# Patient Record
Sex: Female | Born: 1975 | Race: Black or African American | Hispanic: No | Marital: Single | Smoking: Never smoker
Health system: Southern US, Community
[De-identification: ages and names within clinical notes are randomized; demographics above are authoritative.]

## PROBLEM LIST (undated history)

## (undated) DIAGNOSIS — K4090 Unilateral inguinal hernia, without obstruction or gangrene, not specified as recurrent: Secondary | ICD-10-CM

## (undated) DIAGNOSIS — N159 Renal tubulo-interstitial disease, unspecified: Secondary | ICD-10-CM

## (undated) DIAGNOSIS — R51 Headache: Secondary | ICD-10-CM

## (undated) DIAGNOSIS — IMO0001 Reserved for inherently not codable concepts without codable children: Secondary | ICD-10-CM

## (undated) DIAGNOSIS — F419 Anxiety disorder, unspecified: Secondary | ICD-10-CM

## (undated) DIAGNOSIS — K219 Gastro-esophageal reflux disease without esophagitis: Secondary | ICD-10-CM

## (undated) DIAGNOSIS — J45909 Unspecified asthma, uncomplicated: Secondary | ICD-10-CM

## (undated) DIAGNOSIS — Z5189 Encounter for other specified aftercare: Secondary | ICD-10-CM

## (undated) DIAGNOSIS — G4730 Sleep apnea, unspecified: Secondary | ICD-10-CM

## (undated) DIAGNOSIS — D649 Anemia, unspecified: Secondary | ICD-10-CM

## (undated) DIAGNOSIS — R0602 Shortness of breath: Secondary | ICD-10-CM

## (undated) DIAGNOSIS — R7989 Other specified abnormal findings of blood chemistry: Secondary | ICD-10-CM

## (undated) DIAGNOSIS — F4310 Post-traumatic stress disorder, unspecified: Secondary | ICD-10-CM

## (undated) DIAGNOSIS — F32A Depression, unspecified: Secondary | ICD-10-CM

## (undated) DIAGNOSIS — J302 Other seasonal allergic rhinitis: Secondary | ICD-10-CM

## (undated) DIAGNOSIS — F329 Major depressive disorder, single episode, unspecified: Secondary | ICD-10-CM

## (undated) DIAGNOSIS — I10 Essential (primary) hypertension: Secondary | ICD-10-CM

## (undated) DIAGNOSIS — J189 Pneumonia, unspecified organism: Secondary | ICD-10-CM

## (undated) HISTORY — DX: Morbid (severe) obesity due to excess calories: E66.01

## (undated) HISTORY — DX: Essential (primary) hypertension: I10

## (undated) HISTORY — DX: Anemia, unspecified: D64.9

## (undated) HISTORY — DX: Other specified abnormal findings of blood chemistry: R79.89

## (undated) HISTORY — PX: CERVICAL CERCLAGE: SHX1329

## (undated) HISTORY — DX: Gastro-esophageal reflux disease without esophagitis: K21.9

---

## 2000-11-29 HISTORY — PX: TUBAL LIGATION: SHX77

## 2005-08-07 ENCOUNTER — Inpatient Hospital Stay (HOSPITAL_COMMUNITY): Admission: EM | Admit: 2005-08-07 | Discharge: 2005-08-09 | Payer: Self-pay | Admitting: Emergency Medicine

## 2005-09-10 ENCOUNTER — Emergency Department (HOSPITAL_COMMUNITY): Admission: EM | Admit: 2005-09-10 | Discharge: 2005-09-10 | Payer: Self-pay | Admitting: Emergency Medicine

## 2005-09-22 ENCOUNTER — Emergency Department (HOSPITAL_COMMUNITY): Admission: EM | Admit: 2005-09-22 | Discharge: 2005-09-22 | Payer: Self-pay | Admitting: Emergency Medicine

## 2005-10-05 ENCOUNTER — Emergency Department (HOSPITAL_COMMUNITY): Admission: EM | Admit: 2005-10-05 | Discharge: 2005-10-05 | Payer: Self-pay | Admitting: Emergency Medicine

## 2005-10-12 ENCOUNTER — Ambulatory Visit: Payer: Self-pay | Admitting: Pulmonary Disease

## 2005-10-12 ENCOUNTER — Inpatient Hospital Stay (HOSPITAL_COMMUNITY): Admission: EM | Admit: 2005-10-12 | Discharge: 2005-10-13 | Payer: Self-pay | Admitting: Emergency Medicine

## 2005-11-12 ENCOUNTER — Inpatient Hospital Stay (HOSPITAL_COMMUNITY): Admission: EM | Admit: 2005-11-12 | Discharge: 2005-11-14 | Payer: Self-pay | Admitting: Emergency Medicine

## 2005-12-16 ENCOUNTER — Emergency Department (HOSPITAL_COMMUNITY): Admission: EM | Admit: 2005-12-16 | Discharge: 2005-12-16 | Payer: Self-pay | Admitting: Emergency Medicine

## 2005-12-17 ENCOUNTER — Inpatient Hospital Stay (HOSPITAL_COMMUNITY): Admission: EM | Admit: 2005-12-17 | Discharge: 2005-12-19 | Payer: Self-pay | Admitting: Emergency Medicine

## 2006-01-10 ENCOUNTER — Ambulatory Visit: Payer: Self-pay | Admitting: Internal Medicine

## 2006-02-18 ENCOUNTER — Emergency Department (HOSPITAL_COMMUNITY): Admission: EM | Admit: 2006-02-18 | Discharge: 2006-02-18 | Payer: Self-pay | Admitting: Emergency Medicine

## 2006-02-26 ENCOUNTER — Emergency Department (HOSPITAL_COMMUNITY): Admission: EM | Admit: 2006-02-26 | Discharge: 2006-02-27 | Payer: Self-pay | Admitting: Emergency Medicine

## 2006-03-11 ENCOUNTER — Emergency Department (HOSPITAL_COMMUNITY): Admission: EM | Admit: 2006-03-11 | Discharge: 2006-03-11 | Payer: Self-pay | Admitting: Emergency Medicine

## 2006-03-12 ENCOUNTER — Ambulatory Visit: Payer: Self-pay | Admitting: Internal Medicine

## 2006-03-12 ENCOUNTER — Inpatient Hospital Stay (HOSPITAL_COMMUNITY): Admission: EM | Admit: 2006-03-12 | Discharge: 2006-03-14 | Payer: Self-pay | Admitting: Emergency Medicine

## 2006-04-16 ENCOUNTER — Emergency Department (HOSPITAL_COMMUNITY): Admission: EM | Admit: 2006-04-16 | Discharge: 2006-04-16 | Payer: Self-pay | Admitting: Emergency Medicine

## 2006-04-18 ENCOUNTER — Emergency Department (HOSPITAL_COMMUNITY): Admission: EM | Admit: 2006-04-18 | Discharge: 2006-04-18 | Payer: Self-pay | Admitting: Emergency Medicine

## 2006-05-03 ENCOUNTER — Encounter: Payer: Self-pay | Admitting: Emergency Medicine

## 2006-05-04 ENCOUNTER — Ambulatory Visit: Payer: Self-pay | Admitting: Critical Care Medicine

## 2006-05-04 ENCOUNTER — Inpatient Hospital Stay (HOSPITAL_COMMUNITY): Admission: EM | Admit: 2006-05-04 | Discharge: 2006-05-07 | Payer: Self-pay | Admitting: Emergency Medicine

## 2006-06-27 ENCOUNTER — Emergency Department (HOSPITAL_COMMUNITY): Admission: EM | Admit: 2006-06-27 | Discharge: 2006-06-27 | Payer: Self-pay | Admitting: Emergency Medicine

## 2006-06-28 ENCOUNTER — Inpatient Hospital Stay (HOSPITAL_COMMUNITY): Admission: EM | Admit: 2006-06-28 | Discharge: 2006-06-30 | Payer: Self-pay | Admitting: Emergency Medicine

## 2006-06-28 ENCOUNTER — Ambulatory Visit: Payer: Self-pay | Admitting: Internal Medicine

## 2006-07-07 ENCOUNTER — Ambulatory Visit: Payer: Self-pay | Admitting: Internal Medicine

## 2006-09-16 ENCOUNTER — Emergency Department (HOSPITAL_COMMUNITY): Admission: EM | Admit: 2006-09-16 | Discharge: 2006-09-16 | Payer: Self-pay | Admitting: Emergency Medicine

## 2006-09-17 ENCOUNTER — Observation Stay (HOSPITAL_COMMUNITY): Admission: EM | Admit: 2006-09-17 | Discharge: 2006-09-19 | Payer: Self-pay | Admitting: Emergency Medicine

## 2006-10-17 ENCOUNTER — Ambulatory Visit: Payer: Self-pay | Admitting: Internal Medicine

## 2006-10-17 ENCOUNTER — Inpatient Hospital Stay (HOSPITAL_COMMUNITY): Admission: EM | Admit: 2006-10-17 | Discharge: 2006-10-19 | Payer: Self-pay | Admitting: Emergency Medicine

## 2006-11-28 ENCOUNTER — Emergency Department (HOSPITAL_COMMUNITY): Admission: EM | Admit: 2006-11-28 | Discharge: 2006-11-28 | Payer: Self-pay | Admitting: Emergency Medicine

## 2006-11-29 DIAGNOSIS — N159 Renal tubulo-interstitial disease, unspecified: Secondary | ICD-10-CM

## 2006-11-29 HISTORY — DX: Renal tubulo-interstitial disease, unspecified: N15.9

## 2006-12-30 ENCOUNTER — Ambulatory Visit: Payer: Self-pay | Admitting: Internal Medicine

## 2007-01-01 ENCOUNTER — Emergency Department (HOSPITAL_COMMUNITY): Admission: EM | Admit: 2007-01-01 | Discharge: 2007-01-01 | Payer: Self-pay | Admitting: Emergency Medicine

## 2007-01-13 ENCOUNTER — Emergency Department (HOSPITAL_COMMUNITY): Admission: EM | Admit: 2007-01-13 | Discharge: 2007-01-13 | Payer: Self-pay | Admitting: Emergency Medicine

## 2007-02-25 ENCOUNTER — Emergency Department (HOSPITAL_COMMUNITY): Admission: EM | Admit: 2007-02-25 | Discharge: 2007-02-25 | Payer: Self-pay | Admitting: Emergency Medicine

## 2007-06-26 ENCOUNTER — Emergency Department (HOSPITAL_COMMUNITY): Admission: EM | Admit: 2007-06-26 | Discharge: 2007-06-26 | Payer: Self-pay | Admitting: Emergency Medicine

## 2007-06-27 ENCOUNTER — Emergency Department (HOSPITAL_COMMUNITY): Admission: EM | Admit: 2007-06-27 | Discharge: 2007-06-27 | Payer: Self-pay | Admitting: Emergency Medicine

## 2007-09-01 ENCOUNTER — Emergency Department (HOSPITAL_COMMUNITY): Admission: EM | Admit: 2007-09-01 | Discharge: 2007-09-01 | Payer: Self-pay | Admitting: Emergency Medicine

## 2007-11-01 ENCOUNTER — Emergency Department (HOSPITAL_COMMUNITY): Admission: EM | Admit: 2007-11-01 | Discharge: 2007-11-01 | Payer: Self-pay | Admitting: Emergency Medicine

## 2007-11-07 ENCOUNTER — Emergency Department (HOSPITAL_COMMUNITY): Admission: EM | Admit: 2007-11-07 | Discharge: 2007-11-07 | Payer: Self-pay | Admitting: Emergency Medicine

## 2007-11-08 ENCOUNTER — Emergency Department (HOSPITAL_COMMUNITY): Admission: EM | Admit: 2007-11-08 | Discharge: 2007-11-08 | Payer: Self-pay | Admitting: Emergency Medicine

## 2007-11-27 ENCOUNTER — Emergency Department (HOSPITAL_COMMUNITY): Admission: EM | Admit: 2007-11-27 | Discharge: 2007-11-27 | Payer: Self-pay | Admitting: Emergency Medicine

## 2008-04-09 ENCOUNTER — Emergency Department (HOSPITAL_COMMUNITY): Admission: EM | Admit: 2008-04-09 | Discharge: 2008-04-09 | Payer: Self-pay | Admitting: Emergency Medicine

## 2008-04-10 ENCOUNTER — Emergency Department (HOSPITAL_COMMUNITY): Admission: EM | Admit: 2008-04-10 | Discharge: 2008-04-10 | Payer: Self-pay | Admitting: Emergency Medicine

## 2008-04-28 ENCOUNTER — Emergency Department (HOSPITAL_COMMUNITY): Admission: EM | Admit: 2008-04-28 | Discharge: 2008-04-28 | Payer: Self-pay | Admitting: *Deleted

## 2008-04-29 ENCOUNTER — Emergency Department (HOSPITAL_COMMUNITY): Admission: EM | Admit: 2008-04-29 | Discharge: 2008-04-29 | Payer: Self-pay | Admitting: Emergency Medicine

## 2008-05-18 ENCOUNTER — Emergency Department (HOSPITAL_COMMUNITY): Admission: EM | Admit: 2008-05-18 | Discharge: 2008-05-18 | Payer: Self-pay | Admitting: Emergency Medicine

## 2008-07-08 ENCOUNTER — Encounter: Admission: RE | Admit: 2008-07-08 | Discharge: 2008-07-08 | Payer: Self-pay | Admitting: Otolaryngology

## 2008-07-19 ENCOUNTER — Ambulatory Visit (HOSPITAL_BASED_OUTPATIENT_CLINIC_OR_DEPARTMENT_OTHER): Admission: RE | Admit: 2008-07-19 | Discharge: 2008-07-19 | Payer: Self-pay | Admitting: Otolaryngology

## 2008-10-14 ENCOUNTER — Emergency Department (HOSPITAL_COMMUNITY): Admission: EM | Admit: 2008-10-14 | Discharge: 2008-10-14 | Payer: Self-pay | Admitting: Emergency Medicine

## 2008-10-20 ENCOUNTER — Emergency Department (HOSPITAL_COMMUNITY): Admission: EM | Admit: 2008-10-20 | Discharge: 2008-10-20 | Payer: Self-pay | Admitting: Emergency Medicine

## 2008-12-07 ENCOUNTER — Emergency Department (HOSPITAL_COMMUNITY): Admission: EM | Admit: 2008-12-07 | Discharge: 2008-12-07 | Payer: Self-pay | Admitting: Emergency Medicine

## 2008-12-13 ENCOUNTER — Emergency Department (HOSPITAL_COMMUNITY): Admission: EM | Admit: 2008-12-13 | Discharge: 2008-12-13 | Payer: Self-pay | Admitting: Emergency Medicine

## 2009-02-10 ENCOUNTER — Ambulatory Visit: Payer: Self-pay | Admitting: Internal Medicine

## 2009-02-17 ENCOUNTER — Emergency Department (HOSPITAL_COMMUNITY): Admission: EM | Admit: 2009-02-17 | Discharge: 2009-02-17 | Payer: Self-pay | Admitting: Emergency Medicine

## 2009-02-18 ENCOUNTER — Inpatient Hospital Stay (HOSPITAL_COMMUNITY): Admission: EM | Admit: 2009-02-18 | Discharge: 2009-02-21 | Payer: Self-pay | Admitting: Emergency Medicine

## 2009-02-18 ENCOUNTER — Ambulatory Visit: Payer: Self-pay | Admitting: Cardiovascular Disease

## 2009-02-19 ENCOUNTER — Encounter (INDEPENDENT_AMBULATORY_CARE_PROVIDER_SITE_OTHER): Payer: Self-pay | Admitting: Internal Medicine

## 2009-03-23 ENCOUNTER — Emergency Department (HOSPITAL_COMMUNITY): Admission: EM | Admit: 2009-03-23 | Discharge: 2009-03-23 | Payer: Self-pay | Admitting: Emergency Medicine

## 2009-03-24 ENCOUNTER — Inpatient Hospital Stay (HOSPITAL_COMMUNITY): Admission: EM | Admit: 2009-03-24 | Discharge: 2009-03-25 | Payer: Self-pay | Admitting: Emergency Medicine

## 2009-04-06 ENCOUNTER — Inpatient Hospital Stay (HOSPITAL_COMMUNITY): Admission: EM | Admit: 2009-04-06 | Discharge: 2009-04-09 | Payer: Self-pay | Admitting: Emergency Medicine

## 2009-05-20 ENCOUNTER — Encounter: Payer: Self-pay | Admitting: Emergency Medicine

## 2009-05-20 ENCOUNTER — Ambulatory Visit: Payer: Self-pay | Admitting: Interventional Radiology

## 2009-05-20 ENCOUNTER — Observation Stay (HOSPITAL_COMMUNITY): Admission: EM | Admit: 2009-05-20 | Discharge: 2009-05-21 | Payer: Self-pay | Admitting: Internal Medicine

## 2009-06-27 ENCOUNTER — Inpatient Hospital Stay (HOSPITAL_COMMUNITY): Admission: EM | Admit: 2009-06-27 | Discharge: 2009-06-30 | Payer: Self-pay | Admitting: Emergency Medicine

## 2009-09-06 ENCOUNTER — Emergency Department (HOSPITAL_COMMUNITY): Admission: EM | Admit: 2009-09-06 | Discharge: 2009-09-06 | Payer: Self-pay | Admitting: Emergency Medicine

## 2009-09-07 ENCOUNTER — Inpatient Hospital Stay (HOSPITAL_COMMUNITY): Admission: EM | Admit: 2009-09-07 | Discharge: 2009-09-09 | Payer: Self-pay | Admitting: Emergency Medicine

## 2010-11-07 ENCOUNTER — Emergency Department (HOSPITAL_COMMUNITY)
Admission: EM | Admit: 2010-11-07 | Discharge: 2010-11-07 | Payer: Self-pay | Source: Home / Self Care | Admitting: Emergency Medicine

## 2010-11-08 ENCOUNTER — Observation Stay (HOSPITAL_COMMUNITY)
Admission: EM | Admit: 2010-11-08 | Discharge: 2010-11-10 | Payer: Self-pay | Source: Home / Self Care | Attending: Internal Medicine | Admitting: Internal Medicine

## 2010-12-06 IMAGING — CR DG CHEST 2V
2 series · 2 of 2 positions shown · non-contrast
Comparison: 12/13/2008

CLINICAL DATA: Short of breath.  Cough.  Wheezing.  Asthma.

CHEST - 2 VIEW

[w chest pa]
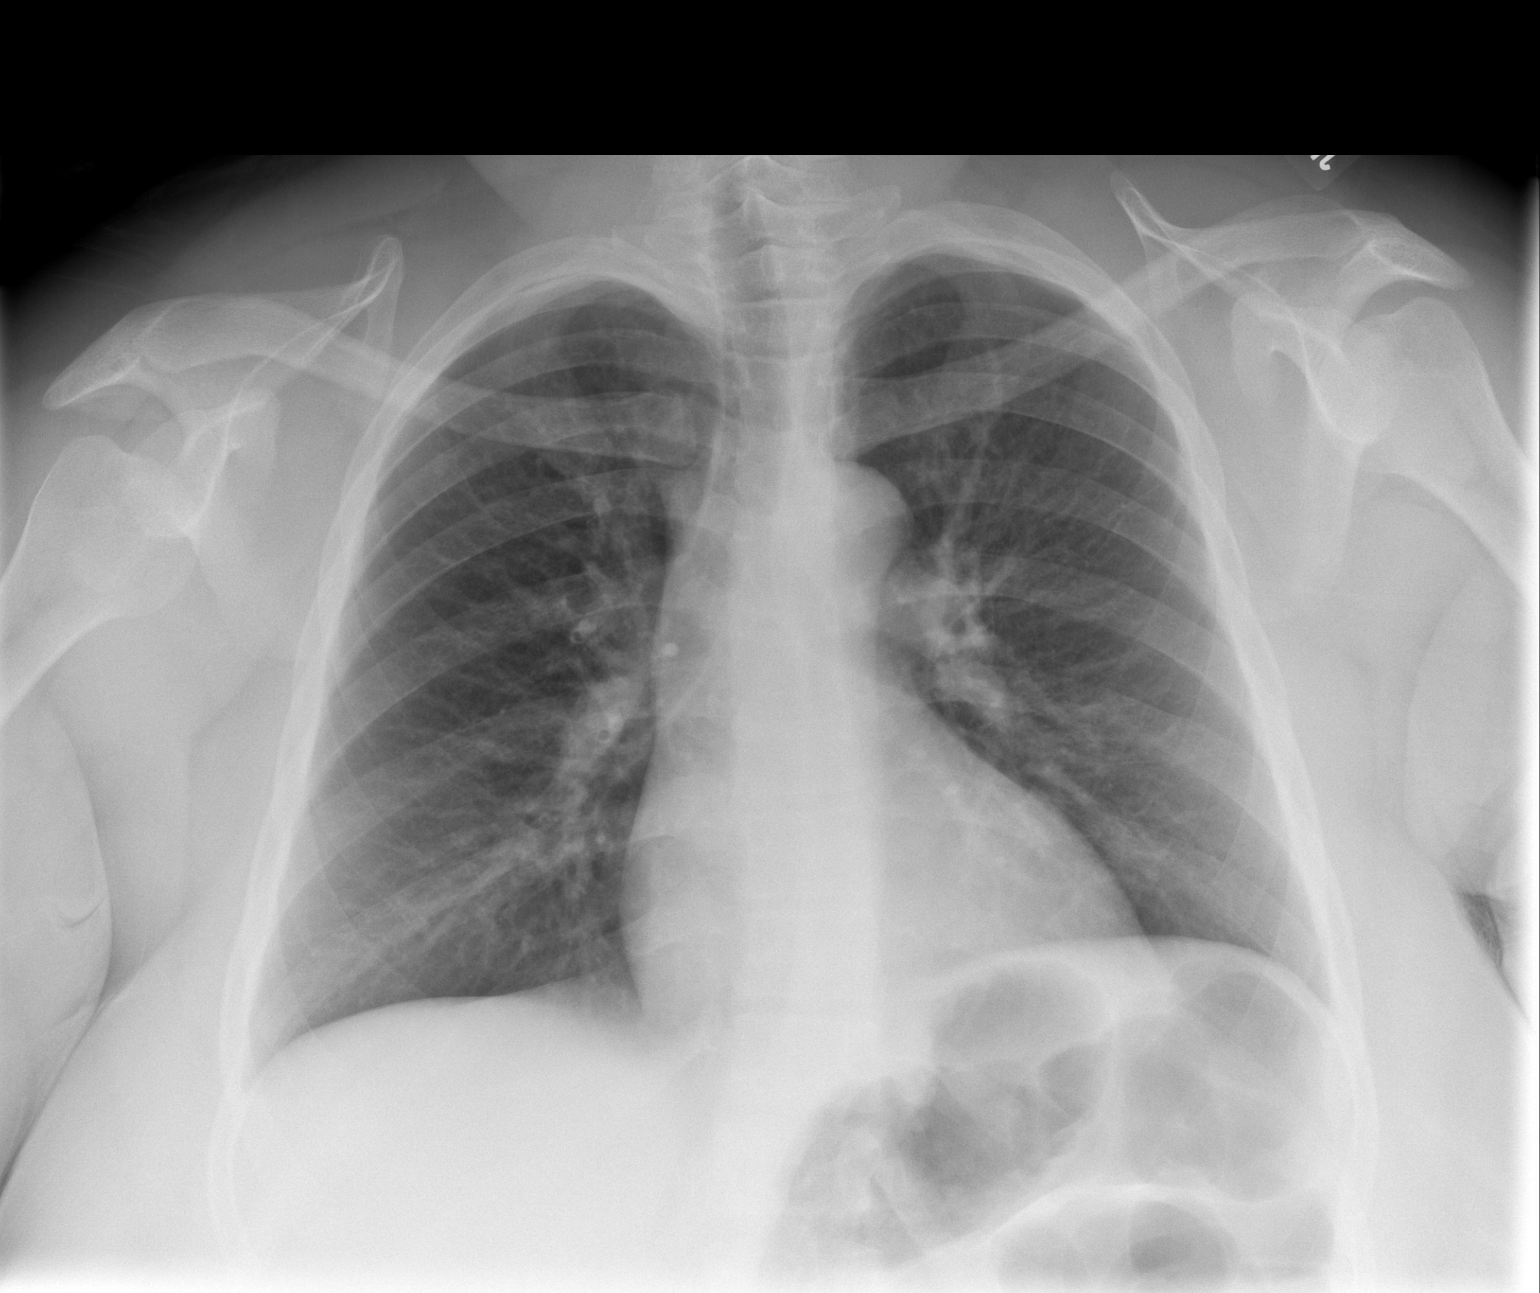

[w chest lat]
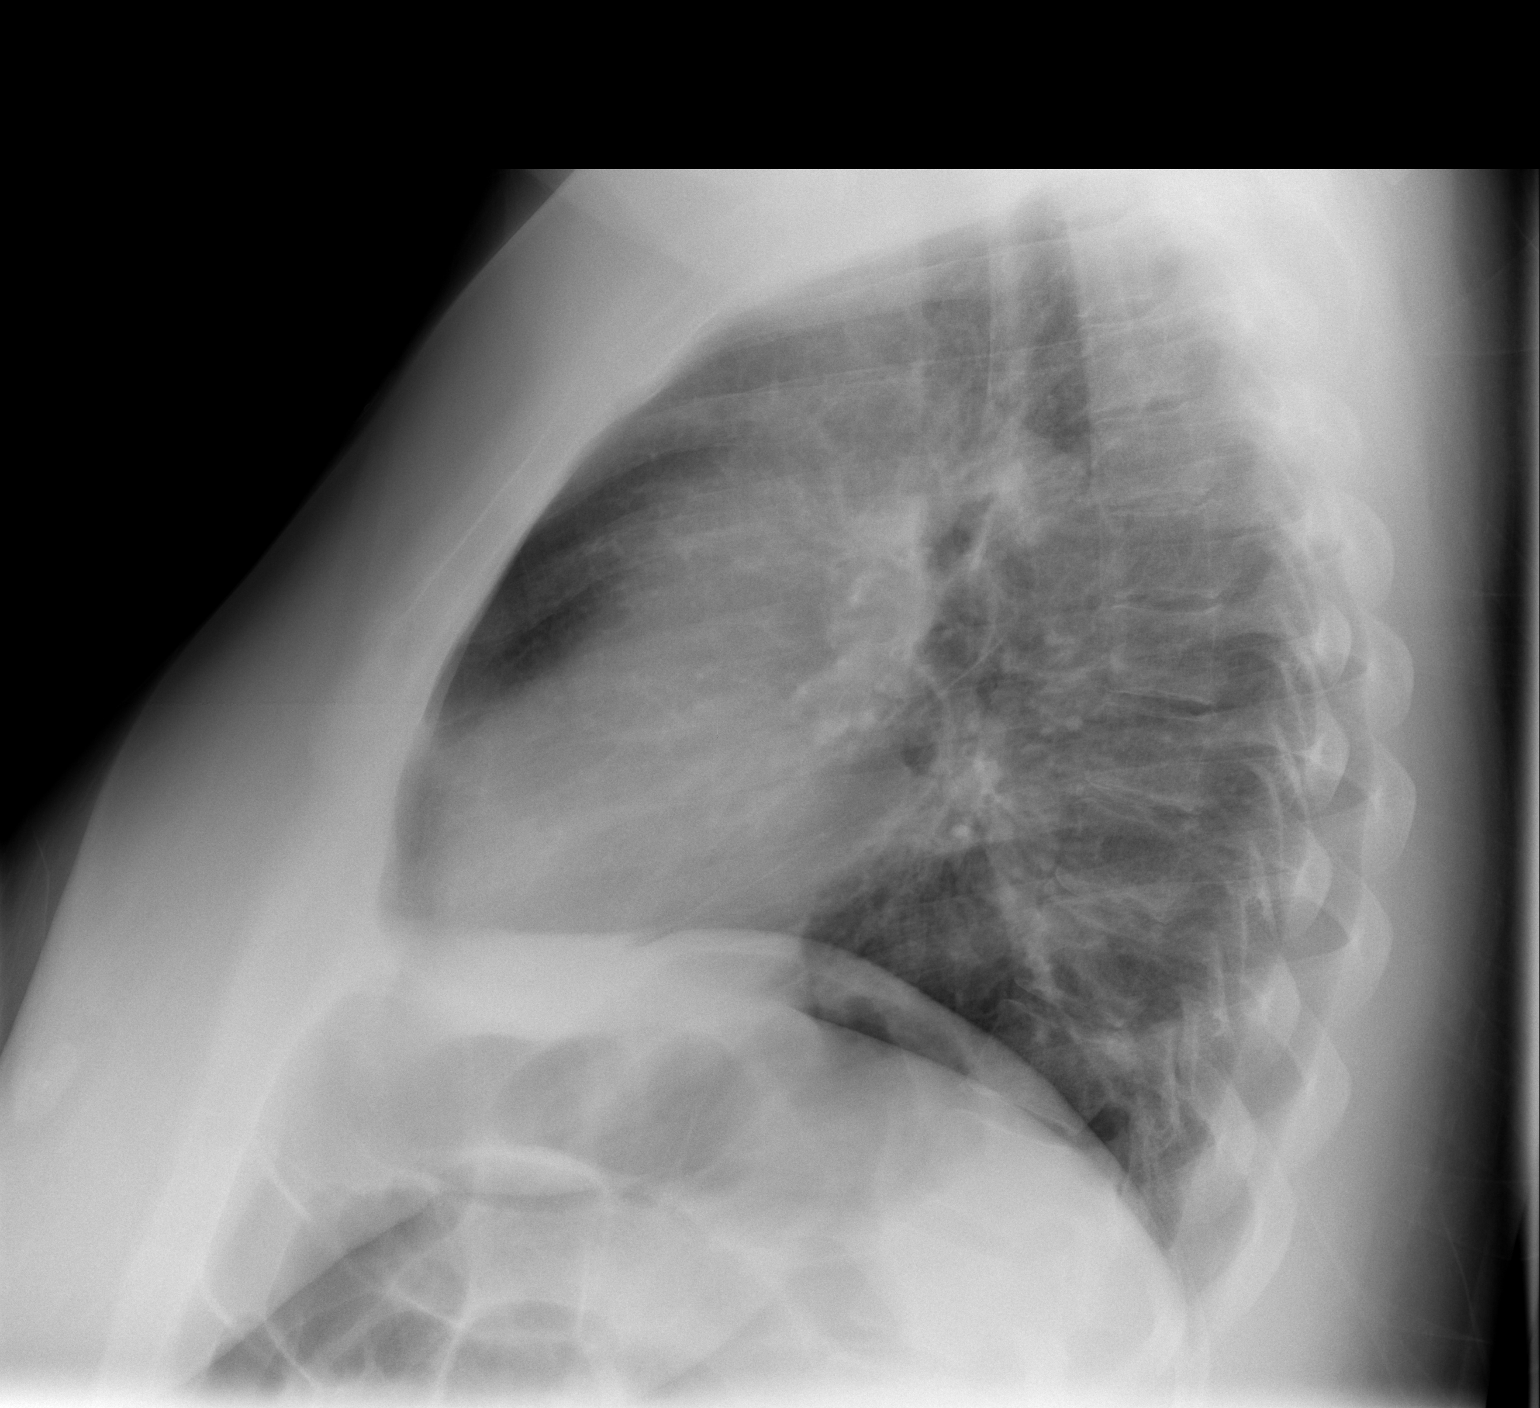

[2 of 2 positions shown; findings below may reference images not displayed]

FINDINGS: The heart size and mediastinal contours are within normal
limits.  Both lungs are clear.  The visualized skeletal structures
are unremarkable.
IMPRESSION: No active cardiopulmonary disease.

## 2010-12-21 ENCOUNTER — Encounter: Payer: Self-pay | Admitting: Otolaryngology

## 2010-12-27 ENCOUNTER — Emergency Department (HOSPITAL_COMMUNITY)
Admission: EM | Admit: 2010-12-27 | Discharge: 2010-12-27 | Payer: Self-pay | Source: Home / Self Care | Admitting: Emergency Medicine

## 2011-01-06 ENCOUNTER — Emergency Department (HOSPITAL_COMMUNITY)
Admission: EM | Admit: 2011-01-06 | Discharge: 2011-01-06 | Disposition: A | Discharge status: Home / Self Care | Payer: Medicare Other | Attending: Emergency Medicine | Admitting: Emergency Medicine

## 2011-01-06 DIAGNOSIS — J45901 Unspecified asthma with (acute) exacerbation: Secondary | ICD-10-CM | POA: Insufficient documentation

## 2011-01-06 DIAGNOSIS — E119 Type 2 diabetes mellitus without complications: Secondary | ICD-10-CM | POA: Insufficient documentation

## 2011-02-05 ENCOUNTER — Observation Stay (HOSPITAL_COMMUNITY)
Admission: EM | Admit: 2011-02-05 | Discharge: 2011-02-07 | Disposition: A | Discharge status: Home / Self Care | Payer: Medicare Other | Attending: Internal Medicine | Admitting: Internal Medicine

## 2011-02-05 ENCOUNTER — Emergency Department (HOSPITAL_COMMUNITY): Payer: Medicare Other

## 2011-02-05 DIAGNOSIS — I10 Essential (primary) hypertension: Secondary | ICD-10-CM | POA: Insufficient documentation

## 2011-02-05 DIAGNOSIS — D509 Iron deficiency anemia, unspecified: Secondary | ICD-10-CM | POA: Insufficient documentation

## 2011-02-05 DIAGNOSIS — R0789 Other chest pain: Secondary | ICD-10-CM | POA: Insufficient documentation

## 2011-02-05 DIAGNOSIS — E876 Hypokalemia: Secondary | ICD-10-CM | POA: Insufficient documentation

## 2011-02-05 DIAGNOSIS — R0902 Hypoxemia: Secondary | ICD-10-CM | POA: Insufficient documentation

## 2011-02-05 DIAGNOSIS — J45901 Unspecified asthma with (acute) exacerbation: Secondary | ICD-10-CM | POA: Insufficient documentation

## 2011-02-05 DIAGNOSIS — J209 Acute bronchitis, unspecified: Principal | ICD-10-CM | POA: Insufficient documentation

## 2011-02-05 DIAGNOSIS — E119 Type 2 diabetes mellitus without complications: Secondary | ICD-10-CM | POA: Insufficient documentation

## 2011-02-05 DIAGNOSIS — R0602 Shortness of breath: Secondary | ICD-10-CM | POA: Insufficient documentation

## 2011-02-05 LAB — CBC
HCT: 32.8 % — ABNORMAL LOW (ref 36.0–46.0)
Hemoglobin: 9.6 g/dL — ABNORMAL LOW (ref 12.0–15.0)
MCHC: 29.3 g/dL — ABNORMAL LOW (ref 30.0–36.0)
RBC: 4.9 MIL/uL (ref 3.87–5.11)

## 2011-02-05 LAB — DIFFERENTIAL
Basophils Absolute: 0 10*3/uL (ref 0.0–0.1)
Lymphocytes Relative: 20 % (ref 12–46)
Monocytes Relative: 9 % (ref 3–12)
Neutro Abs: 5.2 10*3/uL (ref 1.7–7.7)
Neutrophils Relative %: 67 % (ref 43–77)

## 2011-02-05 LAB — BASIC METABOLIC PANEL
CO2: 24 mEq/L (ref 19–32)
Calcium: 8.4 mg/dL (ref 8.4–10.5)
Chloride: 104 mEq/L (ref 96–112)
Glucose, Bld: 151 mg/dL — ABNORMAL HIGH (ref 70–99)
Potassium: 2.8 mEq/L — ABNORMAL LOW (ref 3.5–5.1)
Sodium: 136 mEq/L (ref 135–145)

## 2011-02-06 LAB — BASIC METABOLIC PANEL
CO2: 20 mEq/L (ref 19–32)
Chloride: 107 mEq/L (ref 96–112)
GFR calc Af Amer: >60 mL/min (ref >60)
Potassium: 3.5 mEq/L (ref 3.5–5.1)
Sodium: 138 mEq/L (ref 135–145)

## 2011-02-06 LAB — GLUCOSE, CAPILLARY
Glucose-Capillary: 253 mg/dL — ABNORMAL HIGH (ref 70–99)
Glucose-Capillary: 205 mg/dL — ABNORMAL HIGH (ref 70–99)
Glucose-Capillary: 248 mg/dL — ABNORMAL HIGH (ref 70–99)

## 2011-02-07 LAB — BASIC METABOLIC PANEL
CO2: 25 mEq/L (ref 19–32)
Chloride: 109 mEq/L (ref 96–112)
GFR calc Af Amer: >60 mL/min (ref >60)
Potassium: 3.9 mEq/L (ref 3.5–5.1)
Sodium: 139 mEq/L (ref 135–145)

## 2011-02-07 LAB — GLUCOSE, CAPILLARY
Glucose-Capillary: 124 mg/dL — ABNORMAL HIGH (ref 70–99)
Glucose-Capillary: 134 mg/dL — ABNORMAL HIGH (ref 70–99)

## 2011-02-08 LAB — PHOSPHORUS: Phosphorus: 2.4 mg/dL (ref 2.3–4.6)

## 2011-02-08 LAB — CBC
HCT: 30.4 % — ABNORMAL LOW (ref 36.0–46.0)
Hemoglobin: 9.1 g/dL — ABNORMAL LOW (ref 12.0–15.0)
Hemoglobin: 9.3 g/dL — ABNORMAL LOW (ref 12.0–15.0)
MCH: 20.7 pg — ABNORMAL LOW (ref 26.0–34.0)
MCHC: 29.1 g/dL — ABNORMAL LOW (ref 30.0–36.0)
MCHC: 29.9 g/dL — ABNORMAL LOW (ref 30.0–36.0)
MCV: 71.3 fL — ABNORMAL LOW (ref 78.0–100.0)
RBC: 4.49 MIL/uL (ref 3.87–5.11)
WBC: 8.1 10*3/uL (ref 4.0–10.5)

## 2011-02-08 LAB — BASIC METABOLIC PANEL
BUN: 8 mg/dL (ref 6–23)
BUN: 9 mg/dL (ref 6–23)
CO2: 26 mEq/L (ref 19–32)
Calcium: 8.8 mg/dL (ref 8.4–10.5)
Calcium: 9.4 mg/dL (ref 8.4–10.5)
Chloride: 105 mEq/L (ref 96–112)
Creatinine, Ser: 0.76 mg/dL (ref 0.4–1.2)
GFR calc Af Amer: >60 mL/min (ref >60)
GFR calc non Af Amer: >60 mL/min (ref >60)
GFR calc non Af Amer: >60 mL/min (ref >60)
Glucose, Bld: 228 mg/dL — ABNORMAL HIGH (ref 70–99)
Glucose, Bld: 290 mg/dL — ABNORMAL HIGH (ref 70–99)
Potassium: 2.9 mEq/L — ABNORMAL LOW (ref 3.5–5.1)
Potassium: 4.2 mEq/L (ref 3.5–5.1)
Potassium: 4.6 mEq/L (ref 3.5–5.1)
Sodium: 141 mEq/L (ref 135–145)

## 2011-02-08 LAB — IRON AND TIBC: UIBC: 373 ug/dL

## 2011-02-08 LAB — DIFFERENTIAL
Basophils Absolute: 0 10*3/uL (ref 0.0–0.1)
Basophils Relative: 0 % (ref 0–1)
Eosinophils Absolute: 0.1 10*3/uL (ref 0.0–0.7)
Lymphocytes Relative: 6 % — ABNORMAL LOW (ref 12–46)
Monocytes Relative: 2 % — ABNORMAL LOW (ref 3–12)
Neutro Abs: 7.3 10*3/uL (ref 1.7–7.7)
Neutrophils Relative %: 91 % — ABNORMAL HIGH (ref 43–77)

## 2011-02-08 LAB — GLUCOSE, CAPILLARY: Glucose-Capillary: 295 mg/dL — ABNORMAL HIGH (ref 70–99)

## 2011-02-08 LAB — TSH: TSH: 0.289 u[IU]/mL — ABNORMAL LOW (ref 0.350–4.500)

## 2011-02-08 LAB — FERRITIN: Ferritin: 3 ng/mL — ABNORMAL LOW (ref 10–291)

## 2011-02-08 LAB — POCT PREGNANCY, URINE: Preg Test, Ur: NEGATIVE

## 2011-02-08 LAB — HEMOGLOBIN A1C: Hgb A1c MFr Bld: 6.9 % — ABNORMAL HIGH (ref <5.7)

## 2011-02-08 LAB — MAGNESIUM: Magnesium: 1.8 mg/dL (ref 1.5–2.5)

## 2011-02-08 LAB — FOLATE: Folate: 11.2 ng/mL

## 2011-02-08 LAB — T4, FREE: Free T4: 0.82 ng/dL (ref 0.80–1.80)

## 2011-02-23 ENCOUNTER — Observation Stay (HOSPITAL_COMMUNITY)
Admission: EM | Admit: 2011-02-23 | Discharge: 2011-02-23 | Disposition: A | Discharge status: Home / Self Care | Payer: Medicare Other | Attending: Emergency Medicine | Admitting: Emergency Medicine

## 2011-02-23 DIAGNOSIS — J45909 Unspecified asthma, uncomplicated: Principal | ICD-10-CM | POA: Insufficient documentation

## 2011-03-04 LAB — CULTURE, BLOOD (ROUTINE X 2)
Culture: NO GROWTH
Culture: NO GROWTH

## 2011-03-04 LAB — CBC
HCT: 34.7 % — ABNORMAL LOW (ref 36.0–46.0)
HCT: 35.6 % — ABNORMAL LOW (ref 36.0–46.0)
Hemoglobin: 11.8 g/dL — ABNORMAL LOW (ref 12.0–15.0)
Hemoglobin: 12 g/dL (ref 12.0–15.0)
MCHC: 33.8 g/dL (ref 30.0–36.0)
MCHC: 34 g/dL (ref 30.0–36.0)
MCV: 81.5 fL (ref 78.0–100.0)
MCV: 82.6 fL (ref 78.0–100.0)
MCV: 84.4 fL (ref 78.0–100.0)
RBC: 4.17 MIL/uL (ref 3.87–5.11)
RBC: 4.25 MIL/uL (ref 3.87–5.11)
RDW: 19.8 % — ABNORMAL HIGH (ref 11.5–15.5)
RDW: 19.9 % — ABNORMAL HIGH (ref 11.5–15.5)
WBC: 11.4 10*3/uL — ABNORMAL HIGH (ref 4.0–10.5)

## 2011-03-04 LAB — BLOOD GAS, ARTERIAL
Bicarbonate: 20.6 mEq/L (ref 20.0–24.0)
O2 Saturation: 94.7 %
Patient temperature: 98.6
TCO2: 18.6 mmol/L (ref 0–100)

## 2011-03-04 LAB — URINALYSIS, MICROSCOPIC ONLY
Bilirubin Urine: NEGATIVE
Nitrite: NEGATIVE
Protein, ur: NEGATIVE mg/dL
Urobilinogen, UA: 0.2 mg/dL (ref 0.0–1.0)

## 2011-03-04 LAB — GLUCOSE, CAPILLARY
Glucose-Capillary: 106 mg/dL — ABNORMAL HIGH (ref 70–99)
Glucose-Capillary: 155 mg/dL — ABNORMAL HIGH (ref 70–99)

## 2011-03-04 LAB — EXPECTORATED SPUTUM ASSESSMENT W GRAM STAIN, RFLX TO RESP C

## 2011-03-04 LAB — LIPID PANEL
Cholesterol: 171 mg/dL (ref 0–200)
HDL: 69 mg/dL (ref >39)
LDL Cholesterol: 94 mg/dL (ref 0–99)
Total CHOL/HDL Ratio: 2.5 RATIO
Triglycerides: 42 mg/dL (ref <150)

## 2011-03-04 LAB — APTT: aPTT: 28 seconds (ref 24–37)

## 2011-03-04 LAB — BASIC METABOLIC PANEL
CO2: 22 mEq/L (ref 19–32)
Calcium: 8.9 mg/dL (ref 8.4–10.5)
Chloride: 108 mEq/L (ref 96–112)
Chloride: 108 mEq/L (ref 96–112)
Creatinine, Ser: 0.71 mg/dL (ref 0.4–1.2)
GFR calc Af Amer: >60 mL/min (ref >60)
GFR calc Af Amer: >60 mL/min (ref >60)
GFR calc non Af Amer: >60 mL/min (ref >60)
Glucose, Bld: 148 mg/dL — ABNORMAL HIGH (ref 70–99)
Sodium: 139 mEq/L (ref 135–145)

## 2011-03-04 LAB — POCT I-STAT, CHEM 8
Calcium, Ion: 1.1 mmol/L — ABNORMAL LOW (ref 1.12–1.32)
Chloride: 108 mEq/L (ref 96–112)
Glucose, Bld: 148 mg/dL — ABNORMAL HIGH (ref 70–99)
HCT: 38 % (ref 36.0–46.0)
TCO2: 21 mmol/L (ref 0–100)

## 2011-03-04 LAB — COMPREHENSIVE METABOLIC PANEL
Alkaline Phosphatase: 72 U/L (ref 39–117)
BUN: 6 mg/dL (ref 6–23)
Calcium: 8.8 mg/dL (ref 8.4–10.5)
Glucose, Bld: 235 mg/dL — ABNORMAL HIGH (ref 70–99)
Total Protein: 6.8 g/dL (ref 6.0–8.3)

## 2011-03-04 LAB — PHOSPHORUS
Phosphorus: 1.7 mg/dL — ABNORMAL LOW (ref 2.3–4.6)
Phosphorus: 2.1 mg/dL — ABNORMAL LOW (ref 2.3–4.6)

## 2011-03-04 LAB — DIFFERENTIAL
Basophils Absolute: 0.1 10*3/uL (ref 0.0–0.1)
Basophils Relative: 1 % (ref 0–1)
Eosinophils Absolute: 0 10*3/uL (ref 0.0–0.7)
Eosinophils Relative: 0 % (ref 0–5)
Monocytes Absolute: 0.1 10*3/uL (ref 0.1–1.0)
Monocytes Relative: 1 % — ABNORMAL LOW (ref 3–12)
Neutro Abs: 11.7 10*3/uL — ABNORMAL HIGH (ref 1.7–7.7)

## 2011-03-04 LAB — CULTURE, RESPIRATORY W GRAM STAIN

## 2011-03-04 LAB — MAGNESIUM: Magnesium: 2.1 mg/dL (ref 1.5–2.5)

## 2011-03-06 LAB — CBC
HCT: 32.6 % — ABNORMAL LOW (ref 36.0–46.0)
Hemoglobin: 10.5 g/dL — ABNORMAL LOW (ref 12.0–15.0)
MCHC: 32.3 g/dL (ref 30.0–36.0)
Platelets: 263 10*3/uL (ref 150–400)
RBC: 4.26 MIL/uL (ref 3.87–5.11)
RBC: 4.28 MIL/uL (ref 3.87–5.11)
RDW: 16.8 % — ABNORMAL HIGH (ref 11.5–15.5)
WBC: 16.7 10*3/uL — ABNORMAL HIGH (ref 4.0–10.5)

## 2011-03-06 LAB — GLUCOSE, CAPILLARY
Glucose-Capillary: 196 mg/dL — ABNORMAL HIGH (ref 70–99)
Glucose-Capillary: 249 mg/dL — ABNORMAL HIGH (ref 70–99)

## 2011-03-06 LAB — BASIC METABOLIC PANEL
BUN: 6 mg/dL (ref 6–23)
GFR calc Af Amer: >60 mL/min (ref >60)
GFR calc non Af Amer: >60 mL/min (ref >60)
Potassium: 3.8 mEq/L (ref 3.5–5.1)
Sodium: 136 mEq/L (ref 135–145)

## 2011-03-07 ENCOUNTER — Emergency Department (HOSPITAL_COMMUNITY)
Admission: EM | Admit: 2011-03-07 | Discharge: 2011-03-07 | Disposition: A | Discharge status: Home / Self Care | Payer: Medicare Other | Attending: Emergency Medicine | Admitting: Emergency Medicine

## 2011-03-07 DIAGNOSIS — R059 Cough, unspecified: Secondary | ICD-10-CM | POA: Insufficient documentation

## 2011-03-07 DIAGNOSIS — R05 Cough: Secondary | ICD-10-CM | POA: Insufficient documentation

## 2011-03-07 DIAGNOSIS — I10 Essential (primary) hypertension: Secondary | ICD-10-CM | POA: Insufficient documentation

## 2011-03-07 DIAGNOSIS — J45901 Unspecified asthma with (acute) exacerbation: Secondary | ICD-10-CM | POA: Insufficient documentation

## 2011-03-07 DIAGNOSIS — E119 Type 2 diabetes mellitus without complications: Secondary | ICD-10-CM | POA: Insufficient documentation

## 2011-03-07 DIAGNOSIS — K219 Gastro-esophageal reflux disease without esophagitis: Secondary | ICD-10-CM | POA: Insufficient documentation

## 2011-03-07 LAB — CBC
HCT: 32.7 % — ABNORMAL LOW (ref 36.0–46.0)
MCV: 76 fL — ABNORMAL LOW (ref 78.0–100.0)
MCV: 76.4 fL — ABNORMAL LOW (ref 78.0–100.0)
Platelets: 330 10*3/uL (ref 150–400)
RBC: 4.31 MIL/uL (ref 3.87–5.11)
RDW: 16.8 % — ABNORMAL HIGH (ref 11.5–15.5)
WBC: 12.8 10*3/uL — ABNORMAL HIGH (ref 4.0–10.5)
WBC: 7.5 10*3/uL (ref 4.0–10.5)

## 2011-03-07 LAB — BLOOD GAS, ARTERIAL
Acid-base deficit: 0.2 mmol/L (ref 0.0–2.0)
Bicarbonate: 23.6 mEq/L (ref 20.0–24.0)
Drawn by: 246861
Drawn by: 307971
FIO2: 0.4 %
FIO2: 0.5 %
O2 Saturation: 98.4 %
PEEP: 5 cmH2O
Patient temperature: 98.6
Pressure support: 5 cmH2O
TCO2: 25.5 mmol/L (ref 0–100)
pCO2 arterial: 41.8 mmHg (ref 35.0–45.0)
pCO2 arterial: 43.9 mmHg (ref 35.0–45.0)
pH, Arterial: 7.381 (ref 7.350–7.400)
pO2, Arterial: 117 mmHg — ABNORMAL HIGH (ref 80.0–100.0)

## 2011-03-07 LAB — DIFFERENTIAL
Eosinophils Absolute: 0.5 10*3/uL (ref 0.0–0.7)
Eosinophils Relative: 7 % — ABNORMAL HIGH (ref 0–5)
Lymphs Abs: 0.6 10*3/uL — ABNORMAL LOW (ref 0.7–4.0)
Monocytes Relative: 1 % — ABNORMAL LOW (ref 3–12)

## 2011-03-07 LAB — BASIC METABOLIC PANEL
BUN: 7 mg/dL (ref 6–23)
Creatinine, Ser: 0.79 mg/dL (ref 0.4–1.2)
GFR calc non Af Amer: >60 mL/min (ref >60)
Glucose, Bld: 196 mg/dL — ABNORMAL HIGH (ref 70–99)

## 2011-03-07 LAB — RETICULOCYTES
RBC.: 4.76 MIL/uL (ref 3.87–5.11)
Retic Count, Absolute: 100 10*3/uL (ref 19.0–186.0)
Retic Ct Pct: 2.1 % (ref 0.4–3.1)

## 2011-03-07 LAB — HEMOGLOBIN A1C
Hgb A1c MFr Bld: 6.1 % (ref 4.6–6.1)
Mean Plasma Glucose: 128 mg/dL

## 2011-03-07 LAB — POCT I-STAT, CHEM 8
BUN: 6 mg/dL (ref 6–23)
Calcium, Ion: 1.15 mmol/L (ref 1.12–1.32)
Creatinine, Ser: 0.8 mg/dL (ref 0.4–1.2)
TCO2: 24 mmol/L (ref 0–100)

## 2011-03-07 LAB — FERRITIN: Ferritin: 7 ng/mL — ABNORMAL LOW (ref 10–291)

## 2011-03-07 LAB — IRON AND TIBC: UIBC: 432 ug/dL

## 2011-03-08 ENCOUNTER — Emergency Department (HOSPITAL_COMMUNITY): Payer: Medicare Other

## 2011-03-08 ENCOUNTER — Inpatient Hospital Stay (HOSPITAL_COMMUNITY)
Admission: EM | Admit: 2011-03-08 | Discharge: 2011-03-12 | DRG: 189 | Disposition: A | Discharge status: Home / Self Care | Payer: Medicare Other | Attending: Internal Medicine | Admitting: Internal Medicine

## 2011-03-08 DIAGNOSIS — R0902 Hypoxemia: Secondary | ICD-10-CM | POA: Diagnosis present

## 2011-03-08 DIAGNOSIS — J441 Chronic obstructive pulmonary disease with (acute) exacerbation: Secondary | ICD-10-CM | POA: Diagnosis present

## 2011-03-08 DIAGNOSIS — I10 Essential (primary) hypertension: Secondary | ICD-10-CM | POA: Diagnosis present

## 2011-03-08 DIAGNOSIS — K219 Gastro-esophageal reflux disease without esophagitis: Secondary | ICD-10-CM | POA: Diagnosis present

## 2011-03-08 DIAGNOSIS — J9600 Acute respiratory failure, unspecified whether with hypoxia or hypercapnia: Principal | ICD-10-CM | POA: Diagnosis present

## 2011-03-08 DIAGNOSIS — D509 Iron deficiency anemia, unspecified: Secondary | ICD-10-CM | POA: Diagnosis present

## 2011-03-08 DIAGNOSIS — E876 Hypokalemia: Secondary | ICD-10-CM | POA: Diagnosis present

## 2011-03-08 DIAGNOSIS — IMO0001 Reserved for inherently not codable concepts without codable children: Secondary | ICD-10-CM | POA: Diagnosis present

## 2011-03-08 LAB — BLOOD GAS, ARTERIAL
Acid-base deficit: 2.4 mmol/L — ABNORMAL HIGH (ref 0.0–2.0)
Acid-base deficit: 1.7 mmol/L (ref 0.0–2.0)
Bicarbonate: 17.1 mEq/L — ABNORMAL LOW (ref 20.0–24.0)
Bicarbonate: 22 mEq/L (ref 20.0–24.0)
Delivery systems: POSITIVE
FIO2: 0.4 %
O2 Saturation: 96.6 %
O2 Saturation: 97.9 %
Patient temperature: 98.6
Patient temperature: 98.6
Patient temperature: 98.6
TCO2: 15.8 mmol/L (ref 0–100)
TCO2: 22 mmol/L (ref 0–100)
pCO2 arterial: 32.7 mmHg — ABNORMAL LOW (ref 35.0–45.0)
pH, Arterial: 7.34 — ABNORMAL LOW (ref 7.350–7.400)
pH, Arterial: 7.356 (ref 7.350–7.400)
pO2, Arterial: 73.8 mmHg — ABNORMAL LOW (ref 80.0–100.0)
pO2, Arterial: 87.2 mmHg (ref 80.0–100.0)

## 2011-03-08 LAB — HEMOGLOBIN A1C: Mean Plasma Glucose: 111 mg/dL

## 2011-03-08 LAB — BASIC METABOLIC PANEL
CO2: 18 mEq/L — ABNORMAL LOW (ref 19–32)
CO2: 23 mEq/L (ref 19–32)
CO2: 25 mEq/L (ref 19–32)
CO2: 22 mEq/L (ref 19–32)
Calcium: 9 mg/dL (ref 8.4–10.5)
Calcium: 9 mg/dL (ref 8.4–10.5)
Chloride: 112 mEq/L (ref 96–112)
Chloride: 112 mEq/L (ref 96–112)
Chloride: 106 mEq/L (ref 96–112)
Creatinine, Ser: 0.97 mg/dL (ref 0.4–1.2)
Creatinine, Ser: 0.9 mg/dL (ref 0.4–1.2)
GFR calc Af Amer: >60 mL/min (ref >60)
GFR calc Af Amer: >60 mL/min (ref >60)
GFR calc Af Amer: >60 mL/min (ref >60)
GFR calc non Af Amer: >60 mL/min (ref >60)
Glucose, Bld: 244 mg/dL — ABNORMAL HIGH (ref 70–99)
Potassium: 4.5 mEq/L (ref 3.5–5.1)
Potassium: 3.1 mEq/L — ABNORMAL LOW (ref 3.5–5.1)
Sodium: 139 mEq/L (ref 135–145)
Sodium: 140 mEq/L (ref 135–145)
Sodium: 140 mEq/L (ref 135–145)

## 2011-03-08 LAB — DIFFERENTIAL
Basophils Absolute: 0 10*3/uL (ref 0.0–0.1)
Eosinophils Absolute: 0.7 10*3/uL (ref 0.0–0.7)
Lymphocytes Relative: 28 % (ref 12–46)
Lymphs Abs: 2.2 10*3/uL (ref 0.7–4.0)
Lymphs Abs: 1.8 10*3/uL (ref 0.7–4.0)
Monocytes Relative: 10 % (ref 3–12)
Neutrophils Relative %: 58 % (ref 43–77)

## 2011-03-08 LAB — CBC
HCT: 36.3 % (ref 36.0–46.0)
Hemoglobin: 8.7 g/dL — ABNORMAL LOW (ref 12.0–15.0)
MCHC: 32.1 g/dL (ref 30.0–36.0)
MCHC: 28.1 g/dL — ABNORMAL LOW (ref 30.0–36.0)
MCV: 80.3 fL (ref 78.0–100.0)
Platelets: 259 10*3/uL (ref 150–400)
Platelets: 379 10*3/uL (ref 150–400)
RBC: 4.53 MIL/uL (ref 3.87–5.11)
RDW: 18.3 % — ABNORMAL HIGH (ref 11.5–15.5)
WBC: 17.4 10*3/uL — ABNORMAL HIGH (ref 4.0–10.5)
WBC: 8.1 10*3/uL (ref 4.0–10.5)

## 2011-03-08 LAB — GLUCOSE, CAPILLARY
Glucose-Capillary: 226 mg/dL — ABNORMAL HIGH (ref 70–99)
Glucose-Capillary: 237 mg/dL — ABNORMAL HIGH (ref 70–99)
Glucose-Capillary: 238 mg/dL — ABNORMAL HIGH (ref 70–99)
Glucose-Capillary: 257 mg/dL — ABNORMAL HIGH (ref 70–99)

## 2011-03-08 LAB — HEPARIN ANTIBODY SCREEN: Heparin Antibody Screen: NEGATIVE

## 2011-03-08 LAB — CARDIAC PANEL(CRET KIN+CKTOT+MB+TROPI)
CK, MB: 1.3 ng/mL (ref 0.3–4.0)
CK, MB: 5 ng/mL — ABNORMAL HIGH (ref 0.3–4.0)
CK, MB: 7.8 ng/mL — ABNORMAL HIGH (ref 0.3–4.0)
Total CK: 101 U/L (ref 7–177)
Total CK: 297 U/L — ABNORMAL HIGH (ref 7–177)
Troponin I: 0.02 ng/mL (ref 0.00–0.06)
Troponin I: <0.01 ng/mL (ref 0.00–0.06)

## 2011-03-08 LAB — POCT B-TYPE NATRIURETIC PEPTIDE (BNP): B Natriuretic Peptide, POC: <5 pg/mL (ref 0–100)

## 2011-03-09 ENCOUNTER — Inpatient Hospital Stay (HOSPITAL_COMMUNITY): Payer: Medicare Other

## 2011-03-09 LAB — COMPREHENSIVE METABOLIC PANEL
AST: 29 U/L (ref 0–37)
Albumin: 3.5 g/dL (ref 3.5–5.2)
BUN: 8 mg/dL (ref 6–23)
CO2: 24 mEq/L (ref 19–32)
Chloride: 107 mEq/L (ref 96–112)
Creatinine, Ser: 0.93 mg/dL (ref 0.4–1.2)
Creatinine, Ser: 0.87 mg/dL (ref 0.4–1.2)
GFR calc Af Amer: >60 mL/min (ref >60)
GFR calc non Af Amer: >60 mL/min (ref >60)
Glucose, Bld: 240 mg/dL — ABNORMAL HIGH (ref 70–99)
Potassium: 3.6 mEq/L (ref 3.5–5.1)
Total Bilirubin: 0.4 mg/dL (ref 0.3–1.2)
Total Protein: 6.2 g/dL (ref 6.0–8.3)

## 2011-03-09 LAB — CBC
HCT: 29.8 % — ABNORMAL LOW (ref 36.0–46.0)
HCT: 36.4 % (ref 36.0–46.0)
HCT: 37 % (ref 36.0–46.0)
Hemoglobin: 8.4 g/dL — ABNORMAL LOW (ref 12.0–15.0)
Hemoglobin: 12.2 g/dL (ref 12.0–15.0)
MCH: 19.3 pg — ABNORMAL LOW (ref 26.0–34.0)
MCHC: 33.5 g/dL (ref 30.0–36.0)
MCV: 68.3 fL — ABNORMAL LOW (ref 78.0–100.0)
MCV: 73.6 fL — ABNORMAL LOW (ref 78.0–100.0)
MCV: 74.6 fL — ABNORMAL LOW (ref 78.0–100.0)
Platelets: 269 K/uL (ref 150–400)
Platelets: 295 10*3/uL (ref 150–400)
RBC: 4.36 MIL/uL (ref 3.87–5.11)
RBC: 4.95 MIL/uL (ref 3.87–5.11)
RDW: 31.1 % — ABNORMAL HIGH (ref 11.5–15.5)
RDW: 31.2 % — ABNORMAL HIGH (ref 11.5–15.5)
WBC: 9.9 K/uL (ref 4.0–10.5)

## 2011-03-09 LAB — BASIC METABOLIC PANEL WITH GFR
BUN: 11 mg/dL (ref 6–23)
CO2: 28 meq/L (ref 19–32)
Chloride: 107 meq/L (ref 96–112)
Creatinine, Ser: 0.67 mg/dL (ref 0.4–1.2)
Potassium: 3.6 meq/L (ref 3.5–5.1)

## 2011-03-09 LAB — CARDIAC PANEL(CRET KIN+CKTOT+MB+TROPI)
Relative Index: 1.7 (ref 0.0–2.5)
Relative Index: 2.1 (ref 0.0–2.5)
Troponin I: 0.01 ng/mL (ref 0.00–0.06)

## 2011-03-09 LAB — BLOOD GAS, ARTERIAL
Acid-base deficit: 2.1 mmol/L — ABNORMAL HIGH (ref 0.0–2.0)
Delivery systems: POSITIVE
Drawn by: 308601
Inspiratory PAP: 15
O2 Saturation: 99.4 %

## 2011-03-09 LAB — DIFFERENTIAL
Basophils Absolute: 0.4 K/uL — ABNORMAL HIGH (ref 0.0–0.1)
Basophils Relative: 0 % (ref 0–1)
Basophils Relative: 4 % — ABNORMAL HIGH (ref 0–1)
Eosinophils Absolute: 1.1 10*3/uL — ABNORMAL HIGH (ref 0.0–0.7)
Eosinophils Relative: 0 % (ref 0–5)
Eosinophils Relative: 11 % — ABNORMAL HIGH (ref 0–5)
Lymphocytes Relative: 29 % (ref 12–46)
Lymphocytes Relative: 5 % — ABNORMAL LOW (ref 12–46)
Lymphs Abs: 2.9 K/uL (ref 0.7–4.0)
Monocytes Absolute: 0.5 10*3/uL (ref 0.1–1.0)
Monocytes Relative: 0 % — ABNORMAL LOW (ref 3–12)
Monocytes Relative: 5 % (ref 3–12)
Neutro Abs: 12.4 10*3/uL — ABNORMAL HIGH (ref 1.7–7.7)
Neutro Abs: 5 K/uL (ref 1.7–7.7)
Neutrophils Relative %: 51 % (ref 43–77)

## 2011-03-09 LAB — FERRITIN: Ferritin: 14 ng/mL (ref 10–291)

## 2011-03-09 LAB — BRAIN NATRIURETIC PEPTIDE
Pro B Natriuretic peptide (BNP): <30 pg/mL (ref 0.0–100.0)
Pro B Natriuretic peptide (BNP): 47.4 pg/mL (ref 0.0–100.0)

## 2011-03-09 LAB — HEMOGLOBIN A1C: Hgb A1c MFr Bld: 7.6 % — ABNORMAL HIGH (ref <5.7)

## 2011-03-09 LAB — RAPID URINE DRUG SCREEN, HOSP PERFORMED
Amphetamines: NOT DETECTED
Benzodiazepines: NOT DETECTED
Cocaine: NOT DETECTED
Tetrahydrocannabinol: NOT DETECTED

## 2011-03-09 LAB — MAGNESIUM
Magnesium: 2.2 mg/dL (ref 1.5–2.5)
Magnesium: 1.8 mg/dL (ref 1.5–2.5)

## 2011-03-09 LAB — BASIC METABOLIC PANEL
Calcium: 8.3 mg/dL — ABNORMAL LOW (ref 8.4–10.5)
GFR calc Af Amer: >60 mL/min (ref >60)
GFR calc non Af Amer: >60 mL/min (ref >60)
Glucose, Bld: 105 mg/dL — ABNORMAL HIGH (ref 70–99)
Sodium: 141 mEq/L (ref 135–145)

## 2011-03-09 LAB — IRON AND TIBC
Iron: <10 ug/dL — ABNORMAL LOW (ref 42–135)
UIBC: 441 ug/dL

## 2011-03-09 LAB — GLUCOSE, CAPILLARY
Glucose-Capillary: 294 mg/dL — ABNORMAL HIGH (ref 70–99)
Glucose-Capillary: 248 mg/dL — ABNORMAL HIGH (ref 70–99)
Glucose-Capillary: 250 mg/dL — ABNORMAL HIGH (ref 70–99)
Glucose-Capillary: 294 mg/dL — ABNORMAL HIGH (ref 70–99)
Glucose-Capillary: 205 mg/dL — ABNORMAL HIGH (ref 70–99)
Glucose-Capillary: 212 mg/dL — ABNORMAL HIGH (ref 70–99)

## 2011-03-09 LAB — RETICULOCYTES
Retic Count, Absolute: 6.3 10*3/uL — ABNORMAL LOW (ref 19.0–186.0)
Retic Ct Pct: 0.8 % (ref 0.4–3.1)

## 2011-03-09 LAB — TSH: TSH: 0.092 u[IU]/mL — ABNORMAL LOW (ref 0.350–4.500)

## 2011-03-09 LAB — MRSA PCR SCREENING: MRSA by PCR: NEGATIVE

## 2011-03-10 ENCOUNTER — Inpatient Hospital Stay (HOSPITAL_COMMUNITY): Payer: Medicare Other

## 2011-03-10 LAB — DIFFERENTIAL
Eosinophils Relative: 0 % (ref 0–5)
Lymphs Abs: 0.6 10*3/uL — ABNORMAL LOW (ref 0.7–4.0)
Monocytes Absolute: 0.3 10*3/uL (ref 0.1–1.0)
Neutro Abs: 13.9 10*3/uL — ABNORMAL HIGH (ref 1.7–7.7)

## 2011-03-10 LAB — COMPREHENSIVE METABOLIC PANEL
Albumin: 3.2 g/dL — ABNORMAL LOW (ref 3.5–5.2)
BUN: 9 mg/dL (ref 6–23)
Calcium: 9.1 mg/dL (ref 8.4–10.5)
Creatinine, Ser: 0.7 mg/dL (ref 0.4–1.2)
Glucose, Bld: 254 mg/dL — ABNORMAL HIGH (ref 70–99)
Potassium: 4.3 mEq/L (ref 3.5–5.1)
Total Protein: 5.9 g/dL — ABNORMAL LOW (ref 6.0–8.3)

## 2011-03-10 LAB — EXPECTORATED SPUTUM ASSESSMENT W GRAM STAIN, RFLX TO RESP C

## 2011-03-10 LAB — GLUCOSE, CAPILLARY
Glucose-Capillary: 285 mg/dL — ABNORMAL HIGH (ref 70–99)
Glucose-Capillary: 111 mg/dL — ABNORMAL HIGH (ref 70–99)
Glucose-Capillary: 131 mg/dL — ABNORMAL HIGH (ref 70–99)
Glucose-Capillary: 154 mg/dL — ABNORMAL HIGH (ref 70–99)
Glucose-Capillary: 154 mg/dL — ABNORMAL HIGH (ref 70–99)
Glucose-Capillary: 210 mg/dL — ABNORMAL HIGH (ref 70–99)
Glucose-Capillary: 273 mg/dL — ABNORMAL HIGH (ref 70–99)

## 2011-03-10 LAB — MAGNESIUM: Magnesium: 2 mg/dL (ref 1.5–2.5)

## 2011-03-10 LAB — CBC
MCH: 18.9 pg — ABNORMAL LOW (ref 26.0–34.0)
MCHC: 28.2 g/dL — ABNORMAL LOW (ref 30.0–36.0)
MCV: 66.8 fL — ABNORMAL LOW (ref 78.0–100.0)
Platelets: 302 10*3/uL (ref 150–400)
RDW: 18.2 % — ABNORMAL HIGH (ref 11.5–15.5)
WBC: 14.8 10*3/uL — ABNORMAL HIGH (ref 4.0–10.5)

## 2011-03-10 LAB — TSH: TSH: 0.918 u[IU]/mL (ref 0.350–4.500)

## 2011-03-10 LAB — HEMOGLOBIN A1C
Hgb A1c MFr Bld: 5.1 % (ref 4.6–6.1)
Mean Plasma Glucose: 100 mg/dL

## 2011-03-11 LAB — GLUCOSE, CAPILLARY
Glucose-Capillary: 280 mg/dL — ABNORMAL HIGH (ref 70–99)
Glucose-Capillary: 197 mg/dL — ABNORMAL HIGH (ref 70–99)
Glucose-Capillary: 212 mg/dL — ABNORMAL HIGH (ref 70–99)
Glucose-Capillary: 229 mg/dL — ABNORMAL HIGH (ref 70–99)
Glucose-Capillary: 239 mg/dL — ABNORMAL HIGH (ref 70–99)
Glucose-Capillary: 254 mg/dL — ABNORMAL HIGH (ref 70–99)

## 2011-03-11 LAB — CBC
HCT: 26.8 % — ABNORMAL LOW (ref 36.0–46.0)
HCT: 32.4 % — ABNORMAL LOW (ref 36.0–46.0)
Hemoglobin: 7.7 g/dL — CL (ref 12.0–15.0)
Hemoglobin: 8.3 g/dL — ABNORMAL LOW (ref 12.0–15.0)
MCHC: 29 g/dL — ABNORMAL LOW (ref 30.0–36.0)
MCHC: 28.9 g/dL — ABNORMAL LOW (ref 30.0–36.0)
MCHC: 31.3 g/dL (ref 30.0–36.0)
MCV: 65.9 fL — ABNORMAL LOW (ref 78.0–100.0)
MCV: 62.9 fL — ABNORMAL LOW (ref 78.0–100.0)
Platelets: 253 10*3/uL (ref 150–400)
Platelets: 336 10*3/uL (ref 150–400)
Platelets: 421 10*3/uL — ABNORMAL HIGH (ref 150–400)
RBC: 4.84 MIL/uL (ref 3.87–5.11)
RBC: 5.1 MIL/uL (ref 3.87–5.11)
RDW: 18.1 % — ABNORMAL HIGH (ref 11.5–15.5)
RDW: 24.4 % — ABNORMAL HIGH (ref 11.5–15.5)
RDW: 29.1 % — ABNORMAL HIGH (ref 11.5–15.5)
WBC: 12.9 10*3/uL — ABNORMAL HIGH (ref 4.0–10.5)
WBC: 10.6 10*3/uL — ABNORMAL HIGH (ref 4.0–10.5)

## 2011-03-11 LAB — IRON AND TIBC
Iron: 17 ug/dL — ABNORMAL LOW (ref 42–135)
Saturation Ratios: 4 % — ABNORMAL LOW (ref 20–55)
TIBC: 465 ug/dL (ref 250–470)
UIBC: 448 ug/dL

## 2011-03-11 LAB — LIPID PANEL
Cholesterol: 131 mg/dL (ref 0–200)
HDL: 47 mg/dL (ref >39)
Total CHOL/HDL Ratio: 2.8 RATIO
Triglycerides: 38 mg/dL (ref <150)

## 2011-03-11 LAB — BRAIN NATRIURETIC PEPTIDE: Pro B Natriuretic peptide (BNP): 162 pg/mL — ABNORMAL HIGH (ref 0.0–100.0)

## 2011-03-11 LAB — MAGNESIUM: Magnesium: 2 mg/dL (ref 1.5–2.5)

## 2011-03-11 LAB — DIFFERENTIAL
Band Neutrophils: 0 % (ref 0–10)
Basophils Relative: 1 % (ref 0–1)
Eosinophils Absolute: 0 10*3/uL (ref 0.0–0.7)
Eosinophils Relative: 0 % (ref 0–5)
Eosinophils Relative: 0 % (ref 0–5)
Lymphocytes Relative: 11 % — ABNORMAL LOW (ref 12–46)
Metamyelocytes Relative: 0 %
Monocytes Absolute: 0 10*3/uL — ABNORMAL LOW (ref 0.1–1.0)
Monocytes Relative: 0 % — ABNORMAL LOW (ref 3–12)
Neutrophils Relative %: 83 % — ABNORMAL HIGH (ref 43–77)
nRBC: 0 /100 WBC

## 2011-03-11 LAB — COMPREHENSIVE METABOLIC PANEL
Albumin: 3.1 g/dL — ABNORMAL LOW (ref 3.5–5.2)
Albumin: 3.4 g/dL — ABNORMAL LOW (ref 3.5–5.2)
Alkaline Phosphatase: 78 U/L (ref 39–117)
BUN: 12 mg/dL (ref 6–23)
BUN: 8 mg/dL (ref 6–23)
BUN: 9 mg/dL (ref 6–23)
CO2: 20 mEq/L (ref 19–32)
Calcium: 8.9 mg/dL (ref 8.4–10.5)
Calcium: 9 mg/dL (ref 8.4–10.5)
Calcium: 9.4 mg/dL (ref 8.4–10.5)
Creatinine, Ser: 0.72 mg/dL (ref 0.4–1.2)
Creatinine, Ser: 1 mg/dL (ref 0.4–1.2)
GFR calc non Af Amer: >60 mL/min (ref >60)
Glucose, Bld: 210 mg/dL — ABNORMAL HIGH (ref 70–99)
Glucose, Bld: 236 mg/dL — ABNORMAL HIGH (ref 70–99)
Potassium: 4.5 mEq/L (ref 3.5–5.1)
Potassium: 3.8 mEq/L (ref 3.5–5.1)
Sodium: 137 mEq/L (ref 135–145)
Sodium: 138 mEq/L (ref 135–145)
Total Protein: 5.9 g/dL — ABNORMAL LOW (ref 6.0–8.3)
Total Protein: 6.4 g/dL (ref 6.0–8.3)
Total Protein: 6.7 g/dL (ref 6.0–8.3)

## 2011-03-11 LAB — CROSSMATCH

## 2011-03-11 LAB — CARDIAC PANEL(CRET KIN+CKTOT+MB+TROPI)
CK, MB: 2.8 ng/mL (ref 0.3–4.0)
CK, MB: 2.8 ng/mL (ref 0.3–4.0)
Relative Index: 1.2 (ref 0.0–2.5)
Relative Index: 1.2 (ref 0.0–2.5)
Troponin I: <0.01 ng/mL (ref 0.00–0.06)

## 2011-03-11 LAB — POCT I-STAT, CHEM 8
BUN: 9 mg/dL (ref 6–23)
Creatinine, Ser: 0.9 mg/dL (ref 0.4–1.2)
Hemoglobin: 10.9 g/dL — ABNORMAL LOW (ref 12.0–15.0)
Potassium: 3 mEq/L — ABNORMAL LOW (ref 3.5–5.1)
Sodium: 139 mEq/L (ref 135–145)

## 2011-03-11 LAB — POCT I-STAT 3, ART BLOOD GAS (G3+)
O2 Saturation: 95 %
TCO2: 19 mmol/L (ref 0–100)
pCO2 arterial: 27.4 mmHg — ABNORMAL LOW (ref 35.0–45.0)
pH, Arterial: 7.439 — ABNORMAL HIGH (ref 7.350–7.400)
pO2, Arterial: 70 mmHg — ABNORMAL LOW (ref 80.0–100.0)

## 2011-03-11 LAB — FERRITIN: Ferritin: 4 ng/mL — ABNORMAL LOW (ref 10–291)

## 2011-03-11 LAB — BASIC METABOLIC PANEL
BUN: 12 mg/dL (ref 6–23)
CO2: 26 mEq/L (ref 19–32)
Calcium: 8.9 mg/dL (ref 8.4–10.5)
Chloride: 105 mEq/L (ref 96–112)
Creatinine, Ser: 0.73 mg/dL (ref 0.4–1.2)
GFR calc Af Amer: >60 mL/min (ref >60)
GFR calc non Af Amer: >60 mL/min (ref >60)
GFR calc non Af Amer: >60 mL/min (ref >60)
Glucose, Bld: 167 mg/dL — ABNORMAL HIGH (ref 70–99)
Potassium: 3.5 mEq/L (ref 3.5–5.1)

## 2011-03-11 LAB — RETICULOCYTES
RBC.: 4.85 MIL/uL (ref 3.87–5.11)
Retic Ct Pct: 1.9 % (ref 0.4–3.1)

## 2011-03-11 LAB — CK TOTAL AND CKMB (NOT AT ARMC)
CK, MB: 2.6 ng/mL (ref 0.3–4.0)
Relative Index: 1 (ref 0.0–2.5)
Total CK: 248 U/L — ABNORMAL HIGH (ref 7–177)

## 2011-03-11 LAB — TSH: TSH: 0.426 u[IU]/mL (ref 0.350–4.500)

## 2011-03-11 LAB — APTT: aPTT: 26 seconds (ref 24–37)

## 2011-03-11 LAB — TROPONIN I: Troponin I: <0.01 ng/mL (ref 0.00–0.06)

## 2011-03-12 LAB — GLUCOSE, CAPILLARY

## 2011-03-15 LAB — POCT I-STAT, CHEM 8
Glucose, Bld: 221 mg/dL — ABNORMAL HIGH (ref 70–99)
HCT: 28 % — ABNORMAL LOW (ref 36.0–46.0)
Hemoglobin: 9.5 g/dL — ABNORMAL LOW (ref 12.0–15.0)
Potassium: 3.6 mEq/L (ref 3.5–5.1)
Sodium: 140 mEq/L (ref 135–145)

## 2011-03-15 LAB — POCT PREGNANCY, URINE: Preg Test, Ur: NEGATIVE

## 2011-03-15 LAB — CULTURE, BLOOD (ROUTINE X 2): Culture  Setup Time: 201204101005

## 2011-03-15 LAB — DIFFERENTIAL
Basophils Absolute: 0.1 10*3/uL (ref 0.0–0.1)
Eosinophils Absolute: 0 10*3/uL (ref 0.0–0.7)
Lymphocytes Relative: 15 % (ref 12–46)
Neutrophils Relative %: 83 % — ABNORMAL HIGH (ref 43–77)

## 2011-03-15 LAB — CBC
Platelets: 281 10*3/uL (ref 150–400)
RDW: 20.3 % — ABNORMAL HIGH (ref 11.5–15.5)

## 2011-03-22 NOTE — Discharge Summary (Signed)
NAME:  [name] [name], [name] [name] NO.:  [id number]  MEDICAL RECORD NO.:  [id number]           PATIENT TYPE:  I  LOCATION:  1439                         FACILITY:  [facility]  PHYSICIAN:  Hollice Espy, M.D.DATE OF BIRTH:  06-25-1976  DATE OF ADMISSION:  03/08/2011 DATE OF DISCHARGE:  03/12/2011                              DISCHARGE SUMMARY   PRIMARY CARE DOCTOR:  The patient is from out of town, staying with family.  She has a primary care doctor in [place].  DISCHARGE DIAGNOSES: 1. Acute asthma exacerbation present on admission. 2. Acute respiratory failure brought on by acute asthma exacerbation,     now resolved, present on admission. 3. Hypokalemia, treated, present on admission. 4. Diabetes mellitus type 2, uncontrolled, with no other complications     such as neuropathy or nephropathy, present on admission. 5. Morbid obesity, present on admission. 6. Hypertension with acute elevated blood pressure readings secondary     to hypoxia while in the hospital, present on admission. 7. History of gastroesophageal reflux disease, present on admission. 8. Microcytic anemia, present on admission. 9. Iron-deficiency anemia, present on admission  DISCHARGE MEDICATIONS:  Discharge medications for this patient are as follows: 1. Xanax 0.25 p.o. b.i.d. p.r.n. 2. Avalide 300/25 p.o. daily. 3. Benadryl 50 p.o. q.h.s. p.r.n. 4. DuoNebs inhaled every 6 hours as needed. 5. Metformin 500 p.o. b.i.d.  Please note the patient was previously     advised to take this only when on prednisone.  Given her A1c is     elevated at 7.1, I am going to advice her to take this continuous. 6. Multivitamin p.o. daily. 7. Saline nasal spray 1 spray nasally two times a day as needed. 8. Symbicort 160/4.5 two puffs inhaled b.i.d. 9. Vitamin C 1 tablet p.o. daily. 10.Zantac 300 p.o. b.i.d. 11.Iron 325 p.o. daily.  HOSPITAL COURSE:  The patient is a 35 year old African-American female with  past medical history of asthma, morbid obesity, diabetes and hypertension who presented with acute shortness of breath and initially she was requiring BiPAP, she was brought into the hospital, started on IV steroids.  In regards to her asthma over the next several days, she greatly improved and responded well.  She was on high dose steroids as of March 11, 2011.  At this point, her lungs were breathing much better. I discussed this with Pulmonary who advised that given asthma, a protracted steroid taper was not needed.  The patient could receive pulse dose steroids and be discontinued.  Given the safe side, I tapered the steroids down to 40 q.12 by the March 12, 2011, and given her lungs were clear, she has had no further problems, I am discontinuing her steroids altogether and discharging her home off prednisone.  In regards to her iron-deficiency anemia, initially she came in, she was noted to have a lower hemoglobin with an MCV of 67.  Anemia panel confirmed iron deficiency, was started iron on 325 daily.  Her hemoglobin as of day of discharge is 8.3 and stable for the left several days.  In regards to her diabetes mellitus, again noting that her A1c is 7.2, will  recommend she take metformin continuously 500 b.i.d. twice a day continuously versus waiting just while on prednisone. In regards to her blood pressure, it was elevated which was in part felt to be secondary to hypoxia and steroids; as both of these have improved, her blood pressures have improved as well.  DISPOSITION:  Her disposition is improved.  ACTIVITY:  Slowly increase.  DISCHARGE DIET:  Carb-modified, low-sodium diet.  She is being discharged to home.  She will follow up with primary doctor in one month's time.     Hollice Espy, M.D.     SKK/MEDQ  D:  03/12/2011  T:  03/12/2011  Job:  [id number]  Electronically Signed by Virginia Rochester M.D. on 03/22/2011 02:02:13 PM

## 2011-03-29 ENCOUNTER — Emergency Department (INDEPENDENT_AMBULATORY_CARE_PROVIDER_SITE_OTHER): Payer: Medicare Other

## 2011-03-29 ENCOUNTER — Observation Stay (HOSPITAL_COMMUNITY)
Admission: RE | Admit: 2011-03-29 | Discharge: 2011-03-31 | Disposition: A | Discharge status: Home / Self Care | Payer: Medicare Other | Source: Other Acute Inpatient Hospital | Attending: Family Medicine | Admitting: Family Medicine

## 2011-03-29 ENCOUNTER — Emergency Department (HOSPITAL_BASED_OUTPATIENT_CLINIC_OR_DEPARTMENT_OTHER)
Admission: EM | Admit: 2011-03-29 | Discharge: 2011-03-29 | Disposition: A | Discharge status: Other Acute Inpatient Hospital | Payer: Medicare Other | Source: Home / Self Care | Attending: Emergency Medicine | Admitting: Emergency Medicine

## 2011-03-29 DIAGNOSIS — I10 Essential (primary) hypertension: Secondary | ICD-10-CM | POA: Insufficient documentation

## 2011-03-29 DIAGNOSIS — R0602 Shortness of breath: Secondary | ICD-10-CM | POA: Insufficient documentation

## 2011-03-29 DIAGNOSIS — J45909 Unspecified asthma, uncomplicated: Secondary | ICD-10-CM | POA: Insufficient documentation

## 2011-03-29 DIAGNOSIS — Z01810 Encounter for preprocedural cardiovascular examination: Secondary | ICD-10-CM | POA: Insufficient documentation

## 2011-03-29 DIAGNOSIS — E876 Hypokalemia: Secondary | ICD-10-CM | POA: Insufficient documentation

## 2011-03-29 DIAGNOSIS — Z01811 Encounter for preprocedural respiratory examination: Secondary | ICD-10-CM | POA: Insufficient documentation

## 2011-03-29 DIAGNOSIS — D509 Iron deficiency anemia, unspecified: Secondary | ICD-10-CM | POA: Insufficient documentation

## 2011-03-29 DIAGNOSIS — IMO0001 Reserved for inherently not codable concepts without codable children: Secondary | ICD-10-CM | POA: Insufficient documentation

## 2011-03-29 DIAGNOSIS — Z01812 Encounter for preprocedural laboratory examination: Secondary | ICD-10-CM | POA: Insufficient documentation

## 2011-03-29 DIAGNOSIS — J209 Acute bronchitis, unspecified: Secondary | ICD-10-CM | POA: Insufficient documentation

## 2011-03-29 DIAGNOSIS — Z79899 Other long term (current) drug therapy: Secondary | ICD-10-CM | POA: Insufficient documentation

## 2011-03-29 DIAGNOSIS — K219 Gastro-esophageal reflux disease without esophagitis: Secondary | ICD-10-CM | POA: Insufficient documentation

## 2011-03-29 DIAGNOSIS — D649 Anemia, unspecified: Secondary | ICD-10-CM | POA: Insufficient documentation

## 2011-03-29 DIAGNOSIS — J45901 Unspecified asthma with (acute) exacerbation: Principal | ICD-10-CM | POA: Insufficient documentation

## 2011-03-29 LAB — BASIC METABOLIC PANEL
BUN: 5 mg/dL — ABNORMAL LOW (ref 6–23)
Calcium: 8.5 mg/dL (ref 8.4–10.5)
Creatinine, Ser: 0.7 mg/dL (ref 0.4–1.2)
GFR calc non Af Amer: >60 mL/min (ref >60)
Potassium: 2.9 mEq/L — ABNORMAL LOW (ref 3.5–5.1)

## 2011-03-29 LAB — CBC
MCH: 20.2 pg — ABNORMAL LOW (ref 26.0–34.0)
MCHC: 30.1 g/dL (ref 30.0–36.0)
Platelets: 390 10*3/uL (ref 150–400)
RBC: 4.5 MIL/uL (ref 3.87–5.11)
RDW: 22.4 % — ABNORMAL HIGH (ref 11.5–15.5)

## 2011-03-29 LAB — POCT CARDIAC MARKERS

## 2011-03-29 LAB — DIFFERENTIAL
Basophils Relative: 0 % (ref 0–1)
Eosinophils Absolute: 0.5 10*3/uL (ref 0.0–0.7)
Monocytes Absolute: 0.6 10*3/uL (ref 0.1–1.0)
Neutrophils Relative %: 46 % (ref 43–77)

## 2011-03-29 LAB — GLUCOSE, CAPILLARY: Glucose-Capillary: 252 mg/dL — ABNORMAL HIGH (ref 70–99)

## 2011-03-29 LAB — POCT B-TYPE NATRIURETIC PEPTIDE (BNP): B Natriuretic Peptide, POC: 5.1 pg/mL (ref 0–100)

## 2011-03-29 NOTE — H&P (Signed)
NAME:  JENNETT, TARBELL NO.:  [id number]  MEDICAL RECORD NO.:  [id number]           PATIENT TYPE:  O  LOCATION:  1441                         FACILITY:  [facility]  PHYSICIAN:  Lonia Blood, M.D.DATE OF BIRTH:  25-Feb-1976  DATE OF ADMISSION:  03/29/2011 DATE OF DISCHARGE:                             HISTORY & PHYSICAL   PRIMARY CARE PHYSICIAN:  No local physician.  CHIEF COMPLAINT:  Acute asthma exacerbation.  HISTORY OF PRESENT ILLNESS:  Ms. Jaimi Belle is a very pleasant 35- year-old female who actually resides permanently in [place] with her family.  She has a sister who has undergone a recent heart surgery and had temporarily relocated to [facility] to attempt to assist her sister in her care needs.  While she has been here, she suffered a number of episodes of her asthma.  She is most recently admitted on March 08, 2011 and discharged March 12, 2011 after an acute exacerbation.  She reports that she has done well since that time.  This morning however, she awoke with what she reports is her usual shortness of breath.  She states that she almost always wakes up with a sensation of heavy congestion of throat and shortness of breath with some wheezing.  She typically takes a neb however, and her symptoms rapidly resolve.  Today, however, after taking her initial morning neb, her symptoms had not improved at all. This was quite concerning to her.  As a result, the patient presented to the [facility] for evaluation.  She admits that at [facility] the patient was treated for an extended period of time with steroids and continuous nebulizer therapies.  Unfortunately, she failed to respond significantly.  Though she has improved.  She is not yet back to her baseline and it was not felt advisable to discharge her home.  The emergency room physician contacted me and we agreed that an observation admission with intentions of assuring the patient is  stable will be appropriate.  As a result, the patient has been transferred to a telemetry bed on the fourth floor, [facility].  At the time of the patient's arrival, she is resting comfortably.  Her respiratory status is stabilized for now.  She reports that her wheezing has essentially resolved, but she still does not feel that she is completely back to her baseline.  She denies chest pain, fever, chills, nausea, vomiting, or abdominal pain.  She is hungry at the moment and states that she has not had anything significant tea since this morning.  REVIEW OF SYSTEMS:  Comprehensive 11-point review of systems is accomplished and is unrevealing with exception to the positive elements as noted in the history of present illness above.  PAST MEDICAL HISTORY: 1. Asthma with frequent episodes of bronchospastic exacerbation. 2. Uncontrolled diabetes mellitus type 2. 3. Morbid obesity. 4. Hypertension. 5. Gastroesophageal reflux disease. 6. Chronic iron deficiency anemia.  OUTPATIENT MEDICATIONS: 1. Zantac. 2. Symbicort 3. Multivitamins. 4. Metformin. 5. Iron. 6. DuoNeb. 7. Benadryl. 8. Avalide.  ALLERGIES: 1. ASPIRIN. 2. IBUPROFEN.  FAMILY HISTORY:  The patient's mother has type 1 diabetes.  The patient's  father has a degenerative joint disease that she is not able otherwise define.  SOCIAL HISTORY:  As noted above, the patient actually resides permanently in [place].  Her plan is to return to [place] on Thursday of this week.  It is presently Monday.  She does not smoke or drink alcohol.  DATA REVIEW:  Hemoglobin is 9.1, which is stable from a recent hemoglobin of 8.5, MCV is low at 67, platelet count and white count are normal.  Sodium chloride, bicarbonate, BUN and creatinine are normal. Potassium is low at 2.9.  Serum glucose is elevated at 133.  Serum calcium is 8.5.  BNP is within normal range.  Chest x-ray reveals bronchitic change, but no acute  disease, otherwise.  Hemoglobin A1c during recent hospital stay was 7.6 with a TSH at that time of 0.092.  PHYSICAL EXAMINATION:  VITAL SIGNS:  Temperature 98.8, blood pressure 151/70, heart rate 121, respiratory rate 24, and O2 saturation 99% on 2 L per minute, nasal cannula. GENERAL:  Morbidly obese female in no acute respiratory distress at the present time who is somewhat tremulous. HEENT:  Normocephalic and atraumatic.  Pupils equal, round, and reactive to light and accommodation. NECK:  Not able to be accomplished due to morbid obesity. LUNGS:  Distant breath sounds throughout all fields, but no focal crackles with good air movement throughout and no active wheeze at the present time. CARDIOVASCULAR:  Distant heart sounds, but no gallop or rub is appreciable.  The patient is tachycardic at approximately 100 beats per minute at the present time, but regular. ABDOMEN:  Morbidly obese and soft.  Bowel sounds present.  No organomegaly or rebound.  No ascites appreciable, though exam is quite difficult. EXTREMITIES:  Trace bilateral lower extremity edema without cyanosis or clubbing. NEUROLOGIC:  Nonfocal neurologic exam.  IMPRESSION AND PLAN: 1. Recurrent acute asthma exacerbation - Ms. Reller appears to have     stabilized for now.  Given her multiple frequent exacerbations;     however, I do feel it is reasonable to go ahead with an observation     admission.  We will observe the patient overnight.  We will     continue frequent nebs and IV Solu-Medrol for now.  If the     patient's respiratory status remain stable, plan will be to     discharge her in the morning.  She plans to return to [place]     later this week and therefore, establishment with local physician     would not be helpful.  I have warned the patient that should she     worsen, she may require transition of her stay into a full     inpatient stay with further days of treatment being required. 2. Hypokalemia  - This is likely consequence of the patient's ongoing     Avalide, diuretic/ARB therapy.  We will continue this for now as     she does suffer with hypertension.  We will simply supplement     potassium in the phase of ongoing Avalide use. 3. Uncontrolled diabetes mellitus, type 2 - We will place the patient     on sliding scale insulin.  For now, we will continue her metformin.     We will follow her CBGs closely. 4. Chronic iron deficiency anemia - Given the patient assumed to leave     town, I will not pursue a full evaluation of this at this time.  I  suspect this has been evaluated in the past.  Nonetheless, we will     discuss the need for this to be followed in the outpatient setting     with the patient at the time of her discharge. 5. Hypertension - The patient's blood pressure is mildly elevated at     present time.  This is likely a sequelae of anxiety related to     ongoing use of albuterol and her shortness of breath.  We will     follow her vitals closely and adjust treatment as indicated.     Lonia Blood, M.D.     JTM/MEDQ  D:  03/29/2011  T:  03/29/2011  Job:  [id number]  Electronically Signed by Jetty Duhamel M.D. on 03/29/2011 06:53:09 PM

## 2011-03-30 LAB — MAGNESIUM: Magnesium: 2 mg/dL (ref 1.5–2.5)

## 2011-03-30 LAB — BASIC METABOLIC PANEL
Calcium: 9.3 mg/dL (ref 8.4–10.5)
GFR calc Af Amer: >60 mL/min (ref >60)
GFR calc non Af Amer: 58 mL/min — ABNORMAL LOW (ref >60)
Potassium: 3.5 mEq/L (ref 3.5–5.1)
Sodium: 141 mEq/L (ref 135–145)

## 2011-03-30 LAB — CBC
MCHC: 28.8 g/dL — ABNORMAL LOW (ref 30.0–36.0)
Platelets: 504 10*3/uL — ABNORMAL HIGH (ref 150–400)
RDW: 21.9 % — ABNORMAL HIGH (ref 11.5–15.5)
WBC: 9.9 10*3/uL (ref 4.0–10.5)

## 2011-03-30 LAB — GLUCOSE, CAPILLARY
Glucose-Capillary: 244 mg/dL — ABNORMAL HIGH (ref 70–99)
Glucose-Capillary: 206 mg/dL — ABNORMAL HIGH (ref 70–99)

## 2011-03-31 LAB — BASIC METABOLIC PANEL
BUN: 20 mg/dL (ref 6–23)
CO2: 21 mEq/L (ref 19–32)
Calcium: 9.9 mg/dL (ref 8.4–10.5)
Glucose, Bld: 237 mg/dL — ABNORMAL HIGH (ref 70–99)
Sodium: 138 mEq/L (ref 135–145)

## 2011-03-31 LAB — GLUCOSE, CAPILLARY

## 2011-03-31 LAB — CBC
HCT: 29.9 % — ABNORMAL LOW (ref 36.0–46.0)
Hemoglobin: 8.6 g/dL — ABNORMAL LOW (ref 12.0–15.0)
MCH: 19.8 pg — ABNORMAL LOW (ref 26.0–34.0)
MCHC: 28.8 g/dL — ABNORMAL LOW (ref 30.0–36.0)
MCV: 68.7 fL — ABNORMAL LOW (ref 78.0–100.0)

## 2011-04-04 NOTE — H&P (Signed)
NAME:  [name] [name], [name] [name] NO.:  [id number]  MEDICAL RECORD NO.:  [id number]           PATIENT TYPE:  E  LOCATION:  WLED                         FACILITY:  [facility]  PHYSICIAN:  Lonia Blood, M.D.      DATE OF BIRTH:  04-27-1976  DATE OF ADMISSION:  03/08/2011 DATE OF DISCHARGE:                             HISTORY & PHYSICAL   PRIMARY CARE PHYSICIAN:  She is unassigned to [place].  PRESENTING COMPLAINT:  Shortness of breath.  HISTORY OF PRESENT ILLNESS:  The patient is a 35 year old morbidly obese woman with history of asthma, who has been doing fine at home.  She was seen 2 days ago in the ED due to acute asthma exacerbation.  She was sent home on steroid taper as well as her inhalers.  The patient came back today by EMS secondary to worsening shortness of breath.  She was in impending respiratory failure.  Could not even talk due to tachypnea. She was having some cough apparently and associated shortness of breath,but no chest pain.  On arrival, she was initially drowsy, but currently awake, alert, oriented and able to give history.  She is on BiPAP, but we are able to at least talk to her. She denied any recent cold symptoms, no fever.  She apparently has been battling with this problem for the last few months trying to make it clear.  She was admitted earlier this month on March 9th.  At that time, it was presumed that she had acute bronchitis, but did not get the asthma.  She was subsequently discharged on March 11th, but she is back again with the same problem.  PAST MEDICAL HISTORY:  Significant for known extrinsic asthma.  She is status post intubation in 2003 at [place], [place] due to her asthma exacerbation.  History of recurrent asthma exacerbations and acute bronchitis, iron deficiency anemia, morbid obesity, history of nasal polyps, hypertension, type 2 diabetes.  ALLERGIES: 1. ASPIRIN. 2. IBUPROFEN.  CURRENT MEDICATIONS:  Include prednisone  taper, Avalide, metformin, and DuoNeb.  SOCIAL HISTORY:  The patient lives here in [place].  She denied any tobacco, alcohol, or IV drug use.  She is able to communicate without a problem.  She has no primary care physician so far.  FAMILY HISTORY:  Significant for type 1 diabetes in her mother, father has degenerative joint disease.  Her family is living in [place] and she also recently moved down here.  REVIEW OF SYSTEMS:  The patient reported a fall that happened 2 days ago when she had the asthma attack and that led to some bruising in her face.  Otherwise, all systems reviewed are currently negative except per HPI.  PHYSICAL EXAMINATION:  VITAL SIGNS:  Temperature 98.7; blood pressure 137/109 initially, currently 140/80; her initial pulse 142; respiratory rate 20; sats 100% on BiPAP. GENERAL:  The patient is morbidly obese, in obvious respiratory distress, talking in short sentences, most of them incomplete using accessory muscles of respiration. HEENT:  PERRL.  EOMI.  She has raccoon type eyes on the left consistent with some facial trauma.  Also mild bruising at the medial aspect of  her eyebrow. NECK:  Supple.  No JVD, no lymphadenopathy. RESPIRATORY:  She has decreased air entry bilaterally, using accessory muscles of respiration.  She is tachypneic with marked expiratory wheezing. CARDIOVASCULAR SYSTEM:  She is tachycardic. ABDOMEN:  Obese, soft, nontender with positive bowel sounds. EXTREMITIES:  No edema, cyanosis, or clubbing. SKIN EXAM:  Mainly the bruises around her left eye.  Otherwise, no other rashes or ulcers. MUSCULOSKELETAL:  No joint swelling or tenderness.  No disfigurement.  LABORATORY DATA:  White count is 14.2, hemoglobin 8.7 with an MCV of 67, platelet count 379, she has left shift with ANC of 11.0.  Sodium is 140, potassium 3.1, chloride 106, CO2 of 22, glucose 206, BUN 9, creatinine 0.77, and calcium 9.0.  Her ABGs showed a pH of 7.356, PCO2 of  42, and PO2 of 111 with sats 98% on BiPAP, the setting of 12 and 5 with FiO2 of 0.4.  Her chest x-ray shows no acute cardiopulmonary process.  ASSESSMENT:  This is a 35 year old female with known history of asthma presenting with impending respiratory failure secondary to acute asthma exacerbation.  The trigger at this point is unknown, but could still be some acute viral infection.  She also has hypokalemia probably from the use of albuterol, hyperglycemia from her uncontrolled diabetes and being on prednisone.  Gastroesophageal reflux disease, morbid obesity, and microcytic anemia of iron deficiency.  PLAN: 1. Acute respiratory failure.  The patient is currently on BiPAP.  We     will try the BiPAP for now, continue with her nebulizers, treat her     asthma.  If she continues not to improve well, we may end up     intubating her.  At this point, we will hold off, put her in the     ICU and get involved, and monitor her overnight. 2. Acute asthma exacerbation.  I will start her on IV steroids,     continue with nebulizers.  I will use Xopenex nebulizer in this     case due to her tachypnea.  I will also use the Atrovent with it. 3. Hypokalemia.  I will replete her potassium also and add it in her     IV fluids. 4. Microcytic anemia.  I will check anemia panel and guaiac her stool     x3.  She will probably need to be on iron therapy.  Her last     hemoglobin was 9.6 a month ago and it has drifted down now to 8.7.     We will continue to monitor her closely. 5. Diabetes.  The patient will be on steroids, so I suspect that she     would be more hyperglycemic than usual.  I will put her on some     empiric Lantus with sliding scale insulin. 6. GERD.  Continue with PPIs in the hospital.  Further treatment will     depend on the patient's response to these measures.     Lonia Blood, M.D.     Verlin Grills  D:  03/08/2011  T:  03/09/2011  Job:  [id number]  Electronically Signed by Lonia Blood M.D. on 04/04/2011 10:04:14 PM

## 2011-04-12 NOTE — Discharge Summary (Signed)
NAME:  [name] [name], [name] [name]            ACCOUNT NO.:  [id number]  MEDICAL RECORD NO.:  [id number]           PATIENT TYPE:  O  LOCATION:  1441                         FACILITY:  [facility]  PHYSICIAN:  Kela Millin, M.D.DATE OF BIRTH:  1976/08/20  DATE OF ADMISSION:  03/29/2011 DATE OF DISCHARGE:  03/31/2011                        DISCHARGE SUMMARY - REFERRING   DISCHARGE DIAGNOSES: 1. Acute bronchitis. 2. Acute asthma exacerbation, recurrent. 3. Uncontrolled diabetes mellitus. 4. Hypertension. 5. Morbid obesity. 6. Gastroesophageal reflux disease. 7. History of chronic iron-deficiency anemia. 8. Hypokalemia, resolved.  PROCEDURES AND STUDIES:  Chest x-ray on 04/30:  Bronchitic-type changes, no acute findings in the one-view.  CONSULTATIONS:  None.  BRIEF HISTORY:  The patient is a pleasant 35 year old black female with the above listed medical problems who presented with complaints of shortness of breath.  She reported that she almost always would wake up in the morning with a sensation of heavy congestion in her throat and shortness of breath with some wheezing but typically she uses her nebulizers and her symptoms would resolve.  On the day of presentation, she used her initial nebulizer and that did not improve at all and this was concerning to her so she went to the [facility] for evaluation.  At the [facility] ED, she was treated with steroids and continuous nebulizers, but she did not improve significantly and so she was transferred to [facility] for admission for further evaluation and management.  On further interviewing the patient in the hospital, she admitted to increased cough and shortness of breath.  She denied chest pain, fevers, chills, nausea or vomiting, and no abdominal pain.  It was noted that the patient had last been hospitalized from April 9 and discharged April 13 following admission for acute asthma exacerbation with acute respiratory  failure.  HOSPITAL COURSE: 1. Acute bronchitis:  Upon admission, the patient had a chest x-ray     done which revealed bronchitic changes and, as already discussed     above, she reported shortness of breath as well as a cough.  The     patient was treated with nebulized bronchodilators as well as     antibiotics.  She is clinically improved at this time and will be     discharged to follow up outpatient. 2. Acute asthma exacerbation, recurrent:  As discussed above, upon     admission, the patient was placed on nebulized bronchodilators as     well as antibiotics for #1.  She was also placed on IV steroids.     Her symptoms improved.  At this time, she is oxygenating well on     room air and feels back to her baseline at this time.  She will be     discharged home on a prednisone taper as well as oral antibiotics     as above and she is to follow up outpatient.  She is unsure exactly     how much longer she will be here in [place] or if she will     return to [place].  She has been given the number to [facility] to help her locate a primary care physician to follow up     with as long as she is here. 3. Hypokalemia:  Impression was that this was secondary to the HCTZ     (in the Avalide) that the patient was on.  Her potassium was     repleted during this hospital stay and her last potassium today     prior to discharge is 3.7.  She will be discharged on oral     potassium and she is to follow up outpatient. 4. Diabetes mellitus, uncontrolled:  Her blood glucose control     worsened while on the IV steroids.  Her steroids have been changed     to p.o. and she is to continue tapering them off as directed, and     her blood sugar control should continue to improve with the     tapering.  She was placed on sliding scale along with her metformin     while she was in the hospital and she is to continue the metformin     upon discharge. 5. Hypertension:  The patient is to  continue her outpatient     medications upon discharge. 6. GERD:  She is to continue her Zantac upon discharge.  DISCHARGE MEDICATIONS: 1. Alprazolam 0.25 mg p.o. b.i.d. p.r.n. 2. Guaifenesin 600 mg p.o. b.i.d. 3. Avelox 400 mg p.o. daily. 4. Potassium chloride 20 mEq p.o. daily. 5. Prednisone taper as directed per medication reconciliation form. 6. Avalide 300/25 mg one p.o. daily. 7. Benadryl 25 mg 2 tablets at bedtime as previously. 8. DuoNeb q.6 h as previously. 9. Iron over-the-counter 325 mg p.o. daily. 10.Metformin 500 mg p.o. b.i.d. 11.Multivitamins one p.o. daily. 12.Saline nasal spray, 1 spray nasally t.i.d. p.r.n. 13.Symbicort 160/4.5 mcg 2 puffs b.i.d. 14.Zantac 300 mg p.o. b.i.d.  FOLLOWUP CARE:  Primary care physician in 1 to 2 weeks.  DISCHARGE CONDITION:  Improved/stable.     Kela Millin, M.D.     ACV/MEDQ  D:  03/31/2011  T:  03/31/2011  Job:  [id number]  Electronically Signed by Donnalee Curry M.D. on 04/12/2011 11:22:35 AM

## 2011-04-13 NOTE — H&P (Signed)
NAME:  [name] [name], [name] [name] NO.:  [id number]   MEDICAL RECORD NO.:  [id number]          PATIENT TYPE:  OBV   LOCATION:  1861                         FACILITY:  [facility]   PHYSICIAN:  Eduard Clos, MDDATE OF BIRTH:  01-26-1976   DATE OF ADMISSION:  03/23/2009  DATE OF DISCHARGE:  03/23/2009                              HISTORY & PHYSICAL   PRIMARY CARE PHYSICIAN:  Talmadge Coventry, MD   CHIEF COMPLAINT:  Shortness of breath.   HISTORY OF PRESENT ILLNESS:  A 35 year old female with history of  bronchial asthma over the last 10 years presented to the ER complaining  of increasing shortness of breath over the last 3 days.  Along with the  shortness of breath, the patient has been having cough with productive  sputum, at times greenish in color, denies any fever or chills, chest  pain or any dizziness or loss of consciousness or diaphoresis.   Initially, when she was short of breath, she also had some nausea and  vomiting which is resolved now at this time.  In the ER, the patient had  got some albuterol treatment, despite which the patient is still short  of breath and has difficulty talking in complete sentences but is  otherwise not in any acute distress.  The patient is being admitted for  further evaluation and management of asthma exacerbation.  The patient  was just admitted last month for similar complaint.  At that time, she  did have a 2-D echocardiogram which showed EF of 70% with normal  systolic function.  Left ventricle size was normal.   PAST MEDICAL HISTORY:  1. Bronchial asthma.  2. Hypoglycemia secondary to steroids.   PAST SURGICAL HISTORY:  Cerclage surgery for uterus for miscarriage and  tubal ligation.   SOCIAL HISTORY:  The patient denies smoking cigarettes, drinking  alcohol, or using illegal drugs.   REVIEW OF SYSTEMS:  Nothing else except the ones mentioned in history of  present illness.   MEDICATIONS PER ADMISSION:  1. Symbicort  2 puffs 160/4.5 twice daily.  2. Nasonex nasal spray 2 puffs each naris daily.  3. Albuterol inhalation 2 puffs as needed.  4. Albuterol nebulizer.  5. Multivitamin and iron pills.   ALLERGIES:  ASPIRIN AND IBUPROFEN.   PHYSICAL EXAMINATION:  GENERAL:  The patient was examined at bedside,  not in acute distress, but does have mild difficulty completing  sentences because of shortness of breath.  VITAL SIGNS:  Blood pressure is 130/80, pulse 110 per minute,  temperature 98.4, respirations 18, and O2 saturation 96%.  HEENT:  Anicteric.  No pallor.  CHEST:  Bilateral air entry present.  Bilateral __________ wheezes.  No  crepitation.  HEART:  S1, S2 heard.  ABDOMEN:  Soft, nontender.  Bowel sounds heard.  CNS:  Alert, awake, oriented to time, place, and person.  Moves upper  and lower extremities 5/5.  EXTREMITIES:  Peripheral pulses felt.  No edema.   LABORATORIES:  Chest x-ray done on March 23, 2009, shows no acute  findings.  BMET and CBC have been just ordered.  We will also get IgA  level.   ASSESSMENT:  1. Acute exacerbation of bronchial asthma with bronchitis.  2. History of hypoglycemia secondary to steroids.  3. Morbid obesity.  4. History of iron deficiency anemia.   PLAN:  Will admit the patient to telemetry.  Will place the patient on  IV Solu-Medrol, bronchodilators, IV antibiotics and check BMET, CBC and  also IgA levels are high, may probably need a primary followup for  further assessment as outpatient.  Further recommendation as condition  evolves.      Eduard Clos, MD  Electronically Signed     ANK/MEDQ  D:  03/24/2009  T:  03/24/2009  Job:  [id number]   cc:   Talmadge Coventry, M.D.

## 2011-04-13 NOTE — H&P (Signed)
NAME:  [name] [name], KEETCH NO.:  [id number]   MEDICAL RECORD NO.:  [id number]          PATIENT TYPE:  INP   LOCATION:  1229                         [facility]ACILITY:  [facility]   PHYSICIAN:  Vania Rea, M.D. DATE OF BIRTH:  [birth year]   DATE OF ADMISSION:  05/20/2009  DATE OF DISCHARGE:                              HISTORY & PHYSICAL   PRIMARY CARE PHYSICIAN:  Talmadge Coventry, M.D.   CHIEF COMPLAINT:  Shortness of breath.   HISTORY OF PRESENT ILLNESS:  This is a 35 year old morbidly obese  African American lady with a history of asthma and hypertension, who has  had recurrent admissions to this hospital for respiratory distress due  to uncontrolled asthma.  Has been having a continuous asthma attack for  3 days now, unrelieved by home nebulizations.  Eventually went to the  [facility] emergency room, received serial nebulizations with  minimal improvement but developed severe tachycardia, and the  hospitalist service was called to accept patient in transfers.  Of  __________, the patient is in the intensive care unit, continues to be  in respiratory distress.  The patient says for the past few days she has  been having a cough productive of yellow-brown sputum but no fever.  She  confirms that her wheezing has not been relieved by home nebulizations  but with the continuous nebulizers that she has been getting in the  emergency room, she is now feeling better.  She denies any fever.  She  has been having episodic nausea and vomiting but no diarrhea.   The patient reports that about 10 years ago she was diagnosed with  obstructive sleep apnea and was using a BiPAP machine but having moved  out of [place], she is no longer able to rent the machine and has not  been reassessed for obstructive sleep apnea.   The patient also reports that she used to take Singulair, took it for  about 6 months, but did not see any reduction in the frequency of her  asthma  attacks and discontinued taking it.   The patient reports postnasal drip, which she says aggravates asthma  attacks, seemed to coincide with flare of her postnasal drip.  The  patient also has nasal polyps and is allergic to ASPIRIN and IBUPROFEN.   PAST MEDICAL HISTORY:  1. Hypertension.  2. Diabetes, steroid-induced.  3. Morbid obesity.  4. Asthma.   MEDICATIONS:  1. Symbicort 160/4.5 one puff twice daily.  2. Albuterol nebulizer p.r.n.   ALLERGIES:  To ASPIRIN and IBUPROFEN.   SOCIAL HISTORY:  Denies tobacco, alcohol or illicit drug use.  She is  currently on disability.   FAMILY HISTORY:  Significant for mother with diabetes type 1.   REVIEW OF SYSTEMS:  Other than noted above, a 10-point review of systems  is unremarkable.   PHYSICAL EXAMINATION:  A morbidly obese African American lady lying on a  stretcher.  She gives her height as 5 feet 7 inches and her weight is  280 pounds.  VITAL SIGNS:  Her temperature is 98.7, her pulse is 129, respirations 28  to 35.  She is saturating at 97% on 12 L by mask.  HEENT:  Her pupils are round, equal and reactive.  Mucous membranes  pink.  Mildly dehydrated.  No cervical lymphadenopathy or thyromegaly.  CHEST:  She has diffuse coarse wheezing, worse on the left.  CARDIOVASCULAR SYSTEM:  She is tachycardic, no murmur heard.  ABDOMEN:  Obese, soft.  No masses, no tenderness.  EXTREMITIES:  Without edema.  She has 2+ dorsalis pedis pulses, rapid  and bounding.  SKIN:  She has multiple erythematous papules on the lower extremities,  which she attributes to mosquito bites.  Her skin is otherwise warm, dry  and free of other ulcerations.  CENTRAL NERVOUS SYSTEM:  Cranial nerves II-XII are grossly intact, and  she has no focal neurologic deficit.   LABORATORY DATA:  Her CBC:  White count is 8.1, hemoglobin 12.4, MCV  78.7, platelets 259.  She has 8% eosinophils, but her absolute  eosinophil count is upper limits of normal at 700.  Her  serum chemistry  significant for a sodium of 138, a potassium 2.4, chloride 100, CO2 25,  glucose 148, BUN 9, creatinine 0.9, her calcium is 9.0.  B-type  natriuretic peptide is undetectable.  Chest x-ray shows bibasilar  atelectasis, no pneumothorax or pleural effusion.  Her EKG shows sinus  tachycardia.   ASSESSMENT:  1. Acute respiratory failure.  2. Status asthmaticus causing the above.  3. Questionable acute bronchitis.  4. Acute on chronic sinusitis.  5. Hyperglycemia.  6. Severe hypokalemia possibly related to hydrochlorothiazide      prescribed at her previous admission.  7. Microcytosis.   PLAN:  1. We will check this lady's serum magnesium and replace magnesium and      potassium as necessary.  2. We will continue serial nebulizations but will switch to Xopenex      rather than albuterol.  We will continue      Symbicort or Advair.  We will continue serial steroid and we will      couple with Zithromax.  We will consult respiratory therapist for      assistance with management while the patient is in the intensive      care unit.   Other plans as per orders.      Vania Rea, M.D.  Electronically Signed     LC/MEDQ  D:  05/20/2009  T:  05/20/2009  Job:  [id number]   cc:   Talmadge Coventry, M.D.  Fax: [phone]

## 2011-04-13 NOTE — H&P (Signed)
NAME:  [name] [name], OEHLER NO.:  [id number]   MEDICAL RECORD NO.:  [id number]          PATIENT TYPE:  INP   LOCATION:  0104                         FACILITY:  [facility]   PHYSICIAN:  Theodosia Paling, MD    DATE OF BIRTH:  19-Feb-1976   DATE OF ADMISSION:  04/06/2009  DATE OF DISCHARGE:                              HISTORY & PHYSICAL   PRIMARY CARE PHYSICIAN:  Dr. Talmadge Coventry.   CHIEF COMPLAINT:  Wheezing and shortness of breath.   HISTORY OF PRESENT ILLNESS:  Ms. [name] [name] is a pleasant 35 year old  morbidly obese lady with a history of asthma for more than a decade and  is presenting to ER with severe shortness of breath.  According  according to the chart review, she has several ER visits and recurrent  admissions because asthma exacerbation indicating her asthma has been  very poorly controlled.  According to her, after she was discharged last  time on January 25, 2009, within a week she started having shortness of  breath, needing nebulizer every 4 hours.  She was also having  intermittent cough and for the last 4 days, she has been needing to use  nebulizer every 2 hours.  She got very worried as she could  barely  catch her breath; so, she decided to present to the ER for further eval  and management.  In the emergency room, the patient was still wheezing  despite several neb treatments.  Her chest x-ray was negative but she  was very symptomatic.  Therefore, [facility] were contacted  for further evaluation and management.   REVIEW OF SYSTEMS:  Review of systems is positive for following:  1. Postnasal drip.  2. Symptoms suggestive of obstructive sleep apnea.  3. Symptoms suggestive of GERD.  4. Exhaustion and lethargy in general.  5. Nausea and vomiting after excessive coughing.   PAST MEDICAL HISTORY:  Positive for:  1. Asthma with a history of status asthmaticus and acute respiratory      failure in July 2007 and she was not intubated.  2.  Recurrent ED visits for asthma exacerbation indicating poor overall      control of asthma.  3. Morbid obesity.  4. GERD, questionable history of endoscopy where questionable scarring      was seen.  5. Anemia of unknown etiology.  According to previous chart      documentation, the patient has dysfunctional uterine bleeding.  6. Nasal polyps.  Surgery was not performed because of anemia.  7. Status post bilateral tubal ligation.   HOME MEDICATIONS:  The patient was recently discharged from the  following medications:  1. Symbicort 2 puffs p.o. q.12 h.  2. Nasonex 2 puffs intranasal q.12 h.  3. Albuterol 2 puffs MDI q.6 h. p.r.n.  4. Multivitamin 1 tablet daily.  5. Albuterol 2.5 mg nebs q.6 h. p.r.n.  6. Iron tablet one tablet p.o. daily.   ALLERGIES:  The patient is allergic to penicillin which causes abdominal  pain and nausea, allergic or intolerant to tetracycline, intolerant  Vicks  cough medicine, intolerant ibuprofen which causes swelling, and  allergic to  shellfish.   SOCIAL HISTORY:  The patient is single and has three kids.  Student at  [employer], [place].  Denies tobacco, alcohol or IV drug abuse.  She is currently on  disability.   FAMILY HISTORY:  Mother has type 1 diabetes.  Father had DJD.   EXAMINATION:  Temperature afebrile, heart rate 1112, sinus tachy, 97% on  2 L nasal cannula oxygen, respiratory rate 16-20, blood pressure 153/82.  GENERAL:  The patient is mildly tachypneic with audible rhonchi at  bedside are present.  HEENT:  EOM intact.  No ear or nose discharge.  LUNGS:  The patient has scattered rhonchi everywhere and decreased air  entry at bases.  CVS:  Distant S1, S2.  No murmur audible.  GI: Obese, soft, nontender.  No organomegaly.  EXTREMITIES:  No pedal edema.  PSYCH:  Oriented x3.  CNS: Follows commands.  Motor and sensory intact.  Speech intact.   LABORATORY DATA:  WBC 9.9, hemoglobin 12.2, hematocrit 36.4, platelet  count 312.  Sodium 141, potassium  3.6, chloride 107, bicarbonate 28, BUN  11, creatinine 0.67, glucose 105.  Chest x-ray negative for acute  infiltrate.   ASSESSMENT AND PLAN:  1. Acute asthma exacerbation.  The patient is very poorly controlled.      The patient has undergone pulmonary function testing at least  45-      years back.  I think the contributing factors are as follows:  The      review of systems suggestive she may have some postnasal drip and      allergic complement of the asthma could be there.  She also has      heartburn and the only she takes is occasionally ranitidine, so      she may have gastroesophageal reflux disease which is contributing      to her asthma exacerbation and also the overall she is anemic that      subjectively puts her at feeling short of breath more than others.      In addition, she has these episodes where she wakes up in the      middle of the night catching her breath as if her breathing has      stopped suggestive of obstructive sleep apnea.  So, for the      allergic component, I have added Singulair and will give her      steroids anyway.  For postnasal drip competent, I have added      Sudafed at this time and for the gastroesophageal reflux disease      complement, I have added a PPI in a high-dose.  The patient      definitely needs a pulmonary consult as an outpatient and sleep      study as soon as possible sor that she can be started on CPAP at      home.  Her body habitus and the above review of systems is very      revealing of that.  At this time, we will continue albuterol      nebulizers p.r.n. and IV steroids, Nasonex, Sudafed, and Singulair.      This.  The patient has sputum which is off-white colored.  There is      no apparent infiltrate on chest x-ray, however, she may have      complement of bronchitis.  Therefore, IV azithromycin is started.      Will continue above management and as I mentioned before, the  patient will definitely need a pulmonary  function test and sleep      study for sleep apnea as an outpatient with a pulmonologist.  I      have stressed to the patient the importance of medication      compliance at this time.  2. Hypertension.  The patient apparently has never been hypertensive.      According to the last discharge summary, at the time of discharge,      her blood pressure was 100/40.  I am not sure if it is reactive      hypertension; so, I am going to defer the hypertension management      at this time.  The patient can be evaluated if she has persistent      hypertension.  HCTZ can be added to her for treating the      hypertension.  3. Anemia.  The patient has iron-deficiency anemia.  She has      menorrhagia, changes 4-5 pads in 1 day and her bleeding lasted 3      days.  It is not irregular, however.  She has never seen a      gynecologist and neither was any ultrasound done.  Therefore,  I am      going to ahead and request a pelvic ultrasound.  She will need a      outpatient gynecology follow-up for more accurate transvaginal      ultrasound and evaluation of that.  I will place the patient on Nu-      Iron which is better tolerated along with folic acid.  Also given      her symptoms, she may have a component of peptic ulcer disease.      Therefore,  stool occult blood test is added.  To see the      effectiveness of her previous iron supplementation, I am going to      repeat serum iron and ferritin in the morning along with manual      differential and red count.  4. Prophylaxis.  DVT and GI on board.  5. Code status.  The patient is full code.   Total time spent in admission of this patient around 1 hour.      Theodosia Paling, MD  Electronically Signed     NP/MEDQ  D:  04/06/2009  T:  04/06/2009  Job:  [id number]   cc:   Talmadge Coventry, M.D.  Fax: [phone]

## 2011-04-13 NOTE — H&P (Signed)
NAME:  [name] [name], [name] [name] NO.:  [id number]   MEDICAL RECORD NO.:  [id number]          PATIENT TYPE:  INP   LOCATION:  1829                         FACILITY:  [facility]   PHYSICIAN:  Elliot Cousin, M.D.    DATE OF BIRTH:  August 11, 1976   DATE OF ADMISSION:  02/18/2009  DATE OF DISCHARGE:                              HISTORY & PHYSICAL   PRIMARY CARE PHYSICIAN:  Dr. Gar Gibbon Mazzocchi   CHIEF COMPLAINT:  Shortness of breath and wheezing.   HISTORY OF PRESENT ILLNESS:  The patient is a 35 year old woman with a  past medical history significant for asthma, type 2 diabetes mellitus,  anemia, and hypertension.  She presents to the emergency department with  a chief complaint of shortness of breath and wheezing.  Her symptoms  actually started with a cough, productive with green and yellow sputum 1  week ago.  She also had subjective fever and chills.  She had no other  upper respiratory infection symptoms.  Over the past 2 days, she has  become more short of breath and she has developed worsening wheezing.  She has some associated chest pain with the cough and with her work of  breathing.  She has also felt her heart racing,  particularly after  she ambulates and with the breathing treatments at home.  The chest  pain is located centrally without any radiation.  There has been some  pleurisy.  She has had no associated diaphoresis, nausea, vomiting, or  diarrhea.  She has had no increase in swelling of one or both legs.  She  has used her inhaler and albuterol nebulizer at least six to eight times  daily to help relieve the shortness of breath and wheezing.  She  actually presented to the emergency department yesterday.  She was  treated with nebulizers and 125 mg of Solu-Medrol.  She was sent home on  a prednisone taper however she did not get the prescription filled.  Because symptoms did not abate, she presented again to the emergency  department today.   During the  evaluation in the emergency department, the patient is noted  to be afebrile, mildly hypertensive, and mildly tachycardic.  She is  oxygenating 94% on 2 to 4 liters of nasal cannula oxygen.  Her chest x-  ray reveals no active cardiopulmonary disease.  Her ABG on oxygen  reveals a pH of 7.44, pCO2 of 27.4, and pO2 of 70.0.  Because of her  persistent symptoms and failure of outpatient treatment, she will be  admitted for further evaluation and management.   PAST MEDICAL HISTORY:  1. Asthma with a history of status asthmaticus and acute respiratory      failure in July 2007 (not intubated).  2. Multiple ED visits for asthma exacerbations.  3. Morbid obesity.  4. Type 2 diabetes mellitus.  5. Gastroesophageal reflux disease.  6. Hypertension.  7. Anemia.  8. Nasal polyps (surgery was delayed in November 2009 because of      anemia).  9. Status post bilateral tubal ligation   MEDICATIONS:  1. The patient has not taken NovoLog or  Lantus since 2008 because she      felt that her capillary blood glucose would normalize off of      chronic prednisone.  2. Albuterol nebulizer every 3-4 hours as needed.  3. Nasonex 2 sprays in each nostril twice daily.  4. Symbcort 160/  45 mcg 2 puffs twice daily.  5. Potassium chloride question dose (over-the-counter) once daily.  6. Iron 325 mg twice daily.  7. Multivitamin with iron once daily.   ALLERGIES:  THE PATIENT IS ALLERGIC TO PENICILLIN WHICH CAUSES ABDOMINAL  PAIN AND NAUSEA.  SHE IS ALLERGIC OR INTOLERANT TO TETRACYCLINE, SHE  CANNOT RECALL THE REACTION.  SHE ALSO HAS AN INTOLERANCE TO VICKS COUGH  MEDICINE WHICH CAUSES NAUSEA AND VOMITING.  SHE HAS AN INTOLERANCE  AND/OR ALLERGY TO IBUPROFEN WHICH CAUSES SWELLING.  SHE IS ALSO  ALLERGIC TO SHELLFISH.   SOCIAL HISTORY:  The patient is single.  She has three children.  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  She denies tobacco, alcohol  and illicit drug use.  She receives  disability benefits.   FAMILY HISTORY:  Her mother is 80 years of age and has type 1 diabetes  mellitus.  Her father is 49 years of age and has degenerative joint  disease.  She has one sister who had heart valve surgery at 68 years of  age and had to have a redo late recently.   REVIEW OF SYSTEMS:  The patient's review of systems is as above in  history of present illness.  In addition, her review of systems is  positive for subjective fever and chills, constipation, and regular  menstrual periods.  Her last menstrual period was at the beginning of  March 2010; she says her menstrual blood flow is not particularly heavy.  Her review of systems is negative for bright red blood per rectum and  black stools.   PHYSICAL EXAMINATION:  Temperature  97.7, blood pressure 158/92, pulse  108, respiratory rate 20, oxygen saturation 94% on 3 liters of nasal  cannula oxygen.  GENERAL:  The patient is a pleasant obese 35 year old  African American woman who is currently sitting up in bed in some  respiratory distress.  However, improved per her history.  HEENT:  Head is normocephalic, nontraumatic.  Pupils equal, round,  reactive to light.  Extraocular muscles are intact.  Conjunctivae are  clear.  Sclerae are white.  Tympanic membranes are clear bilaterally.  Nasal mucosa is mildly dry.  No sinus tenderness.  No active rhinorrhea.  Oropharynx reveals fair dentition.  Mucous membranes are mildly dry.  No  posterior exudates or erythema.  NECK:  Supple.  No adenopathy, no thyromegaly, no bruit or JVD.  LUNGS:  Diffuse wheezes bilaterally  down to the bases.  Breathing is  mildly labored.  She is not using accessory muscles at this time.  She  is able to talk in complete sentences.  HEART:  S1-S2 with a soft systolic murmur and tachycardia.  ABDOMEN:  Obese positive bowel sounds, soft, nontender, nondistended.  No hepatosplenomegaly, no masses palpated.  GU/ RECTAL:  Deferred.  EXTREMITIES:  Pedal  pulses palpable bilaterally.  No pretibial edema.  No pedal edema.  NEUROLOGIC:  The patient is alert and oriented x3.  Cranial nerves II-  XII are intact.  Strength is 5/5 throughout.  Sensation is intact.   ADMISSION LABORATORIES:  Chest x-ray reveals no active cardiopulmonary  disease.  ABG on oxygen therapy reveals a  pH of 7.439, pCO2 of 27.4, pO2  of 70.0.  WBC 10.6, hemoglobin 8.3, hematocrit 28.3, MCV 58.4, platelets  412.  Sodium 139, potassium 3.0, chloride 107, glucose 236, BUN 9,  creatinine 0.9, CO2 19.   ASSESSMENT:  1. Asthma with exacerbation and associated chest pain.  The patient is      actively wheezing and moving air.  Symptomatically she is slightly      improved after the treatment given in the emergency department.      Her ABG reveals hyperventilation and therefore she is not retaining      CO2.  Her chest pain may be the consequence of an increase in work      of breathing and bronchospasms.  Her chest x-ray is free of an      infiltrate however with the off colored productive cough and with      concomitant steroid therapy, she will be treated with antibiotics      empirically.  Of note, the patient has had frequent visits to the      emergency department for asthma exacerbations and therefore, her      asthma is poorly controlled.  2. Type 2 diabetes mellitus.  The patient had not been taking NovoLog      and Lantus since 2008 as she felt that her diabetes was strictly      steroid-induced.  Therefore she stopped the NovoLog and Lantus      therapy when her capillary blood glucose began to normalize      according to her history.  However, she was seen recently by Dr.      Smith Mince  who apparently ordered a hemoglobin A1c and she was told      by Dr. Smith Mince that the hemoglobin A1c was consistent with      diabetes mellitus.  Per the patient's history, she was referred to      either a registered dietician or an endocrinologist for management      of her  diabetes mellitus; the appointment is pending.  Her venous      glucose is currently 236.  I expect her venous glucose to increase      on steroid therapy.  3. Hypertension.  The patient has also been noncompliant with      antihypertensive therapy.  She had been treated with Avalide in the      past.  She is currently taking no antihypertensive medications.  4. Hypokalemia.  The patient has a history of hypokalemia.  Her serum      potassium is 3.0.  Both steroids and beta agonists can cause      hypokalemia.  She has already received 2 grams of magnesium and      therefore magnesium deficiency cannot be assessed accurately.  5. Chronic microcytic anemia.  The patient's hemoglobin is currently      8.3.  It was 7.4 in January 2010.  According to her history, she      does not have dysfunctional uterine bleeding.  She does  not have a      history of sickle cell anemia or sickle cell trait as far as she      knows.  Dr. Smith Mince apparently referred her to either a      hematologist or gastroenterologist for further workup.  (The      patient cannot recall the name of the physician and or the type of      specialist she  was referred to.)   PLAN:  1. The patient is being admitted to the step-down unit for further      management.  2. The emergency department physician has already started treatment      with nebulizers, 125 mg of Solu-Medrol, and 2 grams of magnesium      sulfate IV.  3. Will continue Atrovent and Xopenex nebulizers q.4 h and then every      2 hours p.r.n..  We will also continue Solu-Medrol intravenously.  4. Will add Avelox empirically.  We will also add symptomatic and      supportive treatment with Mucinex and as-needed Robitussin.  5. Will treat the patient's diabetes mellitus with sliding scale      NovoLog and Lantus.  Will consult the registered dietician for      nutrition education.  Will check a hemoglobin A1c.  6. Will continue to replete the patient's  potassium chloride as she      was given 40  mEq orally by the emergency department physician.  7. Will check an anemia panel, guaiac her stools, and continue ferrous      sulfate b.i.d..  8. Will check cardiac enzymes, 2-D echocardiogram, and TSH for workup      of chest pain.      Elliot Cousin, M.D.  Electronically Signed     DF/MEDQ  D:  02/18/2009  T:  02/18/2009  Job:  [id number]   cc:   Gar Gibbon Dr. Smith Mince

## 2011-04-13 NOTE — Discharge Summary (Signed)
NAME:  [name] [name], [name] [name] NO.:  [id number]   MEDICAL RECORD NO.:  [id number]          PATIENT TYPE:  INP   LOCATION:  1317                         [facility]ACILITY:  [facility]   PHYSICIAN:  Peggye Pitt, M.D. DATE OF BIRTH:  1976/04/18   DATE OF ADMISSION:  04/06/2009  DATE OF DISCHARGE:  04/09/2009                               DISCHARGE SUMMARY   DISCHARGE DIAGNOSES:  1. Acute asthma exacerbation, resolved.  2. Morbid obesity.  3. Gastroesophageal reflux disease.  4. Nasal polyps.  5. Hypertension, newly diagnosed.  6. History of iron-deficiency anemia.Marland Kitchen   DISCHARGE MEDICATIONS:  1. Protonix 40 mg daily.  2. Prednisone 60 mg on Apr 10, 2009 and then decrease by 10 mg daily      until off.  3. Hydrochlorothiazide 25 mg daily.  4. Singulair 10 mg daily.  5. Symbicort 160/4.5 mcg 1 puff b.i.d.  6. Nasonex 2 puffs every 12 hours.  7. Albuterol MDI 2 puffs as needed for shortness of breath.  8. Multivitamin 1 tablet daily.  9. Ferrous sulfate 325 mg daily.   DISPOSITION:  [name] is discharged home today in stable condition.   FOLLOW UP:  She is instructed to follow up with her primary care  physician in about a week's time.  Of note, she has been newly started  on hydrochlorothiazide, hence, her blood pressure and renal function  will need to be monitored at her hospital followup appointment.   CONSULTATION:  None.   IMAGES/PROCEDURES PERFORMED:  1. Include a chest x-ray on Apr 06, 2009 that showed stable chest x-ray      with hyperaeration and no active lung disease.  2. A pelvic ultrasound on Apr 07, 2009 that shows possible adenomyosis      of the uterus.   HISTORY/PHYSICAL:  For full details, please refer to dictation by Dr.  Glade Lloyd on Apr 06, 2009, but in brief, [name] is a very pleasant 35-  year-old obese African American woman with a history of asthma and GERD,  who presented to the hospital with acute shortness of breath.  Because  her shortness  of breath and wheezing did not resolve with nebulizer  treatments in the ED, we were called to admit her for further evaluation  and management.   HOSPITAL COURSE:  1. Acute asthma exacerbation.  She was started on Singulair.  She was      kept on her Symbicort and albuterol nebs as needed.  She has been      started on a prednisone taper.  I believe that her GERD and      possibly postnasal drip also contribute to her asthma.  2. For her gastroesophageal reflux disease, she has been placed on      Protonix daily.  3. Hypertension which is a new diagnosis this hospitalization.  Her      blood pressures have ranged anywhere from 148/95 all the way to      177/106.  Given she has no other comorbid indications, we will      start her on hydrochlorothiazide 25 mg daily.  Because this  medication is newly started, she will need close followup with her      primary care physician to monitor her blood pressure and her renal      function on the newly started diuretic.  4. The rest of her chronic medical issues have not been a problem this      hospitalization.   Vital signs on day of discharge:  Blood pressure 177/106, heart rate 91,  respirations 20, O2 sat is 97% on room air with a temperature of 97.9.      Peggye Pitt, M.D.  Electronically Signed     EH/MEDQ  D:  04/09/2009  T:  04/09/2009  Job:  [id number]

## 2011-04-13 NOTE — Discharge Summary (Signed)
NAME:  [name] [name], MALICOAT NO.:  [id number]   MEDICAL RECORD NO.:  [id number]          PATIENT TYPE:  INP   LOCATION:  1229                         [facility]   PHYSICIAN:  Eduard Clos, MDDATE OF BIRTH:  [birth year]   DATE OF ADMISSION:  05/20/2009  DATE OF DISCHARGE:  05/21/2009                               DISCHARGE SUMMARY   HOSPITAL COURSE:  A 35 year old female with known history of bronchial  asthma, presented complaining of severe shortness of breath.  On  admission, the patient had a chest x-ray which only showed bibasilar  atelectasis.  No pneumothorax or pleural effusion.  The patient had  clinically been having expiratory wheezes.  Was admitted to ICU and  started on IV steroids, and empiric antibiotics.  The patient also was  found to be hyperglycemic.  Sliding-scale insulin was started.  Hemoglobin A1c was 5.5.   The patient's condition gradually improved.  At this time, the patient  is able to talk complete sentences without any difficulty.  Saturating  more than 95% on room air and is eager to go home.  The patient's  platelet counts were found to be decreased from 250-59 at this time.  I  have raised concern about this with the patient.  The patient states  that she has to go home as the patient's children have to be taken care  of, so I advised that I will do a HIT panel.  If it is positive, I will  call her back for which she is agreeing.  I also advised that she needed  to follow a CBC tomorrow, May 21, 2009 with her primary care physician,  Dr. Smith Mince along with a BMET for which also she agreed.  At this time  as the patient is clinically improved and bronchial asthma has resolved,  the patient will be discharged home.   DIAGNOSTICS:  Chest x-ray shows bibasilar atelectasis.   LABORATORY DATA:  Hemoglobin A1c 5.5, platelets dropped from 259-59.   FINAL DIAGNOSES:  1. Acute exacerbation of bronchial asthma.  2. Morbid  obesity.  3. Thrombocytopenia.  4. Hyperglycemia and leukocytosis from steroids.   DISCHARGE MEDICATIONS:  1. Symbicort 160/4.5 one inhalation b.i.d.  2. Albuterol nebulizer q.6 h. p.r.n.  3. Advair HFA 2 puffs q.6 h. p.r.n.  4. Azithromycin 250 mg p.o. daily for 4 days.  5. Omeprazole 40 mg p.o. daily.  6. Prednisone tapering dose.   PLAN:  Patient advised to follow up with primary care physician, Dr.  Smith Mince tomorrow, May 22, 2009 to recheck CBC and BMET.  If her HIT  panel is becoming positive, I will be calling her back.  She has  provided her number, [phone].  The patient to be compliant with her  medications.      Eduard Clos, MD  Electronically Signed     ANK/MEDQ  D:  05/21/2009  T:  05/21/2009  Job:  [id number]   cc:   Talmadge Coventry, M.D.  Fax: [phone]

## 2011-04-13 NOTE — Discharge Summary (Signed)
NAME:  [name] [name], [name] [name] NO.:  [id number]   MEDICAL RECORD NO.:  [id number]          PATIENT TYPE:  INP   LOCATION:  4733                         FACILITY:  [facility]   PHYSICIAN:  Ruthy Dick, MD    DATE OF BIRTH:  07-Sep-1976   DATE OF ADMISSION:  03/23/2009  DATE OF DISCHARGE:  03/25/2009                               DISCHARGE SUMMARY   REASON FOR ADMISSION:  Asthma exacerbation.   FINAL DISCHARGE DIAGNOSES:  1. Acute asthma exacerbation.  2. Acute bronchitis.  3. Morbid obesity.  4. Iron-deficiency anemia.  5. Nasal polyps.   CONSULT DURING THIS ADMISSION:  None.   PROCEDURE DURING THIS ADMISSION:  None.   BRIEF HISTORY OF PRESENT ILLNESS:  This is a 35 year old African  American female with history of morbid obesity, asthma, who came in with  worsening shortness of breath.  The chest x-ray did not reveal any  infiltrates.  No signs of infection.  She was admitted and managed with  steroids, antibiotics, and nebulizations.  She did well on this regimen  and today, she is very stable with no more wheezing and said she wants  to go home.  She also mentions that she has a family emergency that she  needs to go to.   PHYSICAL EXAMINATION:  LUNGS:  She has no wheezing in her lungs.  ABDOMEN:  Soft and nontender.  EXTREMITIES:  No clubbing, no cyanosis, and no edema.  CARDIOVASCULAR:  First and second heart sounds only.  For morbid  obesity, we have recommended exercise and diet and we have recommended  that she goes up to her primary care physician to have arrangement made  for her to have surgery for the nasal polyps.  In any case, she is  stable today to go home.  VITAL SIGNS:  Temperature 97.3, pulse 79, respirations 20, blood  pressure 100/40, and saturating 97% on 2 L.  CHEST:  Clear to auscultation bilaterally.   She is to go home on all her outpatient medications and these are:  1. Symbicort 2 puffs twice daily.  2. Nasonex 2 puffs intranasally  twice daily.  3. Albuterol inhaler 2 puffs as needed.  4. Multivitamins 1 tablet daily.  5. Albuterol nebulization.  6. Iron tablet once daily.  7. She is also to go home on prednisone 20 mg p.o. b.i.d. for 2 days,      20 mg p.o. daily for 2 days, and 10 mg p.o. daily for 2 days.  8. Protonix 40 mg p.o. daily for 1 week.  9. Zithromax 500 mg p.o. daily for 3 days.   The patient is to follow up with Dr. Talmadge Coventry in 1 week and  according to her, there is an appointment for next week and I have asked  her to make this appointment.  She is to continue nebulizations at home.  Return to the emergency room for any recurrence or worsening of  symptoms.   TIME USED FOR DISCHARGE PLANNING:  Less than 30 minutes.      Ruthy Dick, MD  Electronically Signed     GU/MEDQ  D:  03/25/2009  T:  03/26/2009  Job:  [id number]

## 2011-04-13 NOTE — Discharge Summary (Signed)
NAME:  [name] [name], DEMOS NO.:  [id number]   MEDICAL RECORD NO.:  [id number]          PATIENT TYPE:  INP   LOCATION:  3310                         FACILITY:  [facility]   PHYSICIAN:  Monte Fantasia, MD  DATE OF BIRTH:  10/17/1976   DATE OF ADMISSION:  06/27/2009  DATE OF DISCHARGE:  06/30/2009                               DISCHARGE SUMMARY   PRIMARY CARE PHYSICIAN:  Talmadge Coventry, MD   DISCHARGE DIAGNOSES:  1. Severe asthma exacerbation.  2. Iron-deficiency anemia.  3. Hypokalemia, which is resolved.  4. Morbid obesity.  5. Uncontrolled blood sugar secondary to steroids.   MEDICATIONS UPON DISCHARGE:  1. Prednisone 60 mg p.o. daily for 3 days, then 40 mg p.o. daily for 3      days, then 20 mg p.o. daily for 3 days, and then 10 mg p.o. daily      for 3 days, and then discontinue.  2. Ferrous sulfate 325 mg p.o. b.i.d.  3. Lantus 15 units subcutaneous at bedtime.  4. Mucinex 600 mg p.o. b.i.d.  5. Avelox 400 mg p.o. daily for 5 days.  6. Albuterol MDI 2 puffs inhalations q.6 h p.r.n. shortness of breath.   COURSE DURING THE HOSPITAL STAY:  [name] [name] is a 35 year old morbidly  obese, pleasant, African American lady.  The patient was admitted on  June 27, 2009, with complaints of increasing shortness of breath.  The  patient initially was admitted to the regular floor with her shortness  of breath and started on IV Solu-Medrol with bronchodilators.  Her  condition over the next 24 hours deteriorated with increasing shortness  of breath and severe bronchoconstriction and the patient had to be  transferred to NICU for closer monitoring for possible intubation.  The  patient received continuous nebulizations and steroid dose frequency was  increased.  The patient symptomatically improved better and was then  later transferred to the Step-Down Unit.  The patient was also started  on Avelox with her asthmatic attack.  At present, the patient is  symptomatically  much improved than past 2 days and as per the patient  wants to be discharged home due to family reasons.  However, the patient  has been explained that she does need at least another 24-48 hours of  hospitalization.  However, she has refused and wants to be discharged  home.  At present, we will discontinue the IV steroids and change it to  tapering dose of prednisone and Avelox has been changed to p.o. dose.   The patient was found to be anemic with H and H of 11.3.  The patient's  iron panel was done which showed iron-deficiency anemia and hence  received IV iron therapy for the same.  The patient is being discharged  on oral iron.  The patient would need an outpatient medical followup and  evaluation for her iron-deficiency anemia.   The patient's blood sugars were uncontrolled through the stay in the  hospital.  However, this has been secondary to the steroids which she  was started on.  At present, the patient was started on Lantus with 15  units at bedtime.  We would continue the same for next 2 weeks as long  as the patient is on steroids and then discontinue.  The patient would  need further follow up regarding her blood sugars as she would be high  risk for diabetes.  The patient has been advised weight loss regarding  her obesity and has verbalized understanding the same.   Hypokalemia was supplemented through the stay in the hospital.  At  present, the potassium has been well corrected to 3.8.   Leukocytosis was secondary to the steroid dose and is on a downward  course.  We would continue Avelox for 5 more days to complete the  course.   RADIOLOGICAL INVESTIGATIONS:  Done during the stay in the hospital.  Chest x-ray done on June 27, 2009, impression; coarse central  bronchovascular markings represent reactive airway disease, left basilar  atelectasis similar to prior.  Neck CT angio done on June 28, 2009,  impression; suboptimal pulmonary arterial opacification with  excessive  respiratory motion and artifact.  There is no evidence of any central  pulmonary embolus.   LABORATORY DATA:  Done during the stay in the hospital; total WBC 14.1,  hemoglobin 10.6, hematocrit 32.8, and platelets of 313.  Sodium of 136,  potassium 3.8, chloride 103, bicarb 25, glucose 229, BUN 6, and  creatinine 0.76.  HbA1c 6.1.  Mean plasma glucose 128.  Iron 11, TIBC  443, percent saturation 2, folate serum more than 20, and ferritin 7.   DISPOSITION:  The patient wants to be discharged home today.  The  patient would need to continue on her steroid dose with tapering doses  of steroids as recommended above.  Also, the patient has iron-deficiency  anemia, has received IV iron therapy through the stay in the hospital,  would continue on the p.o. iron therapy for at least 3 months.  Would  complete the course of antibiotics and would continue Lantus for better  control of sugars for at least 2 more weeks.  The patient would need to  follow up with her primary care physician within a week for her asthma  resolution and also for iron-deficiency anemia workup.  The patient has  been recommended the same and has verbalized understanding the same.  Total time for discharge counseling half an hour.      Monte Fantasia, MD  Electronically Signed     MP/MEDQ  D:  06/30/2009  T:  07/01/2009  Job:  [id number]   cc:   Talmadge Coventry, M.D.
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
## 2011-04-13 NOTE — Discharge Summary (Signed)
NAME:  [name] [name], [name] [name] NO.:  [id number]   MEDICAL RECORD NO.:  [id number]          PATIENT TYPE:  INP   LOCATION:  5511                         FACILITY:  [facility]   PHYSICIAN:  Isidor Holts, M.D.  DATE OF BIRTH:  [birth year]   DATE OF ADMISSION:  02/18/2009  DATE OF DISCHARGE:  02/21/2009                               DISCHARGE SUMMARY   PRIMARY CARE PHYSICIAN:  Talmadge Coventry, M.D.   DISCHARGE DIAGNOSES:  1. Acute bronchitis.  2. Infective exacerbation of bronchial asthma, secondary to #1 above.  3. Type 2 diabetes mellitus, steroid-induced.  4. Morbid obesity.  5. Hypertension.  6. Gastroesophageal reflux disease.  7. Nasal polyps.  8. Profound iron deficiency anemia, secondary to dysfunctional uterine      bleeding. Required 2 units of packed red blood cells/InFeD      infusion.   DISCHARGE MEDICATIONS:  1. Albuterol nebulizer one treatment p.r.n. q.4-6 hours.  2. Nasonex 2 sprays each nostril b.i.d.  3. Symbicort (160/45) two puffs b.i.d.  4. Ferrous sulfate 325 mg p.o. b.i.d.  5. Multivitamin with iron one p.o. daily.  6. Azithromycin 500 mg p.o. daily for 5 days (from February 22, 2009).  7. Prednisone 30 mg p.o. daily for 2 days, then 20 mg p.o. daily for 2      days, then 10 mg p.o. daily for 2 days, and then stop.  8. Avapro 150 mg p.o. daily.  9. NovoLog insulin per sliding scale as follows:  CBG 70-120 two      units, 121-150 three units, 151-200 four units, 201-250 seven      units, 251-300 eleven units, 301-350 fifteen units, 351-400 twenty      units.   Note:  Potassium chloride has been discontinued.   PROCEDURES:  1. Chest x-ray dated February 18, 2009 showed no active cardiopulmonary      disease.  2. Two-dimensional echocardiogram dated February 19, 2009 showed overall      left ventricular systolic was normal, LV EF 70%, left ventricular      wall thickness was at upper limits of normal, aortic valve was      mildly calcified.   CONSULTATIONS:  None.   HISTORY OF PRESENT ILLNESS:  As in H and P note of February 18, 2009,  dictated by Elliot Cousin, M.D.  However, in brief, this is a 35-year-  old female, with known history of bronchial asthma status post status  asthmaticus/acute respiratory failure in July of 2007, morbid obesity,  type 2 diabetes mellitus-diet controlled, GERD, hypertension, chronic  microcytic anemia secondary to dysfunctional uterine bleeding, history  of nasal polyps, status post bilateral tubal ligation, presenting with a  1-week history of cough productive of greenish/yellowish phlegm,  associated with worsening shortness of breath and wheezing particularly  over the past 2 days, not significantly ameliorated by frequent  utilization of home bronchodilators.  She was admitted for further  evaluation, investigation and management.   HOSPITAL COURSE:  1. Acute bronchitis.  For details of presentation, refer to the      admission history above.  Chest x-ray showed no evidence of  pneumonic consolidation.  The patient was managed with intravenous      Avelox, with significant amelioration of symptoms.  As of February 21, 2009, she was completely asymptomatic after completion of a 4-day      course of Avelox.  She has been transitioned to oral Azithromycin      for a further 5 days of antibiotic therapy.   1. Exacerbation of bronchial asthma.  This is secondary to #1 above.      The patient was managed with bronchodilator nebulizers, oxygen      supplementation, Mucinex and intravenous steroids.  By February 19, 2009, she had improved significantly.  She was transitioned to oral      steroid treatment in rapid taper.  As of February 21, 2009, the      patient's bronchial asthma was back to baseline.   1. Diabetes mellitus type 2.  The patient has a known history of type      2 diabetes mellitus, which is usually exacerbated by steroid      treatment.  It is currently diet controlled and  the patient has      reportedly not been on insulin or oral hypoglycemics since 2008.      Be that as it may, her blood glucose at the time of presentation      was significantly elevated at 236.  She was managed with      carbohydrate modified diet, sliding scale coverage and scheduled      Lantus insulin, with satisfactory control.  With tapering of her      steroids, her hyperglycemia improved significantly.  Hemoglobin A1C      during this hospitalization was normal at 5.8%.  The patient has      been reassured accordingly.  She has been recommended continuined      carbohydrate modified diet on discharge, and sliding scale insulin      coverage until tapered off steroids.  It is anticipated that she      will once again return to euglycemia on diet alone.  However,      should this not be the case the patient has been recommended to      follow up with her primary MD, who may choose to institute oral      hypoglycemic treatment or otherwise as indicated.   1. Severe iron deficiency anemia.  The patient has a known history of      chronic microcytic anemia, secondary to iron deficiency as a result      of dysfunctional uterine bleeding.  Her hemoglobin during this      hospitalization, by February 19, 2009, had dropped to 7.7 with the      hematocrit of 26.8 and MCV of 58.2.  She was therefore transfused 2      units of PRBC on that date, resulting in the satisfactory bump in      hemoglobin to 9.9 on February 20, 2009.  Iron studies showed the      following findings:  Iron 17, percent saturation 4, ferritin 4, B12      was normal at 389, folate was normal at over 20.  The patient was      given an InFeD infusion and has been continued on her iron      supplements on discharge.   1. History of GERD.  There were no problems referable to this.   1.  Hypertension.  This was controlled with ARB.   1. Dysfunctional uterine bleeding.  The patient, according to her, has      an appointment to  be seen in the GYN Clinic in April of 2010.  She      has been strongly encouraged to keep this appointment.   1. Morbid obesity.  The patient is morbidly obese and TSH during this      hospitalization was found to be normal at 0.426.  Lipid profile was      as follows:  Total cholesterol 131, triglycerides 88, HDL 44, LDL      76, i.e., excellent lipid profile.  She has been reassuring      accordingly.   DISPOSITION:  The patient was on February 21, 2009, asymptomatic, felt  considerably better, was very keen to be discharged.  She was therefore  discharged accordingly.   DIET:  Heart healthy/carbohydrate modified.   ACTIVITY:  As tolerated.   FOLLOWUP:  The patient is to follow up with her primary MD, Dr.  Smith Mince, in the coming week and was instructed to call for an  appointment.  In addition, she has been strongly urged to keep her  scheduled GYN appointment in April of 2010.  All of this has been  communicated to the patient and she has verbalized understanding.      Isidor Holts, M.D.  Electronically Signed     CO/MEDQ  D:  02/21/2009  T:  02/21/2009  Job:  [id number]   cc:   Talmadge Coventry, M.D.

## 2011-04-16 ENCOUNTER — Emergency Department (HOSPITAL_COMMUNITY)
Admission: EM | Admit: 2011-04-16 | Discharge: 2011-04-16 | Disposition: A | Discharge status: Home / Self Care | Payer: Medicare Other | Attending: Emergency Medicine | Admitting: Emergency Medicine

## 2011-04-16 DIAGNOSIS — R000 Tachycardia, unspecified: Secondary | ICD-10-CM | POA: Insufficient documentation

## 2011-04-16 DIAGNOSIS — R0602 Shortness of breath: Secondary | ICD-10-CM | POA: Insufficient documentation

## 2011-04-16 DIAGNOSIS — R609 Edema, unspecified: Secondary | ICD-10-CM | POA: Insufficient documentation

## 2011-04-16 DIAGNOSIS — T7840XA Allergy, unspecified, initial encounter: Secondary | ICD-10-CM | POA: Insufficient documentation

## 2011-04-16 DIAGNOSIS — J9801 Acute bronchospasm: Secondary | ICD-10-CM | POA: Insufficient documentation

## 2011-04-16 DIAGNOSIS — X58XXXA Exposure to other specified factors, initial encounter: Secondary | ICD-10-CM | POA: Insufficient documentation

## 2011-04-16 NOTE — Letter (Signed)
November 02, 2006    Ms. [name] [name]  [address]  [place], [place] [address]   RE:  [name], [name]  MRN:  [id number]  /  DOB:  12-Jun-1976   Dear [name]:   I reviewed your chart today and found that you have missed your last two  pulmonary appointments. You had missed your prior two appointments with  Dr. Artist Pais.   We take very seriously your health care and reserve a slot for you to be  seen in a timely manner at the time you schedule an office visit with  [place]. Therefore, this time is not available for other patients.   More importantly than this, however, is that I am very concerned that  you are not getting adequate medical care for your chronic condition of  asthma that needs much more attention to detail than what we can provide  seeing you sporadically. This type of care results in a much higher risk  to you of death from asthma.   As soon as possible call to schedule another appointment, but if you  cannot make the appointment for any reason call us to reschedule it in a  timely manner. Otherwise, I will be forced to ask you to find another  source for your medical care. I will expect to hear from you within the  next four weeks of this letter or at that point consider you discharged  from [facility] responsibility.    Sincerely,     Casimiro Needle B. Sherene Sires, MD, [facility]  Electronically Signed   MBW/MedQ  DD: 11/02/2006  DT: 11/03/2006  Job #: [id number]

## 2011-04-16 NOTE — Consult Note (Signed)
NAME:  [name] [name], [name] [name] NO.:  [id number]   MEDICAL RECORD NO.:  [id number]         PATIENT TYPE:  INP   LOCATION:  5707                         FACILITY:  [facility]   PHYSICIAN:  Clinton D. Maple Hudson, M.D. DATE OF BIRTH:  1975-12-03   DATE OF CONSULTATION:  03/12/2006  DATE OF DISCHARGE:                                   CONSULTATION   PROBLEM:  Asthma with exacerbation.   HISTORY:  A 35 year old African-American woman with chronic asthma for which  she is followed by Dr. Sherene Sires.  She was seen today at the request of Dr.  Quita Skye in the [facility] Emergency Room for exacerbation of asthma.  She  feels that she had caught a cold, and she presented to the emergency room  for treatment yesterday, received routine management with nebulizers and  Solu-Medrol, and was discharged with a prescription for prednisone taper.  She filled this taper but did not take it, because she was afraid that she  might choke on the pills while she was still wheezing.  Instead, she  returned to the emergency room today (Saturday), having used her home  nebulizer, received nebulizer treatments in the ambulance, and treated again  in the emergency room with partial improvement.  I was called down to admit  her, but she declined admission, stating that she has small children at home  and has been unable to get any of the neighbors to watch over them.   REVIEW OF SYSTEMS:  She denies pain, nausea, vomiting, change in bowel or  bladder, or syncope.  Sputum is white to slightly brown and scant.  She does  not have a sore throat.   PAST MEDICAL HISTORY:  Chronic asthma never requiring intubation,  hypertension for which she has a prescription but has recently run out,  diabetes, insulin-dependent.  She denies heart disease.   HOME MEDICATIONS:  Nebulizer with Xopenex and Atrovent, albuterol rescue  inhaler, NovoLog, Actos, Avalide.   ALLERGIES:  SHELLFISH AND IBUPROFEN.   FAMILY HISTORY:   Noncontributory at this time.   OBJECTIVE:  VITAL SIGNS:  Initial temperature 98.4, BP 171/98, pulse 160,  respirations 32, saturation 93% at time of arrival.  Pulse by me now is more  like 100 and regular.  GENERAL APPEARANCE:  Obese, calm, African-American woman, conversational  lying on stretcher without coughing or use of accessory muscles.  SKIN:  No rash seen.  ADENOPATHY:  None at the neck, shoulders, or axillae.  HEENT:  Oral mucosa is clear.  No neck vein distention.  No stridor.  CHEST:  Heart sounds are regular without murmur or gallop.  Bilateral  inspiratory and expiratory wheeze in the lung bases.  No rhonchi.  Fair air  flow bilaterally.  ABDOMEN:  Softly obese.  No bruit.  I cannot feel liver or spleen.  EXTREMITIES:  No edema.  No tremor.   IMPRESSION:  1.  Severe asthma with exacerbation, unstable, with increased risk of asthma      death or related complication.  2.  The patient has been educated about her risk status and my concerns but  emphasizes her primary responsibility is to arrange childcare.  We      reviewed her medications, noting that she does have prednisone.  She      agreed to try swallowing a prednisone dose here, accepted an antibiotic      prescription, and an additional neb treatment.  She is going to be      released home on her own responsibility, to call our office early Monday      morning for a work-in appointment for follow-up.  Addendum: She was able to arrange childcare and chose to be admitted. The  consultation is changed to an admission to the service of Dr. Sherene Sires.      Clinton D. Maple Hudson, M.D.  Electronically Signed     CDY/MEDQ  D:  03/12/2006  T:  03/13/2006  Job:  [id number]   cc:   Charlaine Dalton. Sherene Sires, M.D. [facility]  [address]  [place]  [place] [address]

## 2011-04-16 NOTE — Discharge Summary (Signed)
NAME:  [name] [name], MOWBRAY NO.:  [id number]   MEDICAL RECORD NO.:  [id number]          PATIENT TYPE:  INP   LOCATION:  1315                         FACILITY:  [facility]   PHYSICIAN:  Hettie Holstein, D.O.    DATE OF BIRTH:  1975-12-10   DATE OF ADMISSION:  09/16/2006  DATE OF DISCHARGE:  09/19/2006                                 DISCHARGE SUMMARY   PRIMARY CARE PHYSICIAN:  The patient has been recently referred to Dr. Artist Pais  of [facility]. She has an appointment on October 04, 2006.  She also follows up with Dr. Sherene Sires for pulmonary issues.   REASON FOR ADMISSION:  Worsening shortness of breath.   PRINCIPAL DIAGNOSIS:  Asthmatic exacerbation.   SECONDARY DIAGNOSES:  1. Poorly controlled diabetes with hemoglobin A1c of 7.0 and elevated      blood glucoses while on steroid therapy.  She is initiating glucophage      at 500 mg twice daily in addition to Lantus with parameters to adjust      in this period of steroid taper.  She will continue her q. a.c. Novolog      R she was on prior to admission.  2. Poorly controlled hypertension.  She will continue her Avalide as she      was on prior to admission.  Initially she is being initiated on      Norvasc.  3. Morbid obesity.  4. Gastroesophageal reflux disease.  5. Hypokalemia treated during this hospitalization with potassium      supplementation.  6. Microcytic anemia.   DISCHARGE MEDICATIONS:  1. She will continue her Xopenex nebulizers as she was on prior to      admission.  2. In addition she will continue her Avalide 300 mg/12.5 on a daily basis.  3. She will complete a course of Zithromax 250 mg to conclude on September 21, 2006.  4. She can utilize over-the-counter Senokot for constipation.  5. In addition she will resume her Symbicort as she was on prior to      admission at 160/4.5 two puffs twice daily.  Dispense one unit.  6. Prednisone taper starting at 40 mg to taper by 10 mg every three  days.  7. Lantus 10 units subcu at bedtime with instructions to continue to      increase by one unit each time until her a.m. blood glucose is less      than 100.  She was provided with hypoglycemia instructions.  8. Humulin R 3 units subcu a.c.   She was discharged in medically stable and improved condition to follow up  with Dr. Artist Pais for new patient visit on October 04, 2006.  She was also asked  to follow up with Dr. Sherene Sires and call to schedule an appointment.  Ideally she  could follow-up earlier with Dr. Artist Pais if this can be arranged to assure that  her blood pressure is under control.  Would recommend also follow-up her  liver function tests as she is being initiated on Glucophage.   HISTORY OF PRESENT ILLNESS:  For  full details, please refer to the H&P as  dictated by Della Goo, M.D.  However, briefly, Ms. [name] [name] is a 35-  year-old female with history of asthma, who was brought to the emergency  department via EMS secondary to worsening shortness of breath.  She had  increasing shortness of breath over three days with congestion and greenish  productive sputum.  She denied any fever or chills.  She reported using her  nebulizers without relief.  She has run out of her antihypertensive and  diabetes medications for approximately three weeks.  She was evaluated in  the emergency department room physician, treated with nebulizers and  directed for admission.  She almost left against medical advice secondary to  social issues which her children were without someone to watch them.  She  subsequently agreed to stay.  She was admitted for treatment.   HOSPITAL COURSE:  The patient remained in the hospital, was admitted to the  medical surgical floor, placed on IV Solu-Medrol nebulizer treatments.  She  did have a microcytic anemia with MCV of 67 though no reports of gross blood  in her stool.  I recommended that she have a repeat as she is a couple of  weeks out of her FDLMP.  I will  discharge her on iron supplementation and  recommend that she follow up with Dr. Artist Pais for anemia work-up if this  persists.  Ms. Coryell hospital course was that of marked improvement with  the steroid therapy.  She will benefit from early follow-up with her new  primary care physician Dr. Artist Pais.      Hettie Holstein, D.O.  Electronically Signed     ESS/MEDQ  D:  09/19/2006  T:  09/19/2006  Job:  [id number]   cc:   Charlaine Dalton. Sherene Sires, MD, FCCP  [address]  [place]  [place] [address]   Barbette Hair. [place], DO  [address] [place], [place] [address]

## 2011-04-16 NOTE — Discharge Summary (Signed)
NAME:  [name] [name], SARVIS            ACCOUNT NO.:  [id number]   MEDICAL RECORD NO.:  [id number]          PATIENT TYPE:  INP   LOCATION:  1411                         FACILITY:  [facility]   PHYSICIAN:  Corinna L. Lendell Caprice, MDDATE OF BIRTH:  1976/04/05   DATE OF ADMISSION:  08/07/2005  DATE OF DISCHARGE:                                 DISCHARGE SUMMARY   DIAGNOSES:  1.  Acute bronchitis.  2.  Asthma exacerbation.  3.  Uncontrolled diabetes.  4.  Hypertension.  5.  Gastroesophageal reflux disease.  6.  Obesity.   DISCHARGE MEDICATIONS:  1.  Avelox 400 mg daily for 5 more days.  2.  Glipizide ER 10 mg a day.  3.  Metformin 1000 mg twice a day.  4.  Prednisone taper as directed.  5.  Avalide 300 mg a day.  6.  Actos 45 mg a day.  7.  Protonix 40 mg a day.  8.  Atrovent and Xopenex nebulizers four times a day.  9.  Advair 250/50 one puff twice a day.  10. Singulair 10 mg a day.   FOLLOW-UP:  1.  With [facility] within 1-2 weeks.  2.  Follow up with primary care physician of choice within 1-2 weeks. She      has been given a referral to the doctor referral line.   CONDITION:  Stable.   DIET:  Low salt diabetic.   ACTIVITY:  Ad lib.   CONSULTATIONS:  None.   PROCEDURES:  None.   PERTINENT LABORATORY:  Iron was 24, ferritin 19, percent saturation 5, TIBC  438. Basic metabolic panel on admission was significant for a potassium of  3.3, glucose of 187, otherwise unremarkable. CBC was significant for a  hemoglobin of 11.2, hematocrit 33.9, MCV was 78, otherwise unremarkable. ABG  on unknown amount of oxygen on admission showed a pH of 7.44, PCO2 41, PO2  162.   SPECIAL STUDIES AND RADIOLOGY:  Chest x-ray showed nothing acute.   HISTORY AND HOSPITAL COURSE:  [name] [name] is a very pleasant 35 year old  black female who recently moved to [facility] from [place] who presented  to the emergency room with worsening asthma. She had cough productive of  yellowish-greenish  sputum. She denied any fevers or chills. She was wheezing  more. She had run out of the prednisone that she was on chronically (10 mg).  She has no primary care physician or pulmonologist yet. She has had  apparently multiple hospitalizations for asthma exacerbations in the past.  Please see H&P for complete details.   On exam, she had initially a heart rate of 139, initially normal temperature  of 97.3, initial blood pressure was 156/101, respiratory rate was 36, oxygen  saturation 97% on 2 L. She was in some mild respiratory distress when seen  by Dr. Rito Ehrlich and had diffuse expiratory wheezing with poor airway  exchange. The patient was admitted to the floor for acute bronchitis and  asthma exacerbation. She was started on Avelox and IV steroids, as well as  the nebulizers. Her sugars were quite elevated. She reports that at home  they are also  not well controlled. Her metformin was increased to 1000 mg a  day. She is to continue her Actos, and glipizide ER has been added, 10 mg a  day. This will need follow-up with her primary care physician. At the time  of discharge, the patient was feeling at her baseline. She was requesting to  go home. She had an occasional mild expiratory wheeze but was ambulating the  hallways without difficulty or oxygen. As her asthma is quite severe, I have  referred her to pulmonology as an outpatient.   Also, her iron was low and she is being advised to take iron  supplementation.      Corinna L. Lendell Caprice, MD  Electronically Signed     CLS/MEDQ  D:  08/09/2005  T:  08/10/2005  Job:  [id number]   cc:   [facility]

## 2011-04-16 NOTE — H&P (Signed)
NAME:  MCKENZEE, BEEM NO.:  [id number]   MEDICAL RECORD NO.:  [id number]          PATIENT TYPE:  INP   LOCATION:  1424                         FACILITY:  [facility]   PHYSICIAN:  Sherin Quarry, MD      DATE OF BIRTH:  [birth year]   DATE OF ADMISSION:  11/12/2005  DATE OF DISCHARGE:                                HISTORY & PHYSICAL   [name] [name] is a 35 year old lady with a history of asthma that has  been especially severe over the last four years. She is hospitalized  frequently in her home which is in [place], [place]. Recently, she  has come to [place] to live with her family here. Her last  hospitalization in [place] was one month ago. She was in the hospital at  that time for approximately 3 days. She was discharged on a steroid taper  and apparently did well until about two days ago when she began to notice  that she was increasingly short of breath with exertion. Then this morning,  she awakened and felt extremely congested, her chest was tight and she noted  expiratory wheezing. She had increased cough. She presented to [facility]  Emergency Room. In the emergency room, she was noted to have a temperature  99.4, blood pressure 170/90, her heart rate was 130. An arterial blood gas  was obtained which on room air showed a pO2 of 53, pCO2 of 43, pH 7.41. The  patient was administered Solu-Medrol 125 milligrams and was given albuterol  nebulizer treatment but it quickly became apparent that she was not going to  get better quickly and therefore decision was made to admit her to the  hospital for further treatment. She denies any associated headache, chest  pain or abdominal pain. She has been feeling nauseated today. She admits  that she has not taken her blood pressure medicine for approximately one  week.   PAST MEDICAL HISTORY:  Medications consist of albuterol metered-dose inhaler  which she uses frequently, Xopenex nebulizer which she uses  on a regular  basis, Advair 50-250 1 puffs b.i.d., Actos which she takes for her blood  sugar (she is not sure of the exact dose). She also takes metformin 1 tablet  daily and Avalide 300 milligrams daily which she ran out of a week ago.   ALLERGIES:  She reports that she is allergic to IBUPROFEN.   MEDICAL ILLNESSES:  1.  Asthma, see above.  2.  Diabetes: The patient states that she is thought to have but diabetes      exacerbated by weight gain and by steroid usage.  3.  Hypertension:  This was first diagnosed about three years ago. She      states that she has not had any operations. She has had previous normal      childbirth.   FAMILY HISTORY:  The patient's parents live in [place]. Her mother has  diabetes. Her father is in good health. His family has several family  members with a history of heart disease. She states her sister has had some  type of heart surgery, possibly  for a heart valve.   SOCIAL HISTORY:  She indicates she does not smoke. She denies abuse of  alcohol or drugs.   REVIEW OF SYSTEMS:  Otherwise negative.   PHYSICAL EXAM:  Her temperature is 99.4, blood pressure is 170/90, pulse  130, respirations were 30. She has marked expiratory wheezing. HEENT exam is  within normal limits. The chest is remarkable for markedly diminished breath  sounds. Expiratory wheezes are audible. Cardiovascular exam reveals a  tachycardia. There is normal S1-S2 without rubs, murmurs or gallops. The  abdomen is obese. Normal bowel sounds without masses, tenderness,  organomegaly. Examination of the extremities reveals a 2+ pretibial edema.   IMPRESSION:  1.  Severe asthmatic episode with acute exacerbation.  2.  Diabetes.  3.  History of hypertension.  4.  Chronic prednisone usage.   PLAN:  This patient is having a significant amount of difficulty and I think  we should go ahead and admit her to a step-down unit so we can monitor  status closely. It is conceivable that she  may need BiPAP if she tires. We  will administer nebulizer treatments, IV steroids, empiric antibiotics,  follow her blood sugar with a sliding scale insulin. Note that her potassium  levels 2.8 and this will need to be corrected. She is apparently seen Dr.  Sung Amabile at one point in the past and it would probably be a good idea to  make sure that she has follow-up arranged either with the pulmonary doctors  or with a primary care doctor.           ______________________________  Sherin Quarry, MD     SY/MEDQ  D:  11/12/2005  T:  11/13/2005  Job:  [id number]

## 2011-04-16 NOTE — Discharge Summary (Signed)
NAME:  CHABELY, NORBY NO.:  [id number]   MEDICAL RECORD NO.:  [id number]          PATIENT TYPE:  INP   LOCATION:  6715                         FACILITY:  [facility]   PHYSICIAN:  Lonzo Cloud. Kriste Basque, M.D. [facility] OF BIRTH:  Jul 11, 1976   DATE OF ADMISSION:  05/04/2006  DATE OF DISCHARGE:  05/07/2006                                 DISCHARGE SUMMARY   FINAL DIAGNOSES:  1.  Admitted May 04, 2006 by Dr. Delford Field with status asthmaticus.  Treated      with IV Solu-Medrol, IV Avelox, inhaled bronchodilators, etc.  Weaned to      p.o. medication, and the patient insisted on early discharge to care for      her children at home.  2.  History of chronic noncompliance with medical regimen.  3.  Hypertension.  4.  Diabetes mellitus.  5.  Morbid obesity.  6.  Gastroesophageal reflux disease.  7.  History of allergy to shellfish and nonsteroidal anti-inflammatory      agents including ibuprofen.   BRIEF HISTORY AND PHYSICAL:  The patient is a 35 year old African-American  female followed by Dr. Sherene Sires.  She presented to the emergency room with  increasing shortness of breath and cough.  She had been hospitalized in  April of 2007 with a similar asthma exacerbation but did not return for  office follow-up.  She has underlying asthma, morbid obesity, type 2  diabetes, gastroesophageal reflux disease, hypertension, and a long history  of medication non-adherence.   PRIMARY CARE PHYSICIAN:  Her primary care physician is listed as Dr. Artist Pais.   PHYSICAL EXAMINATION:  GENERAL:  Physical examination at the time of  admission revealed a morbidly obese 35 year old black female in mild  distress with wheezing.  VITAL SIGNS:  Blood pressure 156/56, pulse 110 and regular, respirations 22  per minute and shallow, temperature 98 degrees.  HEENT:  Unremarkable.  NECK:  No jugular distension, no carotid bruits, palpable thyroid without  nodules.  CHEST:  Inspiratory and expiratory wheezing with poor  air flow.  CARDIAC:  A resting tachycardia, normal S1-S2.  No murmurs, rubs or gallops  heard.  ABDOMEN:  Obese but soft and nontender without evidence of organomegaly or  masses.  EXTREMITIES:  No cyanosis, clubbing or edema.  NEUROLOGIC:  Intact.   LABORATORY DATA:  EKG showed sinus tachycardia, nonspecific ST-T wave  changes.  Chest x-ray showed over-inflation, no infiltrates.  Arterial blood  gases showed a pH of 7.32, pCO2 of 37, PO2 of 86 on supplemental oxygen in  the emergency room.  Hemoglobin 10.9, hematocrit 34.0, white count 17,100  with 90% segs.  Sodium 135, potassium 3.3, chloride 106, CO2 of 20, BUN 11,  creatinine 1.0, blood sugar 326, AST 46, ALT 36, alkaline phosphatase 63,  total bilirubin 0.9, myoglobin 96 with negative MB and negative troponin.   HOSPITAL COURSE:  The patient was admitted by Dr. Delford Field with status  asthmaticus in Dr. Thurston Hole absence.  She was placed on nebulizer treatments  with albuterol.  Given IV Solu-Medrol IV Avelox.  Given Lovenox and BiPAP  overnight.  Her sugars were watched  carefully.  She was covered with Lantus  and sliding scale Humalog.  She responded to this treatment, and nebulizer  treatments were changed to Xopenex.  Her Solu-Medrol was changed in the  favor of oral prednisone, and she was allowed to ambulate in the hallway  without desaturation.  She was started on Qvar and Foradil here in the  hospital, as this was felt to be a good regimen for at home.   When seen on May 07, 2006, she no longer had any active wheezing.  Her peak  flow was checked and was measured at 200 (normal placed on her age and  height was 490, and the lower limits of the yellow zone would be 245).  The  patient was quite insistent on discharge home, as she has a relative caring  for her 3 children, and the relative has to go back to work, and she refused  to stay any longer to see if she had continued improvement.   MEDICATIONS AT DISCHARGE:  1.   Prednisone 40 mg a day with a tapering schedule.  2.  Qvar 3 puffs b.i.d.  3.  Foradil one capsule inhaled twice a day.  4.  Albuterol two sprays every 4-6 hours as needed.  5.  Avelox 1 tablet a day until gone.  6.  Avalide 300/12.5 one a day as before.  7.  NovoLog insulin 10 units before each meal.  8.  Actos 45 mg daily.  9.  Nexium 40 mg p.o. daily.   FOLLOWUP:  1.  She will follow with Dr. Sherene Sires in 1 week in the office.  2.  She will follow with Dr. Artist Pais in 2 weeks.   CONDITION ON DISCHARGE:  Improved.      Lonzo Cloud. Kriste Basque, M.D. [facility]  Electronically Signed     SMN/MEDQ  D:  05/07/2006  T:  05/08/2006  Job:  [id number]   cc:   Charlaine Dalton. Sherene Sires, M.D. [facility]  [address]  [place]  [place] [address]

## 2011-04-16 NOTE — Assessment & Plan Note (Signed)
[facility]                               PULMONARY OFFICE NOTE   [name] [name], [name] [name]                     MRN:          [id number]  DATE:                                      DOB:          02-10-1976    PULMONARY/EXTENDED POST HOSPITAL FOLLOWUP OFFICE VISIT   HISTORY:  This patient was admitted from July 31 to August 2 with status  asthmaticus and barely escaped mechanical ventilation.  She had evidence of  chronic but not acute sinusitis by CT scan, with an IgE level of 139 when  admitted.  She could not afford co-pays to come to the office, and that is  why she was not keeping up with visits.  By the time of discharge from the  hospital on August 2, she was markedly improved to the point where she was  able to ambulate without dyspnea on room air.  She was taught how to use  MDIs appropriately, and approached 95% effectiveness with Symbicort 160/4.5  2 puffs b.i.d., and was also given a prednisone taper, asked to continue  PPIs in the form of Prilosec 20 mg before breakfast.  She says she has only  used Xopenex twice since she got home, and overall ranks herself as 80% back  to her normal level.  She denies any nocturnal wheeze or morning cough,  purulent sputum, chest pain, fevers, chills, orthopnea, PND or leg swelling.   For a full inventory of her medications, please see the patient sheet column  dated July 07, 2006.   PHYSICAL EXAMINATION:  GENERAL:  She is an obese, ambulatory black female in  no acute distress.  VITAL SIGNS:  Afebrile, stable vital signs, with a weight of 307 pounds,  which is no change in baseline.  HEENT:  Unremarkable, oropharynx is clear, no thrush.  NECK:  Supple without cervical adenopathy or tenderness.  The trachea was  midline, no thyromegaly.  LUNGS:  Lung fields reveal a few end-expiratory rhonchi, but overall air  movement is adequate.  CARDIAC:  There is a regular rhythm without murmur, gallop or rub.  ABDOMEN:  Obese but otherwise benign.  EXTREMITIES:  Warm without calf tenderness, cyanosis, clubbing or edema.   FEV1 is 1.5 with a ratio of 62% on spirometry.   IMPRESSION:  Severe chronic asthma with long-term noncompliance, may have  resulted in a fixed component, although I believe most of her disease is  reversible.  I therefore asked her to take prednisone 20 mg per day with  breakfast until 100% back to baseline, and then 10 mg per day for 3 days,  and then 5 mg for 3 days, then stop it.   I reviewed each and every one of her medicines, including optimal MDI use,  and documented that she still approaches 90% effectiveness with Symbicort,  and asked her to continue to take it 2 puffs b.i.d. with a goal of  minimizing the need for Xopenex.   I also added Zegerid 40 mg at bedtime because I suspect that Prilosec by  itself, given  her obesity, is not going to be adequate, and asked her to  continue to take the Prilosec daily, as well as instructed her on dietary  and lifestyle modifications that will help reduce the likelihood of GERD  interfering with asthma control.   FOLLOWUP:  She has agreed to see me back every two weeks for followup,  sooner if she needs increased need for Xopenex over baseline.                                   Charlaine Dalton. Sherene Sires, MD, [facility]   MBW/MedQ  DD:  07/07/2006  DT:  07/07/2006  Job #:  [id number]

## 2011-04-16 NOTE — Discharge Summary (Signed)
NAME:  [name] [name], [name] [name] NO.:  [id number]   MEDICAL RECORD NO.:  [id number]          PATIENT TYPE:  INP   LOCATION:  1416                         FACILITY[facility]   PHYSICIAN:  Gordy Savers, MDDATE OF BIRTH:  [birth year]   DATE OF ADMISSION:  10/17/2006  DATE OF DISCHARGE:  10/19/2006                               DISCHARGE SUMMARY   FINAL DIAGNOSIS:  Urinary tract infection with early urosepsis.   ADDITIONAL DIAGNOSES:  1. Asthma.  2. Type 2 diabetes.  3. Hypertension.  4. Hypokalemia.   DISCHARGE MEDICATIONS:  1. Albuterol, Atrovent meter dose inhalers.  2. Symbicort 160/4.5 two puffs b.i.d.  3. Lantus 5 units at bedtime.  4. Humulin R 3 units every morning.  5. Avalide 300/12.5 daily.  6. Prilosec 20 mg daily.  7. Cipro 500 mg b.i.d. for five additional days.  8. K-Dur 20 one b.i.d.   HISTORY OF PRESENT ILLNESS:  The patient is a 35 year old black female  with a history of asthma, hypertension who was stable until 1 week prior  admission when she had the onset of crampy lower abdominal pain. For the  past 2 weeks she has noticed increasing fatigue but has continued to  work.  On the day of admission she had abrupt onset of fever, chills  with some intractable nausea and vomiting.  She presented to the  emergency department for evaluation where she is noted to have a  temperature of 103 degrees and severe tachycardia.  Urinalysis revealed  4+ pyuria and she was admitted for further evaluation and treatment of a  UTI complicated by early urosepsis.   LABORATORY DATA:   HOSPITAL COURSE:  The patient was admitted to hospital where after blood  and urine cultures obtained, she was placed on IV Cipro.  She had a  prompt clinical improvement with resolution of her fever. Asthma was  treated with inhalational bronchodilators and also quickly resolved in  the hospital.  Blood sugars were monitored and she was covered with  sliding scale regimen of  NovoLog. She received Lovenox DVT prophylaxis.  Initial laboratory studies included a white count of 7.6, on second  hospital day this increased to 13.8.  Laboratory screen unremarkable  except for potassium low at 2.8 on admission. She received oral on IV  potassium supplementation and prior to discharge potassium was 3.4.  Admission blood sugar before emergency department parenteral steroids  was 139.   DISPOSITION:  The patient was discharged today to complete additional 5  days of antibiotic therapy.  She is scheduled for office follow-up with  her new PCP, Barbette Hair. Artist Pais, DO on November 02, 2006. She will be  discharged on a modified carb diet with instructions to slowly resume  her usual activity.   CONDITION ON DISCHARGE:  Improved.      Gordy Savers, MD  Electronically Signed     PFK/MEDQ  D:  10/19/2006  T:  10/19/2006  Job:  [id number]

## 2011-04-16 NOTE — H&P (Signed)
NAME:  [name] [name], [name] [name] NO.:  [id number]   MEDICAL RECORD NO.:  [id number]          PATIENT TYPE:  INP   LOCATION:  1826                         FACILITY:  [facility]   PHYSICIAN:  Shan Levans, M.D. LHCDATE OF BIRTH:  03/02/1976   DATE OF ADMISSION:  05/04/2006  DATE OF DISCHARGE:                                HISTORY & PHYSICAL   CHIEF COMPLAINT:  Status asthmaticus.   HISTORY OF PRESENT ILLNESS:  This is a 35 year old African/American female  morbidly obese, medically non-adherent.  She returns to the emergency room  after the second visit in two days, with increased shortness of breath and a  cough.  She was just admitted and discharged from March 12, 2006, to March 14, 2006.  At that time she was to return to the practice for outpatient  followup, but she failed to return for a visit.  She has underlying asthma,  morbid obesity, type 2 diabetes, reflux disease, hypertension, medication  non-adherence.  We admitted this patient as an unassigned in April.  Dr.  Charlaine Dalton. Wert was to see her back.  Her primary care doctor is Dr. Thomos Lemons, but unfortunately she has failed to return for the visit.  Part of this  may be financial, because she cannot afford her co-pays.  She is a non-  smoker.  She does have the hypertension and the reflux and morbid obesity.  She is on chronic steroids.   MEDICATIONS:  1.  During the last admission she was to be discharged on prednisone in a      tapering dose.  2.  She was to be on Nexium 40 mg daily.  3.  Avalide 25/300 mg daily.  4.  Actos 45 mg daily.  5.  NovoLog 5 units before each meal.  6.  Xopenex nebulizer q.i.d.  7.  Atrovent nebulizer q.i.d.  8.  A finishing course of Zithromax.   She now has increasing shortness of breath to the point where she is  requiring bi-level support, being admitted to the intensive care unit for  further inpatient care.   PAST MEDICAL HISTORY:  1.  Asthma.  2.  Hypertension.  3.   Diabetes.  4.  Morbid obesity.  5.  Reflux disease.  6.  Medication non-adherence.  Otherwise noncontributory.  No heart disease.   ALLERGIES:  SHELL FISH AND IBUPROFEN.   FAMILY HISTORY:  Noncontributory.   PHYSICAL EXAMINATION:  GENERAL:  This is an obese woman, in acute distress  with excess wheezing.  HEART:  A resting tachycardia.  VITAL SIGNS:  Blood pressure 156/56, pulse 110, sinus tachycardia,  temperature 98 degrees.  CHEST:  Inspiratory and expiratory wheeze with poor air flow.  HEART:  A resting tachycardia without S3.  Normal S1 and S2.  ABDOMEN:  Morbidly obese.  EXTREMITIES:  Nontender.  NEUROLOGIC:  Intact.  HEENT:  No jugular venous distention, no lymphadenopathy.  Oropharynx clear.  NECK:  Supple.   LABORATORY DATA:  On 2 liters pH 7.32, pCO2 of 37, pO2 of 86.  Sodium 135,  potassium 3.7, BUN 11, creatinine 1.  Blood  sugar 326.  Hemoglobin 13.  Cardiac enzymes negative.   Chest x-ray showed no active disease process.   IMPRESSION:  1.  Status asthmaticus.  2.  Medical non-adherence, possibly financial in nature.  3.  Hypertension.  4.  Gastroesophageal reflux disease.  5.  Type 2 diabetes, insulin-dependent.  6.  Morbid obesity.   RECOMMENDATIONS:  Admit to the intensive care unit, bi-level support.  Intensive nebulizer treatments with continuous albuterol, IV Medrol, Lantus  insulin, sliding scale insulin, IV antibiotics.      Shan Levans, M.D. [facility]  Electronically Signed     PW/MEDQ  D:  05/04/2006  T:  05/04/2006  Job:  [id number]   cc:   Charlaine Dalton. Sherene Sires, M.D. [facility]  [address]  [place]  [place] [address]

## 2011-04-16 NOTE — Assessment & Plan Note (Signed)
[facility]                           PRIMARY CARE OFFICE NOTE   KAETLIN, BULLEN                     MRN:          [id number]  DATE:12/30/2006                            DOB:          07/14/1976    CHIEF COMPLAINT:  New patient to practice.   HISTORY OF PRESENT ILLNESS:  Patient is a 35 year old African-American  female here to establish primary care.  She has seen Dr. Sherene Sires in the  past secondary to hospitalization for severe asthma exacerbation.  She  is noted to have poor compliance and was very close to mechanical  ventilation in August, 2007.  Since that time, patient states that she  has been fairly compliant with her maintenance inhalers.  She is  currently on Symbicort 160/4.5 2 puffs b.i.d.  She has not needed to  take any rescue inhalers.   She also has a history of type 2 diabetes for at least the last five  years.  She was previously on oral agents but was switched to insulin  during one of her hospitalizations.  The patient states that she only  takes 3 units of Lantus and 3 units of regular in the a.m.  She did not  have any followup regarding insulin use.  She had not established with a  primary care physician or endocrinologist in [place].   Review of her chart notes that her last A1C was performed in 2006 and  was noted to be 8.2.   Patient admits to a very poor diet.  She is fairly liberal with her  carbohydrate intake and drinks sugar beverages.  Currently, her blood  sugars are uncontrolled at 200-300 in the morning.   She describe any history of heart disease.  Her cholesterol status is  unknown.   PAST MEDICAL HISTORY:  1. History of severe asthma.  2. Type 2 diabetes x5 years.  3. Hypertension, poorly controlled.  4. History of noncompliance.  5. History of depression.  6. Morbid obesity.   CURRENT MEDICATIONS:  1. Zegerid 40/1100 1 daily.  2. Symbicort 160/4.5 2 puffs b.i.d.  3. Avalide 300 mg/12.5 mg 1  daily.   ALLERGIES TO MEDICATIONS:  MOTRIN, ASPIRIN, which exacerbate her asthma.   SOCIAL HISTORY:  The patient is single.  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  She was previously employed  as a Corporate treasurer.  She has three children, ages 51, 56, and 5  that live with her.   FAMILY HISTORY:  Mother is alive at age 53.  Has type I diabetes.  Father is age 19, has obesity and obstructive sleep apnea.  No report of  cancer in the family.   HABITS:  No alcohol.  No tobacco.   REVIEW OF SYSTEMS:  No fevers or chills.  No HEENT symptoms.  Patient  denies any chest pain.  Asthma is fairly well controlled.  Still has  heartburn occasionally on her current regimen.  Occasional polyuria,  polydipsia, all negative.   PHYSICAL EXAMINATION:  VITAL SIGNS:  Height is 5 feet 6.  Weight is 294  pounds.  Temperature is 97.2, pulse 97, BP 171/115 in the left arm in a  seated position.  She states that she has not taken her medications in  approximately 2-3 days.  GENERAL:  Patient is a pleasant but morbidly obese 36 year old African-  American female who appears somewhat older than stated age.  HEENT:  Normocephalic, atraumatic.  Pupils are equal and reactive to  light bilaterally.  Extraocular motility was intact.  Patient was  anicteric.  Conjunctivae were within normal limits.  External auditory  canals and tympanic membranes are clear bilaterally.  Oropharyngeal exam  revealed a crowded upper, otherwise unremarkable.  NECK:  Thick.  Positive acanthosis nigricans.  No thyromegaly.  LUNGS:  Normal respiratory effort.  Chest is clear to auscultation  bilaterally.  No rales, rhonchi or wheezing.  CARDIOVASCULAR:  Regular rate and rhythm.  No significant murmurs, rubs  or gallops appreciated.  ABDOMEN:  Protuberant.  Nontender.  Positive bowel sounds.  No  organomegaly.  MUSCULOSKELETAL:  No clubbing, cyanosis or edema.  SKIN:  Warm and dry.   NEUROLOGIC:  Cranial nerves II-XII are grossly intact.  She is nonfocal.   IMPRESSION:  1. Hypertension, uncontrolled.  2. Type 2 diabetes, uncontrolled.  3. Asthma, stable.  4. Gastroesophageal reflux disease.  5. History of depression.  6. Health maintenance.   RECOMMENDATIONS:  Patient has not complied with Avalide.  She complains  of frequent urination with HCTZ portion.  We will switch her today to  Azor.  She was given samples of 20/5 mg.  Follow-up time is in  approximately four weeks.  Within the next one week time period, we will  obtain an A1C, microalbumin/creatinine ratio and basic metabolic  profile, and her lipid status.  We discussed natural history of type 2  diabetes and need for compliance from a dietary standpoint and exercise  program.  She seems to understand.   If kidney function is normal, we will restart some of her oral agents,  including Metformin.  She may be a candidate for Byetta.  We will refer  her to a diabetic educator for further counseling.   Follow up within approximately 2-3 weeks.     Barbette Hair. Artist Pais, DO  Electronically Signed    RDY/MedQ  DD: 12/30/2006  DT: 12/30/2006  Job #: [id number]

## 2011-04-16 NOTE — H&P (Signed)
NAME:  [name] [name], VILLAMAR NO.:  [id number]   MEDICAL RECORD NO.:  [id number]          PATIENT TYPE:  INP   LOCATION:  4731                         FACILITY:  [facility]   PHYSICIAN:  Charlaine Dalton. Sherene Sires, M.D. [facility] OF BIRTH:  [birth year]   DATE OF ADMISSION:  06/28/2006  DATE OF DISCHARGE:                                HISTORY & PHYSICAL   CHIEF COMPLAINT:  Respiratory distress.   HISTORY:  [age]-year-old black female, never-smoker, with morbid  obesity complicated by diabetes and GERD and who carries a diagnosis of  chronic asthma, but has not been back to the pulmonary clinic since her most  recent hospitalization for similar attack, May 04, 2006, when she was  started on Qvar and Foradil.  She states at home her regimen is to use  Atrovent and Xopenex by nebulizer 4 times a day and use Flovent (when she  needs it).  The chart indicates that she cannot pay for her medications and  that is apparently why she has not been back to the office since her  discharge.  She was in her usual state of health until 3 days ago with  increasing symptoms of coughing and reflex with mucoid sputum and sensation  of postnasal drainage.  She denies any headache or sore throat.  No sputum,  chest pain or abdominal pain, or leg swelling.  She came to the emergency  room yesterday and was discharged home after nebulizer treatments and  returns early this morning in respiratory distress and is therefore  hospitalized with a tentative diagnosis of status asthmaticus.   PAST MEDICAL HISTORY:  1.  Asthma.  2.  Hypertension.  3.  Diabetes.  4.  Morbid obesity.  5.  Reflux.  6.  Documented nonadherence.   ALLERGIES:  SHELLFISH and IBUPROFEN, which cause her to swell up.   SOCIAL HISTORY:  She has never smoked.   FAMILY HISTORY:  Negative for respiratory diseases.   REVIEW OF SYSTEMS:  Review of systems is taken in detail and negative,  except as outlined above.   PHYSICAL  EXAMINATION:  GENERAL:  This is an obese black female in no acute  distress, lying on her left side.  VITAL SIGNS:  Blood pressure 165/101 on admission with a pulse rate of 134  that has actually dropped with treatment.  HEENT:  Unremarkable.  Pharynx is clear.  NECK:  Supple without cervical adenopathy or tenderness.  LUNGS:  Lung fields reveal panexpiratory distant wheezes without adequate  air movement; however, there is marked increase in expiratory time.  CARDIAC:  Regular rate and rhythm without murmur, gallop or rub.  No  increase in P2.  ABDOMEN:  Obese, otherwise benign.  EXTREMITIES:  Warm without calf tenderness, cyanosis, clubbing or edema.  NEUROLOGIC:  No focal deficits or pathologic reflexes.  Her sensorium is  normal.  SKIN:  Warm and dry.   RADIOLOGIC FINDINGS:  Chest x-ray shows no infiltrates or effusions.   LABORATORY DATA:  Significant for a potassium of 3.0, otherwise normal,  except for a blood sugar of 181.   IMPRESSION:  1.  Refractory asthma/status  asthmaticus in a patient with documented      nonadherence, very high risk of asthma morbidity and mortality and will      need to be admitted at this point for intravenous nebulizers,      intravenous steroids, empiric high doses of Protonix and Reglan.  Prior      to discharge, help from the family will be needed in terms of trying to      improve this patient's compliance in preventing her from dying from      asthma, including making sure that she keeps outpatient appointments and      has a source for her medications.  We will need to get Case Management's      help in this regard.  2.  Morbid obesity complicated by both diabetes, hypertension and      gastroesophageal reflux disease.  See orders.           ______________________________  Charlaine Dalton Sherene Sires, M.D. [facility]     MBW/MEDQ  D:  06/28/2006  T:  06/28/2006  Job:  [id number]

## 2011-04-16 NOTE — H&P (Signed)
NAME:  [name] [name], [name] [name] NO.:  [id number]   MEDICAL RECORD NO.:  [id number]          PATIENT TYPE:  EMS   LOCATION:  ED                           FACILITY:  [facility]   PHYSICIAN:  Hollice Espy, M.D.DATE OF BIRTH:  [birth year]   DATE OF ADMISSION:  08/07/2005  DATE OF DISCHARGE:                                HISTORY & PHYSICAL   CHIEF COMPLAINT:  Shortness of breath.   HISTORY OF PRESENT ILLNESS:  The patient is a 35 year old African-American  female with past medical history of four years of severe asthma with  frequent exacerbation as well as obesity, hypertension, and diabetes  mellitus who recently moved here from [place], [place]. She tells  me that she does not have a primary care physician or lung doctor yet  established here in [place]. She says the last few days she has been  having problems with worsening shortness of breath, exertion, and cough  which initially has been mostly yellowish but once was greenish sputum. She  does not complain of any subjective fevers but could tell her asthma is  starting to flare up. She is on extensive outpatient dose and knew when her  shortness of breath was persistent despite this that her asthma was flaring  up again.   She came into the emergency room and was found to be audibly wheezing. On  the time of admission, knows her heart rate was 139. She was saturating 93%  on two liters of oxygen and her blood pressure was elevated at 156/101. The  patient was given one dose of IV Solu-Medrol and she says her breathing is a  little bit better. She denies any headaches, vision changes, dysphagia,  chest pain, although she does complain of a tightness whenever her asthma is  especially flared up. She complains of shortness of breath, wheezing,  productive cough, mostly clear right now but was greenish and yellowish  earlier. No abdominal pain. No hematuria, dysuria, constipation, diarrhea.  She denies any  focal extremity numbness, weakness, or pain, although she  says overall she feels incredibly fatigued secondary to her asthma. Her  review of systems is otherwise negative.   PAST MEDICAL HISTORY:  1.  Asthma with frequent exacerbation requiring hospitalization but never      any intubation.  2.  Hypertension.  3.  Obesity.  4.  Diabetes.   MEDICATIONS:  She is on chronic steroids. She is on prednisone 10, Singulair  10, Atrovent and Xopenex nebulizers, Avalide, Actos, Protonix.   ALLERGIES:  The patient has allergy to MOTRIN.   SOCIAL HISTORY:  She denies any tobacco, alcohol, or drug use.   FAMILY HISTORY:  Negative for any lung-related diseases including asthma.   PHYSICAL EXAMINATION:  VITAL SIGNS:  Temperature 97.3, heart rate 139 (now  down to 98), blood pressure 156/101, respirations 36 (now down to 24). O2  saturation 97% on two liters.  GENERAL:  The patient is alert and oriented x3 in some mild respiratory  distress but better.  HEENT:  Normocephalic and atraumatic. Mucus membranes are slightly dry. She  has no carotid bruits. She  has a narrow airway.  HEART:  Regular rate and rhythm. S1 and S2.  LUNGS:  She has diffuse expiratory wheezing throughout with a moderate to  poor airway exchange and inspiratory effort.  ABDOMEN:  Soft, nontender, and obese. Positive bowel sounds.  EXTREMITIES:  No clubbing, cyanosis, or pitting edema.   LABORATORY DATA:  A pH 7.446, pCO2 41, pO2 162, bicarbonate 28. O2  saturation 99%. This is on two liters. Sodium 143, potassium 3.3, chloride  101, bicarbonate 27, BUN 5, creatinine 0.7, glucose 187, calcium 8.2. She  has normal white count of 5.2, no shift. H&H 11.2 and 33.9. MCV of 79.  Platelet count of 278,000.   ASSESSMENT/PLAN:  1.  Severe asthma exacerbation:  The patient appears to have a long,      extensive history in that first she has tried to be aggressively      controlled. She is on multiple inhalers, nebulizers,  steroids, but      despite this is having another exacerbation. We will go with a high dose      of intravenous steroids around the clock. I neglected to mention the      patient is also on an Advair inhaler. We will also go with continued      nebulizers, oxygen treatments, and go with antibiotic even though she      has no white count or shift with her productive green cough and previous      history. I would like to aggressively treat to any type of respiratory      condition we may find.  2.  Microcytic anemia:  The patient may have a history of anemia from      chronic disease from her asthma, iron-deficiency anemia or she may have      an inherited sickle cell trait with thalassemia. However, we will check      iron studies as noting that if she does have an iron-deficiency anemia      perhaps helping to correct this could improve her anemia and      oxygenation.  3.  Gastroesophageal reflux disease:  Continue Protonix.  4.  Hypertension:  Continue her Avalide.  5.  Diabetes mellitus:  Put her on an aggressive sliding scale, especially      with her being on intravenous steroids.  6.  Morbid obesity:  Once the patient is feeling better, breathing      comfortably without oxygen or heavy steroid support, can plan to      discharge her to home. Prior to doing this, we will establish her with a      primary care physician and a lung physician.      Hollice Espy, M.D.  Electronically Signed     SKK/MEDQ  D:  08/07/2005  T:  08/07/2005  Job:  [id number]

## 2011-04-16 NOTE — Discharge Summary (Signed)
NAME:  [name] [name], JUNKINS NO.:  [id number]   MEDICAL RECORD NO.:  [id number]          PATIENT TYPE:  INP   LOCATION:  1605                         FACILITY:  [facility]   PHYSICIAN:  Danae Chen, M.D.DATE OF BIRTH:  May 30, 1976   DATE OF ADMISSION:  12/17/2005  DATE OF DISCHARGE:  12/19/2005                                 DISCHARGE SUMMARY   Patient is unassigned at the present.  She is presently switching her care  from her primary doctor in [place], [place] to a local physician.   DISCHARGE DIAGNOSES:  1.  Acute exacerbation of asthma.  2.  Mild bronchitis.  3.  History of diabetes.  4.  History of hypertension.  5.  History of obesity.  6.  Reflux disease.   DISCHARGE MEDICATIONS:  Patient is to resume her home meds at prior doses.  This includes albuterol/Atrovent measured dose inhalers, Actos 45 mg p.o.  daily, Advair discus 50/250 1 puff b.i.d., Singulair 10 mg p.o. daily,  Avalide 200/12.5 mg 1 p.o. daily, Prilosec 20 mg p.o. daily, Mylanta as  needed.   Patient has a scheduled appointment to see Dr. Sung Amabile as an outpatient but  has not yet been able to see him.   BRIEF HOSPITAL COURSE:  Patient is a pleasant 35 year old African-American  female who has recently moved to the [place] area, who presented with a  two day history of increasing shortness of breath, cough, and wheezing.  She  tried using her inhalers at home but continued to have dyspnea, which  brought her to the hospital.  She denied any nausea, vomiting, fevers or  chills.  She was treated empirically for exacerbation of her asthma with  high-dose prednisone and continuation of her home meds.  She had no white  count and no fever.  No evidence of productive sputum.  She was not placed  on antibiotics.  The patient did well during her hospital course.  At the  time of discharge, she was breathing easy with no cough and no shortness of  breath.   PHYSICAL EXAMINATION:   VITAL SIGNS:  On exam, she is afebrile.  Vital signs  are stable.  O2 sat was 97% on room air.  LUNG:  Lung fields are clear to auscultation bilaterally with no wheezing.  HEART:  Heart rate was distant but regular with normal S1 and S2.  ABDOMEN:  Obese.  EXTREMITIES:  She had no peripheral edema.  NEUROLOGIC:  She is alert and oriented.   PERTINENT LABS:  Cardiac markers were negative.  BUN and creatinine 9 and  0.7.  Hemoglobin 9.9.  Hemoglobin A1C 7.4.  TSH of 0.224.  Lipid profile:  Cholesterol 146, HDL 48, and LDL of 86.  Potassium was 3.7.   On followup, the patient is to follow up with her low TSH level and her  anemia.  She is aware of this.  Patient will be placed on a prednisone taper  over the next seven days as well.      Danae Chen, M.D.  Electronically Signed     RLK/MEDQ  D:  12/19/2005  T:  12/20/2005  Job:  [id number]

## 2011-04-16 NOTE — Discharge Summary (Signed)
NAME:  LEEASIA, SECRIST NO.:  [id number]   MEDICAL RECORD NO.:  [id number]          PATIENT TYPE:  INP   LOCATION:  1424                         [facility]ACILITY[facility]   PHYSICIAN:  Theone Stanley, MD   DATE OF BIRTH:  Apr 01, 1976   DATE OF ADMISSION:  11/12/2005  DATE OF DISCHARGE:  11/14/2005                                 DISCHARGE SUMMARY   ADMISSION DIAGNOSES:  1.  Asthma exacerbation.  2.  Diabetes.  3.  Chronic prednisone use.  4.  Hypertension.  5.  Obesity.   DISCHARGE DIAGNOSES:  1.  Asthma exacerbation.  2.  Diabetes.  3.  Chronic prednisone use.  4.  Hypertension.  5.  Obesity.   CONSULTATIONS:  None.   PROCEDURES/DIAGNOSTIC TESTS:  None.   HOSPITAL COURSE:  Ms. Reno is a 35 year old African-American female with  known asthma, presenting with asthmatic exacerbation.  On her admission, she  was placed on IV Solu-Medrol.  Over the next day, she was doing much better.  In addition, it was noted that she did have peripheral edema.  She was given  Lasix.  She diuresed well.  She was then switched to 60 mg IV of Solu-  Medrol.  On the 17th, the patient was at her baseline, and indicated she  wanted to go home.  During her stay, she was continued on her home  medication for her diabetes and insulin sliding scale.  The patient left in  stable condition.   DISCHARGE MEDICATIONS:  1.  Albuterol MDI.  2.  Atrovent nebs.  3.  Albuterol nebs.  4.  Actos 45 mg 1 p.o. daily.  5.  Advair inhaled b.i.d.  6.  Singulair 10 mg 1 p.o. q.h.s.  7.  Avapro 300 mg 1 p.o. daily.  8.  Prilosec 20 mg b.i.d.  9.  Avelox 400 mg 1 p.o. daily.  10. Prednisone taper 60 mg 1 p.o. daily x2 days, then 40 mg 1 p.o. daily x3      days, then 30 mg 1 p.o. daily x3 days, 20 mg 1 p.o. x3 days, then 10 mg      1 p.o. daily until she is seen by Dr. Sung Amabile.  11. NovoLog insulin sliding scale 121-150 3 units; 151-200 6 units; 201-250      9 units; 251-300 12 units; 301-350 16  units; 351-400 20 units; greater      than 400, call MD.   FOLLOW UP:  Patient is to follow up with Dr. Sung Amabile in two weeks time and  with her primary care in 3-4 weeks.      Theone Stanley, MD  Electronically Signed     AEJ/MEDQ  D:  11/14/2005  T:  11/15/2005  Job:  [id number]   cc:   Oley Balm. Sung Amabile, M.D. [facility]  [address]  [place]  [place] [address]   Thomos Lemons, D.O. [facility]  [address] [place], [place] [address]

## 2011-04-16 NOTE — Discharge Summary (Signed)
NAME:  [name] [name], [name] [name] NO.:  [id number]   MEDICAL RECORD NO.:  [id number]          PATIENT TYPE:  INP   LOCATION:  5707                         FACILITY:  [facility]   PHYSICIAN:  Charlaine Dalton. Sherene Sires, M.D. [facility] OF BIRTH:  08-06-1976   DATE OF ADMISSION:  03/12/2006  DATE OF DISCHARGE:  03/14/2006                                 DISCHARGE SUMMARY   DISCHARGE DIAGNOSES:  1.  Status asthmaticus, resolved.  2.  Asthma.  3.  Morbid obesity.  4.  Diabetes type 2.  5.  Gastroesophageal reflux disease.  6.  Hypertension.  7.  Medication noncompliance.   LABORATORY DATA:  1.  On March 14, 2006, white blood cells 14.5, hemoglobin 9.6, hematocrit      29.6, platelets 279.  2.  On March 12, 2006, hemoglobin A1C 8.4.  3.  On March 12, 2006, sodium 132, potassium 4.1, chloride 105, CO2 23,      glucose 472, BUN 11, creatinine 1.1.   BRIEF HISTORY:  A 35 year old African-American female with chronic asthma,  for which she is followed by Dr. Sherene Sires.  She is seen on the day of admission  by Dr. Maple Hudson by request of Dr. Oletta Lamas, the EDP.  She had felt that she had  caught cold.  Presented to the emergency room for treatment on March 11, 2006.  Received routine management with nebulizers and Solu-Medrol and  discharged with a prescription of taper of prednisone.  She filled the taper  but did not take it because she was afraid that she might choke on the pills  while she was still wheezing.  Instead, she returned to the emergency room  on March 12, 2006, having used her own nebulizers, received nebulizer  treatments in the ambulance, and treated again in the emergency room with  only partial improvement.  She initially declined admission, stating she had  small children at home and was unable to get child care.   PAST MEDICAL HISTORY:  1.  Chronic asthma, never requiring intubation.  2.  Hypertension.  3.  Diabetes, insulin-dependent (denies heart disease).   HOSPITAL COURSE BY  DISCHARGE DIAGNOSES:  1.  Status asthmaticus (resolved), asthma:  [name] [name] was admitted by Dr.      Fannie Knee and treated in the usual fashion with IV systemic steroids,      inhaled bronchodilators, and empiric antimicrobial therapy.  She      completed two days of IV azithromycin.  She will be discharged to home      on three more days of azithromycin.  She additionally received two days      of IV Rocephin.  Additionally, she received IV systemic steroids with      Solu-Medrol x2 days.  Her respiratory status has continued to improve      over the course of her hospitalization, now requiring no rescue      nebulizers and ready to be weaned to oral prednisone.  Her respiratory      status has continued to improve to baseline.  She states that she is      currently  ready for discharge.  2.  Hypertension:  This is managed with oral Avalide.  3.  Diabetes type 2 with hyperglycemia:  This was certainly exacerbated by      high dose steroids.  She will be sent home on her regular 5 units prior      to meals of NovoLog insulin.  Her hemoglobin A1C was noted above.  She      will no doubt need titration of her hyperglycemic regimen in the      outpatient setting but given we are actively adjusting her steroid      dosing, would favor decreasing the steroids first and then re-evaluating      her hyperglycemia regimen.  4.  Gastroesophageal reflux disease:  Patient did have a sample of Zegerid      in the outpatient setting.  That had run out.  This is no doubt a high      contributing factor to her wheezing difficulties and asthmatic      exacerbations.  She will be sent home with a prescription for Nexium.  5.  Morbid obesity:  No doubt complicating this and exacerbated by frequent      doses of steroids.  She will require extensive counseling in the      outpatient setting.   DISCHARGE INSTRUCTIONS:  1.  Diet as tolerated.  2.  Activity as tolerated.  3.  Medications:  Prednisone taper  10 mg tabs, 4 tabs x3 days, then 3 tabs      x3 days, then 2 tabs x3 days, then 1 tab daily until seen by Dr. Sherene Sires.      Nexium 40 mg 1 tab daily.  Avalide 25/300 1 tab daily.  NovoLog insulin      5 units subcutaneously before meals.  Actos 45 mg 1 tab daily.      Xopenex/Atrovent nebulizers 4 times a day.  Azithromycin 250 mg tab 1      tab daily x3 more days.  4.  Follow up with Rubye Oaks, nurse practitioner, for extensive      medication followup and counseling.  I reviewed to [name] [name] the      importance of bringing every one of her medications to this visit and      how important it was for her to have a good understanding of her      medication regimen and what medications she should be taking.  She again      was counseled extensively on her discharge regimen.  Additionally, she      has followup with Dr. Artist Pais on Apr 04, 2006.   PHYSICAL EXAMINATION AT DISCHARGE:  VITAL SIGNS:  Afebrile.  Heart rate in  the 90s.  Respirations 20.  Blood pressure 196/99 x1 isolated reading, and  then typically running in the 144/high 80s to low 90s.  Saturations 95% on  room air.  HEENT/NECK:  The neck is large but no clear JVD.  LUNGS:  Breath sounds with faint expiratory wheezes.  HEART:  Regular rate and rhythm.  EXTREMITIES:  Without edema.  Pulses 2+.  Extremities are warm to touch.   DISPOSITION:  Ready for discharge.  Instructions were reviewed in detail.  Patient states that she understands her discharge instructions.  Prescriptions were provided for prednisone, Nexium, Avalide, NovoLog  insulin, and azithromycin.  No refills were provided at this time.  As a  side note, in regards to the patient's noncompliance, I do have some  concerns  as whether or not medical compliance will continue to be an issue.  When I was trying to make followup for [name] [name], she did not seem to understand the importance of medical followup and how important it  was to be seen.  She states the $30 co-pay  at the end of the month was very  difficult for her to afford.  I informed her that deciding upon an  appropriate medical regimen and medication counseling was paramount to her  well-being, and she has since agreed to keep her medical appointment.      Anders Simmonds, N.P. [facility]    ______________________________  Charlaine Dalton. Sherene Sires, M.D. [facility]    PB/MEDQ  D:  03/14/2006  T:  03/14/2006  Job:  [id number]   cc:   Thomos Lemons, D.O. [facility]  [address] [place], [place] [address]   Rubye Oaks, NP [facility]  [address]  [place], [place] [address]

## 2011-04-16 NOTE — Discharge Summary (Signed)
NAME:  [name] [name], [name] [name] NO.:  [id number]   MEDICAL RECORD NO.:  [id number]          PATIENT TYPE:  INP   LOCATION:  5709                         FACILITY:  [facility]   PHYSICIAN:  Oley Balm. Sung Amabile, M.D. [facility] OF BIRTH:  02-Feb-1976   DATE OF ADMISSION:  10/12/2005  DATE OF DISCHARGE:  10/13/2005                                 DISCHARGE SUMMARY   ADMISSION DIAGNOSES:  1.  Asthma exacerbation.  2.  Obesity.  3.  Type 2 diabetes.  4.  Gastroesophageal reflux disease.   DISCHARGE DIAGNOSES:  1.  Asthma exacerbation.  2.  Obesity.  3.  Type 2 diabetes.  4.  Gastroesophageal reflux disease.  5.  Chronic sinusitis.  6.  Hypokalemia.   HISTORY OF PRESENT ILLNESS:  Please refer to admission and physical for this  patient's initial presentation. Briefly, she is a 35 year old African-  American woman with a 4-year history of difficult to control asthma. She has  been prednisone dependent and chronically on 20 milligrams per day. She  claims that she is indeed medically compliant. Other maintenance medications  include Advair, Singulair and nebulized Xopenex and ipratropium. She is also  on Protonix for gastroesophageal reflux disease but note that she was unable  to obtain this medication in the weeks leading up to hospitalization. She  presented with respiratory distress and wheezing. She was modestly improved  after nebulized bronchodilators in the emergency department but still  required admission for persistence of bronchospasm. Her past history is also  notable for type 2 diabetes and morbid obesity which has been markedly  exacerbated due to the chronic steroids.   On physical examination, she was noted to have scattered wheezes,  significant obesity and abdominal striae. Her chest x-ray was normal. Labs  were notable only for a potassium of 3.1 which was presumed to be due to the  numerous nebulized bronchodilators received in the emergency department.   HOSPITAL COURSE:  She was admitted to a regular bed and treated in the usual  fashion with systemic steroids, empiric antibiotics, insulin coverage for  diabetes. A CT scan of the sinuses was performed which did indeed show  chronic pansinusitis. On the day following admission, she was markedly  improved and desired discharge to home. There were no wheezes on the final  examination in the hospital. She was in no distress.   DISCHARGE MEDICATIONS:  1.  Advair 250/50 1 inhalation b.i.d.  2.  Singulair 10 milligrams p.o. q.h.s.  3.  Prednisone taper-60 milligrams x3, 40 milligrams x3, 20 milligrams      thereafter until follow-up.  4.  Nebulized Xopenex plus Atrovent as needed up to every 4 hours.  5.  Zyrtec D 24-hour 1 p.o. q.a.m.  6.  Augmentin 875 milligrams b.i.d. x14 days for chronic sinusitis.  7.  Prilosec 20 milligrams b.i.d.  8.  Actos 45 milligrams q.a.m.   Please note that new prescriptions were written for the Augmentin, Zyrtec  and Prilosec.   DISCHARGE INSTRUCTIONS:  We discussed the need for dietary compliance for  weight loss and diabetic management.   FOLLOW-UP APPOINTMENTS:  1.  I arranged primary care follow-up with Dr. Artist Pais of [facility],      specifically for the management of diabetes. Her follow-up appointment      is October 18, 2005 at 2:30 p.m.  2.  She will follow-up with my nurse practitioner, Rubye Oaks, on      Wednesday, October 27, 2005 at 10 a.m.   Long-term plans is my intention and my hope that we can taper her down or  off  of prednisone. Hopefully, treatment and management of the chronic  sinusitis will allow this to happen. If she remains clinically stable  without wheezing upon follow-up on October 27, 2005, I would like her  prednisone tapered to 10 milligrams. She will ultimately need follow-up with  me in approximately 4-6 weeks after discharge.           ______________________________  Oley Balm. Sung Amabile, M.D. [facility]      DBS/MEDQ  D:  10/13/2005  T:  10/13/2005  Job:  [id number]   cc:   Thomos Lemons, D.O. [facility]  [address] [place], [place] [address]

## 2011-04-16 NOTE — H&P (Signed)
NAME:  [name] [name], [name] [name] NO.:  [id number]   MEDICAL RECORD NO.:  [id number]          PATIENT TYPE:  INP   LOCATION:  0106                         FACILITY:  [facility]   PHYSICIAN:  Gordy Savers, MDDATE OF BIRTH:  [birth year]   DATE OF ADMISSION:  10/17/2006  DATE OF DISCHARGE:                              HISTORY & PHYSICAL   CHIEF COMPLAINT:  Weakness.   PRESENT ILLNESS:  The patient is a 35 year old black female with a  history of severe asthma and hypertension.  For the past week she has  noted some occasional lower abdominal crampy pain.  She has also noted  perhaps some mild right-sided CVA discomfort.  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  On the day of admission she had the abrupt onset of  fever, chills and intractable nausea and vomiting.  She presented to the  emergency department via ambulance for evaluation, where she was noted  to have a fever of 103 degrees and extremely tachycardic.  Urinalysis  revealed pyuria, and the patient is now admitted for further evaluation  and treatment of acute urinary tract infection and possible  pyelonephritis.   PAST MEDICAL HISTORY:  She was last hospitalized here last month.  She  states that she is either hospitalized or requires an ER admission once  per month due to asthma.  She is followed closely by Dr. Sherene Sires.  She has  a history of steroid-associated diabetes, hypertension and morbid  obesity.  Additionally, she has gastroesophageal reflux disease and a  history of microcytic anemia.  She is a gravida 5, para 3, abortus 2.  She has had a tubal ligation in the past.   ALLERGIES:  IBUPROFEN and ASPIRIN PRODUCTS.   She is a nonsmoker, nondrinker, divorced.  She has two sons and a  daughter.  She is on disability but presently going to school in an  attempt to obtain a BS in criminal justice.   FAMILY HISTORY:  Both parents are  58 years of age.  Mother with history  of diabetes.  One sister has required open heart surgery for valvular  congenital heart disease.   PRESENT MEDICAL REGIMEN:  1. Lantus is 5 units h.s.  2. Humulin R 3 units every morning.  3. Avalide/30012.5 mg daily.  4. Symbicort 160/4.5 mcg two puffs b.i.d.  5. She has completed her prednisone taper 3 days ago.  6. Albuterol.  7. Atrovent.  8. Prilosec.   PHYSICAL EXAMINATION:  GENERAL:  A well-developed black female, markedly  overweight, in no acute distress at rest.  VITAL SIGNS:  Blood pressure is 130/80, pulse rate 120.  SKIN:  Warm and dry without rash.  NECK:  Normal pupil responses.  Conjunctivae clear.  Oropharynx benign.  NECK:  No neck vein distension.  No adenopathy.  CHEST:  Prolonged expiratory phase with some audible wheezing.  CARDIOVASCULAR:  Resting tachycardia without murmur.  ABDOMEN:  Obese, soft and nontender.  There is no flank tenderness.  Bowel sounds were present.  EXTREMITIES:  No  edema.  Peripheral pulses were intact.   IMPRESSION:  1. Urinary tract infection with early urosepsis, possible      pyelonephritis.  2. Asthma.  3. Hypertension.  4. Morbid obesity.  5. Gastroesophageal reflux disease.  6. Type 2 diabetes.   DISPOSITION:  The patient will be admitted to the hospital.  She will be  supported with IV fluids.  Will also receive potassium supplementation  in view of her hypokalemia.  She will be maintained on IV Cipro.  Blood  sugars will be monitored and orders for sliding scale insulin coverage  instituted.  Lovenox DVT prophylaxis will also be ordered.      Gordy Savers, MD  Electronically Signed     PFK/MEDQ  D:  10/17/2006  T:  10/18/2006  Job:  [id number]

## 2011-04-16 NOTE — H&P (Signed)
NAME:  [name] [name], [name] [name] NO.:  [id number]   MEDICAL RECORD NO.:  [id number]          PATIENT TYPE:  EMS   LOCATION:  ED                           FACILITY:  [facility]   PHYSICIAN:  Isidor Holts, M.D.  DATE OF BIRTH:  Apr 08, 1976   DATE OF ADMISSION:  12/17/2005  DATE OF DISCHARGE:                                HISTORY & PHYSICAL   PMD:  Dr. Renold Don at [facility], [place], [place].  Telephone  number [phone].   Patient is unassigned to [place].   NOTE:  The patient is scheduled to see Dr. Billy Fischer as an outpatient  but has not had her first appointment yet.   CHIEF COMPLAINT:  Shortness of breath, cough, wheeze.   HISTORY OF PRESENT ILLNESS:  This is a 35 year old female.  For past medical  history, see below.  She is a known asthmatic and has been wheezing for  approximately 2-3 days.  Complains of cough productive of thick, clear  phlegm but no fever.  Has been utilizing her home nebulizers, but on December 16, 2005, she went to the emergency department at [facility]  because of worsening symptoms was given steroid treatment, and subsequently  discharged.  Her symptoms have progressed.  On waking up in the a.m. of  December 17, 2005, she felt short of breath with chest tightness and called  EMS.   PAST MEDICAL HISTORY:  1.  Bronchial asthma.  2.  Diabetes mellitus.  3.  Hypertension, diagnosed approximately three years ago.  4.  GERD.  5.  Obesity.  6.  History of chronic steroid treatment.   MEDICATIONS:  1.  Albuterol MDI.  2.  Albuterol/Atrovent nebulizers.  3.  Actos 45 mg p.o. daily.  4.  Advair Diskus 1 puff b.i.d.  5.  Singulair 10 mg p.o. q.h.s.  6.  Avalide 300/12.5 1 p.o. daily.  7.  Mylanta gel tabs 1 p.o. b.i.d.  8.  Prednisone 10 mg p.o. daily, after completing a tapering course, but she      ran out approximately 2-3 weeks ago.   ALLERGIES:  1.  IBUPROFEN.  This causes angioedema.  2.  SHELLFISH.   REVIEW OF SYSTEMS:  As per HPI and chief complaint, otherwise negative.   SOCIAL HISTORY:  Patient is single.  She is currently on disability, but is  a full-time Investment banker, operational.  Has three offspring.  Two of them  are alive and well. Her 35-year old is status post adenoidectomy.  Patient is  a nonsmoker and nondrinker.  Has no history of drug abuse.  She has a sister  who has had heart valve surgery.  The patient's mother has diabetes  mellitus.  Her father is alive and well, although the patient's father has  several family members with heart disease.   PHYSICAL EXAMINATION:  VITAL SIGNS:  Temperature 96.7, pulse 102 per minute  and regular, respiratory rate 20, BP 161/108 mmHg, pulse oximetry 97% on 15  liters of oxygen initially, then following bronchodilator nebulizers, 97% on  2 liters of oxygen.  GENERAL:  Patient is alert and  oriented.  Communicative.  Not in obvious  acute distress, after nebulizer treatment in the emergency department.  HEENT:  No clinical pallor.  No jaundice.  No conjunctival injection.  Throat is quite clear.  NECK:  Supple.  JVD not seen.  No palpable lymphadenopathy.  No palpable  goiter.  LUNGS:  Bilateral expiratory wheezes.  No crackles.  HEART:  S1 and S2 heard.  Normal, regular.  No murmurs.  ABDOMEN:  Obese, cushingoid.  Otherwise, soft and nontender.  The patient  does have mild epigastric discomfort.  There is no palpable organomegaly.  No palpable masses.  Normal bowel sounds.  EXTREMITIES:  No pitting edema.  Palpable peripheral pulses.  MUSCULOSKELETAL:  Quite unremarkable.  CENTRAL NERVOUS SYSTEM:  No focal neurologic deficits on cross examination.   INVESTIGATIONS:  CBC:  WBC 8.1, hemoglobin 10.5, hematocrit 32.3, platelets  322.  Electrolytes:  Sodium 138, potassium 3.4, chloride 109, CO2 21, BUN 5,  creatinine 0.9, glucose 312.  LFTs are normal.  D-dimer is normal at 0.38.   Chest x-ray dated December 17, 2005, shows slight  peribronchial thickening.  No active disease.   ASSESSMENT/PLAN:  1.  Exacerbation of bronchial asthma:  This does not appear to be infective,      i.e., phlegm is clear.  We shall manage with steroids, bronchodilator      nebulizers, oxygen supplementation.  Meanwhile, continue Singulair. We      shall maintain low threshold for adding antibiotics if phlegm turns      yellow or green.   1.  Hypertension:  This appears uncontrolled.  Continue Avalide in pre-      admission dosage.  We, however, have added Norvasc 5 mg p.o. daily to      treatment.   1.  Diabetes mellitus:  This appears uncontrolled, and is likely to be      further exacerbated by steroid treatment.  We shall continue Actos in      pre-admission dosage, but place patient on a carbohydrate-modified diet      and a sliding-scale insulin coverage.  We shall also check HBA1C for      completeness.   1.  Gastroesophageal reflux disease:  Treat with PPI.   1.  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   Further management will depend on clinical course.      Isidor Holts, M.D.  Electronically Signed     CO/MEDQ  D:  12/17/2005  T:  12/17/2005  Job:  [id number]   cc:   Oley Balm. Sung Amabile, M.D. [facility]  [address]  [place]  [place] [address]

## 2011-04-16 NOTE — Discharge Summary (Signed)
NAME:  [name] [name], [name] [name] NO.:  [id number]   MEDICAL RECORD NO.:  [id number]          PATIENT TYPE:  INP   LOCATION:  4734                         FACILITY:  [facility]   PHYSICIAN:  Charlaine Dalton. Sherene Sires, M.D. [facility] OF BIRTH:  12-Feb-1976   DATE OF ADMISSION:  06/28/2006  DATE OF DISCHARGE:  06/30/2006                                 DISCHARGE SUMMARY   FINAL DIAGNOSES:  1.  Status asthmaticus with acute respiratory distress and respiratory      failure.      1.  Documented nonadherence this admission and on last admission partly          due to financial issues addressed with case management.      2.  Chronic sinusitis by CT scan this admission.      3.  IgE level of 139 this admission.  2.  Morbid obesity complicated by gastroesophageal reflux disease.  3.  Morbid obesity complicated by diabetes.  4.  Morbid obesity complicated by hypertension.   HISTORY:  This is a 35 year old black female who identified herself as a  patient of mine when she came into the emergency room, but actually has not  been seen in the office recently despite actually being admitted to the  hospital with exacerbation of asthma in June with a followup in a week.  She  reported that she was maintained on Atrovent and Xopenex per nebulizer,  which were not the instructions she was given at discharge, and used Flovent  p.r.n., which was also not the instructions given at discharge (see  Discharge Summary by Dr. Kriste Basque).  She did not contact our office during that  time to my knowledge, and came to the emergency room twice for evaluation of  dyspnea with cough that had evolved over 3-5 days prior to admission with  minimal mucoid sputum production with a hacking upper airway quality with  suggestive of either reflux or rhinitis.  She had refractory wheezing on  admission despite being treated with continuous nebulizers in the emergency  room, and improved during hospitalization on IV Solu-Medrol, and  treatment  directed at reflux in the form of high-dose PBI therapy and Reglan.   Prior to discharge the patient still had minimal cough, but no dyspnea or  wheezing.  Studies were as noted above on the problem.  Otherwise,  unremarkable.   The patient is therefore being discharged in markedly improved condition,  and I spent an extra 30 minutes with her today in discussion of the  management of her asthma with the following recommendations:  1.  First I taught her to use MDI optimally, which she mastered to      approximately 90% effectiveness, and recommended Symbicort 160/4.5 two      b.i.d. as maintenance, not p.r.n.  I emphasized this point repeatedly to      her, and had her repeat it back to me.  2.  Avalide 300/12.5 one daily.  3.  NovoLog sliding scale insulin as before admission.  4.  Nexium 40 mg before breakfast daily.  5.  Prednisone 10 mg tablets number 30  to be tapered off over 12 days with      office visit within the next 2 weeks.  6.  As needed for cough she can use Mucinex DM 2 b.i.d. plus Ultram 50 mg 1      q.4 p.r.n.  7.  For subjective wheezing and dyspnea she can use Xopenex 1.25 mg q.4      p.r.n.   The patient was seen by case management who arranged to help with issues  related to child care, and the patient was advised on a low-salt, low-starch  diet.   Note should be made that the patient's diabetes did flare during  hospitalization on IV steroids, which was switched to p.o. on discharge and  will be tapered off quickly with need for continued followup for both  diabetes and hypertension by her primary care physician.  Prior to  discharge, she was ambulatory within her room on room air with adequate  saturations.           ______________________________  Charlaine Dalton Sherene Sires, M.D. [facility]     MBW/MEDQ  D:  06/30/2006  T:  06/30/2006  Job:  [id number]   cc:   Thomos Lemons, D.O. [facility]

## 2011-04-16 NOTE — H&P (Signed)
NAME:  [name] [name], [name] [name] NO.:  [id number]   MEDICAL RECORD NO.:  [id number]           PATIENT TYPE:   LOCATION:                                 FACILITY:   PHYSICIAN:  Della Goo, M.D.      DATE OF BIRTH:   DATE OF ADMISSION:  09/17/2006  DATE OF DISCHARGE:                                HISTORY & PHYSICAL   CHIEF COMPLAINT:  Worsening shortness of breath.   HISTORY OF PRESENT ILLNESS:  This is a 35 year old female with a history of  asthma who was brought to the emergency department via EMS secondary to  worsening shortness of breath.  Patient reports having increasing shortness  over 3 days along with cough, congestion and greenish sputum production.  She reports having no fever or chills.  She does report using her nebulizers  without relief.  The patient has a history of hypertension and diabetes;  however, has been out of medications for approximately 3 weeks.   The patient was evaluated in the emergency department by the emergency room  physician, treated with nebulizers and supplemental oxygen therapy; however,  had minimal relief.  The patient was also started on Solu-Medrol IV.  Patient still without significant improvement, however, patient almost left  AMA secondary to social issues of which her children are without someone to  watch them.  Patient was advised of all of the risks and did change her mind  and remain for admission.   PAST MEDICAL HISTORY:  As mentioned above,  1. Diabetes.  2. Hypertension.  3. Gastroesophageal reflux disease.  4. Asthma.   MEDICATIONS:  1. Xopenex nebulizer treatments.  2. Albuterol inhaler q.i.d. p.r.n.  3. Avalide.  4. NovoLog regular insulin p.r.n.  5. Prilosec 20 mg one p.o. daily.  6. Ultram p.r.n. pain.  7. Atrovent nebulizer treatments.   SHE HAS ALLERGIES TO:  1. MOTRIN.  2. POSSIBLY ASPIRIN PRODUCTS.   SOCIAL HISTORY:  Nonsmoker, nondrinker.   Patient has 3 sons and is unemployed and lives  in a shelter at this time.  The patient reports just moving from [place], [place].   REVIEW OF SYSTEMS:  Pertinence mentioned above.   PHYSICAL EXAMINATION FINDINGS:  A morbidly obese 35 year old female in acute  distress.  VITAL SIGNS:  Temperature 98.4, blood pressure 192/119, heart rate 139,  respirations 22, O2 saturations 93% on 2L of nasal cannula oxygen.  HEENT:  Normocephalic, atraumatic.  No scleral icterus.  Pupils equally  round, reactive to light.  Extraocular muscles are intact.  OROPHARYNX:  Oral mucosa dry, no exudates.  NECK:  Supple.  Full range of motion.  No thyromegaly, adenopathy, jugular  venous distention.  CARDIOVASCULAR:  Tachycardic rate, rhythm.  LUNGS:  Decreased breath sounds bilaterally.  Positive expiratory wheezes  throughout.  ABDOMEN:  Obese, positive bowel sounds, soft, nontender, nondistended.  EXTREMITIES:  Without edema.  NEUROLOGIC EXAMINATION:  Alert and oriented x3.  Nonfocal.  GENITOURINARY AND RECTAL EXAMINATION:  Deferred.   LABORATORY STUDIES:  Pending.   ASSESSMENT:  A 35 year old female with worsening shortness of breath  admitted secondary to acute asthma  exacerbation.   PRIMARY DIAGNOSES:  1. Asthma exacerbation.  2. Upper respiratory infection.  3. Hypertension with reactive hypotension.  4. Type 2 diabetes mellitus.  5. Gastroesophageal reflux disease.  6. Morbid obesity.  7. MOTRIN AND ASPIRIN SENSITIVITY, MOTRIN CAUSES ANAPHYLAXIS.   PLAN:  Patient has been admitted for continued therapy, will be placed on a  high dose steroid taper, nebulizer treatments and supplemental oxygen.  She  will also be placed on azithromycin oral antibiotic therapy to cover  atypicals and upper respiratory infection.  She will be placed on clonidine  p.r.n. elevated blood pressures and restarted on her Avalide therapy.  She  will also be placed on sliding scale insulin coverage with glucose checks  q.4h.  An evaluation for case  management will be requested secondary to  patient's health care and living circumstances.      Della Goo, M.D.  Electronically Signed     HJ/MEDQ  D:  09/17/2006  T:  09/18/2006  Job:  [id number]

## 2011-04-24 ENCOUNTER — Emergency Department (HOSPITAL_COMMUNITY)
Admission: EM | Admit: 2011-04-24 | Discharge: 2011-04-24 | Disposition: A | Discharge status: Home / Self Care | Payer: Medicare Other | Attending: Emergency Medicine | Admitting: Emergency Medicine

## 2011-04-24 DIAGNOSIS — R0602 Shortness of breath: Secondary | ICD-10-CM | POA: Insufficient documentation

## 2011-04-24 DIAGNOSIS — I10 Essential (primary) hypertension: Secondary | ICD-10-CM | POA: Insufficient documentation

## 2011-04-24 DIAGNOSIS — R509 Fever, unspecified: Secondary | ICD-10-CM | POA: Insufficient documentation

## 2011-04-24 DIAGNOSIS — J45909 Unspecified asthma, uncomplicated: Secondary | ICD-10-CM | POA: Insufficient documentation

## 2011-04-25 ENCOUNTER — Emergency Department (HOSPITAL_COMMUNITY): Payer: Medicare Other

## 2011-04-25 ENCOUNTER — Emergency Department (HOSPITAL_COMMUNITY)
Admission: EM | Admit: 2011-04-25 | Discharge: 2011-04-25 | Disposition: A | Discharge status: Home / Self Care | Payer: Medicare Other | Attending: Emergency Medicine | Admitting: Emergency Medicine

## 2011-04-25 DIAGNOSIS — I10 Essential (primary) hypertension: Secondary | ICD-10-CM | POA: Insufficient documentation

## 2011-04-25 DIAGNOSIS — E669 Obesity, unspecified: Secondary | ICD-10-CM | POA: Insufficient documentation

## 2011-04-25 DIAGNOSIS — E119 Type 2 diabetes mellitus without complications: Secondary | ICD-10-CM | POA: Insufficient documentation

## 2011-04-25 DIAGNOSIS — R0602 Shortness of breath: Secondary | ICD-10-CM | POA: Insufficient documentation

## 2011-04-25 DIAGNOSIS — J45909 Unspecified asthma, uncomplicated: Secondary | ICD-10-CM | POA: Insufficient documentation

## 2011-04-29 ENCOUNTER — Emergency Department (HOSPITAL_COMMUNITY): Payer: Medicare Other

## 2011-04-29 ENCOUNTER — Emergency Department (HOSPITAL_COMMUNITY)
Admission: EM | Admit: 2011-04-29 | Discharge: 2011-04-29 | Disposition: A | Discharge status: Home / Self Care | Payer: Medicare Other | Attending: Emergency Medicine | Admitting: Emergency Medicine

## 2011-04-29 DIAGNOSIS — R0789 Other chest pain: Secondary | ICD-10-CM | POA: Insufficient documentation

## 2011-04-29 DIAGNOSIS — R05 Cough: Secondary | ICD-10-CM | POA: Insufficient documentation

## 2011-04-29 DIAGNOSIS — R0609 Other forms of dyspnea: Secondary | ICD-10-CM | POA: Insufficient documentation

## 2011-04-29 DIAGNOSIS — J45909 Unspecified asthma, uncomplicated: Secondary | ICD-10-CM | POA: Insufficient documentation

## 2011-04-29 DIAGNOSIS — R0602 Shortness of breath: Secondary | ICD-10-CM | POA: Insufficient documentation

## 2011-04-29 DIAGNOSIS — R509 Fever, unspecified: Secondary | ICD-10-CM | POA: Insufficient documentation

## 2011-04-29 DIAGNOSIS — R059 Cough, unspecified: Secondary | ICD-10-CM | POA: Insufficient documentation

## 2011-04-29 DIAGNOSIS — R0989 Other specified symptoms and signs involving the circulatory and respiratory systems: Secondary | ICD-10-CM | POA: Insufficient documentation

## 2011-04-29 DIAGNOSIS — Z79899 Other long term (current) drug therapy: Secondary | ICD-10-CM | POA: Insufficient documentation

## 2011-04-29 LAB — GLUCOSE, CAPILLARY: Glucose-Capillary: 152 mg/dL — ABNORMAL HIGH (ref 70–99)

## 2011-04-30 ENCOUNTER — Emergency Department (HOSPITAL_COMMUNITY)
Admission: EM | Admit: 2011-04-30 | Discharge: 2011-05-01 | Disposition: A | Discharge status: Other Acute Inpatient Hospital | Payer: Medicare Other | Source: Home / Self Care | Attending: Emergency Medicine | Admitting: Emergency Medicine

## 2011-04-30 LAB — CBC
HCT: 34.8 % — ABNORMAL LOW (ref 36.0–46.0)
Platelets: 315 10*3/uL (ref 150–400)
RDW: 24.8 % — ABNORMAL HIGH (ref 11.5–15.5)
WBC: 8.4 10*3/uL (ref 4.0–10.5)

## 2011-04-30 LAB — DIFFERENTIAL
Basophils Absolute: 0 10*3/uL (ref 0.0–0.1)
Lymphs Abs: 2.3 10*3/uL (ref 0.7–4.0)
Monocytes Absolute: 0.8 10*3/uL (ref 0.1–1.0)
Monocytes Relative: 10 % (ref 3–12)

## 2011-04-30 LAB — BASIC METABOLIC PANEL
GFR calc non Af Amer: >60 mL/min (ref >60)
Potassium: 3.2 mEq/L — ABNORMAL LOW (ref 3.5–5.1)
Sodium: 138 mEq/L (ref 135–145)

## 2011-05-01 ENCOUNTER — Inpatient Hospital Stay (HOSPITAL_COMMUNITY)
Admission: EM | Admit: 2011-05-01 | Discharge: 2011-05-04 | DRG: 202 | Disposition: A | Discharge status: Home / Self Care | Payer: Medicare Other | Attending: Internal Medicine | Admitting: Internal Medicine

## 2011-05-01 ENCOUNTER — Emergency Department (HOSPITAL_COMMUNITY): Payer: Medicare Other

## 2011-05-01 DIAGNOSIS — IMO0001 Reserved for inherently not codable concepts without codable children: Secondary | ICD-10-CM | POA: Diagnosis present

## 2011-05-01 DIAGNOSIS — D509 Iron deficiency anemia, unspecified: Secondary | ICD-10-CM | POA: Diagnosis present

## 2011-05-01 DIAGNOSIS — J9600 Acute respiratory failure, unspecified whether with hypoxia or hypercapnia: Secondary | ICD-10-CM | POA: Diagnosis present

## 2011-05-01 DIAGNOSIS — J45901 Unspecified asthma with (acute) exacerbation: Principal | ICD-10-CM | POA: Diagnosis present

## 2011-05-01 DIAGNOSIS — Z91199 Patient's noncompliance with other medical treatment and regimen due to unspecified reason: Secondary | ICD-10-CM

## 2011-05-01 DIAGNOSIS — F341 Dysthymic disorder: Secondary | ICD-10-CM | POA: Diagnosis present

## 2011-05-01 DIAGNOSIS — IMO0002 Reserved for concepts with insufficient information to code with codable children: Secondary | ICD-10-CM

## 2011-05-01 DIAGNOSIS — Z79899 Other long term (current) drug therapy: Secondary | ICD-10-CM

## 2011-05-01 DIAGNOSIS — Z9119 Patient's noncompliance with other medical treatment and regimen: Secondary | ICD-10-CM

## 2011-05-01 DIAGNOSIS — K219 Gastro-esophageal reflux disease without esophagitis: Secondary | ICD-10-CM | POA: Diagnosis present

## 2011-05-01 DIAGNOSIS — I10 Essential (primary) hypertension: Secondary | ICD-10-CM | POA: Diagnosis present

## 2011-05-01 LAB — CBC
HCT: 35 % — ABNORMAL LOW (ref 36.0–46.0)
Platelets: 379 10*3/uL (ref 150–400)
RDW: 25.4 % — ABNORMAL HIGH (ref 11.5–15.5)
WBC: 11.7 10*3/uL — ABNORMAL HIGH (ref 4.0–10.5)

## 2011-05-01 LAB — GLUCOSE, CAPILLARY
Glucose-Capillary: 272 mg/dL — ABNORMAL HIGH (ref 70–99)
Glucose-Capillary: 353 mg/dL — ABNORMAL HIGH (ref 70–99)

## 2011-05-01 LAB — PRO B NATRIURETIC PEPTIDE: Pro B Natriuretic peptide (BNP): 24 pg/mL (ref 0–125)

## 2011-05-01 LAB — DIFFERENTIAL
Eosinophils Relative: 0 % (ref 0–5)
Monocytes Absolute: 1.5 10*3/uL — ABNORMAL HIGH (ref 0.1–1.0)
Neutrophils Relative %: 63 % (ref 43–77)

## 2011-05-01 LAB — COMPREHENSIVE METABOLIC PANEL
ALT: 17 U/L (ref 0–35)
Albumin: 3.8 g/dL (ref 3.5–5.2)
Alkaline Phosphatase: 70 U/L (ref 39–117)
Potassium: 3.4 mEq/L — ABNORMAL LOW (ref 3.5–5.1)
Sodium: 142 mEq/L (ref 135–145)
Total Protein: 6.6 g/dL (ref 6.0–8.3)

## 2011-05-01 LAB — TSH: TSH: 0.133 u[IU]/mL — ABNORMAL LOW (ref 0.350–4.500)

## 2011-05-01 LAB — MRSA PCR SCREENING: MRSA by PCR: POSITIVE — AB

## 2011-05-01 LAB — BASIC METABOLIC PANEL
GFR calc non Af Amer: >60 mL/min (ref >60)
Glucose, Bld: 195 mg/dL — ABNORMAL HIGH (ref 70–99)
Potassium: 3 mEq/L — ABNORMAL LOW (ref 3.5–5.1)
Sodium: 145 mEq/L (ref 135–145)

## 2011-05-01 LAB — LIPID PANEL: VLDL: 11 mg/dL (ref 0–40)

## 2011-05-02 LAB — COMPREHENSIVE METABOLIC PANEL
ALT: 15 U/L (ref 0–35)
AST: 14 U/L (ref 0–37)
Albumin: 3.4 g/dL — ABNORMAL LOW (ref 3.5–5.2)
CO2: 27 mEq/L (ref 19–32)
Calcium: 9.3 mg/dL (ref 8.4–10.5)
GFR calc Af Amer: >60 mL/min (ref >60)
GFR calc non Af Amer: >60 mL/min (ref >60)
Sodium: 140 mEq/L (ref 135–145)

## 2011-05-02 LAB — CBC
Hemoglobin: 10 g/dL — ABNORMAL LOW (ref 12.0–15.0)
MCHC: 30.2 g/dL (ref 30.0–36.0)
Platelets: 327 10*3/uL (ref 150–400)
RBC: 4.5 MIL/uL (ref 3.87–5.11)

## 2011-05-02 LAB — GLUCOSE, CAPILLARY
Glucose-Capillary: 296 mg/dL — ABNORMAL HIGH (ref 70–99)
Glucose-Capillary: 231 mg/dL — ABNORMAL HIGH (ref 70–99)

## 2011-05-03 DIAGNOSIS — J45909 Unspecified asthma, uncomplicated: Secondary | ICD-10-CM

## 2011-05-03 LAB — GLUCOSE, CAPILLARY
Glucose-Capillary: 287 mg/dL — ABNORMAL HIGH (ref 70–99)
Glucose-Capillary: 200 mg/dL — ABNORMAL HIGH (ref 70–99)
Glucose-Capillary: 255 mg/dL — ABNORMAL HIGH (ref 70–99)

## 2011-05-03 LAB — CBC
Hemoglobin: 10.6 g/dL — ABNORMAL LOW (ref 12.0–15.0)
MCH: 22.3 pg — ABNORMAL LOW (ref 26.0–34.0)
Platelets: UNDETERMINED 10*3/uL (ref 150–400)
RBC: 4.76 MIL/uL (ref 3.87–5.11)
WBC: 10.3 10*3/uL (ref 4.0–10.5)

## 2011-05-03 LAB — BASIC METABOLIC PANEL
CO2: 24 mEq/L (ref 19–32)
Chloride: 98 mEq/L (ref 96–112)
GFR calc Af Amer: >60 mL/min (ref >60)
Potassium: 3.8 mEq/L (ref 3.5–5.1)
Sodium: 135 mEq/L (ref 135–145)

## 2011-05-03 LAB — GLUCOSE, RANDOM: Glucose, Bld: 484 mg/dL — ABNORMAL HIGH (ref 70–99)

## 2011-05-03 LAB — T3, FREE: T3, Free: 2.8 pg/mL (ref 2.3–4.2)

## 2011-05-03 LAB — T4, FREE: Free T4: 0.8 ng/dL (ref 0.80–1.80)

## 2011-05-04 LAB — GLUCOSE, CAPILLARY: Glucose-Capillary: 473 mg/dL — ABNORMAL HIGH (ref 70–99)

## 2011-05-19 ENCOUNTER — Encounter: Payer: Self-pay | Admitting: Emergency Medicine

## 2011-05-20 ENCOUNTER — Inpatient Hospital Stay: Payer: Medicare Other | Admitting: Emergency Medicine

## 2011-05-20 ENCOUNTER — Telehealth: Payer: Self-pay | Admitting: *Deleted

## 2011-05-20 NOTE — Telephone Encounter (Signed)
Pt NOS her HFU with Dr. Delton Coombes this morning. Per RB, this needs to be rescheduled. LMOMTCB x 1.

## 2011-05-23 NOTE — Discharge Summary (Signed)
NAME:  [name], [name] NO.:  [id number]  MEDICAL RECORD NO.:  [id number]  LOCATION:  5151                         FACILITY:  [facility]  PHYSICIAN:  [name] Scott, MD     DATE OF BIRTH:  [birth year]  DATE OF ADMISSION:  05/01/2011 DATE OF DISCHARGE:  05/04/2011                              DISCHARGE SUMMARY   PRIMARY CARE PHYSICIAN:  The patient did not have one prior to admission but has an appointment to see Rosalyn Gess. Norins, MD  PULMONOLOGIST:  (New) Leslye Peer, MD  DISCHARGE DIAGNOSES: 1. Acute respiratory failure secondary to acute asthma exacerbation,     resolved. 2. Severe persistent asthma. 3. Uncontrolled type 2 diabetes mellitus. 4. Hypertension. 5. Chronic anemia. 6. Abnormal TSH. 7. Morbid obesity. 8. Medication noncompliance. 9. Gastroesophageal reflux disease. 10.Question history of anxiety/depression.  DISCHARGE MEDICATIONS: 1. Amlodipine 10 mg p.o. daily. 2. Flonase 2 sprays nasally b.i.d. 3. Claritin 10 mg p.o. daily. 4. Singulair 10 mg p.o. daily. 5. Protonix 40 mg p.o. b.i.d. for a month, then once daily. 6. Prednisone taper as per directions. 7. Spiriva 18 mcg inhaled daily. 8. Metformin 1000 mg p.o. b.i.d. for 2 weeks, then 500 mg p.o. b.i.d. 9. Alprazolam 0.25 mg p.o. b.i.d. p.r.n. for anxiety, 15 tablets     prescribed. 10.Avalide 300/25, 1 tablet p.o. daily. 11.Diphenhydramine over the counter 50 mg p.o. at bedtime p.r.n. for     insomnia. 12.DuoNeb 0.5 mg/2.5 mg 1 nebulization inhaled q.6 hourly p.r.n. for     dyspnea. 13.Mucinex LA over the counter 600 mg p.o. b.i.d. 14.Iron over the counter 1 tablet p.o. daily. 15.Multivitamins 1 tablet p.o. daily. 16.Potassium chloride 20 mEq p.o. daily. 17.Saline nasal spray over the counter 1 spray nasally t.i.d. p.r.n.     for nasal congestion. 18.Symbicort 160/4.5 mcg 2 puffs inhaled b.i.d.  DISCONTINUED MEDICATIONS: 1. Zantac. 2. Moxifloxacin. 3. 20 mg of  prednisone.  IMAGING: 1. Chest x-ray on May 01, 2011, impression, no acute cardiopulmonary     process seen.  Evaluation suboptimal due to motion artifact. 2. Chest x-ray, Apr 29, 2011, impression, no acute pulmonary     infiltrates.  Mild bronchitic changes.  LAB DATA:  Free T3 2.83, T4 0.80, TSH 0.231.  CBC shows hemoglobin 10.6, hematocrit 34, white blood cell 10.3, and platelet counts were normal. Basic metabolic panel only significant for glucose 325.  Hepatic panel only significant for albumin of 3.4.  Hemoglobin A1c 6.6.  Pro-BNP 24. Lipid panel within normal limits.  CONSULTATIONS:  Pulmonary MD, Leslye Peer, MD.  DIET:  Diabetic diet.  ACTIVITIES:  Ad lib.  COMPLAINTS:  None.  The patient indicates that she has ambulated in the hallways briskly without any oxygen and indicates that her breathing is back to baseline.  She denies any dyspnea or chest tightness or cough.  PHYSICAL EXAMINATION:  GENERAL:  [name] is morbidly obese.  She is in no obvious distress.  She is ambulating in the hallways. VITAL SIGNS:  Temperature 98.8 degrees Fahrenheit, pulse 100 per minute and regular, respirations 18 per minute, blood pressure 139/82 mmHg, saturating at 100% on room air.  CBGs today range in the 232-249 mg/dL. RESPIRATORY SYSTEM:  Clear.  No increased work of breathing. CARDIOVASCULAR SYSTEM:  First and second heart sounds heard and regular. ABDOMEN:  Soft and bowel sounds present.  Nondistended. CENTRAL NERVOUS SYSTEM:  The patient is awake, alert, and oriented x3 with no focal neurological deficits.  HOSPITAL COURSE:  [name] is a 35 year old morbidly obese female patient with history of asthma, gastroesophageal reflux disease, type 2 diabetes mellitus, hypertension, who has moved from [place] to the [place] area in November 2011.  She indicates that her asthma was diagnosed by lung function test.  She has been intubated for mechanical ventilation in  2003.  Since moving to [place], she has had 5 hospitalizations for worsening dyspnea and asthma exacerbation.  She apparently ran out of her medications 2-3 days prior to admission and had some exposure to cut grass and noted asthma attack.  She also indicates that she has bad reflux and acute sinus infection prior to admission.  She indicates symptoms every day, but also not compliant with different aspects of regimen.  [facility] were requested to admit her for further evaluation and management.  1. Acute asthma exacerbation or status asthmaticus.  The patient had     to be placed on CPAP in the emergency room.  She was admitted and     started on IV steroids, nebulizing treatments, oxygen, and     antibiotics were continued.  She improved.  However, given her     history of poorly controlled asthma, Pulmonary consultation was     requested and provided kindly by Dr. Delton Coombes.  He has seen her again     today and cleared her for discharge.  She has a followup     appointment with him in the office in couple of weeks' time.  The     patient has been advised compliance with her medications.  She     verbalizes understanding. 2. Uncontrolled type 2 diabetes mellitus.  In the hospital, her sugars     have been high, most likely secondary to steroids but as an     outpatient, her diabetes control seems to be good.  We will rapidly     taper her prednisone and temporarily increase her metformin dose to     cover for this. 3. Hypertension.  The patient may have to come off her ARBs if vocal     cord dysfunction becomes the cause for her dyspnea. 4. Chronic anemia which is stable and can be evaluated as an     outpatient. 5. Abnormal TSH.  Clinically, the patient is euthyroid.  This can be     repeated in 4-6 weeks' time. 6. Morbid obesity.  The patient is advised to lose weight and     exercise. 7. Gastroesophageal reflux disease.  Proton pump inhibitor management     as  above.  DISPOSITION:  The patient was discharged home in stable condition.  FOLLOWUP RECOMMENDATIONS: 1. With Dr. Levy Pupa on May 20, 2011, at 10:30 a.m. or to call as     needed. 2. With Dr. Illene Regulus on June 24, 2011 at 10 a.m.  Time taken in coordinating this discharge was 30 minutes.     [name] Scott, MD     AH/MEDQ  D:  05/04/2011  T:  05/05/2011  Job:  [id number]  cc:   Leslye Peer, MD Rosalyn Gess. Norins, MD  Electronically Signed by [name] Scott MD on 05/23/2011 12:53:27 AM

## 2011-05-26 NOTE — Telephone Encounter (Signed)
Pt rescheduled for Wed., 06/09/2011 w/ RB for HFU.

## 2011-05-30 NOTE — H&P (Signed)
NAME:  SEATTLE, DALPORTO NO.:  [id number]  MEDICAL RECORD NO.:  [id number]           PATIENT TYPE:  E  LOCATION:  [facility]                         FACILITY:  [facility]  PHYSICIAN:  Lonia Blood, M.D.      DATE OF BIRTH:  1976/09/18  DATE OF ADMISSION:  05/01/2011 DATE OF DISCHARGE:                             HISTORY & PHYSICAL   PRIMARY CARE PHYSICIAN:  She is unassigned to [place].  PRESENTING COMPLAINT:  Shortness of breath.  HISTORY OF PRESENT ILLNESS:  The patient is a 35 year old morbidly obese female who has been having shortness of breath and wheezing for the past 1 week.  She has been in the ED multiple times, has received treatment. She gets better in the emergency room.  Once she gets home, symptoms tend to come back.  She is back again today in respiratory distress and is placed on CPAP in the emergency room.  She is wheezing again.  She apparently seemed to have failed outpatient treatment.  Denied any chestpain, but has some mild cough which is nonproductive.  No hemoptysis. No nausea, vomiting, diarrhea, or abdominal pain.  No dizziness.  PAST MEDICAL HISTORY:  Significant for asthma, GERD, diabetes, hypertension, and morbid obesity.  ALLERGIES: 1. ASPIRIN. 2. IBUPROFEN.  MEDICATIONS: 1. Saline nasal spray 1 spray t.i.d. 2. Potassium chloride 20 mEq daily. 3. Multivitamin 1 tablet daily. 4. Iron over-the-counter 1 tablet daily. 5. DuoNeb every 6 hours. 6. Alprazolam 0.25 mg twice a day. 7. Zantac 300 mg twice a day. 8. Symbicort inhaler 160/4.5 mcg 2 puffs twice daily. 9. Prednisone 20 mg daily. 10.Avelox 400 mg daily. 11.Metformin 500 mg twice a day. 12.Guaifenesin 600 mg twice daily. 13.Diphenhydramine 25 mg 2 tablets daily at bedtime. 14.Avalide tablets 325 one tablet daily.  SOCIAL HISTORY:  The patient lives in [place].  She denied tobacco, alcohol, or IV drug use.  FAMILY HISTORY:  Denied any significant family history except  for hypertension.  REVIEW OF SYSTEMS:  All systems reviewed and negative except per HPI.  PHYSICAL EXAMINATION:  VITAL SIGNS:  Temperature is 98.3 axillary, blood pressure 180/110 with a pulse of 131, respiratory rate 18, and sats 100% on room air. GENERAL:  The patient is morbidly obese, pleasant, in mild respiratory distress. HEENT:  PERRL.  EOMI.  No pallor.  No rhinorrhea. NECK:  Supple.  No visible JVD.  No lymphadenopathy. RESPIRATORY:  She has decreased air entry bilaterally with marked expiratory wheezing, currently on CPAP. CARDIOVASCULAR:  She is tachycardic. ABDOMEN:  Obese, soft, and nontender with positive bowel sounds. EXTREMITIES:  No edema, cyanosis, or clubbing. SKIN:  No significant rashes or ulcers. MUSCULOSKELETAL:  No joint swelling or tenderness.  LABORATORY DATA:  Her chest x-ray showed no acute cardiopulmonary process.  Her white count at the moment is 11.7, hemoglobin 10.6 with platelet 379 while the rest of the labs are still pending.  ASSESSMENT:  This is a 35 year old female with status asthmaticus whohas failed treatment within the last week as an outpatient.  PLAN: 1. Status asthmaticus.  We will admit the patient to the hospital,     start her on IV  steroids and IV nebulizers, and maybe use Xopenex     if her tachycardia is persistent rather than using albuterol.  I     will also continue with her antibiotics for possible bronchitis     that may have triggered the asthma attack.  Put her on oxygen with     or without the CPAP at this point. 2. GERD.  Continue with PPIs in the hospital. 3. Diabetes.  I will continue with the metformin as well as sliding     scale insulin as well as Avalide. 4. Hypertension.  Again, we will continue with her Avalide while in     the hospital.  Further treatment will depend on how fast the     patient responds to these measures.     Lonia Blood, M.D.     Verlin Grills  D:  05/01/2011  T:  05/01/2011  Job:   [id number]  Electronically Signed by Lonia Blood M.D. on 05/30/2011 12:26:32 AM

## 2011-06-06 ENCOUNTER — Inpatient Hospital Stay (HOSPITAL_COMMUNITY)
Admission: EM | Admit: 2011-06-06 | Discharge: 2011-06-10 | DRG: 208 | Disposition: A | Discharge status: Home / Self Care | Payer: Medicare Other | Attending: Internal Medicine | Admitting: Internal Medicine

## 2011-06-06 ENCOUNTER — Emergency Department (HOSPITAL_COMMUNITY): Payer: Medicare Other

## 2011-06-06 DIAGNOSIS — J9620 Acute and chronic respiratory failure, unspecified whether with hypoxia or hypercapnia: Principal | ICD-10-CM | POA: Diagnosis present

## 2011-06-06 DIAGNOSIS — J45902 Unspecified asthma with status asthmaticus: Secondary | ICD-10-CM | POA: Diagnosis present

## 2011-06-06 DIAGNOSIS — I10 Essential (primary) hypertension: Secondary | ICD-10-CM | POA: Diagnosis present

## 2011-06-06 DIAGNOSIS — R000 Tachycardia, unspecified: Secondary | ICD-10-CM | POA: Diagnosis present

## 2011-06-06 DIAGNOSIS — Z794 Long term (current) use of insulin: Secondary | ICD-10-CM

## 2011-06-06 DIAGNOSIS — Z886 Allergy status to analgesic agent status: Secondary | ICD-10-CM

## 2011-06-06 DIAGNOSIS — J339 Nasal polyp, unspecified: Secondary | ICD-10-CM | POA: Diagnosis present

## 2011-06-06 DIAGNOSIS — J329 Chronic sinusitis, unspecified: Secondary | ICD-10-CM | POA: Diagnosis present

## 2011-06-06 DIAGNOSIS — J40 Bronchitis, not specified as acute or chronic: Secondary | ICD-10-CM | POA: Diagnosis present

## 2011-06-06 DIAGNOSIS — Z91013 Allergy to seafood: Secondary | ICD-10-CM

## 2011-06-06 DIAGNOSIS — IMO0002 Reserved for concepts with insufficient information to code with codable children: Secondary | ICD-10-CM | POA: Diagnosis not present

## 2011-06-06 DIAGNOSIS — E876 Hypokalemia: Secondary | ICD-10-CM | POA: Diagnosis present

## 2011-06-06 DIAGNOSIS — E46 Unspecified protein-calorie malnutrition: Secondary | ICD-10-CM | POA: Diagnosis not present

## 2011-06-06 DIAGNOSIS — E119 Type 2 diabetes mellitus without complications: Secondary | ICD-10-CM | POA: Diagnosis present

## 2011-06-06 LAB — CBC
Hemoglobin: 11.5 g/dL — ABNORMAL LOW (ref 12.0–15.0)
MCH: 23.7 pg — ABNORMAL LOW (ref 26.0–34.0)
MCV: 72.6 fL — ABNORMAL LOW (ref 78.0–100.0)
Platelets: 296 10*3/uL (ref 150–400)
RBC: 4.86 MIL/uL (ref 3.87–5.11)
WBC: 9.8 10*3/uL (ref 4.0–10.5)

## 2011-06-06 LAB — DIFFERENTIAL
Lymphocytes Relative: 19 % (ref 12–46)
Lymphs Abs: 1.9 10*3/uL (ref 0.7–4.0)
Monocytes Relative: 6 % (ref 3–12)
Neutro Abs: 6.2 10*3/uL (ref 1.7–7.7)
Neutrophils Relative %: 63 % (ref 43–77)

## 2011-06-06 LAB — GLUCOSE, CAPILLARY: Glucose-Capillary: 303 mg/dL — ABNORMAL HIGH (ref 70–99)

## 2011-06-06 LAB — BASIC METABOLIC PANEL
CO2: 23 mEq/L (ref 19–32)
Calcium: 8.7 mg/dL (ref 8.4–10.5)
Chloride: 102 mEq/L (ref 96–112)
Creatinine, Ser: 0.77 mg/dL (ref 0.50–1.10)
Glucose, Bld: 140 mg/dL — ABNORMAL HIGH (ref 70–99)

## 2011-06-06 NOTE — H&P (Signed)
NAME:  [name] [name], [name] [name] NO.:  [id number]  MEDICAL RECORD NO.:  [id number]  LOCATION:  5509                         FACILITY:  [facility]  PHYSICIAN:  Hilary Hertz, MD      DATE OF BIRTH:  1975/12/11  DATE OF ADMISSION:  06/06/2011 DATE OF DISCHARGE:                             HISTORY & PHYSICAL   PRIMARY CARE PHYSICIAN:  The patient is to see Dr. Debby Bud at the end of this month.  PRIMARY PULMONOLOGIST:  The patient is to see Dr. Levy Pupa with [facility], however, she has not seen him yet.  CHIEF COMPLAINT:  Shortness of breath.  HISTORY OF PRESENT ILLNESS:  The patient is a 35 year old woman with asthma, diabetes, and hypertension who presented with shortness of breath today.  The patient reports that she ran out of her Symbicort last week, Wednesday, because she was unable to make her appointment with her new pulmonologist and her old pulmonologist in [place] was unable to give the patient a refill on her medications, so she has been out of Symbicort since Wednesday and then over the past few days has felt short of breath and has noticed increased wheezing. This has been exacerbated especially by the heat, however, the patient has not been able to stay out of the heat because she has had a lot of errands run with her children.  When this episode initially started, the patient was able to take her home nebulizer and that helped her symptoms and then over the course of the next few days, she continued to have shortness of breath and the nebulizers were not working for her any longer.  Today, she did not have any relief of her shortness of breath  with her nebulizers.  She has also been coughing up green/yellow sputum and feels like she may have had a sinus infection.  There are no sick contacts.  She also had some chest tightness.  At this time, after getting Solu-Medrol 125 mg by EMS and multiple nebulizers, the patient feels "a lot  better."  REVIEW OF SYSTEMS:  Review of 10 organ systems was done and is negative except as stated above in the HPI.  ALLERGIES: 1. ASPIRIN. 2. IBUPROFEN. 3. SEAFOOD/SHELLFISH.  MEDICATIONS: 1. Symbicort 160/4.5 mcg 2 puffs twice daily. 2. Saline nasal spray, 1 spray 3 times a day as needed. 3. Pantoprazole 40 mg twice daily. 4. Potassium chloride 20 mEq daily. 5. Multivitamin daily. 6. Montelukast 10 mg daily. 7. Metformin 500 mg twice a day. 8. Loratadine 10 mg daily. 9. Iron daily. 10.Fluticasone 50 mcg nasal spray, 2 sprays twice daily. 11.Diphenhydramine 50 mg daily at bedtime as needed. 12.Avalide 300/25 mg 1 tablet daily. 13.Amlodipine 10 mg daily. 14.Alprazolam 0.25 one tablet twice daily as needed for anxiety.  PAST MEDICAL HISTORY: 1. Nasal polyps. 2. Anxiety. 3. Hypertension. 4. Surgery for nasal polyps. 5. Asthma requiring intubation in 2003 and CPAP in the emergency     department 1 month ago as well as in 2006. 6. Obesity.  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  No alcohol or drugs.  FAMILY HISTORY:  No  family history of asthma.  Sister had "heart surgery."  PHYSICAL EXAMINATION:  VITAL SIGNS:  The patient's current systolic blood pressure is in the 160s, on presentation she was 98.5, 149/98, heart rate of 129, respirations 36, and 96% on room air. GENERAL:  The patient is in no distress and can speak full sentences. HEENT:  Mucous membranes moist.  Sclerae anicteric. CV:  Tachy, regular.  No murmurs. LUNGS:  Good air movement bilaterally with expiratory wheezes throughout.  Nonlabored respiratory effort. ABDOMEN:  Obese, soft, nontender, and nondistended. EXTREMITIES:  No lower extremity edema. SKIN:  No rash. PSYCH:  Anxious but appropriate. NEURO:  Nonfocal.  LABORATORY DATA:  White count 9.8, hemoglobin 11.5, and platelets 296. Sodium 139, potassium 3.0, chloride 102,  bicarb 23, glucose 148, BUN 5, creatinine 0.77.  Chest x-ray:  No acute disease.  ASSESSMENT AND PLAN:  The patient is a 35 year old woman with asthma, hypertension, and diabetes who presents with shortness of breath consistent with asthma exacerbation. 1. Asthma exacerbation.  The patient's chest x-ray is negative for any     infiltrates.  We will check an ABG.  This current episode is likely     triggered by running out of her Symbicort in addition to being     exposed to heat and humidity which is a known trigger for the     patient.  At this time, she feels much improved after Solu-Medrol     IV and multiple nebulizers in the emergency department.  We will     continue standing nebulizers with p.r.n. nebulizers and we will     continue the patient's home inhalers as well.  We will treat her     for a potential sinus infection with a Z-Pak which may have been     contributing to this current exacerbation.  We will continue the     patient on Singulair.  Will put the pt on a steroid burst.   2. Hypokalemia.  The patient was given supplementation in the     emergency department.  We will recheck a BMET in the morning. 3. Tachycardia.  This is likely due to multiple albuterol nebulizers     as well as respiratory distress.  If the patient is persistently     tachycardic, we will check an EKG in the morning. 4. Nasal polyps.  Continue the patient on nasal sprays and Claritin. 5. Diabetes.  Hold the patient's metformin in case any contrast     studies are needed this admission, we will put the patient on     sliding scale insulin. 6. Hypertension.  Continue the patient's home medications. 7. Fluids, electrolytes, and nutrition.  Hep-Lock IV.  Replace     electrolytes as above.  We will put the patient on a heart-healthy     diabetic diet. 8. Prophylaxis.  Lovenox for deep venous thrombosis prophylaxis. 9. Disposition.  The patient has had prior intubations for her asthma     and at this  time she is thinking about her code status and she will     get back to the team with her decision on that.  The patient will     be admitted to [facility] Team 2 for further evaluation.          ______________________________ Hilary Hertz, MD     JF/MEDQ  D:  06/06/2011  T:  06/06/2011  Job:  [id number]  Electronically Signed by Hilary Hertz MD on 06/06/2011 10:28:23 PM

## 2011-06-07 ENCOUNTER — Observation Stay (HOSPITAL_COMMUNITY): Payer: Medicare Other

## 2011-06-07 DIAGNOSIS — J45902 Unspecified asthma with status asthmaticus: Secondary | ICD-10-CM

## 2011-06-07 DIAGNOSIS — J9600 Acute respiratory failure, unspecified whether with hypoxia or hypercapnia: Secondary | ICD-10-CM

## 2011-06-07 LAB — BLOOD GAS, ARTERIAL
Acid-base deficit: 0.5 mmol/L (ref 0.0–2.0)
Acid-base deficit: 6.9 mmol/L — ABNORMAL HIGH (ref 0.0–2.0)
Drawn by: 32526
FIO2: 0.6 %
RATE: 16 resp/min
pCO2 arterial: 32.2 mmHg — ABNORMAL LOW (ref 35.0–45.0)
pCO2 arterial: 59.5 mmHg (ref 35.0–45.0)
pH, Arterial: 7.259 — ABNORMAL LOW (ref 7.350–7.400)
pH, Arterial: 7.355 (ref 7.350–7.400)
pO2, Arterial: 161 mmHg — ABNORMAL HIGH (ref 80.0–100.0)
pO2, Arterial: 72.1 mmHg — ABNORMAL LOW (ref 80.0–100.0)

## 2011-06-07 LAB — CBC
MCV: 73.8 fL — ABNORMAL LOW (ref 78.0–100.0)
Platelets: 263 10*3/uL (ref 150–400)
RBC: 4.69 MIL/uL (ref 3.87–5.11)
RDW: 23 % — ABNORMAL HIGH (ref 11.5–15.5)
WBC: 12.4 10*3/uL — ABNORMAL HIGH (ref 4.0–10.5)

## 2011-06-07 LAB — POCT I-STAT 3, ART BLOOD GAS (G3+)
Acid-base deficit: 2 mmol/L (ref 0.0–2.0)
O2 Saturation: 100 %
TCO2: 26 mmol/L (ref 0–100)

## 2011-06-07 LAB — GLUCOSE, CAPILLARY
Glucose-Capillary: 261 mg/dL — ABNORMAL HIGH (ref 70–99)
Glucose-Capillary: 284 mg/dL — ABNORMAL HIGH (ref 70–99)
Glucose-Capillary: 234 mg/dL — ABNORMAL HIGH (ref 70–99)

## 2011-06-07 LAB — HEMOGLOBIN A1C
Hgb A1c MFr Bld: 7.7 % — ABNORMAL HIGH (ref <5.7)
Mean Plasma Glucose: 174 mg/dL — ABNORMAL HIGH (ref <117)

## 2011-06-07 LAB — BASIC METABOLIC PANEL
CO2: 25 mEq/L (ref 19–32)
Chloride: 101 mEq/L (ref 96–112)
GFR calc Af Amer: >60 mL/min (ref >60)
Potassium: 3.3 mEq/L — ABNORMAL LOW (ref 3.5–5.1)
Sodium: 137 mEq/L (ref 135–145)

## 2011-06-08 ENCOUNTER — Inpatient Hospital Stay (HOSPITAL_COMMUNITY): Payer: Medicare Other

## 2011-06-08 LAB — BLOOD GAS, ARTERIAL
Acid-Base Excess: 1.3 mmol/L (ref 0.0–2.0)
Drawn by: 34779
FIO2: 0.3 %
MECHVT: 500 mL
O2 Saturation: 95.1 %
RATE: 16 resp/min
TCO2: 27 mmol/L (ref 0–100)
pCO2 arterial: 42.7 mmHg (ref 35.0–45.0)
pO2, Arterial: 70.2 mmHg — ABNORMAL LOW (ref 80.0–100.0)

## 2011-06-08 LAB — GLUCOSE, CAPILLARY
Glucose-Capillary: 210 mg/dL — ABNORMAL HIGH (ref 70–99)
Glucose-Capillary: 273 mg/dL — ABNORMAL HIGH (ref 70–99)

## 2011-06-08 LAB — BASIC METABOLIC PANEL
BUN: 15 mg/dL (ref 6–23)
CO2: 25 mEq/L (ref 19–32)
Chloride: 103 mEq/L (ref 96–112)
Creatinine, Ser: 1 mg/dL (ref 0.50–1.10)
Glucose, Bld: 214 mg/dL — ABNORMAL HIGH (ref 70–99)
Potassium: 3 mEq/L — ABNORMAL LOW (ref 3.5–5.1)

## 2011-06-08 LAB — CBC
HCT: 31.7 % — ABNORMAL LOW (ref 36.0–46.0)
MCH: 23.3 pg — ABNORMAL LOW (ref 26.0–34.0)
MCV: 74.6 fL — ABNORMAL LOW (ref 78.0–100.0)
RBC: 4.25 MIL/uL (ref 3.87–5.11)
WBC: 17.9 10*3/uL — ABNORMAL HIGH (ref 4.0–10.5)

## 2011-06-09 ENCOUNTER — Inpatient Hospital Stay: Payer: Medicare Other | Admitting: Emergency Medicine

## 2011-06-09 LAB — GLUCOSE, CAPILLARY
Glucose-Capillary: 258 mg/dL — ABNORMAL HIGH (ref 70–99)
Glucose-Capillary: 315 mg/dL — ABNORMAL HIGH (ref 70–99)
Glucose-Capillary: 337 mg/dL — ABNORMAL HIGH (ref 70–99)

## 2011-06-09 LAB — BASIC METABOLIC PANEL
BUN: 18 mg/dL (ref 6–23)
Calcium: 8.9 mg/dL (ref 8.4–10.5)
Creatinine, Ser: 0.75 mg/dL (ref 0.50–1.10)
GFR calc non Af Amer: >60 mL/min (ref >60)
Glucose, Bld: 225 mg/dL — ABNORMAL HIGH (ref 70–99)
Potassium: 3.2 mEq/L — ABNORMAL LOW (ref 3.5–5.1)

## 2011-06-09 LAB — CBC
HCT: 32.2 % — ABNORMAL LOW (ref 36.0–46.0)
Hemoglobin: 10.1 g/dL — ABNORMAL LOW (ref 12.0–15.0)
MCH: 23.2 pg — ABNORMAL LOW (ref 26.0–34.0)
MCHC: 31.4 g/dL (ref 30.0–36.0)
MCV: 74 fL — ABNORMAL LOW (ref 78.0–100.0)

## 2011-06-10 LAB — GLUCOSE, CAPILLARY
Glucose-Capillary: 236 mg/dL — ABNORMAL HIGH (ref 70–99)
Glucose-Capillary: 203 mg/dL — ABNORMAL HIGH (ref 70–99)
Glucose-Capillary: 248 mg/dL — ABNORMAL HIGH (ref 70–99)

## 2011-06-10 LAB — BASIC METABOLIC PANEL
CO2: 31 mEq/L (ref 19–32)
Calcium: 8.9 mg/dL (ref 8.4–10.5)
Potassium: 3.6 mEq/L (ref 3.5–5.1)
Sodium: 140 mEq/L (ref 135–145)

## 2011-06-10 LAB — PHOSPHORUS: Phosphorus: 2.7 mg/dL (ref 2.3–4.6)

## 2011-06-10 LAB — CBC
Hemoglobin: 10.3 g/dL — ABNORMAL LOW (ref 12.0–15.0)
MCHC: 32.1 g/dL (ref 30.0–36.0)
RDW: 22.7 % — ABNORMAL HIGH (ref 11.5–15.5)

## 2011-06-10 LAB — MAGNESIUM: Magnesium: 2 mg/dL (ref 1.5–2.5)

## 2011-06-13 LAB — CULTURE, BLOOD (ROUTINE X 2)
Culture  Setup Time: 201207091430
Culture: NO GROWTH
Culture: NO GROWTH

## 2011-06-14 ENCOUNTER — Inpatient Hospital Stay: Payer: Medicare Other | Admitting: Adult Health

## 2011-06-23 NOTE — Discharge Summary (Signed)
NAME:  DELMAR, DONDERO NO.:  [id number]  MEDICAL RECORD NO.:  [id number]  LOCATION:  3041                         FACILITY:  [facility]  PHYSICIAN:  Oretha Milch, MD      DATE OF BIRTH:  Apr 03, 1976  DATE OF ADMISSION:  06/06/2011 DATE OF DISCHARGE:  06/10/2011                              DISCHARGE SUMMARY   DISCHARGE DIAGNOSES: 1. Acute-on-chronic respiratory failure. 2. Asthma. 3. Hypertension. 4. Diabetes. 5. Purulent bronchitis.  HISTORY OF PRESENT ILLNESS:  Ms. Durkee is a 35 year old female with history of asthma, diabetes, and hypertension who presented on July 8 with complaints of shortness of breath.  The patient reported that she ran out of her Symbicort a week prior to admission and has been unable to make an appointment with her pulmonologist who is in [place] given a recent move to [place].  She feels that her shortness of breath was exacerbated by the heat and had increasing shortness of breath as well as wheezing.  She also had complaints of productive cough with green and yellow sputum as well as sinus symptoms including nasal drainage and maxillary sinus pain and pressure.  The patient was initially admitted by [facility], however, she decompensated quickly with worsening respiratory distress and Pulmonary Critical Care was called to see the patient.  LABORATORY DATA:  At the time of admission, July 8, CBC, white blood cells 9.8, hemoglobin 1.5, hematocrit 35.3, and platelets 296,000. Basic metabolic panel:  Sodium 139, potassium 3.0, glucose 140, BUN 5, creatinine 0.77.  Arterial blood gas on room air, pH 7.35, pCO2 32.2, pO2 72.1, and bicarb 17.6, hemoglobin A1c 7.7.  Most recent laboratory data July 12, CBC white blood cells 10.6, hemoglobin 10.3, hematocrit 32.1, platelets 248,000, phosphorus 2.7, magnesium 2.0.  Basic metabolic panel:  Sodium 140, potassium 3.6, BUN 16, creatinine 0.57, glucose 217.  RADIOLOGY DATA:  At  the time of admission July 8, portable chest x-ray shows no acute cardiopulmonary disease.  Most recent chest x-ray July 10 shows stable support apparatus including ET tube and right IJ central venous catheter, cardiac enlargement, and questionable pulmonary venous congestion.  MICRO DATA:  Blood cultures x2 on July 9, preliminary report is negative.  HOSPITAL COURSE BY DISCHARGE DIAGNOSES: 1. Acute-on-chronic respiratory failure in the setting of known asthma     complicated by likely purulent bronchitis plus or minus sinusitis.     The patient decompensated quickly after admission and did require     intubation on July 9.  She was successfully extubated on July 10     and at the time of discharge, her respiratory status was back to     her baseline.  She is saturating well on room air with no increased     work of breathing and no further wheezing.  She was treated with     nebulized bronchodilators and IV steroids.  Please see asthma for     further discussion. 2. Asthma.  The patient did acquire intubation secondary to status    asthmaticus complicated by questionable purulent bronchitis.  She     was intubated from July 9 to July 10.  She was treated with  nebulized bronchodilators and IV steroids as well as empiric     antibiotics.  She will be discharged on p.o. steroid taper as well     as continue her p.o. antibiotics.  She was unable prior to     admission to follow up with a new appointment with Dr. Delton Coombes in the     pulmonary office.  This appointment has been rescheduled.  The     patient will also follow up with Rubye Oaks, nurse practitioner     in pulmonary office next week. 3. Hypertension, some ongoing problem for this patient, was initially     hypotensive likely secondary to sedation while on the ventilator.     However, her blood pressure has gone back up.  She has been     restarted on her home medications and she has an appointment to     establish care  with Dr. Illene Regulus on July 26.  He should     follow up her hypertension and medication regimen at that time. 4. Diabetes.  The patient has known type 2 diabetes at home.  She is     maintained on metformin, however, due to high dose steroids in     inpatient setting, she had elevated blood sugars.  She was     initially treated with Lantus and sliding scale insulin.  However,     her Lantus has been titrated down and her blood sugar should return     to normal with titration and tapering of steroids.  She will be     discharged on her metformin and again should follow up with Dr.     Debby Bud for further management of diabetes. 5. Purulent bronchitis and questionable sinusitis.  The patient was     initially treated with IV Levaquin.  She is transitioned to p.o.     Levaquin and will complete 5 more days of p.o. antibiotics.  DISCHARGE MEDICATIONS: 1. Albuterol 2.5 mg neb inhaled q.4 h. p.r.n. 2. Levaquin 750 mg p.o. daily x4 more days. 3. Prednisone taper 10 mg tablets.  The patient should take 6 tablets     daily x3 days, then 5 daily x3 days, then 4 daily x3 days, then 3     daily x3 days, then 2 daily x3 days, and 1 daily x3 days and then     stop. 4. Xanax 0.25 mg p.o. b.i.d. p.r.n. for anxiety. 5. Avalide 300/25 p.o. daily. 6. Norvasc 10 mg p.o. daily. 7. Benadryl 25 mg tablets, 2 tablets p.o. at bedtime p.r.n. for     allergies. 8. Fluticasone nasal spray, 2 sprays nasally b.i.d. 9. Iron over-the-counter tablets, 1 tablet p.o. daily. 10.Claritin 1 tablet p.o. b.i.d. 11.Multivitamins 1 tablet p.o. daily. 12.Singulair 10 mg p.o. daily. 13.Potassium 20 mEq p.o. daily. 14.Protonix 40 mg p.o. b.i.d. 15.Saline nasal spray, 1 spray nasally t.i.d. p.r.n. for congestion. 16.Symbicort 160/4.5, 2 puffs inhaled b.i.d.  DISCHARGE ACTIVITY:  The patient may increase activity slowly.  DISCHARGE DIET:  Carb-modified diabetic diet.  DISCHARGE FOLLOWUP: 1. The patient is to follow  up with Rubye Oaks, nurse practitioner     in the [facility] on July 16 at 3:15. 2. Dr. Delton Coombes on August 9 at 3:45. 3. Dr. Debby Bud on July 26 at 10:00 a.m.  DISPOSITION:  The patient has met maximum benefit from her inpatient hospitalization.  She is medically cleared and ready for discharge to home.  She has pulmonary as well as medical outpatient  followup scheduled.  Greater than 30 minutes was spent on this discharge.     Dirk Dress, NP   ______________________________ Oretha Milch, MD    KW/MEDQ  D:  06/10/2011  T:  06/10/2011  Job:  [id number]  cc:   Leslye Peer, MD Rosalyn Gess. Norins, MD  Electronically Signed by Danford Bad N.P. on 06/17/2011 07:58:02 AM Electronically Signed by Cyril Mourning MD on 06/23/2011 09:48:48 PM

## 2011-06-24 ENCOUNTER — Ambulatory Visit: Payer: Medicare Other | Admitting: Internal Medicine

## 2011-07-08 ENCOUNTER — Institutional Professional Consult (permissible substitution): Payer: Medicare Other | Admitting: Emergency Medicine

## 2011-07-21 ENCOUNTER — Ambulatory Visit: Payer: Medicare Other | Admitting: Internal Medicine

## 2011-07-21 DIAGNOSIS — Z029 Encounter for administrative examinations, unspecified: Secondary | ICD-10-CM

## 2011-08-25 LAB — URINALYSIS, ROUTINE W REFLEX MICROSCOPIC
Nitrite: NEGATIVE
Specific Gravity, Urine: 1.031 — ABNORMAL HIGH
Urobilinogen, UA: 1

## 2011-08-25 LAB — COMPREHENSIVE METABOLIC PANEL
Albumin: 3.2 — ABNORMAL LOW
Alkaline Phosphatase: 109
BUN: 7
Chloride: 102
Glucose, Bld: 263 — ABNORMAL HIGH
Potassium: 2.9 — ABNORMAL LOW
Total Bilirubin: 0.5

## 2011-08-25 LAB — DIFFERENTIAL
Eosinophils Relative: 2
Lymphs Abs: 2.5
Monocytes Absolute: 0.1
Neutro Abs: 5.7

## 2011-08-25 LAB — CBC
HCT: 25.8 — ABNORMAL LOW
Hemoglobin: 7.8 — CL
WBC: 8.5

## 2011-08-25 LAB — POCT PREGNANCY, URINE: Operator id: 294591

## 2011-08-26 LAB — DIFFERENTIAL
Blasts: 0
Eosinophils Absolute: 0
Eosinophils Relative: 0
Myelocytes: 0
Neutro Abs: 9.9 — ABNORMAL HIGH
Neutrophils Relative %: 74
Promyelocytes Absolute: 0
nRBC: 0

## 2011-08-26 LAB — CBC
MCV: 58.9 — ABNORMAL LOW
Platelets: 491 — ABNORMAL HIGH
RDW: 21 — ABNORMAL HIGH
WBC: 13.4 — ABNORMAL HIGH

## 2011-08-26 LAB — POCT I-STAT, CHEM 8
BUN: 13
Chloride: 104
Creatinine, Ser: 1
Potassium: 3 — ABNORMAL LOW
Sodium: 139

## 2011-08-26 LAB — POCT CARDIAC MARKERS
CKMB, poc: 1.1
Myoglobin, poc: 35.5
Operator id: 277751
Troponin i, poc: <0.05

## 2011-08-26 LAB — D-DIMER, QUANTITATIVE: D-Dimer, Quant: 0.33

## 2011-08-27 ENCOUNTER — Emergency Department (INDEPENDENT_AMBULATORY_CARE_PROVIDER_SITE_OTHER): Payer: Medicare Other

## 2011-08-27 ENCOUNTER — Emergency Department (HOSPITAL_BASED_OUTPATIENT_CLINIC_OR_DEPARTMENT_OTHER)
Admission: EM | Admit: 2011-08-27 | Discharge: 2011-08-28 | Disposition: A | Discharge status: Other Acute Inpatient Hospital | Payer: Medicare Other | Source: Home / Self Care | Attending: Emergency Medicine | Admitting: Emergency Medicine

## 2011-08-27 ENCOUNTER — Other Ambulatory Visit: Payer: Self-pay

## 2011-08-27 DIAGNOSIS — J45909 Unspecified asthma, uncomplicated: Secondary | ICD-10-CM | POA: Insufficient documentation

## 2011-08-27 DIAGNOSIS — I517 Cardiomegaly: Secondary | ICD-10-CM

## 2011-08-27 DIAGNOSIS — E119 Type 2 diabetes mellitus without complications: Secondary | ICD-10-CM | POA: Insufficient documentation

## 2011-08-27 DIAGNOSIS — K219 Gastro-esophageal reflux disease without esophagitis: Secondary | ICD-10-CM | POA: Insufficient documentation

## 2011-08-27 DIAGNOSIS — I10 Essential (primary) hypertension: Secondary | ICD-10-CM | POA: Insufficient documentation

## 2011-08-27 DIAGNOSIS — Z79899 Other long term (current) drug therapy: Secondary | ICD-10-CM | POA: Insufficient documentation

## 2011-08-27 DIAGNOSIS — R0602 Shortness of breath: Secondary | ICD-10-CM

## 2011-08-27 LAB — BASIC METABOLIC PANEL
CO2: 23 mEq/L (ref 19–32)
Calcium: 8.8 mg/dL (ref 8.4–10.5)
GFR calc non Af Amer: >60 mL/min (ref >60)
Glucose, Bld: 174 mg/dL — ABNORMAL HIGH (ref 70–99)
Potassium: 3 mEq/L — ABNORMAL LOW (ref 3.5–5.1)
Sodium: 139 mEq/L (ref 135–145)

## 2011-08-27 LAB — DIFFERENTIAL
Basophils Absolute: 0 10*3/uL (ref 0.0–0.1)
Eosinophils Absolute: 0.8 10*3/uL — ABNORMAL HIGH (ref 0.0–0.7)
Eosinophils Relative: 13 % — ABNORMAL HIGH (ref 0–5)
Lymphocytes Relative: 41 % (ref 12–46)
Lymphs Abs: 2.6 10*3/uL (ref 0.7–4.0)
Neutrophils Relative %: 36 % — ABNORMAL LOW (ref 43–77)

## 2011-08-27 LAB — CBC
Platelets: 219 10*3/uL (ref 150–400)
RBC: 3.96 MIL/uL (ref 3.87–5.11)
RDW: 17.4 % — ABNORMAL HIGH (ref 11.5–15.5)
WBC: 6.4 10*3/uL (ref 4.0–10.5)

## 2011-08-27 LAB — ETHANOL: Alcohol, Ethyl (B): 23 mg/dL — ABNORMAL HIGH (ref 0–11)

## 2011-08-27 MED ORDER — ALBUTEROL SULFATE (5 MG/ML) 0.5% IN NEBU
INHALATION_SOLUTION | RESPIRATORY_TRACT | Status: AC
  Administered 2011-08-27: 15 mg via RESPIRATORY_TRACT
  Filled 2011-08-27: qty 0.5

## 2011-08-27 MED ORDER — METHYLPREDNISOLONE SODIUM SUCC 125 MG IJ SOLR
125.0000 mg | Freq: Once | INTRAMUSCULAR | Status: AC
  Administered 2011-08-27: 125 mg via INTRAVENOUS
  Filled 2011-08-27: qty 2

## 2011-08-27 MED ORDER — ALBUTEROL SULFATE (5 MG/ML) 0.5% IN NEBU
15.0000 mg | INHALATION_SOLUTION | Freq: Once | RESPIRATORY_TRACT | Status: AC
  Administered 2011-08-27: 15 mg via RESPIRATORY_TRACT

## 2011-08-27 NOTE — ED Notes (Signed)
To ED via ems with sob..The patient has a history of asthma with intubation.  HHn enroute  Per ems. Oriented  Color good skin warm and dry

## 2011-08-28 ENCOUNTER — Inpatient Hospital Stay (HOSPITAL_COMMUNITY): Payer: Medicare Other

## 2011-08-28 ENCOUNTER — Inpatient Hospital Stay (HOSPITAL_COMMUNITY)
Admission: EM | Admit: 2011-08-28 | Discharge: 2011-08-30 | DRG: 190 | Disposition: A | Discharge status: Home / Self Care | Payer: Medicare Other | Source: Other Acute Inpatient Hospital | Attending: Internal Medicine | Admitting: Internal Medicine

## 2011-08-28 ENCOUNTER — Encounter (HOSPITAL_BASED_OUTPATIENT_CLINIC_OR_DEPARTMENT_OTHER): Payer: Self-pay | Admitting: Emergency Medicine

## 2011-08-28 DIAGNOSIS — I10 Essential (primary) hypertension: Secondary | ICD-10-CM | POA: Diagnosis present

## 2011-08-28 DIAGNOSIS — J441 Chronic obstructive pulmonary disease with (acute) exacerbation: Principal | ICD-10-CM | POA: Diagnosis present

## 2011-08-28 DIAGNOSIS — J45901 Unspecified asthma with (acute) exacerbation: Principal | ICD-10-CM | POA: Diagnosis present

## 2011-08-28 DIAGNOSIS — J440 Chronic obstructive pulmonary disease with acute lower respiratory infection: Secondary | ICD-10-CM | POA: Diagnosis present

## 2011-08-28 DIAGNOSIS — F411 Generalized anxiety disorder: Secondary | ICD-10-CM | POA: Diagnosis present

## 2011-08-28 DIAGNOSIS — E119 Type 2 diabetes mellitus without complications: Secondary | ICD-10-CM | POA: Diagnosis present

## 2011-08-28 DIAGNOSIS — J209 Acute bronchitis, unspecified: Secondary | ICD-10-CM | POA: Diagnosis present

## 2011-08-28 DIAGNOSIS — D539 Nutritional anemia, unspecified: Secondary | ICD-10-CM | POA: Diagnosis present

## 2011-08-28 DIAGNOSIS — G4733 Obstructive sleep apnea (adult) (pediatric): Secondary | ICD-10-CM | POA: Diagnosis present

## 2011-08-28 DIAGNOSIS — J9600 Acute respiratory failure, unspecified whether with hypoxia or hypercapnia: Secondary | ICD-10-CM | POA: Diagnosis present

## 2011-08-28 LAB — BASIC METABOLIC PANEL
Chloride: 101 mEq/L (ref 96–112)
GFR calc Af Amer: >60 mL/min (ref >60)
GFR calc non Af Amer: >60 mL/min (ref >60)
Potassium: 2.6 mEq/L — CL (ref 3.5–5.1)
Sodium: 138 mEq/L (ref 135–145)

## 2011-08-28 LAB — URINALYSIS, ROUTINE W REFLEX MICROSCOPIC
Glucose, UA: 250 mg/dL — AB
Ketones, ur: 15 mg/dL — AB
Leukocytes, UA: NEGATIVE
Nitrite: NEGATIVE
Protein, ur: NEGATIVE mg/dL

## 2011-08-28 LAB — BLOOD GAS, ARTERIAL
Drawn by: 28339
O2 Content: 4 L/min
O2 Saturation: 77.1 %
pCO2 arterial: 36 mmHg (ref 35.0–45.0)
pH, Arterial: 7.435 — ABNORMAL HIGH (ref 7.350–7.400)
pO2, Arterial: 42 mmHg — ABNORMAL LOW (ref 80.0–100.0)

## 2011-08-28 LAB — GLUCOSE, CAPILLARY: Glucose-Capillary: 265 mg/dL — ABNORMAL HIGH (ref 70–99)

## 2011-08-28 LAB — CBC
MCHC: 30.8 g/dL (ref 30.0–36.0)
Platelets: 342 10*3/uL (ref 150–400)
RDW: 17.3 % — ABNORMAL HIGH (ref 11.5–15.5)
WBC: 8.2 10*3/uL (ref 4.0–10.5)

## 2011-08-28 LAB — MAGNESIUM: Magnesium: 1.5 mg/dL (ref 1.5–2.5)

## 2011-08-28 LAB — MRSA PCR SCREENING: MRSA by PCR: NEGATIVE

## 2011-08-28 NOTE — H&P (Signed)
NAME:  [name] [name], [name] [name] NO.:  [id number]  MEDICAL RECORD NO.:  [id number]  LOCATION:  3302                         FACILITY:  [facility]  PHYSICIAN:  Gery Pray, MD      DATE OF BIRTH:  [birth year]  DATE OF ADMISSION:  08/28/2011 DATE OF DISCHARGE:                             HISTORY & PHYSICAL   PRIMARY CARE PHYSICIAN:  None.  CODE STATUS:  Full code.    The patient goes to team 6.  CHIEF COMPLAINT:  Shortness of breath.  HISTORY OF PRESENT ILLNESS:  This is a 35 year old female with known history of asthma who states that she has had a sinus drainage for 3 days now.  She also has nasal polyps.  She states that between the two, they have been aggravating her asthma.  She also had a fever up to 102.  She started wheezing 3 days ago.  She used her nebulizer with no effect.  She reports fevers, no chills.  No nausea.  No vomiting.  No diaphoresis.  No burning on urination.  She does report a cough that is productive of greenish sputum.  She decided to come to hospital before it got very bad.  The patient was in the hospital until July 2012.  At that point, she was intubated for exacerbation of asthma.  She has been intubated twice due to asthma exacerbation.     REVIEW OF SYSTEMS:  All 10-point systems reviewed and negative except as noted in HPI.  PAST MEDICAL HISTORY:  Asthma, diabetes mellitus (while on prednisone), nasal polyps, extreme obesity, history of obstructive sleep apnea.  PAST SURGICAL HISTORY:  Tubal ligation.  MEDICATIONS: 1. Norvasc 10 mg daily. 2. Avalide 300/25 mg p.o. daily. 3. Xanax p.r.n. 4. Fluticasone spray. 5. Benadryl p.r.n. 6. Multivitamins. 7. Claritin. 8. Iron tablets. 9. Singulair. 10.Potassium 20 mEq daily. 11.Protonix nasal spray p.r.n. 12.Symbicort.  ALLERGIES:  No known drug allergies.  SOCIAL HISTORY:  Negative for tobacco, alcohol, or illicit drugs.  The patient lives in [place].  She is visiting her  sister.  She uses nebulizer.  She is not on home oxygen.  FAMILY HISTORY:  Significant for coronary artery disease and diabetes mellitus.  PHYSICAL EXAMINATION:  VITAL SIGNS:  Blood pressure 179/104, respirations 22, pulse 136, temperature 99.6, 100% on 2 L nasal cannula. GENERAL:  Well-developed, well-nourished appearing female. EYES:  Pink conjunctivae.  PERRLA. ENT:  Moist oral mucosa.  Trachea midline. NECK:  Supple. LUNGS:  Positive wheeze.  Mild use of accessory muscles. CARDIOVASCULAR:  Regular rate and rhythm without murmurs, regurg, or gallops. ABDOMEN:  Unable to assess organomegaly due to the patient's body habitus. NEURO:  Cranial nerves II through XII grossly intact.  Sensation intact. MUSCULOSKELETAL:  Strength 5/5 in all extremities.  No clubbing, cyanosis, or edema. SKIN: no rash, no subcutaneous crepitations  LABORATORY DATA:  Chest x-ray shows mild congestive changes and heart improved since previous studies.  No edema.  White blood count 6.4, hemoglobin 8.7, platelets 219.  Sodium 139, potassium 3.0, chloride 102, CO2 of 23, glucose 174, creatinine 0.8.  UA is negative.  ASSESSMENT AND PLAN: 1. Acute exacerbation of Asthma. 2. Morbid obesity. 3. Obstructive sleep apnea.  The patient will be admitted.  We will     continue Solu-Medrol IV, nebulizer treatments with Xopenex and     albuterol.  The patient is quite tachycardic due to albuterol therapy.     She did receive a one hour continuous neb treatment prior to her being     transferred here from [facility].  The patient will continue oxygen     via nasal cannula. 4. Diabetes mellitus.  (When the patient is on steroids). Will order     fingerstick blood sugars and sliding scale and monitor. 5. Hypertension, nasal polyps.  Continue home medications.  Claritin     will be added as well as Xanax for anxiety.          ______________________________ Gery Pray, MD     DC/MEDQ  D:  08/28/2011  T:   08/28/2011  Job:  [id number]  Electronically Signed by Gery Pray MD on 08/28/2011 06:23:41 AM

## 2011-08-28 NOTE — ED Notes (Signed)
Pt off of bed(side rails up),has taken off mask,pulse ox.and is sitting in chair talking on phone

## 2011-08-28 NOTE — ED Notes (Signed)
Up to BSC; tolerated well.

## 2011-08-28 NOTE — ED Provider Notes (Addendum)
History     CSN: [id number] Arrival date & time: 08/27/2011 10:06 PM  Chief Complaint  Patient presents with  . Asthma    (Consider location/radiation/quality/duration/timing/severity/associated sxs/prior treatment) HPI Comments: Pt with hx of asthma/bronchitis presents with worsening shortness of breath over last 2 days, coughing up some yellow sputum, has nasal congestion, sinus congestion.  No fevers.  Using nebs at home without relief  Patient is a 35 y.o. female presenting with shortness of breath.  Shortness of Breath  The current episode started yesterday. The onset was gradual. The problem occurs continuously. The problem has been gradually worsening. The problem is severe. The symptoms are relieved by nothing. The symptoms are aggravated by a supine position and activity. Associated symptoms include chest pain, rhinorrhea, cough, shortness of breath and wheezing. Pertinent negatives include no chest pressure, no fever and no sore throat. She has had intermittent steroid use. She has had prior hospitalizations. She has had prior ICU admissions. She has had prior intubations (in July). Her past medical history is significant for asthma. There were no sick contacts.    Past Medical History  Diagnosis Date  . Asthma   . Type 2 diabetes mellitus   . Hypertension   . Chronic anemia   . Abnormal TSH   . Morbid obesity   . GERD (gastroesophageal reflux disease)     History reviewed. No pertinent past surgical history.  No family history on file.  History  Substance Use Topics  . Smoking status: Not on file  . Smokeless tobacco: Not on file  . Alcohol Use: Yes    OB History    Grav Para Term Preterm Abortions TAB SAB Ect Mult Living                  Review of Systems  Constitutional: Positive for fatigue. Negative for fever, chills and diaphoresis.  HENT: Positive for rhinorrhea. Negative for congestion, sore throat and sneezing.   Eyes: Negative.   Respiratory:  Positive for cough, shortness of breath and wheezing. Negative for chest tightness.   Cardiovascular: Positive for chest pain. Negative for leg swelling.  Gastrointestinal: Negative for nausea, vomiting, abdominal pain, diarrhea and blood in stool.  Genitourinary: Negative for frequency, hematuria, flank pain and difficulty urinating.  Musculoskeletal: Negative for back pain and arthralgias.  Skin: Negative for rash.  Neurological: Negative for dizziness, speech difficulty, weakness, numbness and headaches.    Allergies  Aspirin free childs; Ibuprofen; and Shellfish allergy  Home Medications   Current Outpatient Rx  Name Route Sig Dispense Refill  . ALBUTEROL SULFATE (5 MG/ML) 0.5% IN NEBU Nebulization Take 2.5 mg by nebulization once.      . ALPRAZOLAM 0.25 MG PO TABS Oral Take 0.25 mg by mouth 2 (two) times daily.     . BUDESONIDE-FORMOTEROL FUMARATE 160-4.5 MCG/ACT IN AERO Inhalation Inhale 2 puffs into the lungs 2 (two) times daily.      Marland Kitchen DIPHENHYDRAMINE HCL 50 MG PO TABS Oral Take 50 mg by mouth at bedtime as needed. For allergies     . FLUTICASONE PROPIONATE 50 MCG/ACT NA SUSP Nasal Place 2 sprays into the nose daily.      Marland Kitchen FOLIC ACID 400 MCG PO TABS Oral Take 400 mcg by mouth daily.      . IPRATROPIUM BROMIDE 0.02 % IN SOLN Nebulization Take 500 mcg by nebulization once.      . IRBESARTAN-HYDROCHLOROTHIAZIDE 300-25 MG PO TABS Oral Take 1 tablet by mouth daily.      Marland Kitchen  IRON PO Oral Take 1 tablet by mouth daily.      Marland Kitchen METFORMIN HCL 1000 MG PO TABS Oral Take 1,000 mg by mouth 2 (two) times daily with a meal.      . MULTIVITAMINS PO CAPS Oral Take 1 capsule by mouth daily.      Marland Kitchen PANTOPRAZOLE SODIUM 40 MG PO TBEC Oral Take 40 mg by mouth daily.     Marland Kitchen POTASSIUM CHLORIDE 20 MEQ PO PACK Oral Take 20 mEq by mouth daily.      Marland Kitchen SALINE NASAL SPRAY NA Nasal Place 1 spray into the nose 3 (three) times daily as needed.      Marland Kitchen TIOTROPIUM BROMIDE MONOHYDRATE 18 MCG IN CAPS Inhalation Place  18 mcg into inhaler and inhale daily.     Marland Kitchen AMLODIPINE BESYLATE 10 MG PO TABS Oral Take 10 mg by mouth daily.      . GUAIFENESIN 600 MG PO TB12 Oral Take 1,200 mg by mouth 2 (two) times daily.      . IPRATROPIUM-ALBUTEROL 0.5-2.5 (3) MG/3ML IN SOLN Nebulization Take 3 mLs by nebulization every 6 (six) hours as needed.      Marland Kitchen LORATADINE 10 MG PO TABS Oral Take 10 mg by mouth daily.      Marland Kitchen MONTELUKAST SODIUM 10 MG PO TABS Oral Take 10 mg by mouth at bedtime.      Marland Kitchen PREDNISONE 10 MG PO TABS  Taper as directed       BP 173/71  Pulse 132  Temp(Src) 97.5 F (36.4 C) (Oral)  Resp 22  SpO2 93%  Physical Exam  Constitutional: She is oriented to person, place, and time. She appears well-developed and well-nourished.  HENT:  Head: Normocephalic and atraumatic.  Eyes: Pupils are equal, round, and reactive to light.  Neck: Normal range of motion. Neck supple.  Cardiovascular: Regular rhythm and normal heart sounds.  Tachycardia present.   Pulmonary/Chest: She is in respiratory distress. She has wheezes. She has no rales. She exhibits no tenderness.       Talking in short sentences, increased accessory muscle use  Abdominal: Soft. Bowel sounds are normal. There is no tenderness. There is no rebound and no guarding.  Musculoskeletal: Normal range of motion. She exhibits no edema.  Lymphadenopathy:    She has no cervical adenopathy.  Neurological: She is alert and oriented to person, place, and time.  Skin: Skin is warm and dry. No rash noted.  Psychiatric: She has a normal mood and affect.    ED Course  CRITICAL CARE Performed by: Adelheid Hoggard Authorized by: Rolan Bucco Total critical care time: 45 minutes Critical care time was exclusive of separately billable procedures and treating other patients. Critical care was necessary to treat or prevent imminent or life-threatening deterioration of the following conditions: respiratory failure. Critical care was time spent personally by me  on the following activities: development of treatment plan with patient or surrogate, discussions with consultants, evaluation of patient's response to treatment, examination of patient, obtaining history from patient or surrogate, ordering and performing treatments and interventions, ordering and review of laboratory studies, ordering and review of radiographic studies, pulse oximetry, re-evaluation of patient's condition and review of old charts.   (including critical care time)  Labs Reviewed  CBC - Abnormal; Notable for the following:    Hemoglobin 8.7 (*)    HCT 27.9 (*)    MCV 70.5 (*)    MCH 22.0 (*)    RDW 17.4 (*)  All other components within normal limits  DIFFERENTIAL - Abnormal; Notable for the following:    Neutrophils Relative 36 (*)    Eosinophils Relative 13 (*)    Eosinophils Absolute 0.8 (*)    All other components within normal limits  BASIC METABOLIC PANEL - Abnormal; Notable for the following:    Potassium 3.0 (*) HEMOLYZED SPECIMEN, RESULTS MAY BE AFFECTED   Glucose, Bld 174 (*)    BUN 5 (*)    All other components within normal limits  ETHANOL - Abnormal; Notable for the following:    Alcohol, Ethyl (B) 23 (*)    All other components within normal limits  URINALYSIS, ROUTINE W REFLEX MICROSCOPIC - Abnormal; Notable for the following:    Glucose, UA 250 (*)    Ketones, ur 15 (*)    All other components within normal limits   Dg Chest Portable 1 View  08/27/2011  *RADIOLOGY REPORT*  Clinical Data: Shortness of breath.  History of asthma.  PORTABLE CHEST - 1 VIEW  Comparison: 06/08/2011  Findings: Mild cardiac enlargement with increased pulmonary vascularity.  Changes suggest congestion.  Mild improvement since the previous study.  No evidence of developing edema.  No blunting of costophrenic angles.  No focal airspace consolidation.  No pneumothorax.  IMPRESSION: Mild congestive changes in the heart and lungs, improved since previous study.  No edema.  Original  Report Authenticated By: Marlon Pel, M.D.     1. Asthma       MDM  Respiratory failure vs pneumonia vs CHF      Results for orders placed during the hospital encounter of 08/27/11  CBC      Component Value Range   WBC 6.4  4.0 - 10.5 (K/uL)   RBC 3.96  3.87 - 5.11 (MIL/uL)   Hemoglobin 8.7 (*) 12.0 - 15.0 (g/dL)   HCT 16.1 (*) 09.6 - 46.0 (%)   MCV 70.5 (*) 78.0 - 100.0 (fL)   MCH 22.0 (*) 26.0 - 34.0 (pg)   MCHC 31.2  30.0 - 36.0 (g/dL)   RDW 04.5 (*) 40.9 - 15.5 (%)   Platelets 219  150 - 400 (K/uL)  DIFFERENTIAL      Component Value Range   Neutrophils Relative 36 (*) 43 - 77 (%)   Neutro Abs 2.3  1.7 - 7.7 (K/uL)   Lymphocytes Relative 41  12 - 46 (%)   Lymphs Abs 2.6  0.7 - 4.0 (K/uL)   Monocytes Relative 11  3 - 12 (%)   Monocytes Absolute 0.7  0.1 - 1.0 (K/uL)   Eosinophils Relative 13 (*) 0 - 5 (%)   Eosinophils Absolute 0.8 (*) 0.0 - 0.7 (K/uL)   Basophils Relative 0  0 - 1 (%)   Basophils Absolute 0.0  0.0 - 0.1 (K/uL)  BASIC METABOLIC PANEL      Component Value Range   Sodium 139  135 - 145 (mEq/L)   Potassium 3.0 (*) 3.5 - 5.1 (mEq/L)   Chloride 102  96 - 112 (mEq/L)   CO2 23  19 - 32 (mEq/L)   Glucose, Bld 174 (*) 70 - 99 (mg/dL)   BUN 5 (*) 6 - 23 (mg/dL)   Creatinine, Ser 8.11  0.50 - 1.10 (mg/dL)   Calcium 8.8  8.4 - 91.4 (mg/dL)   GFR calc non Af Amer >60  >60 (mL/min)   GFR calc Af Amer >60  >60 (mL/min)  ETHANOL      Component Value Range  Alcohol, Ethyl (B) 23 (*) 0 - 11 (mg/dL)  URINALYSIS, ROUTINE W REFLEX MICROSCOPIC      Component Value Range   Color, Urine YELLOW  YELLOW    Appearance CLEAR  CLEAR    Specific Gravity, Urine 1.018  1.005 - 1.030    pH 6.0  5.0 - 8.0    Glucose, UA 250 (*) NEGATIVE (mg/dL)   Hgb urine dipstick NEGATIVE  NEGATIVE    Bilirubin Urine NEGATIVE  NEGATIVE    Ketones, ur 15 (*) NEGATIVE (mg/dL)   Protein, ur NEGATIVE  NEGATIVE (mg/dL)   Urobilinogen, UA 1.0  0.0 - 1.0 (mg/dL)   Nitrite  NEGATIVE  NEGATIVE    Leukocytes, UA NEGATIVE  NEGATIVE    Dg Chest Portable 1 View  08/27/2011  *RADIOLOGY REPORT*  Clinical Data: Shortness of breath.  History of asthma.  PORTABLE CHEST - 1 VIEW  Comparison: 06/08/2011  Findings: Mild cardiac enlargement with increased pulmonary vascularity.  Changes suggest congestion.  Mild improvement since the previous study.  No evidence of developing edema.  No blunting of costophrenic angles.  No focal airspace consolidation.  No pneumothorax.  IMPRESSION: Mild congestive changes in the heart and lungs, improved since previous study.  No edema.  Original Report Authenticated By: Marlon Pel, M.D.    Date: 08/28/2011  Rate: 129   Rhythm: sinus tachycardia  QRS Axis: normal  Intervals: normal  ST/T Wave abnormalities: nonspecific ST/T changes  Conduction Disutrbances:none  Narrative Interpretation:   Old EKG Reviewed: unchanged    Pt given steroids, continuous nebs.  Doing better, talking better, but still with diffuse wheezing, sats 96% with nebs.  No evidence of pneumonia.  Will consult hospitalist for admission.  Reviewed records, pt has had anemia in past from 9-11 hgb.  Lower today, pt denies any heavy vag bleeding, blood in stool, melena  Rolan Bucco, MD 08/28/11 0041  Rolan Bucco, MD 08/28/11 [id number]  Rolan Bucco, MD 08/28/11 [id number]

## 2011-08-28 NOTE — Progress Notes (Signed)
Entered pt room to assess respiratory vitalsand pt talking on phone without aerosol mask. Pt stated she was not happy with me because she was on phone and I was bothering her. I explained to pt that my concern was for her physical well being and I apologized that I upset her. I again asked pt if I could place aerosol mask back on her face and she explained that she would do it when she wanted. I left the pt room and notified RN. At this time [facility] arrived and I explained this information to them.

## 2011-08-29 LAB — CBC
MCH: 21.7 pg — ABNORMAL LOW (ref 26.0–34.0)
MCHC: 30.8 g/dL (ref 30.0–36.0)
MCV: 70.4 fL — ABNORMAL LOW (ref 78.0–100.0)
Platelets: 443 10*3/uL — ABNORMAL HIGH (ref 150–400)
RDW: 17.5 % — ABNORMAL HIGH (ref 11.5–15.5)

## 2011-08-29 LAB — FERRITIN: Ferritin: 5 ng/mL — ABNORMAL LOW (ref 10–291)

## 2011-08-29 LAB — MAGNESIUM: Magnesium: 2.2 mg/dL (ref 1.5–2.5)

## 2011-08-29 LAB — GLUCOSE, CAPILLARY: Glucose-Capillary: 252 mg/dL — ABNORMAL HIGH (ref 70–99)

## 2011-08-29 LAB — VITAMIN B12: Vitamin B-12: 389 pg/mL (ref 211–911)

## 2011-08-29 LAB — BASIC METABOLIC PANEL
Calcium: 9.7 mg/dL (ref 8.4–10.5)
Creatinine, Ser: 0.71 mg/dL (ref 0.50–1.10)
GFR calc Af Amer: >60 mL/min (ref >60)
GFR calc non Af Amer: >60 mL/min (ref >60)

## 2011-08-29 LAB — IRON AND TIBC
Iron: 14 ug/dL — ABNORMAL LOW (ref 42–135)
UIBC: 439 ug/dL — ABNORMAL HIGH (ref 125–400)

## 2011-08-29 LAB — FOLATE: Folate: 12.9 ng/mL

## 2011-08-30 LAB — GLUCOSE, CAPILLARY
Glucose-Capillary: 348 mg/dL — ABNORMAL HIGH (ref 70–99)
Glucose-Capillary: 223 mg/dL — ABNORMAL HIGH (ref 70–99)

## 2011-08-30 LAB — BASIC METABOLIC PANEL
CO2: 25 mEq/L (ref 19–32)
Chloride: 102 mEq/L (ref 96–112)
Creatinine, Ser: 0.77 mg/dL (ref 0.50–1.10)

## 2011-08-30 MED FILL — Ipratropium-Albuterol Nebu Soln 0.5-2.5(3) MG/3ML: RESPIRATORY_TRACT | Qty: 3 | Status: AC

## 2011-09-07 NOTE — Discharge Summary (Signed)
NAME:  [name] [name], NEARY NO.:  [id number]  MEDICAL RECORD NO.:  [id number]  LOCATION:  4709                         FACILITY:  [facility]  PHYSICIAN:  Jeoffrey Massed, MD    DATE OF BIRTH:  1976-07-07  DATE OF ADMISSION:  08/28/2011 DATE OF DISCHARGE:  08/30/2011                        DISCHARGE SUMMARY - REFERRING   PRIMARY CARE PRACTITIONER:  [place].  PRIMARY DISCHARGE DIAGNOSES: 1. Acute hypoxic respiratory failure, now resolved. 2. Acute exacerbation of chronic obstructive pulmonary     disease/bronchial asthma. 3. Acute bronchitis. 4. Diet-controlled diabetes.  SECONDARY DISCHARGE DIAGNOSES/ PAST MEDICAL HISTORY: 1. Morbid obesity. 2. Bronchial asthma. 3. Diet-controlled diabetes. 4. Hypertension. 5. Obstructive sleep apnea. 6. Anxiety. 7. Gastroesophageal reflux disease. 8. Microcytic anemia.  Further workup to be done as an outpatient.  CONSULTANTS:  On the case done, none.  BRIEF HISTORY OF PRESENT ILLNESS:  The patient is a 35 year old female with the above-noted medical issues presented to the ED on August 28, 2011, complaining of shortness of breath.  She was then admitted to the hospitalist service for further evaluation and treatment.  For further details, please see the history and physical that was dictated by Dr. Gery Pray on admission.  PERTINENT RADIOLOGICAL STUDIES:  Portable chest second on August 27, 2011, showed mild congestive changes in the heart and lungs, improved since previous study.  No edema.  Portable chest x-ray done on August 28, 2011, showed no acute cardiopulmonary abnormality.  PERTINENT LABORATORY DATA: 1. Chemistries on discharge shows a sodium of 137, potassium of 3.6,     chloride of 102, bicarb of 25, glucose of 336, BUN of 15,     creatinine of 0.77, and a calcium of 9.3. 2. HbA1c is 6.5.  BRIEF HOSPITAL COURSE: 1. Acute respiratory failure.  This was secondary to acute     exacerbation of  COPD/bronchial asthma.  The patient was initially     monitored in the Stepdown Unit.  She was empirically started on IV     steroids, nebulizers, and placed on empiric Levaquin.  With this,     the patient made significant improvement.  Because of her     occasional tachycardia, she was actually placed on Xopenex.  During     my rounds today, the patient is requesting that she be discharged     home and indicates to me that she is going to leave anyway.  She     claims that her shortness of breath is significantly better and she     feels that she is in fact very close to her baseline.  I did     examine the patient and on exam, she appears very comfortable with     no respiratory distress and her lungs are actually clear with     occasional scattered rhonchi.  I did ambulate her in the hallway     and she did not develop overt shortness of breath.  However, heart     rate did transiently go up to the 130s range before coming back     down to the low 100s range.  After seeing her heart rate elevate,I     did ask this patient  to stay one more day and in fact told her that     this was my recommendations; however, she emphatically wants to go     home today and indicates to me that she will leave either way.  She     does apparently have a history of needing mechanical ventilation in     one of these episodes of respiratory failure.  She does know the     risk of perhaps further deterioration in the respiratory status     causing respiratory failure, respiratory arrest, and possible death,     she is willing to accept these risks and is requesting that she be     discharged home today.  She will be discharged on the medications     that are noted below.  I have asked this patient to return to the     ED in case she does develop worsening shortness of breath or starts     having palpitations that are persistent.  The patient claims that     she has a 59 year old daughter and 2 other sons  that they live with     her and she will not be alone and they can monitor her. 2. Hypertension.  This is stable.  She is to resume her usual     medications. 3. Diabetes.  The patient claims that she only takes metformin while     on steroids.  Her HbA1c is only 6.5 and I have asked her to     continue taking metformin as previous. 4. Rest of her medical issues are stable.  DISPOSITION:  The patient is being discharged home at her own request.  FOLLOWUP INSTRUCTIONS: 1. Patient is in [place] only temporarily and indicates that she     will be leaving to [place] this coming Friday and she will see     her primary care doctor there.  When I asked the name of the     primary care doctor, she does not recollect at this point in time.     She is to follow with her primary care doctor in the next 1 week. 2. She will need an outpatient workup for microcytic anemia.  All of     this has been explained to the patient and she claims     understanding.  Total time spent for discharge equals 45 minutes.     Jeoffrey Massed, MD     SG/MEDQ  D:  08/30/2011  T:  08/30/2011  Job:  [id number]  Electronically Signed by Jeoffrey Massed  on 09/07/2011 03:44:34 PM

## 2011-10-29 ENCOUNTER — Inpatient Hospital Stay (HOSPITAL_COMMUNITY)
Admission: EM | Admit: 2011-10-29 | Discharge: 2011-11-01 | DRG: 203 | Disposition: A | Discharge status: Home / Self Care | Payer: Medicare Other | Attending: Internal Medicine | Admitting: Internal Medicine

## 2011-10-29 ENCOUNTER — Emergency Department (HOSPITAL_COMMUNITY): Payer: Medicare Other

## 2011-10-29 ENCOUNTER — Encounter (HOSPITAL_COMMUNITY): Payer: Self-pay | Admitting: Emergency Medicine

## 2011-10-29 DIAGNOSIS — Z23 Encounter for immunization: Secondary | ICD-10-CM

## 2011-10-29 DIAGNOSIS — Z87892 Personal history of anaphylaxis: Secondary | ICD-10-CM

## 2011-10-29 DIAGNOSIS — I10 Essential (primary) hypertension: Secondary | ICD-10-CM | POA: Diagnosis present

## 2011-10-29 DIAGNOSIS — Z888 Allergy status to other drugs, medicaments and biological substances status: Secondary | ICD-10-CM

## 2011-10-29 DIAGNOSIS — R9431 Abnormal electrocardiogram [ECG] [EKG]: Secondary | ICD-10-CM | POA: Diagnosis present

## 2011-10-29 DIAGNOSIS — Z884 Allergy status to anesthetic agent status: Secondary | ICD-10-CM

## 2011-10-29 DIAGNOSIS — IMO0002 Reserved for concepts with insufficient information to code with codable children: Secondary | ICD-10-CM

## 2011-10-29 DIAGNOSIS — J45901 Unspecified asthma with (acute) exacerbation: Principal | ICD-10-CM | POA: Diagnosis present

## 2011-10-29 DIAGNOSIS — Z79899 Other long term (current) drug therapy: Secondary | ICD-10-CM

## 2011-10-29 DIAGNOSIS — R000 Tachycardia, unspecified: Secondary | ICD-10-CM | POA: Diagnosis present

## 2011-10-29 DIAGNOSIS — E876 Hypokalemia: Secondary | ICD-10-CM | POA: Diagnosis present

## 2011-10-29 DIAGNOSIS — K219 Gastro-esophageal reflux disease without esophagitis: Secondary | ICD-10-CM | POA: Diagnosis present

## 2011-10-29 DIAGNOSIS — D649 Anemia, unspecified: Secondary | ICD-10-CM | POA: Diagnosis present

## 2011-10-29 DIAGNOSIS — E119 Type 2 diabetes mellitus without complications: Secondary | ICD-10-CM | POA: Diagnosis present

## 2011-10-29 DIAGNOSIS — Z9119 Patient's noncompliance with other medical treatment and regimen: Secondary | ICD-10-CM

## 2011-10-29 DIAGNOSIS — Z91013 Allergy to seafood: Secondary | ICD-10-CM

## 2011-10-29 DIAGNOSIS — Z91199 Patient's noncompliance with other medical treatment and regimen due to unspecified reason: Secondary | ICD-10-CM

## 2011-10-29 DIAGNOSIS — J40 Bronchitis, not specified as acute or chronic: Secondary | ICD-10-CM | POA: Diagnosis present

## 2011-10-29 HISTORY — DX: Other seasonal allergic rhinitis: J30.2

## 2011-10-29 LAB — BASIC METABOLIC PANEL
BUN: 7 mg/dL (ref 6–23)
CO2: 23 mEq/L (ref 19–32)
Calcium: 8.8 mg/dL (ref 8.4–10.5)
Chloride: 102 mEq/L (ref 96–112)
GFR calc non Af Amer: >90 mL/min (ref >90)
Glucose, Bld: 262 mg/dL — ABNORMAL HIGH (ref 70–99)
Potassium: 3.3 mEq/L — ABNORMAL LOW (ref 3.5–5.1)
Sodium: 139 mEq/L (ref 135–145)
Sodium: 137 mEq/L (ref 135–145)

## 2011-10-29 LAB — DIFFERENTIAL
Basophils Absolute: 0 10*3/uL (ref 0.0–0.1)
Basophils Relative: 0 % (ref 0–1)
Eosinophils Absolute: 1.2 10*3/uL — ABNORMAL HIGH (ref 0.0–0.7)
Lymphocytes Relative: 20 % (ref 12–46)
Neutrophils Relative %: 59 % (ref 43–77)
Smear Review: ADEQUATE

## 2011-10-29 LAB — CBC
Hemoglobin: 8.5 g/dL — ABNORMAL LOW (ref 12.0–15.0)
MCHC: 29.2 g/dL — ABNORMAL LOW (ref 30.0–36.0)
RDW: 18.6 % — ABNORMAL HIGH (ref 11.5–15.5)

## 2011-10-29 MED ORDER — ALBUTEROL SULFATE (5 MG/ML) 0.5% IN NEBU
INHALATION_SOLUTION | RESPIRATORY_TRACT | Status: AC
  Filled 2011-10-29: qty 1

## 2011-10-29 MED ORDER — METHYLPREDNISOLONE SODIUM SUCC 125 MG IJ SOLR: 125.0000 mg | INTRAMUSCULAR | Status: DC

## 2011-10-29 MED ORDER — ALBUTEROL SULFATE (5 MG/ML) 0.5% IN NEBU
5.0000 mg | INHALATION_SOLUTION | Freq: Once | RESPIRATORY_TRACT | Status: AC
  Administered 2011-10-29: 5 mg via RESPIRATORY_TRACT
  Filled 2011-10-29: qty 1

## 2011-10-29 MED ORDER — MAGNESIUM SULFATE 40 MG/ML IJ SOLN
2.0000 g | INTRAMUSCULAR | Status: DC
  Filled 2011-10-29: qty 50

## 2011-10-29 MED ORDER — METHYLPREDNISOLONE SODIUM SUCC 125 MG IJ SOLR
INTRAMUSCULAR | Status: AC
  Administered 2011-10-29: 08:00:00
  Filled 2011-10-29: qty 2

## 2011-10-29 MED ORDER — IPRATROPIUM BROMIDE 0.02 % IN SOLN
0.5000 mg | RESPIRATORY_TRACT | Status: AC
  Administered 2011-10-29: 0.5 mg via RESPIRATORY_TRACT
  Filled 2011-10-29: qty 2.5

## 2011-10-29 MED ORDER — HYDROCOD POLST-CHLORPHEN POLST 10-8 MG/5ML PO LQCR
5.0000 mL | Freq: Once | ORAL | Status: AC
  Administered 2011-10-29: 5 mL via ORAL
  Filled 2011-10-29: qty 5

## 2011-10-29 MED ORDER — IPRATROPIUM BROMIDE 0.02 % IN SOLN
RESPIRATORY_TRACT | Status: AC
  Administered 2011-10-29: 08:00:00
  Filled 2011-10-29: qty 2.5

## 2011-10-29 MED ORDER — GI COCKTAIL ~~LOC~~: 30.0000 mL | Freq: Once | ORAL | Status: DC

## 2011-10-29 MED ORDER — OXYCODONE HCL 5 MG PO TABS
5.0000 mg | ORAL_TABLET | ORAL | Status: DC | PRN
  Administered 2011-10-29: 5 mg via ORAL
  Filled 2011-10-29: qty 1

## 2011-10-29 MED ORDER — IPRATROPIUM BROMIDE 0.02 % IN SOLN
0.5000 mg | RESPIRATORY_TRACT | Status: DC
  Filled 2011-10-29 (×2): qty 2.5

## 2011-10-29 MED ORDER — ALBUTEROL (5 MG/ML) CONTINUOUS INHALATION SOLN
10.0000 mg/h | INHALATION_SOLUTION | RESPIRATORY_TRACT | Status: AC
  Administered 2011-10-29: 10 mg/h via RESPIRATORY_TRACT
  Filled 2011-10-29: qty 20

## 2011-10-29 MED ORDER — SODIUM CHLORIDE 0.9 % IV BOLUS (SEPSIS): 1000.0000 mL | Freq: Once | INTRAVENOUS | Status: DC

## 2011-10-29 MED ORDER — ALBUTEROL SULFATE (5 MG/ML) 0.5% IN NEBU
5.0000 mg | INHALATION_SOLUTION | Freq: Once | RESPIRATORY_TRACT | Status: DC
  Filled 2011-10-29: qty 2

## 2011-10-29 MED ORDER — MAGNESIUM SULFATE 50 % IJ SOLN
1.0000 g | INTRAMUSCULAR | Status: AC
  Administered 2011-10-29 (×2): 1 g via INTRAMUSCULAR
  Filled 2011-10-29 (×4): qty 2

## 2011-10-29 MED ORDER — IPRATROPIUM BROMIDE 0.02 % IN SOLN
0.5000 mg | Freq: Once | RESPIRATORY_TRACT | Status: AC
  Administered 2011-10-29: 0.5 mg via RESPIRATORY_TRACT

## 2011-10-29 MED ORDER — POTASSIUM CHLORIDE 10 MEQ/100ML IV SOLN: 10.0000 meq | Freq: Once | INTRAVENOUS | Status: DC

## 2011-10-29 MED ORDER — GI COCKTAIL ~~LOC~~
30.0000 mL | Freq: Once | ORAL | Status: AC
  Administered 2011-10-29: 30 mL via ORAL
  Filled 2011-10-29: qty 30

## 2011-10-29 MED ORDER — ALBUTEROL SULFATE (5 MG/ML) 0.5% IN NEBU
5.0000 mg | INHALATION_SOLUTION | Freq: Once | RESPIRATORY_TRACT | Status: AC
  Administered 2011-10-29: 08:00:00 via RESPIRATORY_TRACT
  Administered 2011-10-29: 5 mg via RESPIRATORY_TRACT
  Filled 2011-10-29: qty 1

## 2011-10-29 MED ORDER — POTASSIUM CHLORIDE 20 MEQ/15ML (10%) PO LIQD
40.0000 meq | Freq: Once | ORAL | Status: AC
  Administered 2011-10-29: 40 meq via ORAL
  Filled 2011-10-29: qty 30

## 2011-10-29 MED ORDER — IPRATROPIUM BROMIDE 0.02 % IN SOLN
0.5000 mg | Freq: Once | RESPIRATORY_TRACT | Status: AC
  Administered 2011-10-29: 0.5 mg via RESPIRATORY_TRACT
  Filled 2011-10-29: qty 2.5

## 2011-10-29 MED ORDER — MAGNESIUM SULFATE 50 % IJ SOLN: 2.0000 g | Freq: Once | INTRAMUSCULAR | Status: DC

## 2011-10-29 MED ORDER — ALBUTEROL SULFATE (5 MG/ML) 0.5% IN NEBU: 2.5000 mg | INHALATION_SOLUTION | Freq: Once | RESPIRATORY_TRACT | Status: DC

## 2011-10-29 MED ORDER — ALBUTEROL (5 MG/ML) CONTINUOUS INHALATION SOLN
10.0000 mg | INHALATION_SOLUTION | Freq: Once | RESPIRATORY_TRACT | Status: AC
  Administered 2011-10-29: 10 mg via RESPIRATORY_TRACT

## 2011-10-29 NOTE — ED Provider Notes (Signed)
Patient relates she's had asthma for about 10 years. She relates she was intubated in June. She relates for the past 2 days she's had some worsening shortness of breath and this morning she did not improve and she did her nebulizer treatments. She states she has productive cough with green and now yellow sputum. She feels she's had fever but has not checked her temperature. She also describes a lot of nasal congestion. Patient examined after respiratory nebulizers and she still has diffuse expiratory wheezing she has some minor retractions and some dyspnea on talking.. We have discussed putting her in the CDU under asthma protocol and patient is agreeable.  Medical screening examination/treatment/procedure(s) were conducted as a shared visit with non-physician practitioner(s) and myself.  I personally evaluated the patient during the encounter Devoria Albe, MD, Franz Dell, MD 10/29/11 1014

## 2011-10-29 NOTE — ED Provider Notes (Signed)
5:41 PM Patient care resumed from [facility].  Patient able to speak in full sentences, no stridor or audible wheezing.  Alert and oriented x3.  Patient not in respiratory distress  Patient reevaluated and states that she feels much better.  Recheck of vitals ordered as well as to ambulate the patient and assess her O2 sats.  7:30 PM Patient was unable to keep her oxygen saturation above 90 after ambulation.  In addition she is still tachycardic and or wheezing.  Hospitalists were paged to admit the patient.  She will be admitted for hypoxia and tachycardia, Dr. Ashley Royalty [facility] team 7, step down bed.  [place], [place] 10/29/11 [id number]

## 2011-10-29 NOTE — ED Notes (Signed)
Pt requesting pain meds. Pa made aware. States oxycodone given earlier did not work

## 2011-10-29 NOTE — ED Notes (Signed)
Dr Knapp at pt bedside 

## 2011-10-29 NOTE — ED Notes (Signed)
K+ 2.6 relayed by lab tech, Rondel Oh.  EDP aware.

## 2011-10-29 NOTE — ED Notes (Signed)
RT at bedside.  Pt continues to wait for IV meds as no IV established yet.

## 2011-10-29 NOTE — ED Notes (Signed)
While walking O2 stats were between  88 and 95.

## 2011-10-29 NOTE — ED Notes (Signed)
Per Genelle Gather, PA - will attempt IV under ultrasound prior to pt going to CDU.

## 2011-10-29 NOTE — ED Notes (Signed)
IV team notified of need for IV/ blood draw - due to pt is difficult stick.  Genelle Gather, PA at pt bedside at this time.

## 2011-10-29 NOTE — ED Notes (Signed)
Pt eating dinner. Will ambulate with pulse ox when done

## 2011-10-29 NOTE — ED Notes (Signed)
Genelle Gather, PA notified of IV start unsuccessful.  [place] machine at bedside per her request - states she will try an IV under [place].

## 2011-10-29 NOTE — ED Notes (Signed)
Genelle Gather, PA at pt bedside at this time to attempt IV start.

## 2011-10-29 NOTE — ED Notes (Signed)
PA IS STILL ATTMPTING TO GAIN IV ACCESS. PA CONTINUES TO PLAN TO SEND PT TO CDU AFTER ACCESS OBTAINED

## 2011-10-29 NOTE — ED Notes (Signed)
Unsuccessful IV start by Genelle Gather, PA.  States she only wants Mag given IM and Potassium given PO.  No IV fluids or meds.

## 2011-10-29 NOTE — ED Notes (Signed)
IV team RN unable to obtain IV access.

## 2011-10-29 NOTE — ED Notes (Signed)
Message sent to pharmacy requesting med to be sent to [facility].  Face to face report given to Drinda Butts, RN - pt to go to CDU for observation.

## 2011-10-29 NOTE — H&P (Addendum)
PCP:   No Pcp originally from [place]  Chief Complaint:   shortness of breath/ wheezing  HPI: [name] [name] is a 34 y.o. female   has a past medical history of Asthma; Type 2 diabetes mellitus; Hypertension; Chronic anemia; Abnormal TSH; Morbid obesity; GERD (gastroesophageal reflux disease); Diabetes mellitus; Seasonal allergies; and Nasal polyps.   Presented with  For the past 3 days patient has had severe shortness of breath/wheezing she also endorses fevers. She was evaluated for this by emergency department. She had received nebulizer treatments and IV Sinemet control but continued to wheeze. Patient states that she is not far from her baseline. When she was attempted to be ambulated on she developed hypoxia at which point [facility] hospitalist was called on admission. Patient states that she had to be admitted in the past for similar and had to be intubated in the past although I do not have records of that.  During her last admissions she has been seen by pulmonology by Dr. Delton Coombes she was supposed to followup but I did not show up in the office.   Review of Systems:    Pertinent Positives: Shortness of breath wheezing fevers  Pertinent Negatives: Did not endorse any chest pain did not endorse nausea vomiting diaphoresis no diarrhea no neurological complaints no skin complaints Otherwise ROS are negative except for above, 10 systems were reviewed  Past Medical History: Past Medical History  Diagnosis Date  . Asthma   . Type 2 diabetes mellitus   . Hypertension   . Chronic anemia   . Abnormal TSH   . Morbid obesity   . GERD (gastroesophageal reflux disease)   . Diabetes mellitus   . Seasonal allergies   . Nasal polyps    No past surgical history on file.   Medications: Prior to Admission medications   Medication Sig Start Date End Date Taking? Authorizing Provider  ALPRAZolam (XANAX) 0.25 MG tablet Take 0.25 mg by mouth 2 (two) times daily.    Yes Historical Provider, MD   amLODipine (NORVASC) 10 MG tablet Take 10 mg by mouth daily.     Yes Historical Provider, MD  budesonide-formoterol (SYMBICORT) 160-4.5 MCG/ACT inhaler Inhale 2 puffs into the lungs 2 (two) times daily.     Yes Historical Provider, MD  diphenhydrAMINE (BENADRYL) 50 MG tablet Take 50 mg by mouth at bedtime as needed. For allergies    Yes Historical Provider, MD  fluticasone (FLONASE) 50 MCG/ACT nasal spray Place 2 sprays into the nose daily.     Yes Historical Provider, MD  folic acid (FOLVITE) 400 MCG tablet Take 400 mcg by mouth daily.     Yes Historical Provider, MD  ipratropium (ATROVENT) 0.02 % nebulizer solution Take 500 mcg by nebulization once.    Yes Historical Provider, MD  irbesartan-hydrochlorothiazide (AVALIDE) 300-25 MG per tablet Take 1 tablet by mouth daily.     Yes Historical Provider, MD  IRON PO Take 1 tablet by mouth daily.     Yes Historical Provider, MD  loratadine (CLARITIN) 10 MG tablet Take 10 mg by mouth daily.     Yes Historical Provider, MD  metFORMIN (GLUCOPHAGE) 1000 MG tablet Take 1,000 mg by mouth 2 (two) times daily with a meal.     Yes Historical Provider, MD  Multiple Vitamin (MULTIVITAMIN) capsule Take 1 capsule by mouth daily.     Yes Historical Provider, MD  pantoprazole (PROTONIX) 40 MG tablet Take 40 mg by mouth daily.    Yes Historical Provider, MD  SALINE NASAL SPRAY NA Place 1 spray into the nose 3 (three) times daily as needed.     Yes Historical Provider, MD  tiotropium (SPIRIVA) 18 MCG inhalation capsule Place 18 mcg into inhaler and inhale daily.    Yes Historical Provider, MD  albuterol (PROVENTIL) (5 MG/ML) 0.5% nebulizer solution Take 2.5 mg by nebulization once.      Historical Provider, MD    Allergies:   Allergies  Allergen Reactions  . Aspirin Free Childs (Acetaminophen) Anaphylaxis  . Ibuprofen Anaphylaxis  . Halls Cough Drops Swelling  . Shellfish Allergy Hives and Swelling   she states that the thermometer covers it gives her  desquamation of her mucosa in her mouth  Social History:  reports that she has never smoked. She does not have any smokeless tobacco history on file. She reports that she does not drink alcohol or use illicit drugs.   Family History: family history includes Diabetes in her mother and Heart disease in her father.    Physical Exam: Patient Vitals for the past 24 hrs:  BP Temp Temp src Pulse Resp SpO2 Height Weight  10/29/11 1722 159/72 mmHg 99.4 F (37.4 C) Oral 122  22  95 % - -  10/29/11 1520 - - - - - 98 % - -  10/29/11 1217 150/95 mmHg - - 130  22  100 % - -  10/29/11 1100 149/103 mmHg - - 115  14  100 % - -  10/29/11 1045 130/91 mmHg - - 121  21  100 % - -  10/29/11 1023 - - - - - 96 % - -  10/29/11 1015 140/98 mmHg - - - - 93 % - -  10/29/11 0930 169/97 mmHg - - 117  26  99 % - -  10/29/11 0900 160/97 mmHg - - 113  24  100 % - -  10/29/11 0815 169/85 mmHg - - 107  25  100 % - -  10/29/11 0806 - 98.9 F (37.2 C) Oral - - - - -  10/29/11 0745 157/100 mmHg - - 113  28  100 % 5\' 7"  (1.702 m) 138.347 kg (305 lb)     Alert and Oriented in No Acute distress, obese Mucous Membranes and Skin: Moist mucous membranes no rashes present Head Non traumatic neck supple Heart: Rapid but regular no murmurs rubs or gallops Lungs: Wheezes throughout bilaterally, fair air movement Abdomen: Obese but nontender nondistended Lower extremities: No clubbing cyanosis or edema Neurologically Grossly intact Skin clean Dry and intact no rash:  body mass index is 47.77 kg/(m^2).   Labs on Admission:   Basename 10/29/11 1148 10/29/11 0830  NA -- 139  K -- 2.6*  CL -- 101  CO2 -- 28  GLUCOSE -- 143*  BUN -- 4*  CREATININE -- 0.80  CALCIUM -- 8.8  MG 1.6 --  PHOS -- --   No results found for this basename: AST:2,ALT:2,ALKPHOS:2,BILITOT:2,PROT:2,ALBUMIN:2 in the last 72 hours No results found for this basename: LIPASE:2,AMYLASE:2 in the last 72 hours  Basename 10/29/11 0830  WBC 7.6    NEUTROABS 4.5  HGB 8.5*  HCT 29.1*  MCV 64.8*  PLT 353   No results found for this basename: CKTOTAL:3,CKMB:3,CKMBINDEX:3,TROPONINI:3 in the last 72 hours No results found for this basename: TSH,T4TOTAL,FREET3,T3FREE,THYROIDAB in the last 72 hours No results found for this basename: VITAMINB12:2,FOLATE:2,FERRITIN:2,TIBC:2,IRON:2,RETICCTPCT:2 in the last 72 hours Lab Results  Component Value Date   HGBA1C 6.5* 08/29/2011    Estimated Creatinine  Clearance: 141.7 ml/min (by C-G formula based on Cr of 0.8). ABG    Component Value Date/Time   PHART 7.435* 08/28/2011 1804   HCO3 23.7 08/28/2011 1804   TCO2 24.9 08/28/2011 1804   ACIDBASEDEF 2.0 06/07/2011 1109   O2SAT 77.1 08/28/2011 1804     Lab Results  Component Value Date   DDIMER  Value: 0.33        AT THE INHOUSE ESTABLISHED CUTOFF VALUE OF 0.48 ug/mL FEU, THIS ASSAY HAS BEEN DOCUMENTED IN THE LITERATURE TO HAVE 04/29/2008     Other results: ED ECG REPORT  Rate: 104  Rhythm: Sinus tachycardia ST&T Change: T wave flattening throughout which is changed from    Blood Culture    Component Value Date/Time   SDES BLOOD 06/07/2011 0905   SPECREQUEST BOTTLES DRAWN AEROBIC AND ANAEROBIC RIGHT IJ  10CC 06/07/2011 0905   CULT NO GROWTH 5 DAYS 06/07/2011 0905   REPTSTATUS 06/13/2011 FINAL 06/07/2011 0905       Radiological Exams on Admission: Dg Chest 2 View  10/29/2011  *RADIOLOGY REPORT*  Clinical Data: Cough, short of breath, dizziness  CHEST - 2 VIEW  Comparison: Portable chest x-ray of 08/28/2011  Findings: No active infiltrate or effusion is seen.  There are prominent perihilar markings with peribronchial thickening which may indicate bronchitis.  The heart is mildly enlarged.  No acute bony abnormality is seen.  IMPRESSION: No pneumonia.  Peribronchial thickening may indicate bronchitis. Stable cardiomegaly.  Original Report Authenticated By: Juline Patch, M.D.    Assessment/Plan  Present on Admission:  .Asthma exacerbation -  will admit the step down will give IV steroids Xopenex and Atrovent nebulizers continue home inhaler and steroids, Avelox for possible bronchitis. I have spoke to Dr. Delton Coombes regards to the patient who will see her in the morning  .Bronchitis - see above Will cover with Avelox  .HTN (hypertension), malignant - severe we'll restart home medications and give hydralazine when necessary  .Hypokalemia - replace and repeat potassium levels, already received magnesium  .Anemia  - was supposed to workup as outpatient but never did,  will get Hemoccult stool and anemia panel  .GERD (gastroesophageal reflux disease) - Protonix  .Type 2 diabetes mellitus - sliding scale hold metformin while hospitalized  .Tachycardia - likely related to respiratory distress/albuterol administration currently improved will watch and step down  .Abnormal EKG - nonspecific T wave changes given her history of shortness of breath and family history early onset coronary artery disease will cycle cardiac markers.   Prophylaxis: Lovenox and Protonix    [name] [name] 10/29/2011, 9:05 PM

## 2011-10-29 NOTE — ED Notes (Signed)
Pt has hx of asthma with last intubation in 04/2011.  Gradual onset of sob 3 days ago with increased sob this morning.  Per EMS, pt speaking with broken sentences and in moderate resp distress on their arrival - sats in mid 90's.  Hx Htn, dm, asthma.

## 2011-10-29 NOTE — ED Notes (Signed)
Pt has ambulated to the restroom without sob or coughing.

## 2011-10-30 ENCOUNTER — Encounter (HOSPITAL_COMMUNITY): Payer: Self-pay | Admitting: Internal Medicine

## 2011-10-30 ENCOUNTER — Other Ambulatory Visit: Payer: Self-pay

## 2011-10-30 DIAGNOSIS — J9600 Acute respiratory failure, unspecified whether with hypoxia or hypercapnia: Secondary | ICD-10-CM

## 2011-10-30 DIAGNOSIS — J45901 Unspecified asthma with (acute) exacerbation: Secondary | ICD-10-CM

## 2011-10-30 LAB — CBC
Hemoglobin: 8.3 g/dL — ABNORMAL LOW (ref 12.0–15.0)
MCH: 18.8 pg — ABNORMAL LOW (ref 26.0–34.0)
MCHC: 28.9 g/dL — ABNORMAL LOW (ref 30.0–36.0)
MCV: 65.1 fL — ABNORMAL LOW (ref 78.0–100.0)
RBC: 4.41 MIL/uL (ref 3.87–5.11)

## 2011-10-30 LAB — COMPREHENSIVE METABOLIC PANEL
ALT: 12 U/L (ref 0–35)
AST: 17 U/L (ref 0–37)
Albumin: 3.5 g/dL (ref 3.5–5.2)
CO2: 24 mEq/L (ref 19–32)
Calcium: 8.6 mg/dL (ref 8.4–10.5)
Chloride: 104 mEq/L (ref 96–112)
Creatinine, Ser: 0.61 mg/dL (ref 0.50–1.10)
GFR calc non Af Amer: >90 mL/min (ref >90)
Sodium: 137 mEq/L (ref 135–145)
Total Bilirubin: 0.3 mg/dL (ref 0.3–1.2)

## 2011-10-30 LAB — POCT I-STAT 3, ART BLOOD GAS (G3+)
TCO2: 27 mmol/L (ref 0–100)
pCO2 arterial: 43.9 mmHg (ref 35.0–45.0)
pH, Arterial: 7.375 (ref 7.350–7.400)

## 2011-10-30 LAB — EXPECTORATED SPUTUM ASSESSMENT W GRAM STAIN, RFLX TO RESP C: Special Requests: NORMAL

## 2011-10-30 LAB — GLUCOSE, CAPILLARY
Glucose-Capillary: 216 mg/dL — ABNORMAL HIGH (ref 70–99)
Glucose-Capillary: 283 mg/dL — ABNORMAL HIGH (ref 70–99)

## 2011-10-30 LAB — CARDIAC PANEL(CRET KIN+CKTOT+MB+TROPI)
Relative Index: 0.8 (ref 0.0–2.5)
Relative Index: 1.4 (ref 0.0–2.5)
Total CK: 223 U/L — ABNORMAL HIGH (ref 7–177)
Total CK: 210 U/L — ABNORMAL HIGH (ref 7–177)
Total CK: 186 U/L — ABNORMAL HIGH (ref 7–177)
Troponin I: <0.3 ng/mL (ref <0.30)

## 2011-10-30 LAB — IRON AND TIBC
Iron: <10 ug/dL — ABNORMAL LOW (ref 42–135)
UIBC: 371 ug/dL (ref 125–400)

## 2011-10-30 LAB — RETICULOCYTES: Retic Ct Pct: 1.7 % (ref 0.4–3.1)

## 2011-10-30 LAB — FERRITIN: Ferritin: 6 ng/mL — ABNORMAL LOW (ref 10–291)

## 2011-10-30 LAB — TSH: TSH: 0.283 u[IU]/mL — ABNORMAL LOW (ref 0.350–4.500)

## 2011-10-30 LAB — HEMOGLOBIN A1C: Hgb A1c MFr Bld: 6.2 % — ABNORMAL HIGH (ref <5.7)

## 2011-10-30 LAB — PHOSPHORUS: Phosphorus: 2.4 mg/dL (ref 2.3–4.6)

## 2011-10-30 MED ORDER — OXYCODONE HCL 5 MG PO TABS: 5.0000 mg | ORAL_TABLET | ORAL | Status: DC | PRN

## 2011-10-30 MED ORDER — OLMESARTAN MEDOXOMIL 40 MG PO TABS
40.0000 mg | ORAL_TABLET | Freq: Every day | ORAL | Status: DC
  Administered 2011-10-30 – 2011-11-01 (×3): 40 mg via ORAL
  Filled 2011-10-30 (×3): qty 1

## 2011-10-30 MED ORDER — ONDANSETRON HCL 4 MG PO TABS: 4.0000 mg | ORAL_TABLET | Freq: Four times a day (QID) | ORAL | Status: DC | PRN

## 2011-10-30 MED ORDER — GUAIFENESIN ER 600 MG PO TB12
600.0000 mg | ORAL_TABLET | Freq: Two times a day (BID) | ORAL | Status: DC
  Administered 2011-10-30 – 2011-11-01 (×5): 600 mg via ORAL
  Filled 2011-10-30 (×7): qty 1

## 2011-10-30 MED ORDER — HYDRALAZINE HCL 20 MG/ML IJ SOLN
10.0000 mg | INTRAMUSCULAR | Status: DC | PRN
  Filled 2011-10-30: qty 1

## 2011-10-30 MED ORDER — SALINE SPRAY 0.65 % NA SOLN: 1.0000 | NASAL | Status: DC | PRN

## 2011-10-30 MED ORDER — BUDESONIDE-FORMOTEROL FUMARATE 160-4.5 MCG/ACT IN AERO
2.0000 | INHALATION_SPRAY | Freq: Two times a day (BID) | RESPIRATORY_TRACT | Status: DC
  Administered 2011-10-30 – 2011-11-01 (×6): 2 via RESPIRATORY_TRACT
  Filled 2011-10-30: qty 6

## 2011-10-30 MED ORDER — INSULIN ASPART 100 UNIT/ML ~~LOC~~ SOLN
0.0000 [IU] | Freq: Three times a day (TID) | SUBCUTANEOUS | Status: DC
  Administered 2011-10-30: 11 [IU] via SUBCUTANEOUS
  Administered 2011-10-31: 20 [IU] via SUBCUTANEOUS
  Administered 2011-10-31: 7 [IU] via SUBCUTANEOUS
  Administered 2011-10-31: 11 [IU] via SUBCUTANEOUS
  Administered 2011-11-01: 20 [IU] via SUBCUTANEOUS
  Administered 2011-11-01: 4 [IU] via SUBCUTANEOUS

## 2011-10-30 MED ORDER — DEXTROSE 10 % IV SOLN: INTRAVENOUS | Status: DC

## 2011-10-30 MED ORDER — HYDROCHLOROTHIAZIDE 25 MG PO TABS
25.0000 mg | ORAL_TABLET | Freq: Every day | ORAL | Status: DC
  Administered 2011-10-30 – 2011-11-01 (×3): 25 mg via ORAL
  Filled 2011-10-30 (×3): qty 1

## 2011-10-30 MED ORDER — IPRATROPIUM BROMIDE 0.02 % IN SOLN
0.5000 mg | Freq: Four times a day (QID) | RESPIRATORY_TRACT | Status: DC
  Administered 2011-10-30 – 2011-11-01 (×11): 0.5 mg via RESPIRATORY_TRACT
  Filled 2011-10-30 (×9): qty 2.5

## 2011-10-30 MED ORDER — IPRATROPIUM BROMIDE 0.02 % IN SOLN: 500.0000 ug | Freq: Once | RESPIRATORY_TRACT | Status: AC

## 2011-10-30 MED ORDER — INSULIN GLARGINE 100 UNIT/ML ~~LOC~~ SOLN
20.0000 [IU] | Freq: Every day | SUBCUTANEOUS | Status: DC
  Administered 2011-10-30: 20 [IU] via SUBCUTANEOUS
  Filled 2011-10-30: qty 3

## 2011-10-30 MED ORDER — POTASSIUM CHLORIDE 10 MEQ/100ML IV SOLN
10.0000 meq | INTRAVENOUS | Status: AC
  Administered 2011-10-30 (×4): 10 meq via INTRAVENOUS
  Filled 2011-10-30 (×4): qty 100

## 2011-10-30 MED ORDER — INSULIN ASPART 100 UNIT/ML ~~LOC~~ SOLN
0.0000 [IU] | Freq: Every day | SUBCUTANEOUS | Status: DC
  Administered 2011-10-30: 3 [IU] via SUBCUTANEOUS
  Administered 2011-10-31: 2 [IU] via SUBCUTANEOUS

## 2011-10-30 MED ORDER — AMLODIPINE BESYLATE 10 MG PO TABS
10.0000 mg | ORAL_TABLET | Freq: Every day | ORAL | Status: DC
  Administered 2011-10-30 – 2011-11-01 (×3): 10 mg via ORAL
  Filled 2011-10-30 (×3): qty 1

## 2011-10-30 MED ORDER — ALPRAZOLAM 0.25 MG PO TABS
0.2500 mg | ORAL_TABLET | Freq: Two times a day (BID) | ORAL | Status: DC
  Administered 2011-10-30 – 2011-11-01 (×6): 0.25 mg via ORAL
  Filled 2011-10-30 (×6): qty 1

## 2011-10-30 MED ORDER — ONDANSETRON HCL 4 MG/2ML IJ SOLN: 4.0000 mg | Freq: Four times a day (QID) | INTRAMUSCULAR | Status: DC | PRN

## 2011-10-30 MED ORDER — INSULIN ASPART 100 UNIT/ML ~~LOC~~ SOLN
0.0000 [IU] | SUBCUTANEOUS | Status: DC
  Administered 2011-10-30: 4 [IU] via SUBCUTANEOUS
  Administered 2011-10-30: 3 [IU] via SUBCUTANEOUS
  Administered 2011-10-30: 4 [IU] via SUBCUTANEOUS

## 2011-10-30 MED ORDER — ALUM & MAG HYDROXIDE-SIMETH 200-200-20 MG/5ML PO SUSP: 30.0000 mL | Freq: Four times a day (QID) | ORAL | Status: DC | PRN

## 2011-10-30 MED ORDER — ENOXAPARIN SODIUM 80 MG/0.8ML ~~LOC~~ SOLN
70.0000 mg | SUBCUTANEOUS | Status: DC
  Administered 2011-10-30 – 2011-11-01 (×3): 70 mg via SUBCUTANEOUS
  Filled 2011-10-30 (×3): qty 0.8

## 2011-10-30 MED ORDER — DIPHENHYDRAMINE HCL 25 MG PO TABS
50.0000 mg | ORAL_TABLET | Freq: Every evening | ORAL | Status: DC | PRN
  Administered 2011-10-30 – 2011-10-31 (×2): 50 mg via ORAL
  Filled 2011-10-30 (×2): qty 2

## 2011-10-30 MED ORDER — LEVALBUTEROL HCL 0.63 MG/3ML IN NEBU
0.6300 mg | INHALATION_SOLUTION | Freq: Four times a day (QID) | RESPIRATORY_TRACT | Status: DC
  Administered 2011-10-30 – 2011-11-01 (×11): 0.63 mg via RESPIRATORY_TRACT
  Filled 2011-10-30 (×14): qty 3

## 2011-10-30 MED ORDER — SODIUM CHLORIDE 0.9 % IJ SOLN
3.0000 mL | Freq: Two times a day (BID) | INTRAMUSCULAR | Status: DC
  Administered 2011-10-30: 3 mL via INTRAVENOUS
  Administered 2011-10-30: 22:00:00 via INTRAVENOUS
  Administered 2011-10-31: 3 mL via INTRAVENOUS

## 2011-10-30 MED ORDER — ENOXAPARIN SODIUM 40 MG/0.4ML ~~LOC~~ SOLN: 40.0000 mg | SUBCUTANEOUS | Status: DC

## 2011-10-30 MED ORDER — INSULIN ASPART 100 UNIT/ML ~~LOC~~ SOLN: 0.0000 [IU] | SUBCUTANEOUS | Status: DC

## 2011-10-30 MED ORDER — SODIUM CHLORIDE 0.9 % IV SOLN: INTRAVENOUS | Status: DC

## 2011-10-30 MED ORDER — PANTOPRAZOLE SODIUM 40 MG PO TBEC
40.0000 mg | DELAYED_RELEASE_TABLET | Freq: Every day | ORAL | Status: DC
  Administered 2011-10-30 – 2011-11-01 (×3): 40 mg via ORAL
  Filled 2011-10-30 (×3): qty 1

## 2011-10-30 MED ORDER — SODIUM CHLORIDE 0.9 % IV SOLN
250.0000 mL | INTRAVENOUS | Status: DC | PRN
  Administered 2011-10-30: 250 mL via INTRAVENOUS

## 2011-10-30 MED ORDER — SODIUM CHLORIDE 0.9 % IJ SOLN: 3.0000 mL | INTRAMUSCULAR | Status: DC | PRN

## 2011-10-30 MED ORDER — LEVALBUTEROL HCL 0.63 MG/3ML IN NEBU
0.6300 mg | INHALATION_SOLUTION | RESPIRATORY_TRACT | Status: DC | PRN
  Administered 2011-10-31: 0.63 mg via RESPIRATORY_TRACT
  Filled 2011-10-30: qty 3

## 2011-10-30 MED ORDER — METHYLPREDNISOLONE SODIUM SUCC 125 MG IJ SOLR
125.0000 mg | Freq: Four times a day (QID) | INTRAMUSCULAR | Status: DC
  Administered 2011-10-30 – 2011-10-31 (×5): 125 mg via INTRAVENOUS
  Filled 2011-10-30 (×8): qty 2

## 2011-10-30 MED ORDER — METHYLPREDNISOLONE SODIUM SUCC 125 MG IJ SOLR: 60.0000 mg | Freq: Four times a day (QID) | INTRAMUSCULAR | Status: DC

## 2011-10-30 MED ORDER — INSULIN ASPART 100 UNIT/ML ~~LOC~~ SOLN
6.0000 [IU] | Freq: Three times a day (TID) | SUBCUTANEOUS | Status: DC
  Administered 2011-10-30 – 2011-11-01 (×6): 6 [IU] via SUBCUTANEOUS

## 2011-10-30 MED ORDER — INFLUENZA VIRUS VACC SPLIT PF IM SUSP
0.5000 mL | INTRAMUSCULAR | Status: AC
  Filled 2011-10-30: qty 0.5

## 2011-10-30 MED ORDER — MORPHINE SULFATE 2 MG/ML IJ SOLN: 2.0000 mg | INTRAMUSCULAR | Status: DC | PRN

## 2011-10-30 MED ORDER — MOXIFLOXACIN HCL IN NACL 400 MG/250ML IV SOLN
400.0000 mg | Freq: Every day | INTRAVENOUS | Status: DC
  Administered 2011-10-30 – 2011-10-31 (×3): 400 mg via INTRAVENOUS
  Filled 2011-10-30 (×5): qty 250

## 2011-10-30 MED ORDER — INSULIN ASPART 100 UNIT/ML ~~LOC~~ SOLN
0.0000 [IU] | Freq: Three times a day (TID) | SUBCUTANEOUS | Status: DC
  Filled 2011-10-30: qty 3

## 2011-10-30 MED ORDER — LORATADINE 10 MG PO TABS
10.0000 mg | ORAL_TABLET | Freq: Every day | ORAL | Status: DC
  Administered 2011-10-30 – 2011-11-01 (×3): 10 mg via ORAL
  Filled 2011-10-30 (×3): qty 1

## 2011-10-30 MED ORDER — FLUTICASONE PROPIONATE 50 MCG/ACT NA SUSP
2.0000 | Freq: Every day | NASAL | Status: DC
  Administered 2011-10-30 – 2011-11-01 (×3): 2 via NASAL
  Filled 2011-10-30: qty 16

## 2011-10-30 MED ORDER — INSULIN ASPART 100 UNIT/ML ~~LOC~~ SOLN: 0.0000 [IU] | Freq: Every day | SUBCUTANEOUS | Status: DC

## 2011-10-30 MED ORDER — IRBESARTAN-HYDROCHLOROTHIAZIDE 300-25 MG PO TABS: 1.0000 | ORAL_TABLET | Freq: Every day | ORAL | Status: DC

## 2011-10-30 MED ORDER — METHYLPREDNISOLONE SODIUM SUCC 125 MG IJ SOLR
125.0000 mg | Freq: Once | INTRAMUSCULAR | Status: AC
  Administered 2011-10-30: 125 mg via INTRAVENOUS
  Filled 2011-10-30 (×2): qty 2

## 2011-10-30 MED ORDER — GUAIFENESIN-DM 100-10 MG/5ML PO SYRP
5.0000 mL | ORAL_SOLUTION | ORAL | Status: DC | PRN
  Administered 2011-10-31: 5 mL via ORAL
  Filled 2011-10-30 (×2): qty 5

## 2011-10-30 NOTE — Progress Notes (Signed)
[facility] hospitalist note for Dr. Ashley Royalty. Chief complaint. Dyspnea. History of present illness. This female patient admitted with acute exacerbation of asthma. Staph far received her from the emergency room and found her to be quite dyspneic. I saw the patient at the bedside with Dr. Synetta Fail of pulmonary critical care. We reviewed her history and current orders together. Vital signs. Temperature 100, pulse 138, respirations 29, blood pressure 149/94, O2 sats 98% on face mask. General appearance obese female who is alert but has difficulty speaking in full sentences. She does appear dyspneic. Cardiac. Rhythm regular, rate tachycardic. Lungs. Upper airway stridor. Increased work of breathing. She does have some expiratory wheezes. Diminished in the bases bilaterally. Abdomen. Soft with positive bowel sounds. Impression/plan #1 COPD exacerbation. Patient is seeing with Dr. Synetta Fail pulmonary critical care. A blood gas obtained pH 7.36, PCO2 45, PO2 83, bicarbonate 25. Based on this we will continue on facemask oxygen. She does have Xanax for anxiety. We increased her spirits with Solu-Medrol 125 mg every 6 hours. She continues on current antibiotics Avelox. Will add a sputum culture and respiratory viral panel. Pulmonology critical care we'll continue to follow in consult.

## 2011-10-30 NOTE — ED Provider Notes (Signed)
Medical screening examination/treatment/procedure(s) were performed by non-physician practitioner and as supervising physician I was immediately available for consultation/collaboration.  Nicholes Stairs, MD 10/30/11 [phone]

## 2011-10-30 NOTE — Consult Note (Signed)
Reason for Consult:Asthma exacerbation Referring Physician: Sunya Humbarger is an 35 y.o. female.  HPI: Pt is 35 y/o AAF with PMHx of Asthma, DM, HTN, morbid obesity and seasonal allergies, who was admitted to [facility] due to worsening of SOB, cough and wheezing for the last couple of days.  As per the patient and the medical records, She reports that she has been having increase cough with greenish sputum for the last 2 days, associated with increased SOB and wheezing. She has been seen at the pulm clinic and has been on Symbicort 160/4.5 bid, spiriva daily, Levalbuterol Q6h (4 times a day lately due to increased SOB). She was on singulair before but it was discontinued and she has been on benadryl for allergies. The last asthma exacerbation was in June/12 and she is not on chronic steroid treatment. She denies sick contacts. No hemoptysis, chest pain, fevers or chills.  ROS: all other system were negative except as above in HPI.  Past Medical History  Diagnosis Date  . Asthma   . Type 2 diabetes mellitus   . Hypertension   . Chronic anemia   . Abnormal TSH   . Morbid obesity   . GERD (gastroesophageal reflux disease)   . Diabetes mellitus   . Seasonal allergies   . Nasal polyps     No past surgical history on file.  Family History  Problem Relation Age of Onset  . Diabetes Mother   . Heart disease Father     Social History:  reports that she has never smoked. She does not have any smokeless tobacco history on file. She reports that she does not drink alcohol or use illicit drugs.  Allergies:  Allergies  Allergen Reactions  . Aspirin Free Childs (Acetaminophen) Anaphylaxis  . Ibuprofen Anaphylaxis  . Halls Cough Drops Swelling  . Shellfish Allergy Hives and Swelling    Medications: I have reviewed the patient's current medications.  Results for orders placed during the hospital encounter of 10/29/11 (from the past 48 hour(s))  CBC     Status: Abnormal   Collection Time   10/29/11  8:30 AM      Component Value Range Comment   WBC 7.6  4.0 - 10.5 (K/uL)    RBC 4.49  3.87 - 5.11 (MIL/uL)    Hemoglobin 8.5 (*) 12.0 - 15.0 (g/dL)    HCT 16.1 (*) 09.6 - 46.0 (%)    MCV 64.8 (*) 78.0 - 100.0 (fL)    MCH 18.9 (*) 26.0 - 34.0 (pg)    MCHC 29.2 (*) 30.0 - 36.0 (g/dL)    RDW 04.5 (*) 40.9 - 15.5 (%)    Platelets 353  150 - 400 (K/uL)   DIFFERENTIAL     Status: Abnormal   Collection Time   10/29/11  8:30 AM      Component Value Range Comment   Neutrophils Relative 59  43 - 77 (%)    Lymphocytes Relative 20  12 - 46 (%)    Monocytes Relative 5  3 - 12 (%)    Eosinophils Relative 16 (*) 0 - 5 (%)    Basophils Relative 0  0 - 1 (%)    Neutro Abs 4.5  1.7 - 7.7 (K/uL)    Lymphs Abs 1.5  0.7 - 4.0 (K/uL)    Monocytes Absolute 0.4  0.1 - 1.0 (K/uL)    Eosinophils Absolute 1.2 (*) 0.0 - 0.7 (K/uL)    Basophils Absolute 0.0  0.0 - 0.1 (  K/uL)    RBC Morphology ELLIPTOCYTES      Smear Review PLATELETS APPEAR ADEQUATE   LARGE PLATELETS PRESENT  BASIC METABOLIC PANEL     Status: Abnormal   Collection Time   10/29/11  8:30 AM      Component Value Range Comment   Sodium 139  135 - 145 (mEq/L)    Potassium 2.6 (*) 3.5 - 5.1 (mEq/L)    Chloride 101  96 - 112 (mEq/L)    CO2 28  19 - 32 (mEq/L)    Glucose, Bld 143 (*) 70 - 99 (mg/dL)    BUN 4 (*) 6 - 23 (mg/dL)    Creatinine, Ser 4.09  0.50 - 1.10 (mg/dL)    Calcium 8.8  8.4 - 10.5 (mg/dL)    GFR calc non Af Amer >90  >90 (mL/min)    GFR calc Af Amer >90  >90 (mL/min)   MAGNESIUM     Status: Normal   Collection Time   10/29/11 11:48 AM      Component Value Range Comment   Magnesium 1.6  1.5 - 2.5 (mg/dL)   BASIC METABOLIC PANEL     Status: Abnormal   Collection Time   10/29/11  9:29 PM      Component Value Range Comment   Sodium 137  135 - 145 (mEq/L)    Potassium 3.3 (*) 3.5 - 5.1 (mEq/L)    Chloride 102  96 - 112 (mEq/L)    CO2 23  19 - 32 (mEq/L)    Glucose, Bld 262 (*) 70 - 99 (mg/dL)      BUN 7  6 - 23 (mg/dL)    Creatinine, Ser 8.11  0.50 - 1.10 (mg/dL)    Calcium 8.9  8.4 - 10.5 (mg/dL)    GFR calc non Af Amer >90  >90 (mL/min)    GFR calc Af Amer >90  >90 (mL/min)   POCT I-STAT 3, BLOOD GAS (G3+)     Status: Abnormal   Collection Time   10/30/11  1:55 AM      Component Value Range Comment   pH, Arterial 7.375  7.350 - 7.400     pCO2 arterial 43.9  35.0 - 45.0 (mmHg)    pO2, Arterial 79.0 (*) 80.0 - 100.0 (mmHg)    Bicarbonate 25.9 (*) 20.0 - 24.0 (mEq/L)    TCO2 27  0 - 100 (mmol/L)    O2 Saturation 96.0      Patient temperature 97.3 F      Collection site RADIAL, ALLEN'S TEST ACCEPTABLE      Drawn by RT      Sample type ARTERIAL       Dg Chest 2 View  10/29/2011  *RADIOLOGY REPORT*  Clinical Data: Cough, short of breath, dizziness  CHEST - 2 VIEW  Comparison: Portable chest x-ray of 08/28/2011  Findings: No active infiltrate or effusion is seen.  There are prominent perihilar markings with peribronchial thickening which may indicate bronchitis.  The heart is mildly enlarged.  No acute bony abnormality is seen.  IMPRESSION: No pneumonia.  Peribronchial thickening may indicate bronchitis. Stable cardiomegaly.  Original Report Authenticated By: Juline Patch, M.D.    ROS Blood pressure 149/94, pulse 138, temperature 100 F (37.8 C), temperature source Oral, resp. rate 29, height 5\' 7"  (1.702 m), weight 138.347 kg (305 lb), last menstrual period 10/04/2011, SpO2 96.00%. Physical Exam Pt is alert, oriented x3,  HEENT: oral mucosa is moist, no JVD, no post nasal drip  CV: tachycardic rhythm. S1 and S2 normal Chest: decreased air entry, with positive wheezes on the R lung. No crackles. Positive component of expiratory stridor. Abdomen: soft, obese, non tender, non distended. BS +. No peritoneal signs. Extrem: no low extrem edema, no calf tenderness. Neurol: alert, oriented x3, no focal deficit.  Assessment/Plan:  Pt is 35 y/o AAF with PMHx of Asthma, DM, HTN,  morbid obesity and seasonal allergies, who was admitted to [facility] due to worsening of SOB, cough and wheezing for the last couple of days.  1. Asthma exacerbation: - Pt has Hx of sever persistent asthma on treatment with Symbicort high dose, spiriva and levalbuterol - last steroid taper in June/12 - CXR with no focal infiltrates, possible peribronchial thickening.  - CBC with E 17% - Started on Solumedrol 125 mg/ Q6h. Will taper over the next days to prednisone oral - Levalbuterol 0.63 Q6h + Q4h PRN SOB - Ipratropium 0.5mg  Q6h - Resp culture, viral panel including influenza - ABG - Cont Claritin - Pt might be a candidate for immunotherapy as outpatient if IGE level is high - Agree to replace K and Mg. Follow BMP and Mg, phosp. - Continue Avelox - Pt will be on the ICU monitor overnight and we will cont to follow.  Thanks for this interesting consult.  Lamiyah Schlotter 10/30/2011, 2:02 AM

## 2011-10-30 NOTE — Consult Note (Signed)
Reason for Consult:Asthma exacerbation Referring Physician: Mafalda Mcginniss is an 35 y.o. female.  HPI: Pt is 35 y/o AAF with PMHx of Asthma, DM, HTN, morbid obesity and seasonal allergies, who was admitted to [facility] due to worsening of SOB, cough and wheezing for the last couple of days.  As per the patient and the medical records, She reports that she has been having increase cough with greenish sputum for the last 2 days, associated with increased SOB and wheezing. She has been seen at the pulm clinic and has been on Symbicort 160/4.5 bid, spiriva daily, Levalbuterol Q6h (4 times a day lately due to increased SOB). She was on singulair before but it was discontinued and she has been on benadryl for allergies. The last asthma exacerbation was in June/12 and she is not on chronic steroid treatment. She denies sick contacts. No hemoptysis, chest pain, fevers or chills.  PE Filed Vitals:   10/30/11 1203  BP:   Pulse:   Temp: 98.3 F (36.8 C)  Resp:    Neuro: Alert and oriented, following all commands. HEENT: Naplate/AT, PERRL, EOM-I, -LAN and -thyromegally. Hrt: RRR, Nl S1/S2, -M/R/G. Pul: Diffuse end exp wheezes. Abd: Soft, NT, ND and +BS. Ext: -edema and -tenderness.  BMET    Component Value Date/Time   NA 137 10/30/2011 0700   K 4.1 10/30/2011 0700   CL 104 10/30/2011 0700   CO2 24 10/30/2011 0700   GLUCOSE 228* 10/30/2011 0700   BUN 7 10/30/2011 0700   CREATININE 0.61 10/30/2011 0700   CALCIUM 8.6 10/30/2011 0700   GFRNONAA >90 10/30/2011 0700   GFRAA >90 10/30/2011 0700   CBC    Component Value Date/Time   WBC 10.5 10/30/2011 0700   RBC 4.41 10/30/2011 0700   HGB 8.3* 10/30/2011 0700   HCT 28.7* 10/30/2011 0700   PLT 357 10/30/2011 0700   MCV 65.1* 10/30/2011 0700   MCH 18.8* 10/30/2011 0700   MCHC 28.9* 10/30/2011 0700   RDW 18.7* 10/30/2011 0700   LYMPHSABS 1.5 10/29/2011 0830   MONOABS 0.4 10/29/2011 0830   EOSABS 1.2* 10/29/2011 0830   BASOSABS 0.0 10/29/2011 0830     Assessment/Plan:  Pt is 35 y/o AAF with PMHx of Asthma, DM, HTN, morbid obesity and seasonal allergies, who was admitted to [facility] due to worsening of SOB, cough and wheezing for the last couple of days.  1. Asthma exacerbation: - Pt has Hx of sever persistent asthma on treatment with Symbicort high dose, spiriva and levalbuterol - last steroid taper in June 12 - CXR with no focal infiltrates, possible peribronchial thickening.  - CBC with E 17% ? candidate for zolair as outpatient, will need to see pulmonary as outpatient and will check IgE level today. - Continue on Solumedrol 125 mg/ Q6h, will decrease to 80 q6 in AM. - Levalbuterol 0.63 Q6h + Q4h PRN SOB. - Ipratropium 0.5mg  Q6h, off spiriva for now. - Resp culture, viral panel including influenza pending. - Cont Claritin - Pt might be a candidate for immunotherapy as outpatient if IGE level is high, will be ordered today. - Agree to replace K and Mg. Follow BMP and Mg, phosp. - Continue Avelox. - Keep on SDU overnight.  Thanks for this interesting consult.  YACOUB,WESAM 10/30/2011, 12:03 PM

## 2011-10-30 NOTE — Progress Notes (Signed)
Subjective: [name] [name] is a 35 y.o. female with a past medical history of Asthma; Type 2 diabetes mellitus; Hypertension; Chronic anemia; Abnormal TSH; Morbid obesity; GERD (gastroesophageal reflux disease); Diabetes mellitus; Seasonal allergies; and Nasal polyps. For the past 3 days the patient has had severe shortness of breath/wheezing.   The patient feels that her breathing has improved significantly today. She states that she is still not back to her baseline. She is experiencing some continued wheezing at rest states it is my seizure to catch her breath and that she does not feel that her breathing is "as tight as it was yesterday". She denies chest pain nausea vomiting fevers chills or abdominal pain to  Objective: Weight change:   Intake/Output Summary (Last 24 hours) at 10/30/11 1204 Last data filed at 10/30/11 1022  Gross per 24 hour  Intake   1153 ml  Output      0 ml  Net   1153 ml   Blood pressure 122/99, pulse 111, temperature 98.3 F (36.8 C), temperature source Oral, resp. rate 21, height 5\' 7"  (1.702 m), weight 136.4 kg (300 lb 11.3 oz), last menstrual period 10/04/2011, SpO2 99.00%. Temp:  [97.1 F (36.2 C)-100 F (37.8 C)] 98.3 F (36.8 C) (12/01 1203) Pulse Rate:  [103-144] 111  (12/01 0954) Resp:  [15-32] 21  (12/01 0954) BP: (114-159)/(72-128) 122/99 mmHg (12/01 0954) SpO2:  [91 %-100 %] 99 % (12/01 0954) FiO2 (%):  [100 %] 100 % (12/01 0400) Weight:  [136.4 kg (300 lb 11.3 oz)-137.3 kg (302 lb 11.1 oz)] 300 lb 11.3 oz (136.4 kg) (12/01 0500)  Physical Exam: General: No acute respiratory distress, able to complete full sentences without pauses Lungs: Clear to auscultation bilaterally with exception to mild expiratory wheezing with no focal crackles Cardiovascular: Regular rate and rhythm without murmur gallop or rub normal S1 and S2 Abdomen: Morbidly obese, Nontender, nondistended, soft, bowel sounds positive, no rebound, no ascites, no appreciable  mass Extremities: No significant cyanosis, clubbing, or edema bilateral lower extremities  Lab Results:  [facility] 10/30/11 0700 10/29/11 2129 10/29/11 1148 10/29/11 0830  NA 137 137 -- 139  K 4.1 3.3* -- 2.6*  CL 104 102 -- 101  CO2 24 23 -- 28  GLUCOSE 228* 262* -- 143*  BUN 7 7 -- 4*  CREATININE 0.61 0.74 -- 0.80  CALCIUM 8.6 8.9 -- 8.8  MG 2.2 -- 1.6 --  PHOS 2.4 -- -- --    Basename 10/30/11 0700  AST 17  ALT 12  ALKPHOS 75  BILITOT 0.3  PROT 6.5  ALBUMIN 3.5   No results found for this basename: LIPASE:2,AMYLASE:2 in the last 72 hours  Basename 10/30/11 0700 10/29/11 0830  WBC 10.5 7.6  NEUTROABS -- 4.5  HGB 8.3* 8.5*  HCT 28.7* 29.1*  MCV 65.1* 64.8*  PLT 357 353    Basename 10/30/11 0700 10/30/11 0144  CKTOTAL 210* 223*  CKMB 2.9 1.7  CKMBINDEX -- --  TROPONINI <0.30 <0.30   No results found for this basename: POCBNP:3 in the last 72 hours No results found for this basename: DDIMER:2 in the last 72 hours No results found for this basename: HGBA1C:12 in the last 72 hours No results found for this basename: CHOL:2,HDL:2,LDLCALC:2,TRIG:2,CHOLHDL:2,LDLDIRECT:2 in the last 72 hours No results found for this basename: TSH,T4TOTAL,FREET3,T3FREE,THYROIDAB in the last 72 hours  Basename 10/30/11 0700  VITAMINB12 --  FOLATE --  FERRITIN --  TIBC --  IRON --  RETICCTPCT 1.7    Micro Results:  Recent Results (from the past 240 hour(s))  MRSA PCR SCREENING     Status: Normal   Collection Time   10/30/11  2:40 AM      Component Value Range Status Comment   MRSA by PCR NEGATIVE  NEGATIVE  Final   CULTURE, SPUTUM-ASSESSMENT     Status: Normal   Collection Time   10/30/11  3:37 AM      Component Value Range Status Comment   Specimen Description SPUTUM   Final    Special Requests Normal   Final    Sputum evaluation     Final    Value: THIS SPECIMEN IS ACCEPTABLE. RESPIRATORY CULTURE REPORT TO FOLLOW.   Report Status 10/30/2011 FINAL   Final      Studies/Results: Scheduled Meds:   . albuterol  5 mg Nebulization Once  . albuterol  10 mg Nebulization Once  . ALPRAZolam  0.25 mg Oral BID  . amLODipine  10 mg Oral Daily  . budesonide-formoterol  2 puff Inhalation BID  . chlorpheniramine-HYDROcodone  5 mL Oral Once  . enoxaparin (LOVENOX) injection  70 mg Subcutaneous Q24H  . fluticasone  2 spray Each Nare Daily  . gi cocktail  30 mL Oral Once  . guaiFENesin  600 mg Oral BID  . hydrochlorothiazide  25 mg Oral Daily  . influenza  inactive virus vaccine  0.5 mL Intramuscular Tomorrow-1000  . insulin aspart  0-4 Units Subcutaneous Q4H  . ipratropium  0.5 mg Nebulization Once  . ipratropium  0.5 mg Nebulization Q6H  . ipratropium  0.5 mg Nebulization STAT  . ipratropium  0.5 mg Nebulization Once  . ipratropium  500 mcg Nebulization Once  . levalbuterol  0.63 mg Nebulization Q6H  . loratadine  10 mg Oral Daily  . magnesium sulfate  1 g Intramuscular Q30 min  . methylPREDNISolone (SOLU-MEDROL) injection  125 mg Intravenous Once  . methylPREDNISolone (SOLU-MEDROL) injection  125 mg Intravenous Q6H  . moxifloxacin  400 mg Intravenous QHS  . olmesartan  40 mg Oral Daily  . pantoprazole  40 mg Oral Daily  . potassium chloride  10 mEq Intravenous Q1 Hr x 4  . sodium chloride  3 mL Intravenous Q12H  . DISCONTD: albuterol  2.5 mg Nebulization Once  . DISCONTD: albuterol  5 mg Nebulization Once  . DISCONTD: enoxaparin  40 mg Subcutaneous Q24H  . DISCONTD: enoxaparin  40 mg Subcutaneous Q24H  . DISCONTD: gi cocktail  30 mL Oral Once  . DISCONTD: insulin aspart  0-20 Units Subcutaneous TID WC  . DISCONTD: insulin aspart  0-4 Units Subcutaneous Q4H  . DISCONTD: insulin aspart  0-5 Units Subcutaneous QHS  . DISCONTD: irbesartan-hydrochlorothiazide  1 tablet Oral Daily  . DISCONTD: methylPREDNISolone (SOLU-MEDROL) injection  60 mg Intravenous Q6H   Continuous Infusions:   . dextrose    . insulin (NOVOLIN-R) infusion    . DISCONTD:  dextrose     PRN Meds:.sodium chloride, alum & mag hydroxide-simeth, diphenhydrAMINE, guaiFENesin-dextromethorphan, hydrALAZINE, levalbuterol, ondansetron (ZOFRAN) IV, ondansetron, oxyCODONE, oxyCODONE, sodium chloride, sodium chloride  Assessment/Plan:  Asthma exacerbation has Hx of severe persistent asthma on treatment with Symbicort high dose, spiriva and levalbuterol-pulmonary has evaluated the patient-we will continue maximal medical therapy today as we are not yet ready to wean steroids-if her wheezing has improved we will consider weaning steroids as of tomorrow  Bronchitis Empiric antibiotic therapy initiated  HTN (hypertension), malignant Blood pressure is reasonably controlled at the present time  GERD (gastroesophageal reflux disease) Continue medical  therapy for this issue-no acute symptoms  Type 2 diabetes mellitus Not surprisingly the patient CBGs have been markedly elevated due to her steroid dosing-I have adjusted her sliding scale insulin  Hypokalemia Resolved with replacement  Microcytic Anemia May very well simply be related to normal menstrual bleeding-we'll follow trend her hospital stay and investigate further during this admission  Tachycardia Do to above-stabilizing  Abnormal EKG The patient is not experiencing chest pain-her risk factor profile is exceedingly limited in regards to risks for coronary artery disease-no further evaluation is felt to be indicated during his hospital  Noncompliance with outpatient followup and possibly medications The extreme importance of consistent followup and compliance with medications in the setting of difficult to manage asthma has been impressed upon this patient in no uncertain terms.   LOS: 1 day   Jammal Sarr T 10/30/2011, 12:04 PM

## 2011-10-31 DIAGNOSIS — J45909 Unspecified asthma, uncomplicated: Secondary | ICD-10-CM

## 2011-10-31 DIAGNOSIS — R0902 Hypoxemia: Secondary | ICD-10-CM

## 2011-10-31 LAB — CBC
HCT: 28.8 % — ABNORMAL LOW (ref 36.0–46.0)
Hemoglobin: 8.3 g/dL — ABNORMAL LOW (ref 12.0–15.0)
MCH: 18.9 pg — ABNORMAL LOW (ref 26.0–34.0)
MCV: 65.6 fL — ABNORMAL LOW (ref 78.0–100.0)
Platelets: 403 10*3/uL — ABNORMAL HIGH (ref 150–400)
RBC: 4.39 MIL/uL (ref 3.87–5.11)
WBC: 15.7 10*3/uL — ABNORMAL HIGH (ref 4.0–10.5)

## 2011-10-31 LAB — GLUCOSE, CAPILLARY
Glucose-Capillary: 227 mg/dL — ABNORMAL HIGH (ref 70–99)
Glucose-Capillary: 355 mg/dL — ABNORMAL HIGH (ref 70–99)

## 2011-10-31 MED ORDER — INSULIN GLARGINE 100 UNIT/ML ~~LOC~~ SOLN
26.0000 [IU] | Freq: Every day | SUBCUTANEOUS | Status: DC
  Administered 2011-10-31: 26 [IU] via SUBCUTANEOUS
  Filled 2011-10-31: qty 3

## 2011-10-31 MED ORDER — METHYLPREDNISOLONE SODIUM SUCC 125 MG IJ SOLR
60.0000 mg | Freq: Four times a day (QID) | INTRAMUSCULAR | Status: DC
  Administered 2011-10-31 – 2011-11-01 (×4): 60 mg via INTRAVENOUS
  Filled 2011-10-31 (×3): qty 0.96
  Filled 2011-10-31: qty 2
  Filled 2011-10-31: qty 0.96
  Filled 2011-10-31: qty 2

## 2011-10-31 MED ORDER — INFLUENZA VIRUS VACC SPLIT PF IM SUSP: 0.5000 mL | INTRAMUSCULAR | Status: DC

## 2011-10-31 NOTE — Progress Notes (Signed)
Subjective: [name] [name] is a 35 y.o. female with a past medical history of Asthma; Type 2 diabetes mellitus; Hypertension; Chronic anemia; Abnormal TSH; Morbid obesity; GERD (gastroesophageal reflux disease); Diabetes mellitus; Seasonal allergies; and Nasal polyps. For the past 3 days the patient has had severe shortness of breath/wheezing.   The patient reports that her breathing has improved yet again today compared to yesterday. She is not yet back to her baseline however. She denies chest pain fevers chills headache nausea or vomiting. She does ask for more freedom to move about the room and to take a shower.  Objective: Weight change:   Intake/Output Summary (Last 24 hours) at 10/31/11 1514 Last data filed at 10/31/11 0900  Gross per 24 hour  Intake    740 ml  Output   1150 ml  Net   -410 ml   Blood pressure 133/92, pulse 106, temperature 98.5 F (36.9 C), temperature source Oral, resp. rate 22, height 5\' 7"  (1.702 m), weight 136.4 kg (300 lb 11.3 oz), last menstrual period 10/04/2011, SpO2 99.00%. Temp:  [97.8 F (36.6 C)-98.9 F (37.2 C)] 98.5 F (36.9 C) (12/02 1200) Pulse Rate:  [94-120] 106  (12/02 0800) Resp:  [19-23] 22  (12/02 0800) BP: (99-133)/(30-92) 133/92 mmHg (12/02 0400) SpO2:  [98 %-100 %] 99 % (12/02 0800)  Physical Exam: General: No acute respiratory distress, able to complete full sentences without pauses Lungs: Clear to auscultation bilaterally with exception to mild expiratory wheezing with no focal crackles-wheezing is less pronounced than yesterday and air movement has improved Cardiovascular: Regular rate and rhythm without murmur gallop or rub normal S1 and S2 Abdomen: Morbidly obese, Nontender, nondistended, soft, bowel sounds positive, no rebound, no ascites, no appreciable mass Extremities: No significant cyanosis, clubbing, or edema bilateral lower extremities  Lab Results:  [facility] 10/30/11 0700 10/29/11 2129 10/29/11 1148 10/29/11 0830  NA  137 137 -- 139  K 4.1 3.3* -- 2.6*  CL 104 102 -- 101  CO2 24 23 -- 28  GLUCOSE 228* 262* -- 143*  BUN 7 7 -- 4*  CREATININE 0.61 0.74 -- 0.80  CALCIUM 8.6 8.9 -- 8.8  MG 2.2 -- 1.6 --  PHOS 2.4 -- -- --    Basename 10/30/11 0700  AST 17  ALT 12  ALKPHOS 75  BILITOT 0.3  PROT 6.5  ALBUMIN 3.5   No results found for this basename: LIPASE:2,AMYLASE:2 in the last 72 hours  Basename 10/31/11 0500 10/30/11 0700 10/29/11 0830  WBC 15.7* 10.5 7.6  NEUTROABS -- -- 4.5  HGB 8.3* 8.3* 8.5*  HCT 28.8* 28.7* 29.1*  MCV 65.6* 65.1* 64.8*  PLT 403* 357 353    Basename 10/30/11 1353 10/30/11 0700 10/30/11 0144  CKTOTAL 186* 210* 223*  CKMB 2.9 2.9 1.7  CKMBINDEX -- -- --  TROPONINI <0.30 <0.30 <0.30   No results found for this basename: POCBNP:3 in the last 72 hours No results found for this basename: DDIMER:2 in the last 72 hours  Basename 10/30/11 0700  HGBA1C 6.2*   No results found for this basename: CHOL:2,HDL:2,LDLCALC:2,TRIG:2,CHOLHDL:2,LDLDIRECT:2 in the last 72 hours  Basename 10/30/11 0700  TSH 0.283*  T4TOTAL --  T3FREE --  THYROIDAB --    Basename 10/30/11 0700  VITAMINB12 347  FOLATE 13.3  FERRITIN 6*  TIBC Not calculated due to Iron <10.  IRON <10*  RETICCTPCT 1.7    Micro Results: Recent Results (from the past 240 hour(s))  MRSA PCR SCREENING     Status: Normal  Collection Time   10/30/11  2:40 AM      Component Value Range Status Comment   MRSA by PCR NEGATIVE  NEGATIVE  Final   CULTURE, SPUTUM-ASSESSMENT     Status: Normal   Collection Time   10/30/11  3:37 AM      Component Value Range Status Comment   Specimen Description SPUTUM   Final    Special Requests Normal   Final    Sputum evaluation     Final    Value: THIS SPECIMEN IS ACCEPTABLE. RESPIRATORY CULTURE REPORT TO FOLLOW.   Report Status 10/30/2011 FINAL   Final   CULTURE, RESPIRATORY     Status: Normal (Preliminary result)   Collection Time   10/30/11  3:37 AM      Component  Value Range Status Comment   Specimen Description SPUTUM   Final    Special Requests NONE   Final    Gram Stain     Final    Value: NO WBC SEEN     RARE SQUAMOUS EPITHELIAL CELLS PRESENT     MODERATE GRAM POSITIVE COCCI IN PAIRS     RARE GRAM POSITIVE RODS     RARE GRAM NEGATIVE RODS   Culture NORMAL OROPHARYNGEAL FLORA   Final    Report Status PENDING   Incomplete     Studies/Results: Scheduled Meds:    . ALPRAZolam  0.25 mg Oral BID  . amLODipine  10 mg Oral Daily  . budesonide-formoterol  2 puff Inhalation BID  . enoxaparin (LOVENOX) injection  70 mg Subcutaneous Q24H  . fluticasone  2 spray Each Nare Daily  . guaiFENesin  600 mg Oral BID  . hydrochlorothiazide  25 mg Oral Daily  . influenza  inactive virus vaccine  0.5 mL Intramuscular Tomorrow-1000  . insulin aspart  0-20 Units Subcutaneous TID WC  . insulin aspart  0-5 Units Subcutaneous QHS  . insulin aspart  6 Units Subcutaneous TID WC  . insulin glargine  20 Units Subcutaneous QHS  . ipratropium  0.5 mg Nebulization Q6H  . levalbuterol  0.63 mg Nebulization Q6H  . loratadine  10 mg Oral Daily  . methylPREDNISolone (SOLU-MEDROL) injection  60 mg Intravenous Q6H  . moxifloxacin  400 mg Intravenous QHS  . olmesartan  40 mg Oral Daily  . pantoprazole  40 mg Oral Daily  . sodium chloride  3 mL Intravenous Q12H  . DISCONTD: methylPREDNISolone (SOLU-MEDROL) injection  125 mg Intravenous Q6H   Continuous Infusions:  PRN Meds:.sodium chloride, alum & mag hydroxide-simeth, diphenhydrAMINE, guaiFENesin-dextromethorphan, hydrALAZINE, levalbuterol, morphine injection, ondansetron (ZOFRAN) IV, ondansetron, oxyCODONE, sodium chloride, sodium chloride  Assessment/Plan:  Asthma exacerbation has Hx of severe persistent asthma on treatment with Symbicort high dose, spiriva and levalbuterol-pulmonary has evaluated the patient-we will continue maximal medical therapy today as we are not yet ready to wean steroids-if her wheezing  continues to improve we will consider weaning steroids tomorrow  Bronchitis Empiric antibiotic therapy initiated  HTN (hypertension), malignant Blood pressure is reasonably controlled at the present time  GERD (gastroesophageal reflux disease) Continue medical therapy for this issue-no acute symptoms  Type 2 diabetes mellitus Not surprisingly the patient CBGs have been markedly elevated due to her steroid dosing-I have further adjusted her sliding scale insulin  Hypokalemia Resolved with replacement  Microcytic Anemia May very well simply be related to normal menstrual bleeding-we will trend her hemoglobin during her hospital stay and investigate further during this admission as indicated  Tachycardia Felt to be related to  steroid dosing as well as acute respiratory distress-stabilizing-telemetry is not felt to be further indicated in this otherwise young female with no cardiac disease  Abnormal EKG The patient is not experiencing chest pain-her risk factor profile is exceedingly limited in regards to risks for coronary artery disease-no further evaluation is felt to be indicated during his hospital  Noncompliance with outpatient followup and possibly medications The extreme importance of consistent followup and compliance with medications in the setting of difficult to manage asthma has been impressed upon this patient in no uncertain terms.   LOS: 2 days   MCCLUNG,JEFFREY T 10/31/2011, 3:14 PM

## 2011-10-31 NOTE — Plan of Care (Signed)
Problem: Phase I Progression Outcomes Goal: Hemodynamically stable Outcome: Progressing Diastolic b/p  = 80's - 90's

## 2011-10-31 NOTE — Progress Notes (Addendum)
Reason for Consult:Asthma exacerbation Referring Physician: Deerica Waszak is an 35 y.o. female.  HPI: Pt is 35 y/o AAF with PMHx of Asthma, DM, HTN, morbid obesity and seasonal allergies, who was admitted to [facility] due to worsening of SOB, cough and wheezing for the last couple of days.  As per the patient and the medical records, She reports that she has been having increase cough with greenish sputum for the last 2 days, associated with increased SOB and wheezing. She has been seen at the pulm clinic and has been on Symbicort 160/4.5 bid, spiriva daily, Levalbuterol Q6h (4 times a day lately due to increased SOB). She was on singulair before but it was discontinued and she has been on benadryl for allergies. The last asthma exacerbation was in June/12 and she is not on chronic steroid treatment. She denies sick contacts. No hemoptysis, chest pain, fevers or chills.  PE Filed Vitals:   10/31/11 0800  BP:   Pulse: 106  Temp:   Resp: 22   Neuro: Alert and oriented, following all commands. HEENT: [place]/AT, PERRL, EOM-I, -LAN and -thyromegally. Hrt: RRR, Nl S1/S2, -M/R/G. Pul: Diffuse end exp wheezes. Abd: Soft, NT, ND and +BS. Ext: -edema and -tenderness.  BMET    Component Value Date/Time   NA 137 10/30/2011 0700   K 4.1 10/30/2011 0700   CL 104 10/30/2011 0700   CO2 24 10/30/2011 0700   GLUCOSE 228* 10/30/2011 0700   BUN 7 10/30/2011 0700   CREATININE 0.61 10/30/2011 0700   CALCIUM 8.6 10/30/2011 0700   GFRNONAA >90 10/30/2011 0700   GFRAA >90 10/30/2011 0700   CBC    Component Value Date/Time   WBC 15.7* 10/31/2011 0500   RBC 4.39 10/31/2011 0500   HGB 8.3* 10/31/2011 0500   HCT 28.8* 10/31/2011 0500   PLT 403* 10/31/2011 0500   MCV 65.6* 10/31/2011 0500   MCH 18.9* 10/31/2011 0500   MCHC 28.8* 10/31/2011 0500   RDW 18.8* 10/31/2011 0500   LYMPHSABS 1.5 10/29/2011 0830   MONOABS 0.4 10/29/2011 0830   EOSABS 1.2* 10/29/2011 0830   BASOSABS 0.0 10/29/2011 0830     Assessment/Plan:  Pt is 35 y/o AAF with PMHx of Asthma, DM, HTN, morbid obesity and seasonal allergies, who was admitted to [facility] due to worsening of SOB, cough and wheezing for the last couple of days.  1. Asthma exacerbation: - Pt has Hx of sever persistent asthma on treatment with Symbicort high dose, spiriva and levalbuterol - Last steroid taper in June 12 - CXR with no focal infiltrates, possible peribronchial thickening.  - CBC with Eos 17% and IgE level of 82.6 (0-180) making her a xolair candidate at the lower dosing if symptoms do not improve at 300 mg q4 wks but will check on improvement with current treatment first.  Will also need an allergy panel done as out patient once off steroids. - Decrease solumedrol to 60 q6 in AM. - Levalbuterol 0.63 Q6h + Q4h PRN SOB. - Ipratropium 0.5mg  Q6h, off spiriva for now. - Resp culture, viral panel including influenza pending. - Cont Claritin - Pt might be a candidate for immunotherapy as outpatient if IGE level is high, will be ordered today. - Agree to replace K and Mg. Follow BMP and Mg, phosp. - Continue Avelox.  Sapphira Harjo 10/31/2011, 11:40 AM

## 2011-10-31 NOTE — Progress Notes (Addendum)
Transfer Note  Ellyn Rubiano [id number] Code Status: full Admission Data: 10/31/2011 7:26 PM  [name] [name] is a 35 y.o. female patient who transferred from 2900, alert  & orientated  X 3, no distress noted.  VSS - Blood pressure 151/92, pulse 105, temperature 98.4 F (36.9 C), temperature source Oral, resp. rate 20, height 5\' 7"  (1.702 m), weight 134.9 kg (297 lb 6.4 oz), last menstrual period 10/04/2011, SpO2 96.00%. c/o shortness of breath with activity, denies pain and no c/o chest pain.  O2:   2-3 l/min per  [place] PRN for SOB NSL:  antecubital right patent and in place with a transparent dsg that's clean dry and intact without redness   Allergies:   Allergies  Allergen Reactions  . Aspirin Free Childs (Acetaminophen) Anaphylaxis  . Ibuprofen Anaphylaxis  . Halls Cough Drops Swelling  . Shellfish Allergy Hives and Swelling     Past Medical History  Diagnosis Date  . Asthma   . Type 2 diabetes mellitus   . Hypertension   . Chronic anemia   . Abnormal TSH   . Morbid obesity   . GERD (gastroesophageal reflux disease)   . Diabetes mellitus   . Seasonal allergies   . Nasal polyps     Tobacco/alcohol: denied/none  Pt orientation to unit, room and routine. SR up x 2, fall risk assessment complete with Patient  verbalizing understanding of risks associated with falls. Pt verbalizes an understanding of how to use the call bell and to call for help before getting out of bed.  Skin, clean-dry- intact without evidence of bruising, or skin tears. No evidence of skin break down noted on exam.Plan of care continue IV steroids, respiratory treatments and increase activity.  Pt from home with family and plans to return home on discharge.     Will cont to treat per MD orders, monitor and assist as needed.   Julien Nordmann [facility], RN 10/31/2011 7:26 PM

## 2011-11-01 LAB — RESPIRATORY VIRUS PANEL
Adenovirus B: NOT DETECTED
Bocavirus: NOT DETECTED
Coronavirus229E: NOT DETECTED
CoronavirusOC43: NOT DETECTED
Metapneumovirus: NOT DETECTED
Parainfluenza 1: NOT DETECTED
Parainfluenza 3: NOT DETECTED
Parainfluenza 4: NOT DETECTED
Respiratory Syncytial Virus A: NOT DETECTED

## 2011-11-01 LAB — CULTURE, RESPIRATORY W GRAM STAIN: Culture: NORMAL

## 2011-11-01 LAB — GLUCOSE, CAPILLARY
Glucose-Capillary: 199 mg/dL — ABNORMAL HIGH (ref 70–99)
Glucose-Capillary: 386 mg/dL — ABNORMAL HIGH (ref 70–99)

## 2011-11-01 MED ORDER — MOXIFLOXACIN HCL 400 MG PO TABS: 400.0000 mg | ORAL_TABLET | Freq: Every day | ORAL | Status: AC

## 2011-11-01 MED ORDER — ALPRAZOLAM 0.25 MG PO TABS: 0.2500 mg | ORAL_TABLET | Freq: Two times a day (BID) | ORAL | Status: AC

## 2011-11-01 MED ORDER — LEVALBUTEROL HCL 0.63 MG/3ML IN NEBU: 0.6300 mg | INHALATION_SOLUTION | RESPIRATORY_TRACT | Status: DC | PRN

## 2011-11-01 MED ORDER — PREDNISONE (PAK) 10 MG PO TABS: 60.0000 mg | ORAL_TABLET | Freq: Two times a day (BID) | ORAL | Status: AC

## 2011-11-01 MED ORDER — BUDESONIDE-FORMOTEROL FUMARATE 160-4.5 MCG/ACT IN AERO: 2.0000 | INHALATION_SPRAY | Freq: Two times a day (BID) | RESPIRATORY_TRACT | Status: DC

## 2011-11-01 NOTE — Progress Notes (Signed)
   CARE MANAGEMENT NOTE 11/01/2011  Patient:  [name] [name],[name] [name]   Account Number:  [id number]  Date Initiated:  11/01/2011  Documentation initiated by:  Onnie Boer  Subjective/Objective Assessment:   PT WAS ADMITTED WITH ASTHMA EXACERBATION     Action/Plan:   PROGRESSION OF CARE AND DISCHARGE PLANNING   Anticipated DC Date:  11/02/2011   Anticipated DC Plan:  HOME/SELF CARE      DC Planning Services  CM consult      Choice offered to / List presented to:             Status of service:  In process, will continue to follow Medicare Important Message given?   (If response is "NO", the following Medicare IM given date fields will be blank) Date Medicare IM given:   Date Additional Medicare IM given:    Discharge Disposition:    Per UR Regulation:  Reviewed for med. necessity/level of care/duration of stay  Comments:  UR COMPLETED 11/01/2011 Onnie Boer, RN, BSN [id number] PT WAS ADMITTED WITH ASTHMA EXACERBATION, PTA PT WAS AT HOME WITH SELF CARE

## 2011-11-01 NOTE — Discharge Summary (Signed)
Patient ID: [name] [name] MRN: [id number] DOB/AGE: Jul 30, 1976 35 y.o.  Admit date: 10/29/2011 Discharge date: 11/01/2011  Primary Care Physician:  No Pcp Pulmonary follow up scheduled with Dr. Marchelle [name] 11/11/11 at 11:30.  Discharge Diagnoses:   Present on Admission:  .Asthma exacerbation *Sputum Culture c/w oropharyngeal flora  .Bronchitis .HTN (hypertension), malignant .Hypokalemia .Anemia .GERD (gastroesophageal reflux disease) .Type 2 diabetes mellitus .Tachycardia .Abnormal EKG    Current Discharge Medication List    START taking these medications   Details  levalbuterol (XOPENEX) 0.63 MG/3ML nebulizer solution Take 3 mLs (0.63 mg total) by nebulization every 2 (two) hours as needed for wheezing or shortness of breath. Qty: 3 mL, Refills: 0    moxifloxacin (AVELOX) 400 MG tablet Take 1 tablet (400 mg total) by mouth daily. Qty: 6 tablet, Refills: 0    predniSONE (STERAPRED UNI-PAK) 10 MG tablet Take 6 tablets (60 mg total) by mouth 2 (two) times daily. Taper: 60 mg twice daily on 12/4 50 mg twice daily on 12/5 40 mg twice daily on 12/6 60 mg. Once daily on 12/7 50 mg. Once daily on 12/8 40 mg once daily on 12/9 30 mg. Once daily on 12/10 20 mg once daily on 12/11 10 mg once daily on 12/12 10 mg once daily on 12/13.  Then Please see Dr. Marchelle [name] [facility])  for further instructions. Qty: 60 tablet, Refills: 0      CONTINUE these medications which have CHANGED   Details  ALPRAZolam (XANAX) 0.25 MG tablet Take 1 tablet (0.25 mg total) by mouth 2 (two) times daily. Qty: 30 tablet, Refills: 0    budesonide-formoterol (SYMBICORT) 160-4.5 MCG/ACT inhaler Inhale 2 puffs into the lungs 2 (two) times daily. Qty: 1 Inhaler, Refills: 0      CONTINUE these medications which have NOT CHANGED   Details  amLODipine (NORVASC) 10 MG tablet Take 10 mg by mouth daily.      diphenhydrAMINE (BENADRYL) 50 MG tablet Take 50 mg by mouth at bedtime as needed. For  allergies     fluticasone (FLONASE) 50 MCG/ACT nasal spray Place 2 sprays into the nose daily.      folic acid (FOLVITE) 400 MCG tablet Take 400 mcg by mouth daily.      ipratropium (ATROVENT) 0.02 % nebulizer solution Take 500 mcg by nebulization once.     irbesartan-hydrochlorothiazide (AVALIDE) 300-25 MG per tablet Take 1 tablet by mouth daily.      IRON PO Take 1 tablet by mouth daily.      loratadine (CLARITIN) 10 MG tablet Take 10 mg by mouth daily.      metFORMIN (GLUCOPHAGE) 1000 MG tablet Take 1,000 mg by mouth 2 (two) times daily with a meal.      Multiple Vitamin (MULTIVITAMIN) capsule Take 1 capsule by mouth daily.      pantoprazole (PROTONIX) 40 MG tablet Take 40 mg by mouth daily.     SALINE NASAL SPRAY NA Place 1 spray into the nose 3 (three) times daily as needed.      tiotropium (SPIRIVA) 18 MCG inhalation capsule Place 18 mcg into inhaler and inhale daily.     albuterol (PROVENTIL) (5 MG/ML) 0.5% nebulizer solution Take 2.5 mg by nebulization once.          Consults:  Pulmonary  Brief H and P:  From the admission note:  For the past 3 days patient has had severe shortness of breath/wheezing she also endorses fevers. She was evaluated for this by emergency department.  She had received nebulizer treatments and IV Sinemet control but continued to wheeze.  Plan at the time of admission:   .Asthma exacerbation - will admit the step down will give IV steroids Xopenex and Atrovent nebulizers continue home inhaler and steroids, Avelox for possible bronchitis. I have spoken to Dr. Delton [name] regards to the patient who will see her in the morning. .Bronchitis - see above Will cover with Avelox  .HTN (hypertension), malignant - severe we'll restart home medications and give hydralazine when necessary  .Hypokalemia - replace and repeat potassium levels, already received magnesium  .Anemia - was supposed to workup as outpatient but never did, will get Hemoccult stool and anemia  panel  .GERD (gastroesophageal reflux disease) - Protonix  .Type 2 diabetes mellitus - sliding scale hold metformin while hospitalized  .Tachycardia - likely related to respiratory distress/albuterol administration currently improved will watch and step down  .Abnormal EKG - nonspecific T wave changes given her history of shortness of breath and family history early onset coronary artery disease will cycle cardiac markers.  Ms. [name] [name] was seen in consultation by Dr. Synetta [name] of [facility], whose impression and recommendations are listed below: Asthma exacerbation:  - Pt has Hx of sever persistent asthma on treatment with Symbicort high dose, spiriva and levalbuterol  - last steroid taper in June/12  - CXR with no focal infiltrates, possible peribronchial thickening.  - CBC with E 17%  - Started on Solumedrol 125 mg/ Q6h. Will taper over the next days to prednisone oral  - Levalbuterol 0.63 Q6h + Q4h PRN SOB  - Ipratropium 0.5mg  Q6h  - Resp culture, viral panel including influenza  - ABG  - Cont Claritin  - Pt might be a candidate for immunotherapy as outpatient if IGE level is high  - Agree to replace K and Mg. Follow BMP and Mg, phosp.  - Continue Avelox  - Pt will be on the ICU monitor overnight and we will cont to follow.  Over the next 48 hours the patient's steroids were slowly decreased, and her breathing improved immensely. Today she is able to ambulate in the hallways without shortness of breath. She requests discharge to home.    Sputum cultures indicate Gram Positive Cocci in pairs.  The patient will be discharged with Avelox for 6 more days for a total antibiotic course of 10 days duration.  Blood Pressure:  As the patient's breathing improved and her stress level decreased her blood pressure stablized and she will be discharged on her previous home medications (Avalide) with the addition of Amlodipine 10 q day.  Anemia:  The patient's hgb is 8.3 with an MCV value of 65.6.   She has no signs of obvious bleeding.  She will be discharged on oral iron supplementation.   Results for [name] [name] (MRN [id number]) as of 11/01/2011 13:45  Ref. Range 10/29/2011 08:30 10/30/2011 07:00  Neutrophils Relative Latest Range: 43-77 % 59   Lymphocytes Relative Latest Range: 12-46 % 20   Monocytes Relative Latest Range: 3-12 % 5   Eosinophils Relative Latest Range: 0-5 % 16 (H)   Basophils Relative Latest Range: 0-1 % 0   Neutrophils Absolute Latest Range: 1.7-7.7 K/uL 4.5   Lymphocytes Absolute Latest Range: 0.7-4.0 K/uL 1.5   Monocytes Absolute Latest Range: 0.1-1.0 K/uL 0.4   Eosinophils Absolute Latest Range: 0.0-0.7 K/uL 1.2 (H)   Basophils Absolute Latest Range: 0.0-0.1 K/uL 0.0   RBC Morphology No range found ELLIPTOCYTES   Smear Review No range found PLATELETS  APPEAR ADEQUATE   RBC. Latest Range: 3.87-5.11 MIL/uL  4.41  Retic Ct Pct Latest Range: 0.4-3.1 %  1.7  Retic Count, Manual Latest Range: 19.0-186.0 K/uL  75.0    Diabetes:  Hgb A1c is 6.2 indicating good outpatient control of her CBGs.  Her inpatient CBGs have been elevated in the 200s secondary to large doses of steroids.  Hypokalemia:  Ms. Papin potassium dropped to a low of 2.6 on November 30. It was last checked on December 1 and was 4.1 after potassium supplementation.  Tachycardia: Ms. Barnard has remained in sinus tach without arrhythmia. This is likely secondary to asthma exacerbation and treatment.  Physical Exam on Discharge: General: Alert, awake, oriented x3, in no acute distress. HEENT: No bruits, no goiter. Heart: Tachycardic with reg, rhythm, without murmurs, rubs, gallops. Lungs:  No wheeze, no crackles, decreased breath sounds. Abdomen: Soft, nontender, nondistended, positive bowel sounds. Extremities: No clubbing cyanosis or edema with positive pedal pulses. Neuro: Grossly intact, nonfocal.  Filed Vitals:   10/31/11 2143 10/31/11 2153 11/01/11 0525 11/01/11 1001  BP: 150/94   148/89   Pulse: 100  91   Temp: 99.1 F (37.3 C)  98.4 F (36.9 C)   TempSrc: Oral  Oral   Resp: 21  20   Height:      Weight:      SpO2: 94% 95% 96% 98%     Intake/Output Summary (Last 24 hours) at 11/01/11 1341 Last data filed at 11/01/11 0600  Gross per 24 hour  Intake    550 ml  Output      0 ml  Net    550 ml    CBC:    Component Value Date/Time   WBC 15.7* 10/31/2011 0500   HGB 8.3* 10/31/2011 0500   HCT 28.8* 10/31/2011 0500   PLT 403* 10/31/2011 0500   MCV 65.6* 10/31/2011 0500   NEUTROABS 4.5 10/29/2011 0830   LYMPHSABS 1.5 10/29/2011 0830   MONOABS 0.4 10/29/2011 0830   EOSABS 1.2* 10/29/2011 0830   BASOSABS 0.0 10/29/2011 0830    Basic Metabolic Panel:    Component Value Date/Time   NA 137 10/30/2011 0700   K 4.1 10/30/2011 0700   CL 104 10/30/2011 0700   CO2 24 10/30/2011 0700   BUN 7 10/30/2011 0700   CREATININE 0.61 10/30/2011 0700   GLUCOSE 228* 10/30/2011 0700   CALCIUM 8.6 10/30/2011 0700    Other Pertinent Lab Results:   SPUTUM Culture. Gram Stain NO WBC SEEN RARE SQUAMOUS EPITHELIAL CELLS PRESENT MODERATE GRAM POSITIVE COCCI IN PAIRS RARE GRAM POSITIVE RODS RARE GRAM NEGATIVE RODS-findings consistent with oropharyngeal flora  Significant Diagnostic Studies:  Dg Chest 2 View  10/29/2011  *RADIOLOGY REPORT*  Clinical Data: Cough, short of breath, dizziness  CHEST - 2 VIEW  Comparison: Portable chest x-ray of 08/28/2011  Findings: No active infiltrate or effusion is seen.  There are prominent perihilar markings with peribronchial thickening which may indicate bronchitis.  The heart is mildly enlarged.  No acute bony abnormality is seen.  IMPRESSION: No pneumonia.  Peribronchial thickening may indicate bronchitis. Stable cardiomegaly.    Disposition and Follow-up: The patient has a followup appointment scheduled with Dr. Marchelle [name] of [facility] on December 13.  At this point she has no PCP, but is interested in obtaining one if she does not move  back to [place] in the near future.  Time spent on Discharge: 45 min  Signed: Algis Downs L 11/01/2011, 1:41 PM  I have personally  examined this patient and reviewed the entire database. I have reviewed the above note, made any necessary editorial changes, and agree with its content.  Cherene Altes, MD [facility]

## 2011-11-03 NOTE — Progress Notes (Signed)
   CARE MANAGEMENT NOTE 11/03/2011  Patient:  [name] [name],[name] [name]   Account Number:  [id number]  Date Initiated:  11/01/2011  Documentation initiated by:  Onnie Boer  Subjective/Objective Assessment:   PT WAS ADMITTED WITH ASTHMA EXACERBATION     Action/Plan:   PROGRESSION OF CARE AND DISCHARGE PLANNING   Anticipated DC Date:  11/02/2011   Anticipated DC Plan:  HOME/SELF CARE      DC Planning Services  CM consult      Choice offered to / List presented to:             Status of service:  Completed, signed off Medicare Important Message given?   (If response is "NO", the following Medicare IM given date fields will be blank) Date Medicare IM given:   Date Additional Medicare IM given:    Discharge Disposition:  HOME/SELF CARE  Per UR Regulation:  Reviewed for med. necessity/level of care/duration of stay  Comments:  UR COMPLETED 11/01/2011 Onnie Boer, RN, BSN 1538 PT WAS ADMITTED WITH ASTHMA EXACERBATION, PTA PT WAS AT HOME WITH SELF CARE  11/03/2011 Onnie Boer, RN, BSN 1507 PT DC'D TO HOME WITH SELF CARE

## 2011-11-11 ENCOUNTER — Inpatient Hospital Stay: Payer: Medicare Other | Admitting: Internal Medicine

## 2011-12-14 ENCOUNTER — Encounter (HOSPITAL_COMMUNITY): Payer: Self-pay | Admitting: Physical Medicine and Rehabilitation

## 2011-12-14 ENCOUNTER — Other Ambulatory Visit: Payer: Self-pay

## 2011-12-14 ENCOUNTER — Emergency Department (HOSPITAL_COMMUNITY): Payer: Medicare Other

## 2011-12-14 ENCOUNTER — Inpatient Hospital Stay (HOSPITAL_COMMUNITY)
Admission: EM | Admit: 2011-12-14 | Discharge: 2011-12-15 | DRG: 203 | Disposition: A | Discharge status: Home / Self Care | Payer: Medicare Other | Attending: Internal Medicine | Admitting: Internal Medicine

## 2011-12-14 DIAGNOSIS — I10 Essential (primary) hypertension: Secondary | ICD-10-CM | POA: Diagnosis present

## 2011-12-14 DIAGNOSIS — E119 Type 2 diabetes mellitus without complications: Secondary | ICD-10-CM | POA: Diagnosis present

## 2011-12-14 DIAGNOSIS — E876 Hypokalemia: Secondary | ICD-10-CM | POA: Diagnosis present

## 2011-12-14 DIAGNOSIS — J45901 Unspecified asthma with (acute) exacerbation: Principal | ICD-10-CM | POA: Diagnosis present

## 2011-12-14 DIAGNOSIS — R0602 Shortness of breath: Secondary | ICD-10-CM

## 2011-12-14 DIAGNOSIS — K219 Gastro-esophageal reflux disease without esophagitis: Secondary | ICD-10-CM | POA: Diagnosis present

## 2011-12-14 HISTORY — DX: Depression, unspecified: F32.A

## 2011-12-14 HISTORY — DX: Major depressive disorder, single episode, unspecified: F32.9

## 2011-12-14 HISTORY — DX: Renal tubulo-interstitial disease, unspecified: N15.9

## 2011-12-14 HISTORY — DX: Reserved for inherently not codable concepts without codable children: IMO0001

## 2011-12-14 HISTORY — DX: Post-traumatic stress disorder, unspecified: F43.10

## 2011-12-14 HISTORY — DX: Encounter for other specified aftercare: Z51.89

## 2011-12-14 HISTORY — DX: Pneumonia, unspecified organism: J18.9

## 2011-12-14 HISTORY — DX: Shortness of breath: R06.02

## 2011-12-14 HISTORY — DX: Sleep apnea, unspecified: G47.30

## 2011-12-14 HISTORY — DX: Unilateral inguinal hernia, without obstruction or gangrene, not specified as recurrent: K40.90

## 2011-12-14 HISTORY — DX: Headache: R51

## 2011-12-14 HISTORY — DX: Anxiety disorder, unspecified: F41.9

## 2011-12-14 LAB — POCT I-STAT 3, ART BLOOD GAS (G3+)
Acid-base deficit: 7 mmol/L — ABNORMAL HIGH (ref 0.0–2.0)
pH, Arterial: 7.361 (ref 7.350–7.400)
pO2, Arterial: 53 mmHg — ABNORMAL LOW (ref 80.0–100.0)

## 2011-12-14 LAB — DIFFERENTIAL
Basophils Relative: 0 % (ref 0–1)
Eosinophils Relative: 0 % (ref 0–5)
Lymphs Abs: 0.6 10*3/uL — ABNORMAL LOW (ref 0.7–4.0)
Monocytes Absolute: 0.1 10*3/uL (ref 0.1–1.0)
Monocytes Relative: 1 % — ABNORMAL LOW (ref 3–12)
Neutro Abs: 10 10*3/uL — ABNORMAL HIGH (ref 1.7–7.7)

## 2011-12-14 LAB — URINE MICROSCOPIC-ADD ON

## 2011-12-14 LAB — URINALYSIS, ROUTINE W REFLEX MICROSCOPIC
Nitrite: NEGATIVE
Protein, ur: NEGATIVE mg/dL
Specific Gravity, Urine: 1.035 — ABNORMAL HIGH (ref 1.005–1.030)
Urobilinogen, UA: 0.2 mg/dL (ref 0.0–1.0)

## 2011-12-14 LAB — CBC
HCT: 33.6 % — ABNORMAL LOW (ref 36.0–46.0)
MCH: 20.4 pg — ABNORMAL LOW (ref 26.0–34.0)
MCV: 67.3 fL — ABNORMAL LOW (ref 78.0–100.0)
Platelets: 349 10*3/uL (ref 150–400)
RBC: 4.99 MIL/uL (ref 3.87–5.11)
WBC: 10.7 10*3/uL — ABNORMAL HIGH (ref 4.0–10.5)

## 2011-12-14 LAB — URINE CULTURE: Colony Count: 60000

## 2011-12-14 LAB — COMPREHENSIVE METABOLIC PANEL
AST: 15 U/L (ref 0–37)
BUN: 7 mg/dL (ref 6–23)
CO2: 16 mEq/L — ABNORMAL LOW (ref 19–32)
Calcium: 9.3 mg/dL (ref 8.4–10.5)
Chloride: 103 mEq/L (ref 96–112)
Creatinine, Ser: 0.77 mg/dL (ref 0.50–1.10)
GFR calc Af Amer: >90 mL/min (ref >90)
GFR calc non Af Amer: >90 mL/min (ref >90)
Glucose, Bld: 340 mg/dL — ABNORMAL HIGH (ref 70–99)
Total Bilirubin: 0.2 mg/dL — ABNORMAL LOW (ref 0.3–1.2)

## 2011-12-14 LAB — GLUCOSE, CAPILLARY

## 2011-12-14 LAB — PRO B NATRIURETIC PEPTIDE: Pro B Natriuretic peptide (BNP): 83.7 pg/mL (ref 0–125)

## 2011-12-14 LAB — POCT I-STAT TROPONIN I: Troponin i, poc: 0 ng/mL (ref 0.00–0.08)

## 2011-12-14 LAB — D-DIMER, QUANTITATIVE: D-Dimer, Quant: 0.49 ug/mL-FEU — ABNORMAL HIGH (ref 0.00–0.48)

## 2011-12-14 MED ORDER — INSULIN ASPART 100 UNIT/ML ~~LOC~~ SOLN
0.0000 [IU] | Freq: Every day | SUBCUTANEOUS | Status: DC
  Administered 2011-12-14: 4 [IU] via SUBCUTANEOUS
  Filled 2011-12-14: qty 3

## 2011-12-14 MED ORDER — METHYLPREDNISOLONE SODIUM SUCC 125 MG IJ SOLR
125.0000 mg | Freq: Four times a day (QID) | INTRAMUSCULAR | Status: DC
  Administered 2011-12-14 (×2): 125 mg via INTRAVENOUS
  Filled 2011-12-14: qty 2

## 2011-12-14 MED ORDER — MAGNESIUM SULFATE 40 MG/ML IJ SOLN
2.0000 g | INTRAMUSCULAR | Status: AC
  Administered 2011-12-14: 2 g via INTRAVENOUS
  Filled 2011-12-14: qty 50

## 2011-12-14 MED ORDER — ALBUTEROL SULFATE (5 MG/ML) 0.5% IN NEBU: 2.5000 mg | INHALATION_SOLUTION | RESPIRATORY_TRACT | Status: DC | PRN

## 2011-12-14 MED ORDER — SALINE NASAL SPRAY 0.65 % NA SOLN: 2.0000 | NASAL | Status: DC | PRN

## 2011-12-14 MED ORDER — IRBESARTAN-HYDROCHLOROTHIAZIDE 300-25 MG PO TABS: 1.0000 | ORAL_TABLET | Freq: Every day | ORAL | Status: DC

## 2011-12-14 MED ORDER — ENOXAPARIN SODIUM 40 MG/0.4ML ~~LOC~~ SOLN
40.0000 mg | SUBCUTANEOUS | Status: DC
  Administered 2011-12-14: 40 mg via SUBCUTANEOUS
  Filled 2011-12-14 (×2): qty 0.4

## 2011-12-14 MED ORDER — HYDROCOD POLST-CHLORPHEN POLST 10-8 MG/5ML PO LQCR
5.0000 mL | Freq: Once | ORAL | Status: AC
  Administered 2011-12-14: 5 mL via ORAL
  Filled 2011-12-14: qty 5

## 2011-12-14 MED ORDER — LEVALBUTEROL HCL 0.63 MG/3ML IN NEBU: 1.0000 | INHALATION_SOLUTION | RESPIRATORY_TRACT | Status: DC | PRN

## 2011-12-14 MED ORDER — IPRATROPIUM BROMIDE 0.02 % IN SOLN
0.5000 mg | RESPIRATORY_TRACT | Status: DC
  Administered 2011-12-14 (×4): 0.5 mg via RESPIRATORY_TRACT
  Filled 2011-12-14 (×5): qty 2.5

## 2011-12-14 MED ORDER — ALBUTEROL (5 MG/ML) CONTINUOUS INHALATION SOLN
10.0000 mg/h | INHALATION_SOLUTION | Freq: Once | RESPIRATORY_TRACT | Status: AC
  Administered 2011-12-14: 10 mg/h via RESPIRATORY_TRACT

## 2011-12-14 MED ORDER — BUDESONIDE-FORMOTEROL FUMARATE 160-4.5 MCG/ACT IN AERO: 2.0000 | INHALATION_SPRAY | Freq: Two times a day (BID) | RESPIRATORY_TRACT | Status: DC

## 2011-12-14 MED ORDER — SALINE SPRAY 0.65 % NA SOLN
2.0000 | NASAL | Status: DC | PRN
  Filled 2011-12-14: qty 44

## 2011-12-14 MED ORDER — FLUTICASONE PROPIONATE 50 MCG/ACT NA SUSP
2.0000 | Freq: Every day | NASAL | Status: DC
  Administered 2011-12-14 – 2011-12-15 (×2): 2 via NASAL
  Filled 2011-12-14: qty 16

## 2011-12-14 MED ORDER — HYDROCHLOROTHIAZIDE 25 MG PO TABS
25.0000 mg | ORAL_TABLET | Freq: Every day | ORAL | Status: DC
  Administered 2011-12-14 – 2011-12-15 (×2): 25 mg via ORAL
  Filled 2011-12-14 (×2): qty 1

## 2011-12-14 MED ORDER — TIOTROPIUM BROMIDE MONOHYDRATE 18 MCG IN CAPS
18.0000 ug | ORAL_CAPSULE | Freq: Every day | RESPIRATORY_TRACT | Status: DC
  Filled 2011-12-14: qty 5

## 2011-12-14 MED ORDER — DIPHENHYDRAMINE HCL 25 MG PO TABS
50.0000 mg | ORAL_TABLET | Freq: Every evening | ORAL | Status: DC | PRN
  Filled 2011-12-14: qty 2

## 2011-12-14 MED ORDER — ALBUTEROL SULFATE (5 MG/ML) 0.5% IN NEBU
5.0000 mg | INHALATION_SOLUTION | RESPIRATORY_TRACT | Status: DC | PRN
  Administered 2011-12-14 (×3): 5 mg via RESPIRATORY_TRACT
  Filled 2011-12-14: qty 2
  Filled 2011-12-14: qty 0.5
  Filled 2011-12-14 (×3): qty 1

## 2011-12-14 MED ORDER — IPRATROPIUM BROMIDE 0.02 % IN SOLN
RESPIRATORY_TRACT | Status: AC
  Administered 2011-12-14: 0.5 mg via RESPIRATORY_TRACT
  Filled 2011-12-14: qty 2.5

## 2011-12-14 MED ORDER — SODIUM CHLORIDE 0.9 % IV SOLN: INTRAVENOUS | Status: DC

## 2011-12-14 MED ORDER — BUDESONIDE-FORMOTEROL FUMARATE 160-4.5 MCG/ACT IN AERO
2.0000 | INHALATION_SPRAY | Freq: Two times a day (BID) | RESPIRATORY_TRACT | Status: DC
  Administered 2011-12-14: 2 via RESPIRATORY_TRACT
  Filled 2011-12-14 (×2): qty 6

## 2011-12-14 MED ORDER — AMLODIPINE BESYLATE 10 MG PO TABS
10.0000 mg | ORAL_TABLET | Freq: Every day | ORAL | Status: DC
  Administered 2011-12-14 – 2011-12-15 (×2): 10 mg via ORAL
  Filled 2011-12-14 (×2): qty 1

## 2011-12-14 MED ORDER — ALBUTEROL SULFATE (5 MG/ML) 0.5% IN NEBU
2.5000 mg | INHALATION_SOLUTION | Freq: Four times a day (QID) | RESPIRATORY_TRACT | Status: DC
  Administered 2011-12-15 (×3): 2.5 mg via RESPIRATORY_TRACT
  Filled 2011-12-14 (×3): qty 0.5

## 2011-12-14 MED ORDER — PREDNISONE 20 MG PO TABS: ORAL_TABLET | ORAL | Status: DC

## 2011-12-14 MED ORDER — ALBUTEROL SULFATE (5 MG/ML) 0.5% IN NEBU
INHALATION_SOLUTION | RESPIRATORY_TRACT | Status: AC
  Administered 2011-12-14: 5 mg via RESPIRATORY_TRACT
  Filled 2011-12-14: qty 2

## 2011-12-14 MED ORDER — OLMESARTAN MEDOXOMIL 40 MG PO TABS
40.0000 mg | ORAL_TABLET | Freq: Every day | ORAL | Status: DC
  Administered 2011-12-14 – 2011-12-15 (×2): 40 mg via ORAL
  Filled 2011-12-14 (×2): qty 1

## 2011-12-14 MED ORDER — INSULIN ASPART 100 UNIT/ML ~~LOC~~ SOLN
0.0000 [IU] | Freq: Three times a day (TID) | SUBCUTANEOUS | Status: DC
  Administered 2011-12-15: 15 [IU] via SUBCUTANEOUS
  Administered 2011-12-15: 7 [IU] via SUBCUTANEOUS
  Filled 2011-12-14: qty 3

## 2011-12-14 MED ORDER — GUAIFENESIN ER 600 MG PO TB12
600.0000 mg | ORAL_TABLET | Freq: Two times a day (BID) | ORAL | Status: DC
  Administered 2011-12-14 – 2011-12-15 (×2): 600 mg via ORAL
  Filled 2011-12-14 (×3): qty 1

## 2011-12-14 MED ORDER — PANTOPRAZOLE SODIUM 40 MG PO TBEC
40.0000 mg | DELAYED_RELEASE_TABLET | Freq: Every day | ORAL | Status: DC
  Administered 2011-12-14 – 2011-12-15 (×2): 40 mg via ORAL
  Filled 2011-12-14 (×4): qty 1

## 2011-12-14 MED ORDER — METFORMIN HCL 500 MG PO TABS
1000.0000 mg | ORAL_TABLET | Freq: Two times a day (BID) | ORAL | Status: DC
  Administered 2011-12-15: 1000 mg via ORAL
  Filled 2011-12-14 (×3): qty 2

## 2011-12-14 MED ORDER — IPRATROPIUM BROMIDE 0.02 % IN SOLN
0.5000 mg | Freq: Four times a day (QID) | RESPIRATORY_TRACT | Status: DC
  Administered 2011-12-14 – 2011-12-15 (×4): 0.5 mg via RESPIRATORY_TRACT
  Filled 2011-12-14 (×3): qty 2.5

## 2011-12-14 MED ORDER — LORATADINE 10 MG PO TABS
10.0000 mg | ORAL_TABLET | Freq: Every day | ORAL | Status: DC
  Administered 2011-12-14 – 2011-12-15 (×2): 10 mg via ORAL
  Filled 2011-12-14 (×2): qty 1

## 2011-12-14 MED ORDER — METHYLPREDNISOLONE SODIUM SUCC 125 MG IJ SOLR
60.0000 mg | Freq: Four times a day (QID) | INTRAMUSCULAR | Status: DC
  Administered 2011-12-14 – 2011-12-15 (×3): 60 mg via INTRAVENOUS
  Filled 2011-12-14 (×2): qty 0.96
  Filled 2011-12-14: qty 2
  Filled 2011-12-14 (×2): qty 0.96

## 2011-12-14 NOTE — ED Notes (Signed)
PT MEDICATED FOR COUGH. SHE HAS EATEN 25% OF HER BREAKFAST.

## 2011-12-14 NOTE — ED Notes (Addendum)
Pt presents to department for evaluation of SOB and audible wheezing. Pt was levaing hospital after being discharged when she became more short of breath. Smells of heavy perfume and states she was trying to freshen up before leaving ED. Audible wheezing noted upon arrival to exam room. Labored breathing. Pt speaking short sentences. She is alert and oriented x4. Denies chest pain. Skin warm and dry.

## 2011-12-14 NOTE — ED Notes (Signed)
Taken to CDU per OK from [place], RN Chg. Nurse and OK from [facility] in CDU after report. Pt slept on way via stretcher to CDU and awakened and walked to bed from hallway w/o SOB.

## 2011-12-14 NOTE — ED Notes (Signed)
[id number] Ready 

## 2011-12-14 NOTE — ED Notes (Signed)
Pt has been ready for discharge for past 20 minutes. She is making phone calls and talking with out sob. No coughing. Pt now smells very heavily of perfume. States she is going shopping at [employer] on her way home

## 2011-12-14 NOTE — ED Provider Notes (Signed)
Medical screening examination/treatment/procedure(s) were conducted as a shared visit with non-physician practitioner(s) and myself.  I personally evaluated the patient during the encounter  Flint Melter, MD 12/14/11 [phone]

## 2011-12-14 NOTE — ED Notes (Signed)
Pt resting quietly at the time. Remains on continuous albuterol nebulizer treatment. Remains on cardiac monitor. Work of breathing decreased since arriving to exam room. Pt denies pain. Will continue to monitor.

## 2011-12-14 NOTE — Progress Notes (Signed)
Observation review is complete. 

## 2011-12-14 NOTE — ED Notes (Signed)
Pt was asked to void for a urine sample. Bedpan was offered, pt denied bedpan. Bedside commode set up for pt used. Pt requested to walk to restroom, advised pt that she is still SOB and wheezing and that a bedside commode is best. When coming back to check on pt's, pt did not have oxygen on, pt stated that the oxygen lining was "messing up my hair". Oxygen placed back on.

## 2011-12-14 NOTE — ED Provider Notes (Signed)
Patient report received from Dr. Effie Shy at 49:17 AM. 36 year old female currently in the CDU under an asthma protocol.   2:50 PM Patient shows marked improvement with current treatment.  She request for medication refills.  Pt agrees to f/u with her PCP for further management.    Fayrene Helper, PA-C 12/14/11 1453

## 2011-12-14 NOTE — ED Provider Notes (Signed)
History     CSN: [id number]  Arrival date & time 12/14/11  1478   First MD Initiated Contact with Patient 12/14/11 [phone]      Chief Complaint  Patient presents with  . Asthma    (Consider location/radiation/quality/duration/timing/severity/associated sxs/prior treatment) HPI [name] [name] is a 36 y.o. female presents with c/o trouble breathing leading to desire to be assessed in the ED. The sx(s) have been present for 4 days and gradually worsening. Additional concerns are cough. Causative factors are asthma. Palliative factors are no relief with home medication. The distress associated is moderate. The disorder has been present for 4 days. Patient presents by EMS, was treated with nebulizer and Solu-Medrol IV.    Past Medical History  Diagnosis Date  . Asthma   . Type 2 diabetes mellitus   . Hypertension   . Chronic anemia   . Abnormal TSH   . Morbid obesity   . GERD (gastroesophageal reflux disease)   . Diabetes mellitus   . Seasonal allergies   . Nasal polyps     No past surgical history on file.  Family History  Problem Relation Age of Onset  . Diabetes Mother   . Heart disease Father     History  Substance Use Topics  . Smoking status: Never Smoker   . Smokeless tobacco: Not on file  . Alcohol Use: No    OB History    Grav Para Term Preterm Abortions TAB SAB Ect Mult Living                  Review of Systems  All other systems reviewed and are negative.    Allergies  Aspirin free childs; Ibuprofen; Halls cough drops; and Shellfish allergy  Home Medications   Current Outpatient Rx  Name Route Sig Dispense Refill  . ALBUTEROL SULFATE (5 MG/ML) 0.5% IN NEBU Nebulization Take 2.5 mg by nebulization once.      Marland Kitchen AMLODIPINE BESYLATE 10 MG PO TABS Oral Take 10 mg by mouth daily.      . BUDESONIDE-FORMOTEROL FUMARATE 160-4.5 MCG/ACT IN AERO Inhalation Inhale 2 puffs into the lungs 2 (two) times daily. 1 Inhaler 0  . DIPHENHYDRAMINE HCL 50 MG PO  TABS Oral Take 50 mg by mouth at bedtime as needed. For allergies     . FLUTICASONE PROPIONATE 50 MCG/ACT NA SUSP Nasal Place 2 sprays into the nose daily.      Marland Kitchen FOLIC ACID 400 MCG PO TABS Oral Take 400 mcg by mouth daily.      . IPRATROPIUM BROMIDE 0.02 % IN SOLN Nebulization Take 500 mcg by nebulization once.     . IRBESARTAN-HYDROCHLOROTHIAZIDE 300-25 MG PO TABS Oral Take 1 tablet by mouth daily.      . IRON PO Oral Take 1 tablet by mouth daily.      Marland Kitchen LEVALBUTEROL HCL 0.63 MG/3ML IN NEBU Nebulization Take 3 mLs (0.63 mg total) by nebulization every 2 (two) hours as needed for wheezing or shortness of breath. 3 mL 0  . LORATADINE 10 MG PO TABS Oral Take 10 mg by mouth daily.      Marland Kitchen METFORMIN HCL 1000 MG PO TABS Oral Take 1,000 mg by mouth 2 (two) times daily with a meal.      . MULTIVITAMINS PO CAPS Oral Take 1 capsule by mouth daily.      Marland Kitchen PANTOPRAZOLE SODIUM 40 MG PO TBEC Oral Take 40 mg by mouth daily.     Marland Kitchen SALINE NASAL SPRAY  NA Nasal Place 1 spray into the nose 3 (three) times daily as needed.      Marland Kitchen TIOTROPIUM BROMIDE MONOHYDRATE 18 MCG IN CAPS Inhalation Place 18 mcg into inhaler and inhale daily.     . BUDESONIDE-FORMOTEROL FUMARATE 160-4.5 MCG/ACT IN AERO Inhalation Inhale 2 puffs into the lungs 2 (two) times daily. 1 Inhaler 12  . LEVALBUTEROL HCL 0.63 MG/3ML IN NEBU Nebulization Take 3 mLs (0.63 mg total) by nebulization every 4 (four) hours as needed for wheezing. 3 mL 12    BP 151/75  Pulse 106  Temp(Src) 98.3 F (36.8 C) (Oral)  Resp 22  SpO2 97%  LMP 12/14/2011  Physical Exam  Nursing note and vitals reviewed. Constitutional: She is oriented to person, place, and time. She appears well-developed and well-nourished.       Morbidly obese  HENT:  Head: Normocephalic and atraumatic.  Eyes: Conjunctivae and EOM are normal. Pupils are equal, round, and reactive to light.  Neck: Normal range of motion and phonation normal. Neck supple.  Cardiovascular: Normal rate,  regular rhythm and intact distal pulses.   Pulmonary/Chest: She is in respiratory distress (mild). She has wheezes. She has no rales. She exhibits no tenderness.  Abdominal: Soft. She exhibits no distension. There is no tenderness. There is no guarding.  Musculoskeletal: Normal range of motion.  Neurological: She is alert and oriented to person, place, and time. She has normal strength. She exhibits normal muscle tone.  Skin: Skin is warm and dry.  Psychiatric: She has a normal mood and affect. Her behavior is normal. Judgment and thought content normal.    ED Course  Procedures (including critical care time) 05:21- placed on CDU asthma protocol.   Labs Reviewed - No data to display No results found.   1. Asthma exacerbation       MDM  Exacerbation Asthma, that requires ongoing management. Pt put on Asthma protocol.          Flint Melter, MD 12/14/11 [phone]

## 2011-12-14 NOTE — ED Notes (Signed)
Pt resting quietly at the time. States she feels much better after nebulizer treatment. Respirations unlabored at the time. Remains on cardiac monitor.

## 2011-12-14 NOTE — H&P (Signed)
PCP:   No Pcp   Chief Complaint:  Shortness of breath and wheezing  HPI: This is a 36 year old female with a history of diabetes type 2, hypertension, morbid obesity and asthma with greater than 6 hospitalizations in the past year including one hospitalization that required intubation this past summer who presents to the emergency department with shortness of breath and wheezing. The patient was treated with nebulizer treatments and Solu-Medrol and was discharged earlier today. She will return within one to 2 hours acutely short of breath. She received magnesium as well as continuous nebulizer treatments with improvement. She's currently on 2 L nasal cannula. She states the symptoms started 2 days ago and has progressively worsened. She admits to dry nonproductive cough, wheezing, nausea, sweats. The patient did put on copious amounts of perfume prior to arrival.  Review of Systems:  The patient denies anorexia, fever, weight loss,, vision loss, decreased hearing, hoarseness, chest pain, syncope, dyspnea on exertion, peripheral edema, balance deficits, hemoptysis, abdominal pain, melena, hematochezia, severe indigestion/heartburn, hematuria, incontinence, genital sores, muscle weakness, suspicious skin lesions, transient blindness, difficulty walking, depression, unusual weight change, abnormal bleeding, enlarged lymph nodes, angioedema, and breast masses.  Past Medical History: Past Medical History  Diagnosis Date  . Asthma   . Type 2 diabetes mellitus   . Hypertension   . Chronic anemia   . Abnormal TSH   . Morbid obesity   . GERD (gastroesophageal reflux disease)   . Diabetes mellitus   . Seasonal allergies   . Nasal polyps    No past surgical history on file.  Medications: Prior to Admission medications   Medication Sig Start Date End Date Taking? Authorizing Provider  albuterol (PROVENTIL) (5 MG/ML) 0.5% nebulizer solution Take 2.5 mg by nebulization once.     Yes Historical  Provider, MD  amLODipine (NORVASC) 10 MG tablet Take 10 mg by mouth daily.     Yes Historical Provider, MD  budesonide-formoterol (SYMBICORT) 160-4.5 MCG/ACT inhaler Inhale 2 puffs into the lungs 2 (two) times daily. 11/01/11 10/31/12 Yes Marianne L York, PA  diphenhydrAMINE (BENADRYL) 50 MG tablet Take 50 mg by mouth at bedtime as needed. For allergies    Yes Historical Provider, MD  fluticasone (FLONASE) 50 MCG/ACT nasal spray Place 2 sprays into the nose daily.     Yes Historical Provider, MD  folic acid (FOLVITE) 400 MCG tablet Take 400 mcg by mouth daily.     Yes Historical Provider, MD  ipratropium (ATROVENT) 0.02 % nebulizer solution Take 500 mcg by nebulization once.    Yes Historical Provider, MD  irbesartan-hydrochlorothiazide (AVALIDE) 300-25 MG per tablet Take 1 tablet by mouth daily.     Yes Historical Provider, MD  IRON PO Take 1 tablet by mouth daily.     Yes Historical Provider, MD  levalbuterol (XOPENEX) 0.63 MG/3ML nebulizer solution Take 3 mLs (0.63 mg total) by nebulization every 2 (two) hours as needed for wheezing or shortness of breath. 11/01/11 10/31/12 Yes Marianne L York, PA  loratadine (CLARITIN) 10 MG tablet Take 10 mg by mouth daily.     Yes Historical Provider, MD  metFORMIN (GLUCOPHAGE) 1000 MG tablet Take 1,000 mg by mouth 2 (two) times daily with a meal.     Yes Historical Provider, MD  Multiple Vitamin (MULTIVITAMIN) capsule Take 1 capsule by mouth daily.     Yes Historical Provider, MD  pantoprazole (PROTONIX) 40 MG tablet Take 40 mg by mouth daily.    Yes Historical Provider, MD  SALINE NASAL  SPRAY NA Place 1 spray into the nose 3 (three) times daily as needed.     Yes Historical Provider, MD  tiotropium (SPIRIVA) 18 MCG inhalation capsule Place 18 mcg into inhaler and inhale daily.    Yes Historical Provider, MD  budesonide-formoterol (SYMBICORT) 160-4.5 MCG/ACT inhaler Inhale 2 puffs into the lungs 2 (two) times daily. 12/14/11 12/13/12  Fayrene Helper, PA-C    levalbuterol (XOPENEX) 0.63 MG/3ML nebulizer solution Take 3 mLs (0.63 mg total) by nebulization every 4 (four) hours as needed for wheezing. 12/14/11 12/13/12  Fayrene Helper, PA-C  predniSONE (DELTASONE) 20 MG tablet 3 Tabs PO Days 1-3, then 2 tabs PO Days 4-6, then 1 tab PO Day 7-9, then Half Tab PO Day 10-12 12/14/11 12/24/11  Fayrene Helper, PA-C    Allergies:   Allergies  Allergen Reactions  . Aspirin Free Childs (Acetaminophen) Anaphylaxis  . Ibuprofen Anaphylaxis  . Halls Cough Drops Swelling  . Shellfish Allergy Hives and Swelling    Social History:  reports that she has never smoked. She does not have any smokeless tobacco history on file. She reports that she does not drink alcohol or use illicit drugs.  Family History: Family History  Problem Relation Age of Onset  . Diabetes Mother   . Heart disease Father     Physical Exam: Filed Vitals:   12/14/11 1630 12/14/11 1654 12/14/11 1745 12/14/11 1913  BP: 156/109  171/99 141/80  Pulse: 129  125 126  Temp:      TempSrc:      Resp: 28  24 23   SpO2: 93% 96% 100% 97%   General appearance: alert, cooperative, mild distress and tachypnic Head: Normocephalic, without obvious abnormality, atraumatic Eyes: conjunctivae/corneas clear. PERRL, EOM's intact. Fundi benign. Ears: normal TM's and external ear canals both ears Throat: lips, mucosa, and tongue normal; teeth and gums normal Resp: wheezes bilaterally Cardio: regular rate and rhythm, S1, S2 normal, no murmur, click, rub or gallop GI: soft, non-tender; bowel sounds normal; no masses,  no organomegaly Extremities: extremities normal, atraumatic, no cyanosis or edema Skin: Skin color, texture, turgor normal. No rashes or lesions Neurologic: Grossly normal   Labs on Admission:   [facility] 12/14/11 1629  NA 139  K 3.4*  CL 103  CO2 16*  GLUCOSE 340*  BUN 7  CREATININE 0.77  CALCIUM 9.3  MG --  PHOS --    Basename 12/14/11 1629  AST 15  ALT 12  ALKPHOS 91  BILITOT  0.2*  PROT 7.1  ALBUMIN 3.6   No results found for this basename: LIPASE:2,AMYLASE:2 in the last 72 hours  Basename 12/14/11 1629  WBC 10.7*  NEUTROABS 10.0*  HGB 10.2*  HCT 33.6*  MCV 67.3*  PLT 349   No results found for this basename: CKTOTAL:3,CKMB:3,CKMBINDEX:3,TROPONINI:3 in the last 72 hours No results found for this basename: TSH,T4TOTAL,FREET3,T3FREE,THYROIDAB in the last 72 hours No results found for this basename: VITAMINB12:2,FOLATE:2,FERRITIN:2,TIBC:2,IRON:2,RETICCTPCT:2 in the last 72 hours  Radiological Exams on Admission: Dg Chest Port 1 View  12/14/2011  *RADIOLOGY REPORT*  Clinical Data: Shortness of breath, asthma attack  PORTABLE CHEST - 1 VIEW  Comparison: 10/29/2011  Findings: Cardiac leads are in place.  Mild prominence of the cardiomediastinal silhouette is noted without edema.  No focal pulmonary opacity.  No pleural effusion.  IMPRESSION: Borderline cardiomegaly without focal acute finding.  Original Report Authenticated By: Harrel Lemon, M.D.    Assessment/Plan Present on Admission:  .Asthma exacerbation .HTN (hypertension), malignant .Type 2 diabetes mellitus  We will admit the patient to telemetry. The patient appears stable enough to be admitted to telemetry floor. We will continue with the patient's IV steroids, as well as nebulizer treatments of albuterol and Atrovent. As the patient does not have any rales I will not treat the patient for bronchitis or pneumonia with antibiotics. We will continue her home inhalers including Symbicort and Spiriva. We'll continue her home diabetic medications and her antihypertensives. Due to steroid use we will cover her with sliding scale insulin. She is a full code. We'll start Lovenox for DVT prophylaxis per  STINSON, JACOB JEHIEL 12/14/2011, 7:32 PM

## 2011-12-14 NOTE — ED Notes (Signed)
NIGHT NURSE DID NOT RESOLVE MEDICATIONS BEFORE LEAVING SHIFT. SOLUMEDROL DOSE RESCHEDULED.

## 2011-12-14 NOTE — ED Notes (Signed)
Placed call for diet tray 

## 2011-12-14 NOTE — ED Notes (Signed)
Regular breakfast tray ordered.  

## 2011-12-14 NOTE — ED Notes (Signed)
IV team at the bedside attempting to gain access. Respiratory also at bedside to administer hour long nebulizer. Pt remains on cardiac monitor.

## 2011-12-14 NOTE — ED Notes (Signed)
ER Doctor in to examine . Pt still has exp. Wheezing on auscultation but stated that she feels better.

## 2011-12-14 NOTE — ED Provider Notes (Signed)
History     CSN: [id number]  Arrival date & time 12/14/11  1191   First MD Initiated Contact with Patient 12/14/11 [phone]      Chief Complaint  Patient presents with  . Asthma    (Consider location/radiation/quality/duration/timing/severity/associated sxs/prior treatment) Patient is a 36 y.o. female presenting with asthma. The history is provided by the patient and medical records. No language interpreter was used.  Asthma This is a recurrent problem. The current episode started less than 1 hour ago. Episode frequency: The patient is a 36 year old woman with asthma. She been seen earlier today and placed in the clinical decision unit for asthma treatment. She had been released from the unit, and was going to go home when she had recurrence of shortness of breath and whee. The problem has been rapidly worsening. Associated symptoms include shortness of breath. The symptoms are aggravated by walking. The symptoms are relieved by nothing (She had received Solu-Medrol and albuterol and Atrovent nebulizer treatments, with transient improvement.).    Past Medical History  Diagnosis Date  . Asthma   . Type 2 diabetes mellitus   . Hypertension   . Chronic anemia   . Abnormal TSH   . Morbid obesity   . GERD (gastroesophageal reflux disease)   . Diabetes mellitus   . Seasonal allergies   . Nasal polyps     No past surgical history on file.  Family History  Problem Relation Age of Onset  . Diabetes Mother   . Heart disease Father     History  Substance Use Topics  . Smoking status: Never Smoker   . Smokeless tobacco: Not on file  . Alcohol Use: No    OB History    Grav Para Term Preterm Abortions TAB SAB Ect Mult Living                  Review of Systems  Constitutional: Negative for fever and chills.  HENT: Negative.   Eyes: Negative.   Respiratory: Positive for shortness of breath and wheezing.   Cardiovascular: Negative.   Genitourinary: Negative.     Musculoskeletal: Negative.   Skin: Negative.   Neurological: Negative.   Psychiatric/Behavioral: Negative.     Allergies  Aspirin free childs; Ibuprofen; Halls cough drops; and Shellfish allergy  Home Medications   Current Outpatient Rx  Name Route Sig Dispense Refill  . ALBUTEROL SULFATE (5 MG/ML) 0.5% IN NEBU Nebulization Take 2.5 mg by nebulization once.      Marland Kitchen AMLODIPINE BESYLATE 10 MG PO TABS Oral Take 10 mg by mouth daily.      . BUDESONIDE-FORMOTEROL FUMARATE 160-4.5 MCG/ACT IN AERO Inhalation Inhale 2 puffs into the lungs 2 (two) times daily. 1 Inhaler 0  . DIPHENHYDRAMINE HCL 50 MG PO TABS Oral Take 50 mg by mouth at bedtime as needed. For allergies     . FLUTICASONE PROPIONATE 50 MCG/ACT NA SUSP Nasal Place 2 sprays into the nose daily.      Marland Kitchen FOLIC ACID 400 MCG PO TABS Oral Take 400 mcg by mouth daily.      . IPRATROPIUM BROMIDE 0.02 % IN SOLN Nebulization Take 500 mcg by nebulization once.     . IRBESARTAN-HYDROCHLOROTHIAZIDE 300-25 MG PO TABS Oral Take 1 tablet by mouth daily.      . IRON PO Oral Take 1 tablet by mouth daily.      Marland Kitchen LEVALBUTEROL HCL 0.63 MG/3ML IN NEBU Nebulization Take 3 mLs (0.63 mg total) by nebulization every 2 (two)  hours as needed for wheezing or shortness of breath. 3 mL 0  . LORATADINE 10 MG PO TABS Oral Take 10 mg by mouth daily.      Marland Kitchen METFORMIN HCL 1000 MG PO TABS Oral Take 1,000 mg by mouth 2 (two) times daily with a meal.      . MULTIVITAMINS PO CAPS Oral Take 1 capsule by mouth daily.      Marland Kitchen PANTOPRAZOLE SODIUM 40 MG PO TBEC Oral Take 40 mg by mouth daily.     Marland Kitchen SALINE NASAL SPRAY NA Nasal Place 1 spray into the nose 3 (three) times daily as needed.      Marland Kitchen TIOTROPIUM BROMIDE MONOHYDRATE 18 MCG IN CAPS Inhalation Place 18 mcg into inhaler and inhale daily.     . BUDESONIDE-FORMOTEROL FUMARATE 160-4.5 MCG/ACT IN AERO Inhalation Inhale 2 puffs into the lungs 2 (two) times daily. 1 Inhaler 12  . LEVALBUTEROL HCL 0.63 MG/3ML IN NEBU Nebulization  Take 3 mLs (0.63 mg total) by nebulization every 4 (four) hours as needed for wheezing. 3 mL 12  . PREDNISONE 20 MG PO TABS  3 Tabs PO Days 1-3, then 2 tabs PO Days 4-6, then 1 tab PO Day 7-9, then Half Tab PO Day 10-12 20 tablet 0    BP 226/124  Pulse 116  Temp(Src) 97.8 F (36.6 C) (Oral)  Resp 20  SpO2 97%  LMP 12/14/2011  Physical Exam  Constitutional: She is oriented to person, place, and time.       Morbidly obese woman who appears short of breath, tachypneic.  HENT:  Head: Normocephalic and atraumatic.  Right Ear: External ear normal.  Left Ear: External ear normal.  Mouth/Throat: Oropharynx is clear and moist.  Eyes: Conjunctivae and EOM are normal. Pupils are equal, round, and reactive to light.  Neck: Normal range of motion. Neck supple.  Cardiovascular: Normal rate, regular rhythm and normal heart sounds.   Pulmonary/Chest: She has wheezes.  Abdominal: Soft. Bowel sounds are normal.  Musculoskeletal: Normal range of motion.  Neurological: She is alert and oriented to person, place, and time.       No sensory or motor deficit.  Skin: Skin is warm and dry.  Psychiatric: She has a normal mood and affect. Her behavior is normal.    ED Course  Procedures (including critical care time)   Labs Reviewed  CBC  DIFFERENTIAL  COMPREHENSIVE METABOLIC PANEL  URINALYSIS, ROUTINE W REFLEX MICROSCOPIC  URINE CULTURE  PREGNANCY, URINE  D-DIMER, QUANTITATIVE  PRO B NATRIURETIC PEPTIDE  I-STAT TROPONIN I  BLOOD GAS, ARTERIAL    4:38 PM Patient was seen and had physical examination. Laboratory tests ABGs and chest x-ray were ordered. A continuous albuterol nebulizer treatment and IV magnesium sulfate were ordered. Weekend her laboratory tests back I anticipate that she will require admission.  4:42 PM  Date: 12/14/2011  Rate: 128  Rhythm: sinus tachycardia  QRS Axis: normal  Intervals: normal  ST/T Wave abnormalities: normal  Conduction Disutrbances:none  Narrative  Interpretation: Sinus tachycardia  Old EKG Reviewed: unchanged  4:49 PM ABGs showed pH 7.361, pCO2 of 30.8, pO2 of 53, and HCO3 of 17.4 with o2 sat of 86%.  Hypoxemic.   6:52 PM Still somewhat wheezy, but doing better.  Blood sugar elevated, probably result of steroids.  Will call internal medicine to admit her for her asthma attack.  Results for orders placed during the hospital encounter of 12/14/11  CBC      Component Value Range  WBC 10.7 (*) 4.0 - 10.5 (K/uL)   RBC 4.99  3.87 - 5.11 (MIL/uL)   Hemoglobin 10.2 (*) 12.0 - 15.0 (g/dL)   HCT 78.2 (*) 95.6 - 46.0 (%)   MCV 67.3 (*) 78.0 - 100.0 (fL)   MCH 20.4 (*) 26.0 - 34.0 (pg)   MCHC 30.4  30.0 - 36.0 (g/dL)   RDW 21.3 (*) 08.6 - 15.5 (%)   Platelets 349  150 - 400 (K/uL)  DIFFERENTIAL      Component Value Range   Neutrophils Relative 93 (*) 43 - 77 (%)   Lymphocytes Relative 6 (*) 12 - 46 (%)   Monocytes Relative 1 (*) 3 - 12 (%)   Eosinophils Relative 0  0 - 5 (%)   Basophils Relative 0  0 - 1 (%)   Neutro Abs 10.0 (*) 1.7 - 7.7 (K/uL)   Lymphs Abs 0.6 (*) 0.7 - 4.0 (K/uL)   Monocytes Absolute 0.1  0.1 - 1.0 (K/uL)   Eosinophils Absolute 0.0  0.0 - 0.7 (K/uL)   Basophils Absolute 0.0  0.0 - 0.1 (K/uL)   Smear Review LARGE PLATELETS PRESENT    COMPREHENSIVE METABOLIC PANEL      Component Value Range   Sodium 139  135 - 145 (mEq/L)   Potassium 3.4 (*) 3.5 - 5.1 (mEq/L)   Chloride 103  96 - 112 (mEq/L)   CO2 16 (*) 19 - 32 (mEq/L)   Glucose, Bld 340 (*) 70 - 99 (mg/dL)   BUN 7  6 - 23 (mg/dL)   Creatinine, Ser 5.78  0.50 - 1.10 (mg/dL)   Calcium 9.3  8.4 - 46.9 (mg/dL)   Total Protein 7.1  6.0 - 8.3 (g/dL)   Albumin 3.6  3.5 - 5.2 (g/dL)   AST 15  0 - 37 (U/L)   ALT 12  0 - 35 (U/L)   Alkaline Phosphatase 91  39 - 117 (U/L)   Total Bilirubin 0.2 (*) 0.3 - 1.2 (mg/dL)   GFR calc non Af Amer >90  >90 (mL/min)   GFR calc Af Amer >90  >90 (mL/min)  D-DIMER, QUANTITATIVE      Component Value Range   D-Dimer, Quant  0.49 (*) 0.00 - 0.48 (ug/mL-FEU)  PRO B NATRIURETIC PEPTIDE      Component Value Range   Pro B Natriuretic peptide (BNP) 83.7  0 - 125 (pg/mL)  POCT I-STAT 3, BLOOD GAS (G3+)      Component Value Range   pH, Arterial 7.361  7.350 - 7.400    pCO2 arterial 30.8 (*) 35.0 - 45.0 (mmHg)   pO2, Arterial 53.0 (*) 80.0 - 100.0 (mmHg)   Bicarbonate 17.4 (*) 20.0 - 24.0 (mEq/L)   TCO2 18  0 - 100 (mmol/L)   O2 Saturation 86.0     Acid-base deficit 7.0 (*) 0.0 - 2.0 (mmol/L)   Collection site RADIAL, ALLEN'S TEST ACCEPTABLE     Drawn by Operator     Sample type ARTERIAL    POCT I-STAT TROPONIN I      Component Value Range   Troponin i, poc 0.00  0.00 - 0.08 (ng/mL)   Comment 3            Dg Chest Port 1 View  12/14/2011  *RADIOLOGY REPORT*  Clinical Data: Shortness of breath, asthma attack  PORTABLE CHEST - 1 VIEW  Comparison: 10/29/2011  Findings: Cardiac leads are in place.  Mild prominence of the cardiomediastinal silhouette is noted without edema.  No  focal pulmonary opacity.  No pleural effusion.  IMPRESSION: Borderline cardiomegaly without focal acute finding.  Original Report Authenticated By: Harrel Lemon, M.D.      7:09 PM Admit to a telemetry bed to [facility] Team 2, Dr. Mahala Menghini.     1. Asthma exacerbation              [name], MD 12/14/11 1910

## 2011-12-14 NOTE — ED Notes (Signed)
LUNCH ORDERED 

## 2011-12-14 NOTE — ED Notes (Signed)
resp at bedside to do breathing tx.

## 2011-12-14 NOTE — ED Notes (Signed)
Admitting MD at bedside, pt awaiting inpt beds assignment.  

## 2011-12-14 NOTE — ED Notes (Signed)
Pat started having mild breathing problems at home last night that got worse even after 2 home neb txs. Audible wheezing heard at present and on 4th neb tx. VS elevated.

## 2011-12-15 DIAGNOSIS — E876 Hypokalemia: Secondary | ICD-10-CM | POA: Diagnosis present

## 2011-12-15 LAB — BASIC METABOLIC PANEL
BUN: 7 mg/dL (ref 6–23)
Chloride: 104 mEq/L (ref 96–112)
GFR calc Af Amer: >90 mL/min (ref >90)
GFR calc non Af Amer: >90 mL/min (ref >90)
Potassium: 3.2 mEq/L — ABNORMAL LOW (ref 3.5–5.1)
Sodium: 139 mEq/L (ref 135–145)

## 2011-12-15 LAB — CBC
MCHC: 30.4 g/dL (ref 30.0–36.0)
Platelets: 348 10*3/uL (ref 150–400)
RDW: 22.8 % — ABNORMAL HIGH (ref 11.5–15.5)
WBC: 18.4 10*3/uL — ABNORMAL HIGH (ref 4.0–10.5)

## 2011-12-15 LAB — MRSA PCR SCREENING: MRSA by PCR: POSITIVE — AB

## 2011-12-15 MED ORDER — GUAIFENESIN-DM 100-10 MG/5ML PO SYRP
5.0000 mL | ORAL_SOLUTION | ORAL | Status: DC | PRN
  Administered 2011-12-15: 5 mL via ORAL
  Filled 2011-12-15: qty 5

## 2011-12-15 MED ORDER — CHLORHEXIDINE GLUCONATE 0.12 % MT SOLN
15.0000 mL | Freq: Two times a day (BID) | OROMUCOSAL | Status: DC
  Administered 2011-12-15: 15 mL via OROMUCOSAL
  Filled 2011-12-15 (×3): qty 15

## 2011-12-15 MED ORDER — PREDNISONE 10 MG PO TABS: ORAL_TABLET | ORAL | Status: DC

## 2011-12-15 MED ORDER — MUPIROCIN 2 % EX OINT
1.0000 "application " | TOPICAL_OINTMENT | Freq: Two times a day (BID) | CUTANEOUS | Status: DC
  Filled 2011-12-15 (×2): qty 22

## 2011-12-15 MED ORDER — BIOTENE DRY MOUTH MT LIQD
15.0000 mL | Freq: Two times a day (BID) | OROMUCOSAL | Status: DC
  Administered 2011-12-15: 15 mL via OROMUCOSAL

## 2011-12-15 MED ORDER — BUDESONIDE-FORMOTEROL FUMARATE 160-4.5 MCG/ACT IN AERO: 2.0000 | INHALATION_SPRAY | Freq: Two times a day (BID) | RESPIRATORY_TRACT | Status: DC

## 2011-12-15 MED ORDER — CHLORHEXIDINE GLUCONATE CLOTH 2 % EX PADS
6.0000 | MEDICATED_PAD | Freq: Every day | CUTANEOUS | Status: DC
  Administered 2011-12-15: 6 via TOPICAL

## 2011-12-15 NOTE — Progress Notes (Signed)
Admitted patient from emergency room,arrived via stretcher.Patient is admitted for SOB,asthma excerbation.Patient has labored breathing on arrival,speaking in short sentences,oxygen in use.Patient is alert and oriented x4,oriented to unit routines.Placed patient on telemetry.Call bell within reach. Shekera Beavers Joselita,RN

## 2011-12-15 NOTE — Progress Notes (Signed)
CRITICAL VALUE ALERT  Critical value received:  (+) POSITIVE MRSA PCR  Date of notification:   12/15/2011  Time of notification:  02:28  Critical value read back:yes  Nurse who received alert:  Dorna Bloom  MD notified (1st page):  K.Kirby,NP  Time of first page:  05:20  MD notified (2nd page):  Time of second page:  Responding MD:  Text page sent to K.Kirby,NP.(+)MRSA  PCR order set initiated per protocol.  Time MD responded:

## 2011-12-15 NOTE — Progress Notes (Signed)
12/15/2011   3:13 PM Pt AVS and d/c instructions discussed and went over with patient.  Pt verbalized understanding of instructions, education, and follow up arrangements, and stated that she did not have any questions.  IV removed and telemetry discontinued.  Pt wheeled downstairs with belongings via wheelchair where she took a taxi home.  Eunice Blase

## 2011-12-15 NOTE — Discharge Summary (Signed)
Admit date: 12/14/2011 Discharge date: 12/15/2011  Primary Care Physician:  No Pcp   Discharge Diagnoses:   Active Hospital Problems  Diagnoses Date Noted   . HTN (hypertension), malignant 10/29/2011   . Type 2 diabetes mellitus      Resolved Hospital Problems  Diagnoses Date Noted Date Resolved  . Asthma exacerbation 10/29/2011 12/15/2011     DISCHARGE MEDICATION: Current Discharge Medication List    START taking these medications   Details  predniSONE (DELTASONE) 10 MG tablet Takes 6 tablets for 1 days, then 5 tablets for 1 days, then 4 tablets for 1 days, then 3 tablets for 1 days, then 1 tabs for 2 days, then 1 tab for 1 days, and then stop. Qty: 21 tablet, Refills: 0      CONTINUE these medications which have CHANGED   Details  budesonide-formoterol (SYMBICORT) 160-4.5 MCG/ACT inhaler Inhale 2 puffs into the lungs 2 (two) times daily. Qty: 1 Inhaler, Refills: 12    levalbuterol (XOPENEX) 0.63 MG/3ML nebulizer solution Take 3 mLs (0.63 mg total) by nebulization every 4 (four) hours as needed for wheezing. Qty: 3 mL, Refills: 12      CONTINUE these medications which have NOT CHANGED   Details  albuterol (PROVENTIL) (5 MG/ML) 0.5% nebulizer solution Take 2.5 mg by nebulization once.      amLODipine (NORVASC) 10 MG tablet Take 10 mg by mouth daily.      diphenhydrAMINE (BENADRYL) 50 MG tablet Take 50 mg by mouth at bedtime as needed. For allergies     fluticasone (FLONASE) 50 MCG/ACT nasal spray Place 2 sprays into the nose daily.      folic acid (FOLVITE) 400 MCG tablet Take 400 mcg by mouth daily.      irbesartan-hydrochlorothiazide (AVALIDE) 300-25 MG per tablet Take 1 tablet by mouth daily.      IRON PO Take 1 tablet by mouth daily.      loratadine (CLARITIN) 10 MG tablet Take 10 mg by mouth daily.      metFORMIN (GLUCOPHAGE) 1000 MG tablet Take 1,000 mg by mouth 2 (two) times daily with a meal.      Multiple Vitamin (MULTIVITAMIN) capsule Take 1 capsule by  mouth daily.      pantoprazole (PROTONIX) 40 MG tablet Take 40 mg by mouth daily.     SALINE NASAL SPRAY NA Place 1 spray into the nose 3 (three) times daily as needed.      tiotropium (SPIRIVA) 18 MCG inhalation capsule Place 18 mcg into inhaler and inhale daily.       STOP taking these medications     ipratropium (ATROVENT) 0.02 % nebulizer solution            Consults:   None  SIGNIFICANT DIAGNOSTIC STUDIES:  Dg Chest Port 1 View  12/14/2011  *RADIOLOGY REPORT*  Clinical Data: Shortness of breath, asthma attack  PORTABLE CHEST - 1 VIEW  Comparison: 10/29/2011  Findings: Cardiac leads are in place.  Mild prominence of the cardiomediastinal silhouette is noted without edema.  No focal pulmonary opacity.  No pleural effusion.  IMPRESSION: Borderline cardiomegaly without focal acute finding.  Original Report Authenticated By: Harrel Lemon, M.D.      Recent Results (from the past 240 hour(s))  MRSA PCR SCREENING     Status: Abnormal   Collection Time   12/14/11  9:17 PM      Component Value Range Status Comment   MRSA by PCR POSITIVE (*) NEGATIVE  Final  BRIEF ADMITTING H & P: This is a 36 year old female with a history of diabetes type 2, hypertension, morbid obesity and asthma with greater than 6 hospitalizations in the past year including one hospitalization that required intubation this past summer who presents to the emergency department with shortness of breath and wheezing. The patient was treated with nebulizer treatments and Solu-Medrol and was discharged earlier today. She will return within one to 2 hours acutely short of breath. She received magnesium as well as continuous nebulizer treatments with improvement. She's currently on 2 L nasal cannula. She states the symptoms started 2 days ago and has progressively worsened. She admits to dry nonproductive cough, wheezing, nausea, sweats. The patient did put on copious amounts of perfume prior to  arrival.   Active Hospital Problems  Diagnoses Date Noted   . HTN (hypertension), malignant: High on admission probably secondarily to Dyspnea now controlled no changes were made to her medication. 10/29/2011   . Type 2 diabetes mellitus: continue home meds.       Resolved Hospital Problems  Diagnoses Date Noted Date Resolved   Hypokalemia; 2/2 to albuterol.    . Asthma exacerbation: No signs of infection. Afebrile WBC on admission WNL increased after steroids started. Will continue steroid tapered at home. With inhalers. 10/29/2011 12/15/2011   Leukocytosis: 2/2 to steroids, patient has remained afebrile.       Disposition and Follow-up:  Discharge Orders    Future Orders Please Complete By Expires   Diet - low sodium heart healthy      Increase activity slowly        Follow-up Information    Follow up with [facility] in 2 weeks. (If symptoms worsen)    Contact information:   [address] [place] [address] [phone]      Follow up with No Pcp .          DISCHARGE EXAM:  General: Alert, awake, oriented x3, in no acute distress. Morbidly obese. HEENT: No bruits, no goiter.  Heart: Regular rate and rhythm, without murmurs, rubs, gallops.  Lungs:Good air movement CTA b/L. Abdomen: Soft, nontender, nondistended, positive bowel sounds.  Neuro: Grossly intact, nonfocal.  Blood pressure 124/81, pulse 108, temperature 97.8 F (36.6 C), temperature source Oral, resp. rate 20, height 5\' 6"  (1.676 m), weight 133 kg (293 lb 3.4 oz), last menstrual period 12/14/2011, SpO2 100.00%.   Basename 12/15/11 0716 12/14/11 1629  NA 139 139  K 3.2* 3.4*  CL 104 103  CO2 20 16*  GLUCOSE 329* 340*  BUN 7 7  CREATININE 0.70 0.77  CALCIUM 9.8 9.3  MG -- --  PHOS -- --    Basename 12/14/11 1629  AST 15  ALT 12  ALKPHOS 91  BILITOT 0.2*  PROT 7.1  ALBUMIN 3.6     Basename 12/15/11 0716 12/14/11 1629  WBC 18.4* 10.7*  NEUTROABS -- 10.0*   HGB 9.6* 10.2*  HCT 31.6* 33.6*  MCV 67.7* 67.3*  PLT 348 349    Signed: Marinda Elk M.D. 12/15/2011, 1:41 PM

## 2011-12-15 NOTE — Progress Notes (Signed)
Inpatient Diabetes Program Recommendations  AACE/ADA: New Consensus Statement on Inpatient Glycemic Control (2009)  Target Ranges:  Prepandial:   less than 140 mg/dL      Peak postprandial:   less than 180 mg/dL (1-2 hours)      Critically ill patients:  140 - 180 mg/dL   CBGs today: 161/ 096 mg/dl Noted Resistant SSI started this morning.  Inpatient Diabetes Program Recommendations Insulin - Basal: May need to add Lantus while pt on IV steroids- Please add Lantus if fasting sugars stay elevated.    Note: Will follow.

## 2011-12-15 NOTE — Progress Notes (Signed)
   CARE MANAGEMENT NOTE 12/15/2011  Patient:  [name] [name],[name] [name]   Account Number:  [id number]  Date Initiated:  12/15/2011  Documentation initiated by:  Donn Pierini  Subjective/Objective Assessment:   Pt admitted with Asthma     Action/Plan:   PTA pt lived at home with children and her sister, was independent with ADLs, has home nebulizer.   Anticipated DC Date:  12/15/2011   Anticipated DC Plan:  HOME/SELF CARE      DC Planning Services  CM consult      Choice offered to / List presented to:             Status of service:  In process, will continue to follow Medicare Important Message given?   (If response is "NO", the following Medicare IM given date fields will be blank) Date Medicare IM given:   Date Additional Medicare IM given:    Discharge Disposition:    Per UR Regulation:    Comments:  PCP- pt states her doctors are back in [place]  12/15/11- 1200- Donn Pierini RN, BSN [phone] Spoke with pt at bedside regarding concerns about affording medications. As pt has insurance she is not eligible for assistance here. Pt states she has researched assistance offered on some of her medications and has not found in programs that offer assistance. Other options may be that she sees if there is any medications that she is on that can be changed to generic or another affordable drug- suggested she speak with MD/PCP. Pt also states that she plans to move back to [place] where she is from and where her medical doctors are. Offered to find her a PCP here- pt declined at this time. CM to follow- no anticipated d/c needs

## 2012-01-23 ENCOUNTER — Encounter (HOSPITAL_COMMUNITY): Payer: Self-pay | Admitting: Physical Medicine and Rehabilitation

## 2012-01-23 ENCOUNTER — Inpatient Hospital Stay (HOSPITAL_COMMUNITY)
Admission: AD | Admit: 2012-01-23 | Discharge: 2012-01-27 | DRG: 202 | Disposition: A | Discharge status: Home / Self Care | Payer: Medicare Other | Attending: Internal Medicine | Admitting: Internal Medicine

## 2012-01-23 DIAGNOSIS — E119 Type 2 diabetes mellitus without complications: Secondary | ICD-10-CM | POA: Diagnosis present

## 2012-01-23 DIAGNOSIS — J45901 Unspecified asthma with (acute) exacerbation: Principal | ICD-10-CM | POA: Diagnosis present

## 2012-01-23 DIAGNOSIS — R0603 Acute respiratory distress: Secondary | ICD-10-CM

## 2012-01-23 DIAGNOSIS — J40 Bronchitis, not specified as acute or chronic: Secondary | ICD-10-CM

## 2012-01-23 DIAGNOSIS — I10 Essential (primary) hypertension: Secondary | ICD-10-CM | POA: Diagnosis present

## 2012-01-23 DIAGNOSIS — K219 Gastro-esophageal reflux disease without esophagitis: Secondary | ICD-10-CM

## 2012-01-23 DIAGNOSIS — Z6841 Body Mass Index (BMI) 40.0 and over, adult: Secondary | ICD-10-CM

## 2012-01-23 DIAGNOSIS — R0902 Hypoxemia: Secondary | ICD-10-CM | POA: Diagnosis present

## 2012-01-23 DIAGNOSIS — E876 Hypokalemia: Secondary | ICD-10-CM | POA: Diagnosis present

## 2012-01-23 DIAGNOSIS — D649 Anemia, unspecified: Secondary | ICD-10-CM | POA: Diagnosis present

## 2012-01-23 LAB — BASIC METABOLIC PANEL
BUN: 6 mg/dL (ref 6–23)
Calcium: 9.4 mg/dL (ref 8.4–10.5)
GFR calc non Af Amer: 88 mL/min — ABNORMAL LOW (ref >90)
Glucose, Bld: 162 mg/dL — ABNORMAL HIGH (ref 70–99)
Sodium: 141 mEq/L (ref 135–145)

## 2012-01-23 LAB — CBC
HCT: 33.5 % — ABNORMAL LOW (ref 36.0–46.0)
Hemoglobin: 10.3 g/dL — ABNORMAL LOW (ref 12.0–15.0)
MCH: 21.7 pg — ABNORMAL LOW (ref 26.0–34.0)
MCHC: 30.7 g/dL (ref 30.0–36.0)

## 2012-01-23 LAB — GLUCOSE, CAPILLARY: Glucose-Capillary: 217 mg/dL — ABNORMAL HIGH (ref 70–99)

## 2012-01-23 LAB — MRSA PCR SCREENING: MRSA by PCR: POSITIVE — AB

## 2012-01-23 MED ORDER — FOLIC ACID 400 MCG PO TABS: 400.0000 ug | ORAL_TABLET | Freq: Every day | ORAL | Status: DC

## 2012-01-23 MED ORDER — SODIUM CHLORIDE 0.9 % IV SOLN: 250.0000 mL | INTRAVENOUS | Status: DC | PRN

## 2012-01-23 MED ORDER — ENOXAPARIN SODIUM 40 MG/0.4ML ~~LOC~~ SOLN
40.0000 mg | SUBCUTANEOUS | Status: DC
  Administered 2012-01-23 – 2012-01-26 (×4): 40 mg via SUBCUTANEOUS
  Filled 2012-01-23 (×6): qty 0.4

## 2012-01-23 MED ORDER — FLUTICASONE PROPIONATE 50 MCG/ACT NA SUSP
2.0000 | Freq: Every day | NASAL | Status: DC
  Administered 2012-01-23 – 2012-01-27 (×5): 2 via NASAL
  Filled 2012-01-23: qty 16

## 2012-01-23 MED ORDER — BUDESONIDE-FORMOTEROL FUMARATE 160-4.5 MCG/ACT IN AERO
2.0000 | INHALATION_SPRAY | Freq: Two times a day (BID) | RESPIRATORY_TRACT | Status: DC
  Administered 2012-01-23 – 2012-01-27 (×7): 2 via RESPIRATORY_TRACT
  Filled 2012-01-23 (×3): qty 6

## 2012-01-23 MED ORDER — DIPHENHYDRAMINE HCL 25 MG PO TABS
50.0000 mg | ORAL_TABLET | Freq: Every evening | ORAL | Status: DC | PRN
  Filled 2012-01-23: qty 2

## 2012-01-23 MED ORDER — SODIUM CHLORIDE 0.9 % IJ SOLN
3.0000 mL | Freq: Two times a day (BID) | INTRAMUSCULAR | Status: DC
Start: 2012-01-23 — End: 2012-01-27
  Administered 2012-01-23 – 2012-01-26 (×8): 3 mL via INTRAVENOUS

## 2012-01-23 MED ORDER — FOLIC ACID 0.5 MG HALF TAB
0.5000 mg | ORAL_TABLET | Freq: Every day | ORAL | Status: DC
  Administered 2012-01-23 – 2012-01-27 (×5): 0.5 mg via ORAL
  Filled 2012-01-23 (×6): qty 1

## 2012-01-23 MED ORDER — SALINE SPRAY 0.65 % NA SOLN
2.0000 | NASAL | Status: DC | PRN
  Filled 2012-01-23: qty 44

## 2012-01-23 MED ORDER — METFORMIN HCL 500 MG PO TABS
1000.0000 mg | ORAL_TABLET | Freq: Two times a day (BID) | ORAL | Status: DC
  Administered 2012-01-23 – 2012-01-27 (×9): 1000 mg via ORAL
  Filled 2012-01-23 (×13): qty 2

## 2012-01-23 MED ORDER — TIOTROPIUM BROMIDE MONOHYDRATE 18 MCG IN CAPS
18.0000 ug | ORAL_CAPSULE | Freq: Every day | RESPIRATORY_TRACT | Status: DC
  Filled 2012-01-23: qty 5

## 2012-01-23 MED ORDER — LORATADINE 10 MG PO TABS
10.0000 mg | ORAL_TABLET | Freq: Every day | ORAL | Status: DC
  Administered 2012-01-23 – 2012-01-27 (×5): 10 mg via ORAL
  Filled 2012-01-23 (×6): qty 1

## 2012-01-23 MED ORDER — ADULT MULTIVITAMIN W/MINERALS CH
1.0000 | ORAL_TABLET | Freq: Every day | ORAL | Status: DC
  Administered 2012-01-23 – 2012-01-27 (×5): 1 via ORAL
  Filled 2012-01-23 (×6): qty 1

## 2012-01-23 MED ORDER — SALINE NASAL SPRAY 0.65 % NA SOLN: 2.0000 | NASAL | Status: DC | PRN

## 2012-01-23 MED ORDER — IPRATROPIUM BROMIDE 0.02 % IN SOLN
0.5000 mg | Freq: Once | RESPIRATORY_TRACT | Status: AC
  Administered 2012-01-23: 0.5 mg via RESPIRATORY_TRACT
  Filled 2012-01-23 (×2): qty 2.5

## 2012-01-23 MED ORDER — POTASSIUM CHLORIDE CRYS ER 20 MEQ PO TBCR
40.0000 meq | EXTENDED_RELEASE_TABLET | Freq: Once | ORAL | Status: AC
  Administered 2012-01-23: 40 meq via ORAL
  Filled 2012-01-23: qty 2

## 2012-01-23 MED ORDER — IRBESARTAN 300 MG PO TABS
300.0000 mg | ORAL_TABLET | Freq: Every day | ORAL | Status: DC
  Administered 2012-01-23 – 2012-01-27 (×5): 300 mg via ORAL
  Filled 2012-01-23 (×6): qty 1

## 2012-01-23 MED ORDER — PANTOPRAZOLE SODIUM 40 MG PO TBEC
40.0000 mg | DELAYED_RELEASE_TABLET | Freq: Every day | ORAL | Status: DC
  Administered 2012-01-23 – 2012-01-27 (×5): 40 mg via ORAL
  Filled 2012-01-23 (×4): qty 1

## 2012-01-23 MED ORDER — PREDNISONE 20 MG PO TABS
40.0000 mg | ORAL_TABLET | Freq: Once | ORAL | Status: AC
  Administered 2012-01-23: 40 mg via ORAL
  Filled 2012-01-23: qty 2

## 2012-01-23 MED ORDER — METHYLPREDNISOLONE SODIUM SUCC 40 MG IJ SOLR
40.0000 mg | Freq: Two times a day (BID) | INTRAMUSCULAR | Status: DC
  Administered 2012-01-23 – 2012-01-24 (×3): 40 mg via INTRAVENOUS
  Filled 2012-01-23 (×5): qty 1

## 2012-01-23 MED ORDER — IRBESARTAN-HYDROCHLOROTHIAZIDE 300-25 MG PO TABS: 1.0000 | ORAL_TABLET | Freq: Every day | ORAL | Status: DC

## 2012-01-23 MED ORDER — MULTIVITAMINS PO CAPS: 1.0000 | ORAL_CAPSULE | Freq: Every day | ORAL | Status: DC

## 2012-01-23 MED ORDER — FERROUS FUMARATE 325 (106 FE) MG PO TABS
1.0000 | ORAL_TABLET | Freq: Every day | ORAL | Status: DC
  Administered 2012-01-23 – 2012-01-27 (×5): 106 mg via ORAL
  Filled 2012-01-23 (×6): qty 1

## 2012-01-23 MED ORDER — SODIUM CHLORIDE 0.9 % IJ SOLN
3.0000 mL | Freq: Two times a day (BID) | INTRAMUSCULAR | Status: DC
  Administered 2012-01-23 – 2012-01-26 (×4): 3 mL via INTRAVENOUS

## 2012-01-23 MED ORDER — METFORMIN HCL 500 MG PO TABS
1000.0000 mg | ORAL_TABLET | Freq: Two times a day (BID) | ORAL | Status: DC
Start: 2012-01-23 — End: 2012-01-23

## 2012-01-23 MED ORDER — AZITHROMYCIN 250 MG PO TABS
500.0000 mg | ORAL_TABLET | Freq: Once | ORAL | Status: AC
  Administered 2012-01-23: 500 mg via ORAL
  Filled 2012-01-23: qty 2

## 2012-01-23 MED ORDER — AZITHROMYCIN 500 MG PO TABS
500.0000 mg | ORAL_TABLET | Freq: Every day | ORAL | Status: AC
  Administered 2012-01-23 – 2012-01-26 (×4): 500 mg via ORAL
  Filled 2012-01-23 (×5): qty 1

## 2012-01-23 MED ORDER — HYDROCHLOROTHIAZIDE 25 MG PO TABS
25.0000 mg | ORAL_TABLET | Freq: Every day | ORAL | Status: DC
  Administered 2012-01-23 – 2012-01-27 (×5): 25 mg via ORAL
  Filled 2012-01-23 (×6): qty 1

## 2012-01-23 MED ORDER — DIPHENHYDRAMINE HCL 25 MG PO TABS: 50.0000 mg | ORAL_TABLET | Freq: Every evening | ORAL | Status: DC | PRN

## 2012-01-23 MED ORDER — LEVALBUTEROL HCL 0.63 MG/3ML IN NEBU
0.6300 mg | INHALATION_SOLUTION | RESPIRATORY_TRACT | Status: DC | PRN
  Administered 2012-01-23 – 2012-01-24 (×2): 0.63 mg via RESPIRATORY_TRACT
  Filled 2012-01-23 (×2): qty 3

## 2012-01-23 MED ORDER — ALBUTEROL (5 MG/ML) CONTINUOUS INHALATION SOLN
10.0000 mg | INHALATION_SOLUTION | RESPIRATORY_TRACT | Status: AC
  Administered 2012-01-23: 10 mg via RESPIRATORY_TRACT
  Filled 2012-01-23 (×2): qty 20

## 2012-01-23 MED ORDER — SODIUM CHLORIDE 0.9 % IJ SOLN: 3.0000 mL | INTRAMUSCULAR | Status: DC | PRN

## 2012-01-23 NOTE — ED Notes (Signed)
Pt resting quietly at the time. Expiratory and inspiratory wheezing heard. Pt coughing up yellow colored sputum. Denies pain. She is alert and oriented x4.

## 2012-01-23 NOTE — ED Notes (Signed)
Patient  Called out shortness of breath x 3 days tried nebs at home tried 3 prior to calling EMS with no relief. EMS gave albuterol 5/ atrovent 0.5, and second albuterol 5

## 2012-01-23 NOTE — ED Provider Notes (Signed)
History     CSN: [id number]  Arrival date & time 01/23/12  0309   First MD Initiated Contact with Patient 01/23/12 [phone]      Chief Complaint  Patient presents with  . Shortness of Breath    (Consider location/radiation/quality/duration/timing/severity/associated sxs/prior treatment) HPI Comments: 58 her old female with a history of asthma, presents with several days of worsening coughing, shortness of breath, nasal drainage, postnasal drip and wheezing. She has been using her albuterol treatments at home with temporary improvement for approximately one hour then recurrence of symptoms. She denies swelling in the legs, abdominal pain, chest pain, back pain. Symptoms are persistent, gradually getting worse, not associated with change in vision, sore throat  Patient is a 36 y.o. female presenting with shortness of breath. The history is provided by the patient.  Shortness of Breath  Associated symptoms include shortness of breath.    Past Medical History  Diagnosis Date  . Asthma   . Hypertension   . Chronic anemia   . Abnormal TSH   . Morbid obesity   . GERD (gastroesophageal reflux disease)   . Seasonal allergies   . Nasal polyps   . Diabetes mellitus     type 2  . Chronic bronchitis   . Sleep apnea   . Shortness of breath 12/14/11    "related to how bad my asthma is; sometimes @ rest, lying down, w/exertion"  . Pneumonia   . Blood transfusion   . Inguinal hernia unilateral, non-recurrent     right  . Kidney infection 2008  . Headache   . Anxiety   . Depression   . PTSD (post-traumatic stress disorder)     "related to prior hospitalizations; things that happened when I was in hospital"    Past Surgical History  Procedure Date  . Cervical cerclage 1999; 2002  . Tubal ligation 2002    Family History  Problem Relation Age of Onset  . Diabetes Mother   . Heart disease Father     History  Substance Use Topics  . Smoking status: Never Smoker   . Smokeless  tobacco: Never Used  . Alcohol Use: Yes     12/14/11 "only on birthdays; parties, and such"    OB History    Grav Para Term Preterm Abortions TAB SAB Ect Mult Living                  Review of Systems  Respiratory: Positive for shortness of breath.   All other systems reviewed and are negative.    Allergies  Aspirin; Ibuprofen; Shellfish allergy; Halls cough drops; and Other  Home Medications   Current Outpatient Rx  Name Route Sig Dispense Refill  . BUDESONIDE-FORMOTEROL FUMARATE 160-4.5 MCG/ACT IN AERO Inhalation Inhale 2 puffs into the lungs 2 (two) times daily. 1 Inhaler 12  . DIPHENHYDRAMINE HCL 50 MG PO TABS Oral Take 50 mg by mouth at bedtime as needed. For allergies     . FERROUS FUMARATE 325 (106 FE) MG PO TABS Oral Take 1 tablet by mouth daily.    Marland Kitchen FLUTICASONE PROPIONATE 50 MCG/ACT NA SUSP Nasal Place 2 sprays into the nose daily.      Marland Kitchen FOLIC ACID 400 MCG PO TABS Oral Take 400 mcg by mouth daily.      . IRBESARTAN-HYDROCHLOROTHIAZIDE 300-25 MG PO TABS Oral Take 1 tablet by mouth daily.      Marland Kitchen LEVALBUTEROL HCL 0.63 MG/3ML IN NEBU Nebulization Take 3 mLs (0.63 mg total) by  nebulization every 4 (four) hours as needed for wheezing. 3 mL 12  . LORATADINE 10 MG PO TABS Oral Take 10 mg by mouth daily.      . MULTIVITAMINS PO CAPS Oral Take 1 capsule by mouth daily.      Marland Kitchen PANTOPRAZOLE SODIUM 40 MG PO TBEC Oral Take 40 mg by mouth daily.     Marland Kitchen SALINE NASAL SPRAY NA Nasal Place 1 spray into the nose 3 (three) times daily as needed.      Marland Kitchen TIOTROPIUM BROMIDE MONOHYDRATE 18 MCG IN CAPS Inhalation Place 18 mcg into inhaler and inhale daily.     Marland Kitchen METFORMIN HCL 1000 MG PO TABS Oral Take 1,000 mg by mouth 2 (two) times daily with a meal. Takes only when taking Prednisone taper to avoid elevation of blood sugar levels    . PREDNISONE 10 MG PO TABS  Takes 6 tablets for 1 days, then 5 tablets for 1 days, then 4 tablets for 1 days, then 3 tablets for 1 days, then 1 tabs for 2 days,  then 1 tab for 1 days, and then stop. 21 tablet 0    BP 141/101  Pulse 126  Temp(Src) 98.2 F (36.8 C) (Oral)  Resp 18  SpO2 95%  Physical Exam  Nursing note and vitals reviewed. Constitutional: She appears well-developed and well-nourished. No distress.  HENT:  Head: Normocephalic and atraumatic.  Mouth/Throat: Oropharynx is clear and moist. No oropharyngeal exudate.       Turbinates with mild edema, no obvious drainage, oropharynx clear  Eyes: Conjunctivae and EOM are normal. Pupils are equal, round, and reactive to light. Right eye exhibits no discharge. Left eye exhibits no discharge. No scleral icterus.  Neck: Normal range of motion. Neck supple. No JVD present. No thyromegaly present.  Cardiovascular: Normal rate, regular rhythm, normal heart sounds and intact distal pulses.  Exam reveals no gallop and no friction rub.   No murmur heard. Pulmonary/Chest: Effort normal. No respiratory distress. She has wheezes. She has no rales.       No increased work of breathing, tachypnea to 22 breaths per minute, speaks in full sentences, diffuse expiratory wheezing and prolonged expiratory phase  Abdominal: Soft. Bowel sounds are normal. She exhibits no distension and no mass. There is no tenderness.  Musculoskeletal: Normal range of motion. She exhibits no edema and no tenderness.  Lymphadenopathy:    She has no cervical adenopathy.  Neurological: She is alert. Coordination normal.  Skin: Skin is warm and dry. No rash noted. No erythema.  Psychiatric: She has a normal mood and affect. Her behavior is normal.    ED Course  Procedures (including critical care time)   Labs Reviewed  CBC  BASIC METABOLIC PANEL   No results found.   1. Asthma with exacerbation       MDM  Diffuse expiratory wheezing consistent with reactive airway disease possibly secondary to sinusitis. We'll go ahead and treat with albuterol treatments continuous nebulizer therapy, Atrovent, prednisone,  Zithromax.  Nebulizer therapy given after continuous neb the patient continues to be significantly tachycardic with a pulse of 140, oxygen saturation 91-93% with very low peak flow in the 120 range.  Status with hospitalist who will admit for further evaluation. Continuous nebulizer therapy and continued respiratory distress - CC provided.  CRITICAL CARE Performed by: Vida Roller   Total critical care time: 35  Critical care time was exclusive of separately billable procedures and treating other patients.  Critical care was necessary to  treat or prevent imminent or life-threatening deterioration.  Critical care was time spent personally by me on the following activities: development of treatment plan with patient and/or surrogate as well as nursing, discussions with consultants, evaluation of patient's response to treatment, examination of patient, obtaining history from patient or surrogate, ordering and performing treatments and interventions, ordering and review of laboratory studies, ordering and review of radiographic studies, pulse oximetry and re-evaluation of patient's condition.  Labs reviewed by myself: K of 3.1, WBC of 7.4, Hgb of 10.3 which is higher than usual Hgb.       Vida Roller, MD 01/23/12 [phone]

## 2012-01-23 NOTE — H&P (Signed)
Patient's PCP: No Pcp  Chief Complaint: SOB  History of Present Illness: [name] [name] is a 36 y.o. AA female admitted with SOB, sinusitis, and wheezing.  Patient states that she has been getting sinus infections that have been exacerbating her asthma.  She also ran out of her spiriva 2 weeks ago.  She has been using her albuterol treatments at home with temporary improvement for approximately one hour then recurrence of symptoms.  In the ER she was given: albuterol treatments continuous nebulizer therapy, Atrovent, prednisone, Zithromax. Nebulizer therapy given after continuous neb the patient continues to be significantly tachycardic with a pulse of 140, oxygen saturation 91-93% with very low peak flow in the 120 range.  ER then wanted her admitted to the floor for further monitoring.  Denies fever, denies chills.     Meds: Scheduled Meds:   . azithromycin  500 mg Oral Once  . ipratropium  0.5 mg Nebulization Once  . predniSONE  40 mg Oral Once   Continuous Infusions:   . albuterol 10 mg (01/23/12 0506)   PRN Meds:. Allergies: Aspirin; Ibuprofen; Shellfish allergy; Halls cough drops; and Other Past Medical History  Diagnosis Date  . Asthma   . Hypertension   . Chronic anemia   . Abnormal TSH   . Morbid obesity   . GERD (gastroesophageal reflux disease)   . Seasonal allergies   . Nasal polyps   . Diabetes mellitus     type 2  . Chronic bronchitis   . Sleep apnea   . Shortness of breath 12/14/11    "related to how bad my asthma is; sometimes @ rest, lying down, w/exertion"  . Pneumonia   . Blood transfusion   . Inguinal hernia unilateral, non-recurrent     right  . Kidney infection 2008  . Headache   . Anxiety   . Depression   . PTSD (post-traumatic stress disorder)     "related to prior hospitalizations; things that happened when I was in hospital"   Past Surgical History  Procedure Date  . Cervical cerclage 1999; 2002  . Tubal ligation 2002   Family History   Problem Relation Age of Onset  . Diabetes Mother   . Heart disease Father    History   Social History  . Marital Status: Single    Spouse Name: N/A    Number of Children: N/A  . Years of Education: N/A   Occupational History  . Not on file.   Social History Main Topics  . Smoking status: Never Smoker   . Smokeless tobacco: Never Used  . Alcohol Use: Yes     12/14/11 "only on birthdays; parties, and such"  . Drug Use: No  . Sexually Active: Not Currently   Other Topics Concern  . Not on file   Social History Narrative  . No narrative on file   Review of Systems: All systems reviewed with the patient and positive as per history of present illness, otherwise all other systems are negative. A comprehensive review of systems was negative.  Physical Exam: Blood pressure 141/101, pulse 126, temperature 98.2 F (36.8 C), temperature source Oral, resp. rate 18, SpO2 95.00%. General: Awake, Oriented x3, No acute distress. HEENT: EOMI, Moist mucous membranes Neck: Supple, no JVD CV: S1 and S2, tachy after breathing treatment Lungs: Wheezing B/L, no pursed lip breathing- patient currently appears comfortable Abdomen: Soft, Nontender, Nondistended, +bowel sounds. Ext: Good pulses. Trace edema. No clubbing or cyanosis noted. Neuro: Cranial Nerves II-XII grossly intact.  Has 5/5 motor strength in upper and lower extremities.    Lab results: No results found for this basename: NA:2,K:2,CL:2,CO2:2,GLUCOSE:2,BUN:2,CREATININE:2,CALCIUM:2,MG:2,PHOS:2 in the last 72 hours No results found for this basename: AST:2,ALT:2,ALKPHOS:2,BILITOT:2,PROT:2,ALBUMIN:2 in the last 72 hours No results found for this basename: LIPASE:2,AMYLASE:2 in the last 72 hours No results found for this basename: WBC:2,NEUTROABS:2,HGB:2,HCT:2,MCV:2,PLT:2 in the last 72 hours No results found for this basename: CKTOTAL:3,CKMB:3,CKMBINDEX:3,TROPONINI:3 in the last 72 hours No components found with this basename:  POCBNP:3 No results found for this basename: DDIMER in the last 72 hours No results found for this basename: HGBA1C:2 in the last 72 hours No results found for this basename: CHOL:2,HDL:2,LDLCALC:2,TRIG:2,CHOLHDL:2,LDLDIRECT:2 in the last 72 hours No results found for this basename: TSH,T4TOTAL,FREET3,T3FREE,THYROIDAB in the last 72 hours No results found for this basename: VITAMINB12:2,FOLATE:2,FERRITIN:2,TIBC:2,IRON:2,RETICCTPCT:2 in the last 72 hours Imaging results:  No results found. Other results: EKG: normal EKG, normal sinus rhythm, unchanged from previous tracings.  Assessment & Plan by Problem:  Asthma exacerbation attacks- steroids, nebs, cont pulse ox, respiratory, counseled on how she should not run out of her medications as this can contribute to her exacerbations   HTN (hypertension), malignant- cont home meds   Morbid obesity- encourage weight loss   Type 2 diabetes mellitus- metformin    Time spent on admission, talking to the patient, and coordinating care was: 32 mins.  Meiko Ives, DO 01/23/2012, 7:20 AM

## 2012-01-23 NOTE — ED Notes (Signed)
Floor nurse unable to take report at the time, to call back.

## 2012-01-23 NOTE — ED Notes (Signed)
Report called to Zella Ball, RN. Pt to be transported on cardiac monitor. Vital signs stable at the time. No signs of distress noted.

## 2012-01-24 LAB — BLOOD GAS, ARTERIAL
Acid-Base Excess: 1.1 mmol/L (ref 0.0–2.0)
Patient temperature: 98.6
TCO2: 26.5 mmol/L (ref 0–100)
pH, Arterial: 7.41 — ABNORMAL HIGH (ref 7.350–7.400)

## 2012-01-24 LAB — GLUCOSE, CAPILLARY: Glucose-Capillary: 217 mg/dL — ABNORMAL HIGH (ref 70–99)

## 2012-01-24 MED ORDER — LORAZEPAM 1 MG PO TABS
1.0000 mg | ORAL_TABLET | ORAL | Status: DC | PRN
  Administered 2012-01-24: 1 mg via ORAL
  Filled 2012-01-24: qty 1

## 2012-01-24 MED ORDER — POTASSIUM CHLORIDE CRYS ER 20 MEQ PO TBCR
40.0000 meq | EXTENDED_RELEASE_TABLET | Freq: Two times a day (BID) | ORAL | Status: DC
  Administered 2012-01-24 – 2012-01-27 (×7): 40 meq via ORAL
  Filled 2012-01-24 (×9): qty 2

## 2012-01-24 MED ORDER — HYDRALAZINE HCL 20 MG/ML IJ SOLN: 10.0000 mg | Freq: Four times a day (QID) | INTRAMUSCULAR | Status: DC | PRN

## 2012-01-24 MED ORDER — PHENYLEPHRINE HCL 0.25 % NA SOLN
1.0000 | Freq: Four times a day (QID) | NASAL | Status: AC | PRN
  Filled 2012-01-24: qty 15

## 2012-01-24 MED ORDER — LEVALBUTEROL HCL 0.63 MG/3ML IN NEBU
0.6300 mg | INHALATION_SOLUTION | RESPIRATORY_TRACT | Status: AC
  Administered 2012-01-24: 0.63 mg via RESPIRATORY_TRACT
  Filled 2012-01-24: qty 3

## 2012-01-24 MED ORDER — INSULIN ASPART 100 UNIT/ML ~~LOC~~ SOLN
0.0000 [IU] | Freq: Every day | SUBCUTANEOUS | Status: DC
  Administered 2012-01-24 – 2012-01-25 (×2): 2 [IU] via SUBCUTANEOUS

## 2012-01-24 MED ORDER — METHYLPREDNISOLONE SODIUM SUCC 40 MG IJ SOLR
40.0000 mg | Freq: Four times a day (QID) | INTRAMUSCULAR | Status: DC
  Administered 2012-01-24 – 2012-01-25 (×3): 40 mg via INTRAVENOUS
  Filled 2012-01-24 (×7): qty 1

## 2012-01-24 MED ORDER — INSULIN ASPART 100 UNIT/ML ~~LOC~~ SOLN
0.0000 [IU] | Freq: Three times a day (TID) | SUBCUTANEOUS | Status: DC
  Administered 2012-01-24: 3 [IU] via SUBCUTANEOUS
  Administered 2012-01-25 – 2012-01-26 (×2): 2 [IU] via SUBCUTANEOUS
  Administered 2012-01-26 (×2): 3 [IU] via SUBCUTANEOUS
  Filled 2012-01-24: qty 3

## 2012-01-24 MED ORDER — LEVALBUTEROL HCL 0.63 MG/3ML IN NEBU
0.6300 mg | INHALATION_SOLUTION | RESPIRATORY_TRACT | Status: DC | PRN
  Administered 2012-01-24 (×2): 0.63 mg via RESPIRATORY_TRACT
  Filled 2012-01-24: qty 3

## 2012-01-24 MED ORDER — LEVALBUTEROL HCL 0.63 MG/3ML IN NEBU
0.6300 mg | INHALATION_SOLUTION | RESPIRATORY_TRACT | Status: DC
  Administered 2012-01-24 (×5): 0.63 mg via RESPIRATORY_TRACT
  Filled 2012-01-24 (×13): qty 3

## 2012-01-24 MED ORDER — LEVALBUTEROL HCL 0.63 MG/3ML IN NEBU
0.6300 mg | INHALATION_SOLUTION | RESPIRATORY_TRACT | Status: DC | PRN
Start: 2012-01-24 — End: 2012-01-27
  Administered 2012-01-25 (×2): 0.63 mg via RESPIRATORY_TRACT
  Filled 2012-01-24: qty 3

## 2012-01-24 NOTE — Progress Notes (Signed)
Subjective: Per nursing patient irate on the phone, yelling at family.  HR increasing and pulse ox decreasing When I walked in room, patient on phone, o2 off - sats in the 80s, HR120s C/o SOB and nasal polyps   Objective: Vital signs in last 24 hours: Filed Vitals:   01/24/12 0800 01/24/12 0900 01/24/12 1000 01/24/12 1015  BP: 138/93 151/83 157/92   Pulse: 117 110 111   Temp:      TempSrc:      Resp: 22 20 21    Height:      Weight:      SpO2: 94% 95% 92% 94%   Weight change:   Intake/Output Summary (Last 24 hours) at 01/24/12 1119 Last data filed at 01/24/12 0955  Gross per 24 hour  Intake    243 ml  Output      0 ml  Net    243 ml    Physical Exam: General: Awake, Oriented, audible wheezing HEENT: EOMI. Neck: Supple CV: S1 and S2, Tachy Lungs: wheezing B/L Abdomen: Soft, Nontender, Nondistended, +bowel sounds. Ext: Good pulses. Trace edema.   Lab Results:  Basename 01/23/12 0730  NA 141  K 3.1*  CL 105  CO2 29  GLUCOSE 162*  BUN 6  CREATININE 0.84  CALCIUM 9.4  MG --  PHOS --   No results found for this basename: AST:2,ALT:2,ALKPHOS:2,BILITOT:2,PROT:2,ALBUMIN:2 in the last 72 hours No results found for this basename: LIPASE:2,AMYLASE:2 in the last 72 hours  Basename 01/23/12 0730  WBC 7.4  NEUTROABS --  HGB 10.3*  HCT 33.5*  MCV 70.5*  PLT 276   No results found for this basename: CKTOTAL:3,CKMB:3,CKMBINDEX:3,TROPONINI:3 in the last 72 hours No components found with this basename: POCBNP:3 No results found for this basename: DDIMER:2 in the last 72 hours No results found for this basename: HGBA1C:2 in the last 72 hours No results found for this basename: CHOL:2,HDL:2,LDLCALC:2,TRIG:2,CHOLHDL:2,LDLDIRECT:2 in the last 72 hours No results found for this basename: TSH,T4TOTAL,FREET3,T3FREE,THYROIDAB in the last 72 hours No results found for this basename: VITAMINB12:2,FOLATE:2,FERRITIN:2,TIBC:2,IRON:2,RETICCTPCT:2 in the last 72 hours  Micro  Results: Recent Results (from the past 240 hour(s))  MRSA PCR SCREENING     Status: Abnormal   Collection Time   01/23/12  7:00 PM      Component Value Range Status Comment   MRSA by PCR POSITIVE (*) NEGATIVE  Final     Studies/Results: No results found.  Medications: I have reviewed the patient's current medications. Scheduled Meds:   . azithromycin  500 mg Oral Daily  . budesonide-formoterol  2 puff Inhalation BID  . enoxaparin  40 mg Subcutaneous Q24H  . ferrous fumarate  1 tablet Oral Daily  . fluticasone  2 spray Each Nare Daily  . folic acid  0.5 mg Oral Daily  . irbesartan  300 mg Oral Daily   And  . hydrochlorothiazide  25 mg Oral Daily  . insulin aspart  0-15 Units Subcutaneous TID WC  . insulin aspart  0-5 Units Subcutaneous QHS  . levalbuterol  0.63 mg Nebulization STAT  . levalbuterol  0.63 mg Nebulization Q4H  . loratadine  10 mg Oral Daily  . metFORMIN  1,000 mg Oral BID WC  . methylPREDNISolone (SOLU-MEDROL) injection  40 mg Intravenous Q6H  . mulitivitamin with minerals  1 tablet Oral Daily  . pantoprazole  40 mg Oral Daily  . potassium chloride  40 mEq Oral Once  . potassium chloride  40 mEq Oral BID  . sodium chloride  3 mL Intravenous Q12H  . sodium chloride  3 mL Intravenous Q12H  . DISCONTD: methylPREDNISolone (SOLU-MEDROL) injection  40 mg Intravenous Q12H  . DISCONTD: tiotropium  18 mcg Inhalation Daily   Continuous Infusions:  PRN Meds:.sodium chloride, diphenhydrAMINE, levalbuterol, LORazepam, phenylephrine, sodium chloride, sodium chloride, DISCONTD: levalbuterol  Assessment/Plan:  Asthma exacerbation attacks- nebs ATC and PRN, increase steroids, O2, abx, if HR and breathing settle down plan to transfer out of unit.  Had talk with patient about trying to remain calm here in the hospital and that we are trying to help her and need her cooperation.  She is agreeable.   HTN (hypertension)- cont home meds   Morbid obesity- encourage cessation, prob  OSA   Type 2 diabetes mellitus- SSI, metformin  Hypokalemia- repleat  Anemia- outpatient work up     LOS: 1 day  Rogena Deupree, DO 01/24/2012, 11:19 AM

## 2012-01-24 NOTE — Progress Notes (Signed)
Patient asked for Respiratory treatment at about 0230am, Resp. Therapist arrived the unit and treatment was given.patient became tachypnea and 02 sat at this time was between 88-90%.Patient became uncomfortable, Rapid response was called to the unit.Breathing tx order ordered at this time for Qhrx2 and then Q4hrs. AT this point a decision was made by both the Dr., and rapid response Nurse to transfer patient to  3100, RM 3106.A report was called in and patient transferred as ordered.Before transfer an order for Ativan 1mg  po was granted by the attending Doctor which the patient took before transferred.

## 2012-01-24 NOTE — Progress Notes (Addendum)
Inpatient Diabetes Program Recommendations  AACE/ADA: New Consensus Statement on Inpatient Glycemic Control (2009)  Target Ranges:  Prepandial:   less than 140 mg/dL      Peak postprandial:   less than 180 mg/dL (1-2 hours)      Critically ill patients:  140 - 180 mg/dL   Results for JENNAFER, GLADUE (MRN [id number]) as of 01/24/2012 08:39  Ref. Range 01/23/2012 12:33 01/23/2012 16:54  Glucose-Capillary Latest Range: 70-99 mg/dL 409 (H) 811 (H)    Inpatient Diabetes Program Recommendations Correction (SSI): Please start Novolog Moderate correction scale (SSI) tid ac + HS while pt on IV steroids.  Note: Will follow. Ambrose Finland RN, MSN, CDE Diabetes Coordinator Inpatient Diabetes Program [phone]

## 2012-01-24 NOTE — Progress Notes (Signed)
Called by primary RN to see pt for pt requesting CAT neb tx.  On arrival pt found to be sitting on the side of the bed working on her laptop computer.  Pt able to hold conversation with full sentences, but with some gaspy breaths.  On assessment pt with good air movement t/o and diffuse wheezing.  Pt states she takes her albuterol IH every 4 hrs at home when she gets this way along with her symbicort.  Pt frustrated that she "soils" herself when she coughs & therefore must get up to clean herself becoming more SOB.  Options discussed with pt to reduce unnecessary movement until her resp. effort was better controlled.  Elray Mcgregor, NP with [facility] was contacted and updated regarding pt's condition & hourly requests for neb tx along with a request for a CAT.  Pt to be transferred to SDU for closer monitoring and ease in providing frequent neb tx.

## 2012-01-24 NOTE — Progress Notes (Signed)
eLink Physician-Brief Progress Note Patient Name: [name] [name] DOB: June 04, 1976 MRN: [id number]  Date of Service  01/24/2012   HPI/Events of Note  Call from resp therapy requesting assistance in the care of a patient admitted with asthma exacerbation.  Apparently frequent neb treatments on floor now transferred to ICU with coughing, lower sats (91), and tachypnea. Have not received call from MD requesting assistance with patient.  Camera evaluation a few minutes later shows patient to be resting in bed, HR of 122, O2 sats of 97%, RR of 26 and BP of 159/100.  Review of records shows patient to be on spriva, xopenex (prn)     eICU Interventions  Plan: D/C spriva Scheduled xopenex PCCM to stop by and see if consult necessary Check ABG   Intervention Category Intermediate Interventions: Respiratory distress - evaluation and management  Sharah Finnell 01/24/2012, 7:00 AM

## 2012-01-24 NOTE — Progress Notes (Signed)
Utilization review complete 

## 2012-01-24 NOTE — Progress Notes (Signed)
Patient doing better, still wheezing but not requiring as much PRN nebs, will transfer to floor. [name] [name]

## 2012-01-25 LAB — GLUCOSE, CAPILLARY
Glucose-Capillary: 101 mg/dL — ABNORMAL HIGH (ref 70–99)
Glucose-Capillary: 137 mg/dL — ABNORMAL HIGH (ref 70–99)

## 2012-01-25 MED ORDER — METHYLPREDNISOLONE SODIUM SUCC 125 MG IJ SOLR
60.0000 mg | Freq: Four times a day (QID) | INTRAMUSCULAR | Status: DC
  Administered 2012-01-25 – 2012-01-26 (×4): 60 mg via INTRAVENOUS
  Filled 2012-01-25: qty 0.96
  Filled 2012-01-25: qty 2
  Filled 2012-01-25 (×4): qty 0.96
  Filled 2012-01-25: qty 2
  Filled 2012-01-25 (×2): qty 0.96
  Filled 2012-01-25: qty 2

## 2012-01-25 MED ORDER — CHLORHEXIDINE GLUCONATE CLOTH 2 % EX PADS
6.0000 | MEDICATED_PAD | Freq: Every day | CUTANEOUS | Status: DC
  Administered 2012-01-25 – 2012-01-27 (×3): 6 via TOPICAL

## 2012-01-25 MED ORDER — MUPIROCIN 2 % EX OINT
TOPICAL_OINTMENT | Freq: Two times a day (BID) | CUTANEOUS | Status: DC
  Administered 2012-01-25 – 2012-01-27 (×5): via NASAL
  Filled 2012-01-25: qty 22

## 2012-01-25 MED ORDER — LEVALBUTEROL HCL 0.63 MG/3ML IN NEBU
0.6300 mg | INHALATION_SOLUTION | Freq: Four times a day (QID) | RESPIRATORY_TRACT | Status: DC
  Administered 2012-01-25 – 2012-01-27 (×7): 0.63 mg via RESPIRATORY_TRACT
  Filled 2012-01-25 (×13): qty 3

## 2012-01-25 NOTE — Progress Notes (Signed)
   CARE MANAGEMENT NOTE 01/25/2012  Patient:  [name],[name]   Account Number:  [id number]  Date Initiated:  01/25/2012  Documentation initiated by:  Donn Pierini  Subjective/Objective Assessment:   Pt admitted with asthma     Action/Plan:   PTA pt lived at home was independent with ADLs   Anticipated DC Date:  01/27/2012   Anticipated DC Plan:  HOME/SELF CARE      DC Planning Services  CM consult      Choice offered to / List presented to:             Status of service:  In process, will continue to follow Medicare Important Message given?   (If response is "NO", the following Medicare IM given date fields will be blank) Date Medicare IM given:   Date Additional Medicare IM given:    Discharge Disposition:    Per UR Regulation:    Comments:  01/25/12- 1230- Donn Pierini RN, BSN [phone] Spoke with pt at bedside, per conversation pt states that she has an upcoming appointment with [facility] for Mar. 12. She also gets her medications at [facility] and plans to call a cab to get home. She plans to talk to MD regarding prescription needs for discharge. CM to follow.

## 2012-01-25 NOTE — Progress Notes (Signed)
Subjective: Sleeping  still wheezing and having SOB   Objective: Vital signs in last 24 hours: Filed Vitals:   01/24/12 2358 01/25/12 0500 01/25/12 0528 01/25/12 0812  BP:  154/99    Pulse:  97    Temp:  97.2 F (36.2 C)    TempSrc:  Oral    Resp:  20    Height:      Weight:      SpO2: 95% 100% 96% 100%   Weight change:   Intake/Output Summary (Last 24 hours) at 01/25/12 0925 Last data filed at 01/24/12 2126  Gross per 24 hour  Intake      6 ml  Output      0 ml  Net      6 ml    Physical Exam: General: sleepy, Oriented, cooperative, audible wheezing HEENT: EOMI. Neck: Supple CV: S1 and S2, rrr Lungs: diffuse wheezing B/L Abdomen: Soft, Nontender, Nondistended, +bowel sounds. Ext: Good pulses. Trace edema.   Lab Results:  Basename 01/23/12 0730  NA 141  K 3.1*  CL 105  CO2 29  GLUCOSE 162*  BUN 6  CREATININE 0.84  CALCIUM 9.4  MG --  PHOS --   No results found for this basename: AST:2,ALT:2,ALKPHOS:2,BILITOT:2,PROT:2,ALBUMIN:2 in the last 72 hours No results found for this basename: LIPASE:2,AMYLASE:2 in the last 72 hours  Basename 01/23/12 0730  WBC 7.4  NEUTROABS --  HGB 10.3*  HCT 33.5*  MCV 70.5*  PLT 276   No results found for this basename: CKTOTAL:3,CKMB:3,CKMBINDEX:3,TROPONINI:3 in the last 72 hours No components found with this basename: POCBNP:3 No results found for this basename: DDIMER:2 in the last 72 hours No results found for this basename: HGBA1C:2 in the last 72 hours No results found for this basename: CHOL:2,HDL:2,LDLCALC:2,TRIG:2,CHOLHDL:2,LDLDIRECT:2 in the last 72 hours No results found for this basename: TSH,T4TOTAL,FREET3,T3FREE,THYROIDAB in the last 72 hours No results found for this basename: VITAMINB12:2,FOLATE:2,FERRITIN:2,TIBC:2,IRON:2,RETICCTPCT:2 in the last 72 hours  Micro Results: Recent Results (from the past 240 hour(s))  MRSA PCR SCREENING     Status: Abnormal   Collection Time   01/23/12  7:00 PM   Component Value Range Status Comment   MRSA by PCR POSITIVE (*) NEGATIVE  Final     Studies/Results: No results found.  Medications: I have reviewed the patient's current medications. Scheduled Meds:    . azithromycin  500 mg Oral Daily  . budesonide-formoterol  2 puff Inhalation BID  . Chlorhexidine Gluconate Cloth  6 each Topical Q0600  . enoxaparin  40 mg Subcutaneous Q24H  . ferrous fumarate  1 tablet Oral Daily  . fluticasone  2 spray Each Nare Daily  . folic acid  0.5 mg Oral Daily  . irbesartan  300 mg Oral Daily   And  . hydrochlorothiazide  25 mg Oral Daily  . insulin aspart  0-15 Units Subcutaneous TID WC  . insulin aspart  0-5 Units Subcutaneous QHS  . levalbuterol  0.63 mg Nebulization Q6H  . loratadine  10 mg Oral Daily  . metFORMIN  1,000 mg Oral BID WC  . methylPREDNISolone (SOLU-MEDROL) injection  60 mg Intravenous Q6H  . mulitivitamin with minerals  1 tablet Oral Daily  . mupirocin ointment   Nasal BID  . pantoprazole  40 mg Oral Daily  . potassium chloride  40 mEq Oral BID  . sodium chloride  3 mL Intravenous Q12H  . sodium chloride  3 mL Intravenous Q12H  . DISCONTD: levalbuterol  0.63 mg Nebulization Q4H  .  DISCONTD: methylPREDNISolone (SOLU-MEDROL) injection  40 mg Intravenous Q12H  . DISCONTD: methylPREDNISolone (SOLU-MEDROL) injection  40 mg Intravenous Q6H   Continuous Infusions:  PRN Meds:.sodium chloride, diphenhydrAMINE, hydrALAZINE, levalbuterol, LORazepam, phenylephrine, sodium chloride, sodium chloride, DISCONTD: levalbuterol  Assessment/Plan:  Asthma exacerbation attacks- nebs ATC and PRN, increase steroids, O2, abx,    HTN (hypertension)- cont home meds (elevated sec to seroids?)   Morbid obesity- encourage weight loss, prob OSA   Type 2 diabetes mellitus- SSI while on steroids, metformin  Hypokalemia- repleat  Anemia- outpatient work up  DISPO: once breathing stabilized    LOS: 2 days  Shykeria Sakamoto, DO 01/25/2012, 9:25 AM

## 2012-01-25 NOTE — Progress Notes (Signed)
RT assessed pt to Xopenex Q6 and continued with Q2 PRN. Pt has not been in any distress overnight, and asked to skip 0400 treatment. Pt did call for treatment at 0500 and when RT arrived for treatment, Pt was sleeping and in no distress at all. Pt still on 3lpm [place], but sats in upper 90's. RT has heard little to no wheeze tonight, and pt has been in no obvious distress. In fact, pt has been on the phone several times and had no episodes of SOB or increased WOB. RT assessed to Xopenex Q6 to see how pt tolerates, but will continue with Xopenex Q2PRN. No other changes at this time. RT will continue to monitor.

## 2012-01-26 ENCOUNTER — Encounter (HOSPITAL_COMMUNITY): Payer: Self-pay | Admitting: Radiology

## 2012-01-26 ENCOUNTER — Inpatient Hospital Stay (HOSPITAL_COMMUNITY): Payer: Medicare Other

## 2012-01-26 LAB — GLUCOSE, CAPILLARY
Glucose-Capillary: 178 mg/dL — ABNORMAL HIGH (ref 70–99)
Glucose-Capillary: 124 mg/dL — ABNORMAL HIGH (ref 70–99)

## 2012-01-26 LAB — BASIC METABOLIC PANEL
BUN: 13 mg/dL (ref 6–23)
CO2: 23 mEq/L (ref 19–32)
Chloride: 104 mEq/L (ref 96–112)
Creatinine, Ser: 0.67 mg/dL (ref 0.50–1.10)

## 2012-01-26 MED ORDER — PREDNISONE 50 MG PO TABS
50.0000 mg | ORAL_TABLET | Freq: Every day | ORAL | Status: DC
  Administered 2012-01-27: 50 mg via ORAL
  Filled 2012-01-26 (×2): qty 1

## 2012-01-26 MED ORDER — IOHEXOL 350 MG/ML SOLN
50.0000 mL | Freq: Once | INTRAVENOUS | Status: AC | PRN
  Administered 2012-01-26: 50 mL via INTRAVENOUS

## 2012-01-26 MED ORDER — PREDNISONE 10 MG PO TABS: ORAL_TABLET | ORAL | Status: DC

## 2012-01-26 MED ORDER — ALBUTEROL SULFATE (2.5 MG/3ML) 0.083% IN NEBU: 2.5000 mg | INHALATION_SOLUTION | RESPIRATORY_TRACT | Status: DC | PRN

## 2012-01-26 NOTE — Discharge Summary (Signed)
Physician Discharge Summary  [name] [name] MRN: [id number] DOB/AGE: [birth year] 36 y.o.  PCP: No Pcp   Admit date: 01/23/2012 Discharge date: 01/26/2012  Discharge Diagnoses:  Hypoxia due   to asthma exacerbation  HTN (hypertension), malignant  Morbid obesity  Type 2 diabetes mellitus  Asthma exacerbation attacks   Medication List  As of 01/26/2012 12:18 PM   TAKE these medications         albuterol (2.5 MG/3ML) 0.083% nebulizer solution   Commonly known as: PROVENTIL   Take 3 mLs (2.5 mg total) by nebulization every 4 (four) hours as needed for wheezing or shortness of breath.      budesonide-formoterol 160-4.5 MCG/ACT inhaler   Commonly known as: SYMBICORT   Inhale 2 puffs into the lungs 2 (two) times daily.      diphenhydrAMINE 50 MG tablet   Commonly known as: BENADRYL   Take 50 mg by mouth at bedtime as needed. For allergies        ferrous fumarate 325 (106 FE) MG Tabs   Commonly known as: HEMOCYTE - 106 mg FE   Take 1 tablet by mouth daily.      fluticasone 50 MCG/ACT nasal spray   Commonly known as: FLONASE   Place 2 sprays into the nose daily.      folic acid 400 MCG tablet   Commonly known as: FOLVITE   Take 400 mcg by mouth daily.      irbesartan-hydrochlorothiazide 300-25 MG per tablet   Commonly known as: AVALIDE   Take 1 tablet by mouth daily.      levalbuterol 0.63 MG/3ML nebulizer solution   Commonly known as: XOPENEX   Take 3 mLs (0.63 mg total) by nebulization every 4 (four) hours as needed for wheezing.      loratadine 10 MG tablet   Commonly known as: CLARITIN   Take 10 mg by mouth daily.      metFORMIN 1000 MG tablet   Commonly known as: GLUCOPHAGE   Take 1,000 mg by mouth 2 (two) times daily with a meal. Takes only when taking Prednisone taper to avoid elevation of blood sugar levels      multivitamin capsule   Take 1 capsule by mouth daily.      pantoprazole 40 MG tablet   Commonly known as: PROTONIX   Take 40 mg by mouth  daily.      predniSONE 10 MG tablet   Commonly known as: DELTASONE   Takes 6 tablets for 3 days, then 5 tablets for 3 days, then 4 tablets for 3 days, then 3 tablets for 3 days, then 1 tabs for 3 days, then 1 tab for 3 days, and then stop.      SALINE NASAL SPRAY NA   Place 1 spray into the nose 3 (three) times daily as needed.      tiotropium 18 MCG inhalation capsule   Commonly known as: SPIRIVA   Place 18 mcg into inhaler and inhale daily.            Discharge Condition: Stable  Disposition: 01-Home or Self Care   Consults:Stable      Microbiology: Recent Results (from the past 240 hour(s))  MRSA PCR SCREENING     Status: Abnormal   Collection Time   01/23/12  7:00 PM      Component Value Range Status Comment   MRSA by PCR POSITIVE (*) NEGATIVE  Final      Labs: Results for orders placed during  the hospital encounter of 01/23/12 (from the past 48 hour(s))  GLUCOSE, CAPILLARY     Status: Abnormal   Collection Time   01/24/12  3:37 PM      Component Value Range Comment   Glucose-Capillary 165 (*) 70 - 99 (mg/dL)   GLUCOSE, CAPILLARY     Status: Abnormal   Collection Time   01/24/12  8:16 PM      Component Value Range Comment   Glucose-Capillary 217 (*) 70 - 99 (mg/dL)    Comment 1 Notify RN     GLUCOSE, CAPILLARY     Status: Abnormal   Collection Time   01/25/12  7:22 AM      Component Value Range Comment   Glucose-Capillary 101 (*) 70 - 99 (mg/dL)    Comment 1 Notify RN     GLUCOSE, CAPILLARY     Status: Abnormal   Collection Time   01/25/12 11:44 AM      Component Value Range Comment   Glucose-Capillary 117 (*) 70 - 99 (mg/dL)    Comment 1 Notify RN     GLUCOSE, CAPILLARY     Status: Abnormal   Collection Time   01/25/12  4:01 PM      Component Value Range Comment   Glucose-Capillary 137 (*) 70 - 99 (mg/dL)    Comment 1 Notify RN     GLUCOSE, CAPILLARY     Status: Abnormal   Collection Time   01/25/12  9:13 PM      Component Value Range Comment    Glucose-Capillary 250 (*) 70 - 99 (mg/dL)   GLUCOSE, CAPILLARY     Status: Abnormal   Collection Time   01/26/12  7:33 AM      Component Value Range Comment   Glucose-Capillary 178 (*) 70 - 99 (mg/dL)    Comment 1 Notify RN     GLUCOSE, CAPILLARY     Status: Abnormal   Collection Time   01/26/12 11:13 AM      Component Value Range Comment   Glucose-Capillary 200 (*) 70 - 99 (mg/dL)    Comment 1 Notify RN        HPI 36 y.o. AA female admitted with SOB, sinusitis, and wheezing. Patient states that she has been getting sinus infections that have been exacerbating her asthma. She also ran out of her spiriva 2 weeks ago. She has been using her albuterol treatments at home with temporary improvement for approximately one hour then recurrence of symptoms. In the ER she was given: albuterol treatments continuous nebulizer therapy, Atrovent, prednisone, Zithromax. Nebulizer therapy given after continuous neb the patient continues to be significantly tachycardic with a pulse of 140, oxygen saturation 91-93% with very low peak flow in the 120 range. ER then wanted her admitted to the floor for further monitoring. Denies fever, denies chills.  HOSPITAL COURSE:  #1 asthma exacerbation the patient was started on IV Solu-Medrol nebulizer treatments She was resumed on her Spiriva fluticasone and was switched to Xopenex nebulizer secondary to her tachycardia. She had a CT angiogram pending to rule out pulmonary embolism. She was also started on empiric antibiotics. If the CT angiogram is negative the patient will be discharged home today #2 hypertension continue her medication #3 morbid obesity the patient has been counseled about weight loss Type 2 diabetes the patient will resume her metformin she did have some steroid-induced hyperglycemia I anticipate this to improve as the patient's steroids are tapered and   Discharge Exam:  Blood pressure  155/91, pulse 95, temperature 98.3 F (36.8 C), temperature  source Oral, resp. rate 20, height 5\' 6"  (1.676 m), weight 120.5 kg (265 lb 10.5 oz), last menstrual period 11/30/2011, SpO2 99.00%.  General: Awake, Oriented x3, No acute distress.  HEENT: EOMI, Moist mucous membranes  Neck: Supple, no JVD  CV: S1 and S2, tachy after breathing treatment  Lungs: Wheezing B/L, no pursed lip breathing- patient currently appears comfortable  Abdomen: Soft, Nontender, Nondistended, +bowel sounds.  Ext: Good pulses. Trace edema. No clubbing or cyanosis noted.  Neuro: Cranial Nerves II-XII grossly intact. Has 5/5 motor strength in upper and lower extremities.       Discharge Orders    Future Appointments: Provider: Department: Dept Phone: Center:   02/08/2012 11:30 AM Kalman Shan, MD [facility] [phone] None      Follow-up Information    Follow up with No Pcp .         SignedRicharda Overlie 01/26/2012, 12:18 PM        On the

## 2012-01-26 NOTE — Progress Notes (Signed)
Walking O2 sat completed.  Pt ambulated entire hallway with standby assistance. Pt O2 sat did not drop below 96% on room air. Pt HR did sustain in the 120s during ambulation.

## 2012-01-27 LAB — GLUCOSE, CAPILLARY
Glucose-Capillary: 98 mg/dL (ref 70–99)
Glucose-Capillary: 94 mg/dL (ref 70–99)

## 2012-01-27 NOTE — Progress Notes (Signed)
Pt. Discharged to home via taxi after discharge instructions and scripts given.  Escorted to exit via wheelchair by nurse tech.

## 2012-01-28 NOTE — Progress Notes (Signed)
   CARE MANAGEMENT NOTE 01/28/2012  Patient:  [name] [name],[name] [name]   Account Number:  [id number]  Date Initiated:  01/25/2012  Documentation initiated by:  Donn Pierini  Subjective/Objective Assessment:   Pt admitted with asthma     Action/Plan:   PTA pt lived at home was independent with ADLs   Anticipated DC Date:  01/27/2012   Anticipated DC Plan:  HOME/SELF CARE      DC Planning Services  CM consult      Choice offered to / List presented to:             Status of service:  Completed, signed off Medicare Important Message given?   (If response is "NO", the following Medicare IM given date fields will be blank) Date Medicare IM given:   Date Additional Medicare IM given:    Discharge Disposition:  HOME/SELF CARE  Per UR Regulation:    Comments:  01/25/12- 1230- Donn Pierini RN, BSN [phone] Spoke with pt at bedside, per conversation pt states that she has an upcoming appointment with [facility] for Mar. 12. She also gets her medications at [facility] and plans to call a cab to get home. She plans to talk to MD regarding prescription needs for discharge. CM to follow.

## 2012-02-08 ENCOUNTER — Inpatient Hospital Stay: Payer: Medicare Other | Admitting: Internal Medicine

## 2012-03-23 ENCOUNTER — Inpatient Hospital Stay (HOSPITAL_COMMUNITY)
Admission: EM | Admit: 2012-03-23 | Discharge: 2012-03-24 | DRG: 202 | Disposition: A | Discharge status: Home / Self Care | Payer: Medicare Other | Attending: Internal Medicine | Admitting: Internal Medicine

## 2012-03-23 ENCOUNTER — Encounter (HOSPITAL_COMMUNITY): Payer: Self-pay | Admitting: Emergency Medicine

## 2012-03-23 ENCOUNTER — Inpatient Hospital Stay (HOSPITAL_COMMUNITY): Payer: Medicare Other

## 2012-03-23 DIAGNOSIS — K219 Gastro-esophageal reflux disease without esophagitis: Secondary | ICD-10-CM

## 2012-03-23 DIAGNOSIS — J40 Bronchitis, not specified as acute or chronic: Secondary | ICD-10-CM

## 2012-03-23 DIAGNOSIS — J45902 Unspecified asthma with status asthmaticus: Principal | ICD-10-CM | POA: Diagnosis present

## 2012-03-23 DIAGNOSIS — Z6841 Body Mass Index (BMI) 40.0 and over, adult: Secondary | ICD-10-CM

## 2012-03-23 DIAGNOSIS — R000 Tachycardia, unspecified: Secondary | ICD-10-CM | POA: Diagnosis present

## 2012-03-23 DIAGNOSIS — D649 Anemia, unspecified: Secondary | ICD-10-CM | POA: Diagnosis present

## 2012-03-23 DIAGNOSIS — J45901 Unspecified asthma with (acute) exacerbation: Secondary | ICD-10-CM

## 2012-03-23 DIAGNOSIS — J9600 Acute respiratory failure, unspecified whether with hypoxia or hypercapnia: Secondary | ICD-10-CM

## 2012-03-23 DIAGNOSIS — E119 Type 2 diabetes mellitus without complications: Secondary | ICD-10-CM | POA: Diagnosis present

## 2012-03-23 DIAGNOSIS — Z9119 Patient's noncompliance with other medical treatment and regimen: Secondary | ICD-10-CM

## 2012-03-23 DIAGNOSIS — J9601 Acute respiratory failure with hypoxia: Secondary | ICD-10-CM

## 2012-03-23 DIAGNOSIS — I10 Essential (primary) hypertension: Secondary | ICD-10-CM | POA: Diagnosis present

## 2012-03-23 DIAGNOSIS — Z91199 Patient's noncompliance with other medical treatment and regimen due to unspecified reason: Secondary | ICD-10-CM

## 2012-03-23 DIAGNOSIS — E876 Hypokalemia: Secondary | ICD-10-CM | POA: Diagnosis present

## 2012-03-23 LAB — DIFFERENTIAL
Basophils Relative: 0 % (ref 0–1)
Eosinophils Absolute: 0.8 10*3/uL — ABNORMAL HIGH (ref 0.0–0.7)
Eosinophils Relative: 9 % — ABNORMAL HIGH (ref 0–5)
Lymphocytes Relative: 37 % (ref 12–46)
Monocytes Absolute: 0.7 10*3/uL (ref 0.1–1.0)
Monocytes Relative: 8 % (ref 3–12)
Neutro Abs: 4.2 10*3/uL (ref 1.7–7.7)
Neutrophils Relative %: 46 % (ref 43–77)

## 2012-03-23 LAB — BASIC METABOLIC PANEL
CO2: 24 mEq/L (ref 19–32)
Calcium: 9.3 mg/dL (ref 8.4–10.5)
Creatinine, Ser: 0.93 mg/dL (ref 0.50–1.10)
Glucose, Bld: 153 mg/dL — ABNORMAL HIGH (ref 70–99)

## 2012-03-23 LAB — CBC
MCH: 20.5 pg — ABNORMAL LOW (ref 26.0–34.0)
MCV: 68.5 fL — ABNORMAL LOW (ref 78.0–100.0)
MCV: 69.4 fL — ABNORMAL LOW (ref 78.0–100.0)
Platelets: 272 10*3/uL (ref 150–400)
Platelets: 291 10*3/uL (ref 150–400)
RBC: 4.61 MIL/uL (ref 3.87–5.11)
RBC: 4.68 MIL/uL (ref 3.87–5.11)
RDW: 18.4 % — ABNORMAL HIGH (ref 11.5–15.5)
WBC: 9.1 10*3/uL (ref 4.0–10.5)

## 2012-03-23 LAB — GLUCOSE, CAPILLARY: Glucose-Capillary: 383 mg/dL — ABNORMAL HIGH (ref 70–99)

## 2012-03-23 MED ORDER — ENOXAPARIN SODIUM 40 MG/0.4ML ~~LOC~~ SOLN
40.0000 mg | SUBCUTANEOUS | Status: DC
  Administered 2012-03-23: 40 mg via SUBCUTANEOUS
  Filled 2012-03-23 (×2): qty 0.4

## 2012-03-23 MED ORDER — ADULT MULTIVITAMIN W/MINERALS CH
1.0000 | ORAL_TABLET | Freq: Every day | ORAL | Status: DC
  Administered 2012-03-24: 1 via ORAL
  Filled 2012-03-23: qty 1

## 2012-03-23 MED ORDER — INSULIN ASPART 100 UNIT/ML ~~LOC~~ SOLN
0.0000 [IU] | Freq: Every day | SUBCUTANEOUS | Status: DC
  Administered 2012-03-23: 5 [IU] via SUBCUTANEOUS

## 2012-03-23 MED ORDER — LEVOFLOXACIN IN D5W 750 MG/150ML IV SOLN: 750.0000 mg | INTRAVENOUS | Status: DC

## 2012-03-23 MED ORDER — PANTOPRAZOLE SODIUM 40 MG PO TBEC
40.0000 mg | DELAYED_RELEASE_TABLET | Freq: Every day | ORAL | Status: DC
  Administered 2012-03-23 – 2012-03-24 (×2): 40 mg via ORAL
  Filled 2012-03-23 (×2): qty 1

## 2012-03-23 MED ORDER — FERROUS FUMARATE 325 (106 FE) MG PO TABS
1.0000 | ORAL_TABLET | Freq: Every day | ORAL | Status: DC
  Administered 2012-03-24: 106 mg via ORAL
  Filled 2012-03-23: qty 1

## 2012-03-23 MED ORDER — HYDROCODONE-ACETAMINOPHEN 5-325 MG PO TABS: 1.0000 | ORAL_TABLET | Freq: Four times a day (QID) | ORAL | Status: DC | PRN

## 2012-03-23 MED ORDER — METHYLPREDNISOLONE SODIUM SUCC 125 MG IJ SOLR
125.0000 mg | Freq: Once | INTRAMUSCULAR | Status: AC
  Administered 2012-03-23: 125 mg via INTRAVENOUS
  Filled 2012-03-23: qty 2

## 2012-03-23 MED ORDER — LORATADINE 10 MG PO TABS
10.0000 mg | ORAL_TABLET | Freq: Every day | ORAL | Status: DC
  Administered 2012-03-23 – 2012-03-24 (×2): 10 mg via ORAL
  Filled 2012-03-23 (×2): qty 1

## 2012-03-23 MED ORDER — ALBUTEROL SULFATE (5 MG/ML) 0.5% IN NEBU
INHALATION_SOLUTION | RESPIRATORY_TRACT | Status: AC
  Administered 2012-03-23: 07:00:00
  Filled 2012-03-23: qty 1

## 2012-03-23 MED ORDER — ACETAMINOPHEN 650 MG RE SUPP: 650.0000 mg | Freq: Four times a day (QID) | RECTAL | Status: DC | PRN

## 2012-03-23 MED ORDER — POTASSIUM CHLORIDE CRYS ER 20 MEQ PO TBCR
40.0000 meq | EXTENDED_RELEASE_TABLET | Freq: Once | ORAL | Status: AC
  Administered 2012-03-23: 40 meq via ORAL
  Filled 2012-03-23: qty 2

## 2012-03-23 MED ORDER — LEVALBUTEROL HCL 1.25 MG/0.5ML IN NEBU
1.2500 mg | INHALATION_SOLUTION | Freq: Once | RESPIRATORY_TRACT | Status: AC
  Administered 2012-03-23: 1.25 mg via RESPIRATORY_TRACT
  Filled 2012-03-23: qty 0.5

## 2012-03-23 MED ORDER — METHYLPREDNISOLONE SODIUM SUCC 40 MG IJ SOLR: 40.0000 mg | Freq: Two times a day (BID) | INTRAMUSCULAR | Status: DC

## 2012-03-23 MED ORDER — ALBUTEROL SULFATE (5 MG/ML) 0.5% IN NEBU
5.0000 mg | INHALATION_SOLUTION | Freq: Once | RESPIRATORY_TRACT | Status: AC
  Administered 2012-03-23: 5 mg via RESPIRATORY_TRACT
  Filled 2012-03-23: qty 1

## 2012-03-23 MED ORDER — LEVOFLOXACIN IN D5W 750 MG/150ML IV SOLN
750.0000 mg | INTRAVENOUS | Status: AC
  Administered 2012-03-23: 750 mg via INTRAVENOUS
  Filled 2012-03-23: qty 150

## 2012-03-23 MED ORDER — FERROUS FUMARATE 325 (106 FE) MG PO TABS: 1.0000 | ORAL_TABLET | Freq: Every day | ORAL | Status: DC

## 2012-03-23 MED ORDER — IPRATROPIUM BROMIDE 0.02 % IN SOLN
0.5000 mg | Freq: Once | RESPIRATORY_TRACT | Status: AC
  Administered 2012-03-23: 0.5 mg via RESPIRATORY_TRACT
  Filled 2012-03-23: qty 2.5

## 2012-03-23 MED ORDER — FOLIC ACID 400 MCG PO TABS
400.0000 ug | ORAL_TABLET | Freq: Every day | ORAL | Status: DC
Start: 2012-03-23 — End: 2012-03-23

## 2012-03-23 MED ORDER — LORAZEPAM 2 MG/ML IJ SOLN
1.0000 mg | Freq: Four times a day (QID) | INTRAMUSCULAR | Status: DC | PRN
  Administered 2012-03-23: 1 mg via INTRAVENOUS
  Filled 2012-03-23: qty 1

## 2012-03-23 MED ORDER — HYDROMORPHONE HCL PF 1 MG/ML IJ SOLN: 1.0000 mg | INTRAMUSCULAR | Status: DC | PRN

## 2012-03-23 MED ORDER — LEVALBUTEROL HCL 1.25 MG/0.5ML IN NEBU
1.2500 mg | INHALATION_SOLUTION | Freq: Four times a day (QID) | RESPIRATORY_TRACT | Status: DC
  Administered 2012-03-23 – 2012-03-24 (×3): 1.25 mg via RESPIRATORY_TRACT
  Filled 2012-03-23 (×7): qty 0.5

## 2012-03-23 MED ORDER — ONDANSETRON HCL 4 MG/2ML IJ SOLN: 4.0000 mg | Freq: Four times a day (QID) | INTRAMUSCULAR | Status: DC | PRN

## 2012-03-23 MED ORDER — DIPHENHYDRAMINE HCL 25 MG PO CAPS
25.0000 mg | ORAL_CAPSULE | Freq: Once | ORAL | Status: AC
  Administered 2012-03-23: 25 mg via ORAL
  Filled 2012-03-23: qty 1

## 2012-03-23 MED ORDER — BUDESONIDE 0.25 MG/2ML IN SUSP
0.2500 mg | Freq: Two times a day (BID) | RESPIRATORY_TRACT | Status: DC
  Administered 2012-03-23 – 2012-03-24 (×2): 0.25 mg via RESPIRATORY_TRACT
  Filled 2012-03-23 (×4): qty 2

## 2012-03-23 MED ORDER — MULTIVITAMINS PO CAPS
1.0000 | ORAL_CAPSULE | Freq: Every day | ORAL | Status: DC
Start: 2012-03-23 — End: 2012-03-23

## 2012-03-23 MED ORDER — MONTELUKAST SODIUM 10 MG PO TABS
10.0000 mg | ORAL_TABLET | Freq: Every day | ORAL | Status: DC
  Administered 2012-03-23: 10 mg via ORAL
  Filled 2012-03-23 (×2): qty 1

## 2012-03-23 MED ORDER — BUDESONIDE-FORMOTEROL FUMARATE 160-4.5 MCG/ACT IN AERO: 2.0000 | INHALATION_SPRAY | Freq: Two times a day (BID) | RESPIRATORY_TRACT | Status: DC

## 2012-03-23 MED ORDER — LEVALBUTEROL HCL 1.25 MG/0.5ML IN NEBU
1.2500 mg | INHALATION_SOLUTION | RESPIRATORY_TRACT | Status: DC
  Administered 2012-03-23: 1.25 mg via RESPIRATORY_TRACT

## 2012-03-23 MED ORDER — LEVALBUTEROL HCL 0.63 MG/3ML IN NEBU: 0.6300 mg | INHALATION_SOLUTION | RESPIRATORY_TRACT | Status: DC | PRN

## 2012-03-23 MED ORDER — LEVALBUTEROL HCL 1.25 MG/0.5ML IN NEBU
1.2500 mg | INHALATION_SOLUTION | RESPIRATORY_TRACT | Status: DC
  Filled 2012-03-23: qty 0.5

## 2012-03-23 MED ORDER — HYDROCHLOROTHIAZIDE 25 MG PO TABS
25.0000 mg | ORAL_TABLET | Freq: Every day | ORAL | Status: DC
  Administered 2012-03-23 – 2012-03-24 (×2): 25 mg via ORAL
  Filled 2012-03-23 (×2): qty 1

## 2012-03-23 MED ORDER — ALBUTEROL (5 MG/ML) CONTINUOUS INHALATION SOLN
15.0000 mg/h | INHALATION_SOLUTION | RESPIRATORY_TRACT | Status: DC
  Administered 2012-03-23: 15 mg/h via RESPIRATORY_TRACT

## 2012-03-23 MED ORDER — IRBESARTAN-HYDROCHLOROTHIAZIDE 300-25 MG PO TABS: 1.0000 | ORAL_TABLET | Freq: Every day | ORAL | Status: DC

## 2012-03-23 MED ORDER — LEVALBUTEROL HCL 0.63 MG/3ML IN NEBU
0.6300 mg | INHALATION_SOLUTION | RESPIRATORY_TRACT | Status: DC | PRN
  Filled 2012-03-23: qty 3

## 2012-03-23 MED ORDER — ONDANSETRON HCL 4 MG PO TABS: 4.0000 mg | ORAL_TABLET | Freq: Four times a day (QID) | ORAL | Status: DC | PRN

## 2012-03-23 MED ORDER — ACETAMINOPHEN 325 MG PO TABS: 650.0000 mg | ORAL_TABLET | Freq: Four times a day (QID) | ORAL | Status: DC | PRN

## 2012-03-23 MED ORDER — MAGNESIUM SULFATE 40 MG/ML IJ SOLN
2.0000 g | Freq: Once | INTRAMUSCULAR | Status: AC
  Administered 2012-03-23: 2 g via INTRAVENOUS
  Filled 2012-03-23 (×2): qty 50

## 2012-03-23 MED ORDER — FOLIC ACID 0.5 MG HALF TAB
0.5000 mg | ORAL_TABLET | Freq: Every day | ORAL | Status: DC
  Administered 2012-03-24: 0.5 mg via ORAL
  Filled 2012-03-23: qty 1

## 2012-03-23 MED ORDER — INSULIN ASPART 100 UNIT/ML ~~LOC~~ SOLN
0.0000 [IU] | Freq: Three times a day (TID) | SUBCUTANEOUS | Status: DC
  Administered 2012-03-24: 8 [IU] via SUBCUTANEOUS
  Administered 2012-03-24: 5 [IU] via SUBCUTANEOUS

## 2012-03-23 MED ORDER — SODIUM CHLORIDE 0.9 % IJ SOLN
INTRAMUSCULAR | Status: AC
  Administered 2012-03-23: 10 mL
  Filled 2012-03-23: qty 10

## 2012-03-23 MED ORDER — SODIUM CHLORIDE 0.9 % IV SOLN
1000.0000 mL | INTRAVENOUS | Status: DC
  Administered 2012-03-23: 1000 mL via INTRAVENOUS

## 2012-03-23 MED ORDER — IRBESARTAN 300 MG PO TABS
300.0000 mg | ORAL_TABLET | Freq: Every day | ORAL | Status: DC
  Administered 2012-03-23 – 2012-03-24 (×2): 300 mg via ORAL
  Filled 2012-03-23 (×2): qty 1

## 2012-03-23 MED ORDER — PREDNISONE 20 MG PO TABS
60.0000 mg | ORAL_TABLET | Freq: Once | ORAL | Status: AC
  Administered 2012-03-23: 60 mg via ORAL
  Filled 2012-03-23: qty 3

## 2012-03-23 MED ORDER — SODIUM CHLORIDE 0.9 % IV SOLN
1000.0000 mL | Freq: Once | INTRAVENOUS | Status: AC
  Administered 2012-03-23: 1000 mL via INTRAVENOUS

## 2012-03-23 MED ORDER — METHYLPREDNISOLONE SODIUM SUCC 125 MG IJ SOLR
60.0000 mg | Freq: Four times a day (QID) | INTRAMUSCULAR | Status: DC
  Administered 2012-03-23: 21:00:00 via INTRAVENOUS
  Administered 2012-03-24 (×2): 60 mg via INTRAVENOUS
  Filled 2012-03-23 (×3): qty 0.96
  Filled 2012-03-23: qty 2
  Filled 2012-03-23: qty 0.96
  Filled 2012-03-23: qty 2
  Filled 2012-03-23: qty 0.96

## 2012-03-23 MED ORDER — METHYLPREDNISOLONE SODIUM SUCC 40 MG IJ SOLR
40.0000 mg | Freq: Two times a day (BID) | INTRAMUSCULAR | Status: DC
  Filled 2012-03-23 (×2): qty 1

## 2012-03-23 MED ORDER — HYDROCOD POLST-CHLORPHEN POLST 10-8 MG/5ML PO LQCR
5.0000 mL | Freq: Two times a day (BID) | ORAL | Status: DC
  Administered 2012-03-23 – 2012-03-24 (×3): 5 mL via ORAL
  Filled 2012-03-23 (×3): qty 5

## 2012-03-23 MED ORDER — IPRATROPIUM BROMIDE 0.02 % IN SOLN
0.5000 mg | Freq: Four times a day (QID) | RESPIRATORY_TRACT | Status: DC
  Administered 2012-03-23 – 2012-03-24 (×3): 0.5 mg via RESPIRATORY_TRACT
  Filled 2012-03-23 (×3): qty 2.5

## 2012-03-23 MED ORDER — BUDESONIDE-FORMOTEROL FUMARATE 80-4.5 MCG/ACT IN AERO
2.0000 | INHALATION_SPRAY | Freq: Two times a day (BID) | RESPIRATORY_TRACT | Status: DC
Start: 2012-03-23 — End: 2012-03-23

## 2012-03-23 MED ORDER — LEVOFLOXACIN 500 MG PO TABS
500.0000 mg | ORAL_TABLET | Freq: Every day | ORAL | Status: DC
  Administered 2012-03-24: 500 mg via ORAL
  Filled 2012-03-23 (×2): qty 1

## 2012-03-23 MED ORDER — ALBUTEROL SULFATE (5 MG/ML) 0.5% IN NEBU
INHALATION_SOLUTION | RESPIRATORY_TRACT | Status: AC
  Filled 2012-03-23: qty 3

## 2012-03-23 MED ORDER — FERROUS FUMARATE 325 (106 FE) MG PO TABS
1.0000 | ORAL_TABLET | Freq: Every day | ORAL | Status: DC
  Filled 2012-03-23: qty 1

## 2012-03-23 MED ORDER — ALUM & MAG HYDROXIDE-SIMETH 200-200-20 MG/5ML PO SUSP: 30.0000 mL | Freq: Four times a day (QID) | ORAL | Status: DC | PRN

## 2012-03-23 MED ORDER — IPRATROPIUM BROMIDE 0.02 % IN SOLN: 0.5000 mg | RESPIRATORY_TRACT | Status: DC

## 2012-03-23 NOTE — ED Notes (Signed)
Respiratory called and they will see patient asap.

## 2012-03-23 NOTE — ED Notes (Addendum)
First meeting with patient. Patient resting at this time. Respirations remain labored and expository wheezing. Patient remains on monitor and 2L oxygen with sats of 98%.

## 2012-03-23 NOTE — ED Notes (Signed)
Peak flow done by Sharlot Gowda, RRT. Current peak flow was 200, 50%.

## 2012-03-23 NOTE — ED Notes (Signed)
Two unsuccessful IV attempts made by this RN. Another RN attempting access at present. IV team paged. Dr. Juleen China made aware.

## 2012-03-23 NOTE — ED Provider Notes (Signed)
Patient with a hx sig for asthma, DM, COPD, & morbid obesity was placed in CDU on asthma protocol by Remi Haggard PA-C. Patient care resumed from Pod A .  Patient is here for breathing treatments and has received 1 nebulizer tx last night per EMS & home Xopenex throughout the night, today 1 neb in route via EMS, double Xopenex dose on arrival (HR 130 prior to dosing), given 5mg  albuterol &.5 Atrovent, 60 prednisone.  Patient re-evaluated and is still having audible expiratory wheezing. Pt has a history of multiple ICU admits & has been intubated twice before in the past. Respiratory consulted to treat patient. Dispo pending pt response to hour long treatment. with no new complaints or concerns at this time. On exam: hemodynamically stable, NAD, heart w/ RRR, lungs CTAB, Chest & abd non-tender, no peripheral edema or calf tenderness.   12:29 PM  Pt has been treated in the CDU with an hour long treatment. Peak flow is at 18% and per respiratory therapy & the pt does not appear to be improving from the tx. [facility] has been paged to admit the patient for asthma exacerbation. (PCP Dr. Tresa Endo at [facility])   12:59 PM  The patient appears reasonably stabilized for admission considering the current resources, flow, and capabilities available in the ED at this time, and I doubt any other [facility] requiring further screening and/or treatment in the ED prior to admission. Admit [facility] 5 Dr. Isidoro Donning, asthma exacerbation   Jaci Carrel, [place] 03/23/12 1259

## 2012-03-23 NOTE — ED Notes (Signed)
Respiratory at bedside.

## 2012-03-23 NOTE — ED Notes (Signed)
Diet Tray Ordered to [id number]

## 2012-03-23 NOTE — ED Notes (Signed)
Per EMS, pt had wheezing and SOB last night approximately at 2300. She was given a breathing treatment by EMS and refused to come to the hospital. Then this AM, EMS was called back out to pts home because of wheezing and SOB. She was given 5mg  Albuterol breathing tx. Breathing tx helped a little bit. No IV or additional medication. Vitals: SBP 160, 120, 24.

## 2012-03-23 NOTE — ED Provider Notes (Signed)
History     CSN: [id number]  Arrival date & time 03/23/12  0630   None     Chief Complaint  Patient presents with  . Asthma  . Shortness of Breath    (Consider location/radiation/quality/duration/timing/severity/associated sxs/prior treatment) Patient is a 36 y.o. female presenting with asthma and shortness of breath. The history is provided by the patient. No language interpreter was used.  Asthma This is a chronic problem. Associated symptoms include coughing. Pertinent negatives include no abdominal pain, congestion, fever, headaches, nausea or vomiting.  Shortness of Breath  Associated symptoms include cough, shortness of breath and wheezing. Pertinent negatives include no fever and no rhinorrhea. Her past medical history is significant for asthma.   36 year old obese female presents to the ER via EMS with asthma exacerbation. Heart rates in the 130s breathing labored at 20 times a minute. O2 sat 96% on room air. States that around 11:00 last night her wheezing became worse and EMS came to her house. They gave her an albuterol treatment and she did not feel she needed to come to the hospital at that point. This morning the wheezing was much worse she called EMS and was transported to the ER. States that she had a cold for the past week but it improved. Last admission for asthma was in January here.  She is on Xopenex treatments at home. She received one albuterol treatment in route which made her heart rate increased to 130 tachycardia rhythm. Patient has been traveling from [place] to [place] Taking care of her parents in [place] and taking care of her sister in [place]. States that she does have a doctor in [place] but her last admission was to [facility] in January. She has an appointment with [facility] care for the first time on Monday. pmh listed below.   Past Medical History  Diagnosis Date  . Asthma   . Hypertension   . Chronic anemia   . Abnormal TSH   .  Morbid obesity   . GERD (gastroesophageal reflux disease)   . Seasonal allergies   . Nasal polyps   . Diabetes mellitus     type 2  . Chronic bronchitis   . Sleep apnea   . Shortness of breath 12/14/11    "related to how bad my asthma is; sometimes @ rest, lying down, w/exertion"  . Pneumonia   . Blood transfusion   . Inguinal hernia unilateral, non-recurrent     right  . Kidney infection 2008  . Headache   . Anxiety   . Depression   . PTSD (post-traumatic stress disorder)     "related to prior hospitalizations; things that happened when I was in hospital"    Past Surgical History  Procedure Date  . Cervical cerclage 1999; 2002  . Tubal ligation 2002    Family History  Problem Relation Age of Onset  . Diabetes Mother   . Heart disease Father     History  Substance Use Topics  . Smoking status: Never Smoker   . Smokeless tobacco: Never Used  . Alcohol Use: 0.6 oz/week    1 Glasses of wine per week     12/14/11 "only on birthdays; parties, and such"    OB History    Grav Para Term Preterm Abortions TAB SAB Ect Mult Living                  Review of Systems  Constitutional: Negative.  Negative for fever.  HENT: Negative.  Negative for ear pain, congestion, rhinorrhea, trouble swallowing and sinus pressure.   Eyes: Negative.   Respiratory: Positive for cough, shortness of breath and wheezing.   Cardiovascular: Negative.   Gastrointestinal: Negative.  Negative for nausea, vomiting and abdominal pain.  Skin: Negative.   Neurological: Negative.  Negative for headaches.  Psychiatric/Behavioral: Negative.   All other systems reviewed and are negative.    Allergies  Aspirin; Ibuprofen; Shellfish allergy; Halls cough drops; and Other  Home Medications   Current Outpatient Rx  Name Route Sig Dispense Refill  . ALBUTEROL SULFATE (2.5 MG/3ML) 0.083% IN NEBU Nebulization Take 3 mLs (2.5 mg total) by nebulization every 4 (four) hours as needed for wheezing or  shortness of breath. 75 mL 12  . BUDESONIDE-FORMOTEROL FUMARATE 160-4.5 MCG/ACT IN AERO Inhalation Inhale 2 puffs into the lungs 2 (two) times daily. 1 Inhaler 12  . DIPHENHYDRAMINE HCL 50 MG PO TABS Oral Take 50 mg by mouth at bedtime as needed. For allergies     . FERROUS FUMARATE 325 (106 FE) MG PO TABS Oral Take 1 tablet by mouth daily.    Marland Kitchen FLUTICASONE PROPIONATE 50 MCG/ACT NA SUSP Nasal Place 2 sprays into the nose daily.      Marland Kitchen FOLIC ACID 400 MCG PO TABS Oral Take 400 mcg by mouth daily.      . IRBESARTAN-HYDROCHLOROTHIAZIDE 300-25 MG PO TABS Oral Take 1 tablet by mouth daily.      Marland Kitchen LEVALBUTEROL HCL 0.63 MG/3ML IN NEBU Nebulization Take 3 mLs (0.63 mg total) by nebulization every 4 (four) hours as needed for wheezing. 3 mL 12  . LORATADINE 10 MG PO TABS Oral Take 10 mg by mouth daily.      Marland Kitchen METFORMIN HCL 1000 MG PO TABS Oral Take 1,000 mg by mouth 2 (two) times daily with a meal. Takes only when taking Prednisone taper to avoid elevation of blood sugar levels    . MULTIVITAMINS PO CAPS Oral Take 1 capsule by mouth daily.      Marland Kitchen PANTOPRAZOLE SODIUM 40 MG PO TBEC Oral Take 40 mg by mouth daily.     Marland Kitchen PREDNISONE 10 MG PO TABS  Takes 6 tablets for 3 days, then 5 tablets for 3 days, then 4 tablets for 3 days, then 3 tablets for 3 days, then 1 tabs for 3 days, then 1 tab for 3 days, and then stop. 100 tablet 0  . SALINE NASAL SPRAY NA Nasal Place 1 spray into the nose 3 (three) times daily as needed.      Marland Kitchen TIOTROPIUM BROMIDE MONOHYDRATE 18 MCG IN CAPS Inhalation Place 18 mcg into inhaler and inhale daily.       There were no vitals taken for this visit.  Physical Exam  Nursing note and vitals reviewed. Constitutional: She is oriented to person, place, and time. She appears well-developed and well-nourished.  HENT:  Head: Normocephalic.  Eyes: Conjunctivae and EOM are normal. Pupils are equal, round, and reactive to light.  Neck: Normal range of motion. Neck supple.  Cardiovascular:  Normal heart sounds and intact distal pulses.  Tachycardia present.  Exam reveals no gallop.   No murmur heard. Pulmonary/Chest: Tachypnea noted. She has wheezes in the right upper field, the right middle field, the right lower field, the left upper field, the left middle field and the left lower field. She has no rhonchi. She has no rales.  Abdominal: Soft.  Musculoskeletal: Normal range of motion.  Neurological: She is alert  and oriented to person, place, and time.  Skin: Skin is warm and dry.  Psychiatric: She has a normal mood and affect.    ED Course  Procedures (including critical care time)  Labs Reviewed - No data to display No results found.   No diagnosis found. 745 patient continues to wheeze in all 4 lobes. Heart rate 120. We'll give one more breathing treatment.   MDM  Moved to CDU on wheezing protocol.  Continues to wheeze after 3 tmts.  Recent upper respiratory infection.  Afebrile HR 118 ST.  Will add magnesiuim if we can get an IV started.  O2 sats 95% on r/a.  pmh asthma, hypertension, obese, gerd, bronchits, sleep apnea, pneumonia anemia.   Labs Reviewed  CBC - Abnormal; Notable for the following:    Hemoglobin 9.5 (*)    HCT 31.6 (*)    MCV 68.5 (*)    MCH 20.6 (*)    RDW 18.4 (*)    All other components within normal limits  DIFFERENTIAL - Abnormal; Notable for the following:    Eosinophils Relative 9 (*)    Eosinophils Absolute 0.8 (*)    All other components within normal limits  BASIC METABOLIC PANEL - Abnormal; Notable for the following:    Potassium 3.1 (*)    Glucose, Bld 153 (*)    GFR calc non Af Amer 78 (*)    All other components within normal limits          Remi Haggard, NP 03/23/12 1019

## 2012-03-23 NOTE — ED Provider Notes (Signed)
Medical screening examination/treatment/procedure(s) were performed by non-physician practitioner and as supervising physician I was immediately available for consultation/collaboration.  Raeford Razor, MD 03/23/12 1651

## 2012-03-23 NOTE — ED Notes (Signed)
Diet tray ordered 

## 2012-03-23 NOTE — H&P (Signed)
History and Physical       Hospital Admission Note Date: 03/23/2012  Patient name: [name] [name] Medical record number: [id number] Date of birth: 10-Jun-1976 Age: 36 y.o. Gender: female PCP: PCP Dr. Tresa Endo with [name]  Chief Complaint:  Shortness of breath with audible diffuse wheezing bilaterally worsening in the last 2-3 days  HPI: Patient is a 36 year old African American female with history of asthma, diabetes, morbid obesity, hypertension, chronic anemia presented to [facility] ED with shortness of breath, cough and audibly wheezing. History was obtained from the patient who stated that she's been having worsening of coughing and wheezing for the last 2-3 days. She ran out of the Symbicort inhaler 2 days ago and was using her Xopenex inhaler every hour until this morning when she called EMS. Patient appears to have recurrent admissions for asthma exacerbation and states that she had 2 intubations in the past for status asthmaticus. In the ED patient was noted to have HR in 130s with labored breathing however maintaining oxygen saturation. Patient had called EMS last night as well, however she improved some with albuterol treatment and decided not to come to the hospital at that point. This morning however she continued to get worse when she called EMS again and patient was transported to [facility] ED. She denies any fevers or chills however states that she's been having productive cough with yellowish phlegm. Patient also feels stressed out with her family issues taking care of her parents in [place] and her sister in [place]. Patient states that she had an appointment with [name] pulmonology on Monday, 03/27/2012.   Review of Systems: Positives are in  [place] Constitutional: Denies fever, chills, diaphoresis, appetite change and fatigue.  HEENT: Denies photophobia, eye pain, redness, hearing loss, ear pain, + congestion, sore throat,  rhinorrhea, sneezing, mouth sores, Respiratory: Please see history of present illness    Cardiovascular: Denies chest pain, palpitations and leg swelling.  Gastrointestinal: Denies nausea, vomiting, abdominal pain, diarrhea, constipation, blood in stool and abdominal distention.  Genitourinary: Denies dysuria, urgency, frequency, hematuria, flank pain and difficulty urinating.  Musculoskeletal: Denies myalgias, back pain, joint swelling, arthralgias and gait problem.  Skin: Denies pallor, rash and wound.  Neurological: Denies dizziness, seizures, syncope, weakness, light-headedness, numbness and headaches.  Hematological: Denies adenopathy. Easy bruising, personal or family bleeding history  Psychiatric/Behavioral: Denies suicidal ideation, mood changes, confusion, nervousness, sleep disturbance and agitation  Past Medical History: Past Medical History  Diagnosis Date  . Asthma   . Hypertension   . Chronic anemia   . Abnormal TSH   . Morbid obesity   . GERD (gastroesophageal reflux disease)   . Seasonal allergies   . Nasal polyps   . Diabetes mellitus     type 2  . Chronic bronchitis   . Sleep apnea   . Shortness of breath 12/14/11    "related to how bad my asthma is; sometimes @ rest, lying down, w/exertion"  . Pneumonia   . Blood transfusion   . Inguinal hernia unilateral, non-recurrent     right  . Kidney infection 2008  . Headache   . Anxiety   . Depression   . PTSD (post-traumatic stress disorder)     "related to prior hospitalizations; things that happened when I was in hospital"   Past Surgical History  Procedure Date  . Cervical cerclage 1999; 2002  . Tubal ligation 2002    Medications: Prior to Admission medications   Medication Sig Start Date End Date Taking? Authorizing Provider  albuterol (PROVENTIL) (2.5 MG/3ML) 0.083% nebulizer solution Take 3 mLs (2.5 mg total) by nebulization every 4 (four) hours as needed for wheezing or shortness of breath. 01/26/12  01/25/13 Yes Richarda Overlie, MD  budesonide-formoterol (SYMBICORT) 160-4.5 MCG/ACT inhaler Inhale 2 puffs into the lungs 2 (two) times daily. 12/15/11 12/14/12 Yes Marinda Elk, MD  diphenhydrAMINE (BENADRYL) 50 MG tablet Take 50 mg by mouth at bedtime as needed. For allergies    Yes Historical Provider, MD  ferrous fumarate (HEMOCYTE - 106 MG FE) 325 (106 FE) MG TABS Take 1 tablet by mouth daily.   Yes Historical Provider, MD  fluticasone (FLONASE) 50 MCG/ACT nasal spray Place 2 sprays into the nose daily.     Yes Historical Provider, MD  folic acid (FOLVITE) 400 MCG tablet Take 400 mcg by mouth daily.     Yes Historical Provider, MD  irbesartan-hydrochlorothiazide (AVALIDE) 300-25 MG per tablet Take 1 tablet by mouth daily.     Yes Historical Provider, MD  levalbuterol (XOPENEX) 0.63 MG/3ML nebulizer solution Take 3 mLs (0.63 mg total) by nebulization every 4 (four) hours as needed for wheezing. 12/14/11 12/13/12 Yes Fayrene Helper, PA-C  loratadine (CLARITIN) 10 MG tablet Take 10 mg by mouth daily.     Yes Historical Provider, MD  metFORMIN (GLUCOPHAGE) 1000 MG tablet Take 1,000 mg by mouth 2 (two) times daily with a meal. Takes only when taking Prednisone taper to avoid elevation of blood sugar levels   Yes Historical Provider, MD  Multiple Vitamin (MULTIVITAMIN) capsule Take 1 capsule by mouth daily.     Yes Historical Provider, MD  pantoprazole (PROTONIX) 40 MG tablet Take 40 mg by mouth daily.    Yes Historical Provider, MD  SALINE NASAL SPRAY NA Place 1 spray into the nose 3 (three) times daily as needed.     Yes Historical Provider, MD  tiotropium (SPIRIVA) 18 MCG inhalation capsule Place 18 mcg into inhaler and inhale daily.    Yes Historical Provider, MD    Allergies:   Allergies  Allergen Reactions  . Aspirin Anaphylaxis  . Ibuprofen Anaphylaxis  . Shellfish Allergy Hives and Swelling    Facial and oral swelling  . Halls Cough Drops Swelling    Tongue swelling  . Other Other (See  Comments)    Plastic thermometer covers cause sloughing off of skin on tongue and inside mouth    Social History:  reports that she has never smoked. She has never used smokeless tobacco. She reports that she drinks about .6 ounces of alcohol per week. She reports that she does not use illicit drugs.  Family History: Family History  Problem Relation Age of Onset  . Diabetes Mother   . Heart disease Father     Physical Exam: Blood pressure 143/75, pulse 129, temperature 98.5 F (36.9 C), temperature source Oral, resp. rate 18, last menstrual period 03/03/2012, SpO2 96.00%. General: Alert, awake, oriented x3, in moderate respiratory distress  HEENT: anicteric sclera, pink conjunctiva, pupils equal and reactive to light and accomodation Neck: supple, no masses or lymphadenopathy, no goiter, no bruits  Heart: Tachycardia, Regular rate and rhythm, without murmurs, rubs or gallops. Lungs: Diffuse wheezing bilaterally, audible Abdomen: Soft, nontender, nondistended, positive bowel sounds, no masses. Extremities: No clubbing, cyanosis or edema with positive pedal pulses. Neuro: Grossly intact, no focal neurological deficits, strength 5/5 upper and lower extremities bilaterally Psych: alert and oriented x 3, normal mood and affect Skin: no rashes or lesions, warm and dry   LABS on  Admission:  Basic Metabolic Panel:  Lab 03/23/12 4098  NA 140  K 3.1*  CL 103  CO2 24  GLUCOSE 153*  BUN 12  CREATININE 0.93  CALCIUM 9.3  MG --  PHOS --   CBC:  Lab 03/23/12 0705  WBC 9.1  NEUTROABS 4.2  HGB 9.5*  HCT 31.6*  MCV 68.5*  PLT 272     Radiological Exams on Admission: No results found.  Assessment/Plan Present on Admission:   .Asthma with status asthmaticus: With multiple admissions and history of intubations  - Admit to step down unit, start on scheduled to Xopenex and Atrovent nebs, IV Solu-Medrol, O2 supplementation, Levaquin - Placed her on when necessary Ativan due to  some anxiety component involved as well - Pulmonology/PCCM consult called, low threshold for intubation if she is not able to protect airway   .Tachycardia: Likely worsened secondary to albuterol treatments  - Change to Xopenex and Atrovent nebs, Ativan when necessary   .Hypokalemia: Replaced   .HTN (hypertension):  - Restart home medications   .Morbid obesity - Counseled on weight loss, diet control and exercise   .Type 2 diabetes mellitus - Placed on sliding scale insulin while inpatient, as blood sugars expected to be elevated with steroids   .Anemia: Chronic, monitor closely  DVT prophylaxis: Lovenox  CODE STATUS: Full CODE STATUS  Further plan will depend as patient's clinical course evolves and further radiologic and laboratory data become available.   @Time  Spent on Admission: 1 hour Korbyn Chopin M.D. [facility] 03/23/2012, 1:43 PM

## 2012-03-23 NOTE — Consult Note (Signed)
Name: [name] [name] MRN: [id number] DOB: 1976/05/12    LOS: 0  [facility] Note   History of Present Illness:  36 y/o F, non-smoker with PMH of DM, HTN, Morbid Obesity, Seasonal Allergies, sensitivity to aspirin, nasal polyps and Asthma with frequent flares presented to [facility] ED on 4/25 with increased wheezing, chest tightness, cough with yellow sputum, chills and decreased activity tolerance.   She indicates 2 weeks prior to admit, she had an upper respiratory infection that resolved with OTC treatment.  2 days PTA she noted increase in asthma symptoms that did not respond to nebulizer's or "increased use of Symbicort"..  Evening of 4/24 she called EMS for help and they gave her a neb that seemed to resolve symptoms.  She woke am of 4/25 and felt no better, prompting her to call EMS a second time.  In ED she was noted to have a peak flow of 200 (done after neb of xopenex).  She reports her normal peak flow is 200-250 (the latter being a "good" day).  She received CAT.  Repeat Peak flow 150>>>95 respectively.  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  PCCM consulted for Pulmonary assistance with Asthma.      Cultures:   Antibiotics: Levaquin (Acute asthma exac) a 4/25>>>  Tests / Events: 4/25 Peak flow>>>200>>>150>>>95  Past Medical History  Diagnosis Date  . Asthma   . Hypertension   . Chronic anemia   . Abnormal TSH   . Morbid obesity   . GERD (gastroesophageal reflux disease)   . Seasonal allergies   . Nasal polyps   . Diabetes mellitus     type 2  . Chronic bronchitis   . Sleep apnea   . Shortness of breath 12/14/11    "related to how bad my asthma is; sometimes @ rest, lying down, w/exertion"  . Pneumonia   . Blood transfusion   . Inguinal hernia unilateral, non-recurrent     right  . Kidney infection 2008  . Headache   . Anxiety   .  Depression   . PTSD (post-traumatic stress disorder)     "related to prior hospitalizations; things that happened when I was in hospital"    Past Surgical History  Procedure Date  . Cervical cerclage 1999; 2002  . Tubal ligation 2002    Prior to Admission medications   Medication Sig Start Date End Date Taking? Authorizing Provider  albuterol (PROVENTIL) (2.5 MG/3ML) 0.083% nebulizer solution Take 3 mLs (2.5 mg total) by nebulization every 4 (four) hours as needed for wheezing or shortness of breath. 01/26/12 01/25/13 Yes Richarda Overlie, MD  budesonide-formoterol (SYMBICORT) 160-4.5 MCG/ACT inhaler Inhale 2 puffs into the lungs 2 (two) times daily. 12/15/11 12/14/12 Yes Marinda Elk, MD  diphenhydrAMINE (BENADRYL) 50 MG tablet Take 50 mg by mouth at bedtime as needed. For allergies    Yes Historical Provider, MD  ferrous fumarate (HEMOCYTE - 106 MG FE) 325 (106 FE) MG TABS Take 1 tablet by mouth daily.   Yes Historical Provider, MD  fluticasone (FLONASE) 50 MCG/ACT nasal spray Place 2 sprays into the nose daily.     Yes Historical Provider, MD  folic acid (FOLVITE) 400 MCG tablet Take 400 mcg by mouth daily.     Yes Historical Provider, MD  irbesartan-hydrochlorothiazide (AVALIDE) 300-25 MG per tablet Take 1 tablet by mouth daily.  Yes Historical Provider, MD  levalbuterol (XOPENEX) 0.63 MG/3ML nebulizer solution Take 3 mLs (0.63 mg total) by nebulization every 4 (four) hours as needed for wheezing. 12/14/11 12/13/12 Yes Fayrene Helper, PA-C  loratadine (CLARITIN) 10 MG tablet Take 10 mg by mouth daily.     Yes Historical Provider, MD  metFORMIN (GLUCOPHAGE) 1000 MG tablet Take 1,000 mg by mouth 2 (two) times daily with a meal. Takes only when taking Prednisone taper to avoid elevation of blood sugar levels   Yes Historical Provider, MD  Multiple Vitamin (MULTIVITAMIN) capsule Take 1 capsule by mouth daily.     Yes Historical Provider, MD  pantoprazole (PROTONIX) 40 MG tablet Take 40 mg by  mouth daily.    Yes Historical Provider, MD  SALINE NASAL SPRAY NA Place 1 spray into the nose 3 (three) times daily as needed.     Yes Historical Provider, MD  tiotropium (SPIRIVA) 18 MCG inhalation capsule Place 18 mcg into inhaler and inhale daily.    Yes Historical Provider, MD    Allergies Allergies  Allergen Reactions  . Aspirin Anaphylaxis  . Ibuprofen Anaphylaxis  . Shellfish Allergy Hives and Swelling    Facial and oral swelling  . Halls Cough Drops Swelling    Tongue swelling  . Other Other (See Comments)    Plastic thermometer covers cause sloughing off of skin on tongue and inside mouth    Family History Family History  Problem Relation Age of Onset  . Diabetes Mother   . Heart disease Father     Social History  reports that she has never smoked. She has never used smokeless tobacco. She reports that she drinks about .6 ounces of alcohol per week. She reports that she does not use illicit drugs.  Review Of Systems:  Gen: Denies fever, weight change, fatigue, night sweats.  Positive chills.  HEENT: Denies blurred vision, double vision, hearing loss, tinnitus, sinus congestion, rhinorrhea, sore throat, neck stiffness, dysphagia PULM: See HPI.  No hemoptysis, chest pain.  CV: Denies chest pain, edema, orthopnea, paroxysmal nocturnal dyspnea, palpitations GI: Denies abdominal pain, nausea, vomiting, diarrhea, hematochezia, melena, constipation, change in bowel habits GU: Denies dysuria, hematuria, polyuria, oliguria, urethral discharge Endocrine: Denies hot or cold intolerance, polyuria, polyphagia or appetite change Derm: Denies rash, dry skin, scaling or peeling skin change Heme: Denies easy bruising, bleeding, bleeding gums Neuro: Denies headache, numbness, weakness, slurred speech, loss of memory or consciousness  Vital Signs: Temp:  [98.5 F (36.9 C)-98.9 F (37.2 C)] 98.5 F (36.9 C) (04/25 0721) Pulse Rate:  [115-135] 115  (04/25 1353) Resp:  [18-26] 20   (04/25 1353) BP: (117-159)/(56-98) 126/56 mmHg (04/25 1353) SpO2:  [90 %-100 %] 94 % (04/25 1353)    Physical Examination: General: morbidly obese, chronically ill in NAD Neuro: AAOx4, speech clear, MAE HEENT:  Mm pink/moist, fair dentition, thick neck, dark rings around eyes CV: s1s2 rrr, no m/r/g PULM: resp' s shallow/labored with activity, audible wheeze, wheezing throughout GI: obese, soft, bsx4 active Extremities: warm/dry  Labs    CBC  Lab 03/23/12 0705  HGB 9.5*  HCT 31.6*  WBC 9.1  PLT 272     BMET  Lab 03/23/12 0705  NA 140  K 3.1*  CL 103  CO2 24  GLUCOSE 153*  BUN 12  CREATININE 0.93  CALCIUM 9.3  MG --  PHOS --    Radiology: none    Assessment and Plan:   Asthma with status asthmaticus Assessment:  Acute asthma flare  in setting of what seems to be poorly controlled asthma at baseline.  She is morbidly obese and this component of restriction contributes to symptoms.  Peak flow in ED after neb 200, declined to 95 respectively.  She indicates she uses her symbicort BID & PRN.  Also, she has known severe GERD and has been told in the past that she has scarring on cords that needs to be reviewed.  Likely, there is also a component of vocal cord dysfunction if repetitively exposed to gastric secretions.  Has gained >200 lbs in last 12 years.   Plan: -BID PPI -hold Symbicort while inpatient -scheduled xopenex / atrovent Q6 with Q3 PRN xopenex -empiric abx -change to PO for 5 days -check pct -consider ENT / Bronch to evaluate vocal cords -solu-medrol 40 Q12 -bud[place]onide BID -singular   Other Medical Problems Below per [facility] Morbid obesity Type 2 diabetes mellitus Anemia Tachycardia Hypokalemia HTN (hypertension)      Canary Brim, NP-C [facility] Pgr: [phone]  03/23/2012, 2:14 PM    Seen on CCM rounds this morning with resident MD or ACNP above.  Pt examined and database reviewed. I agree with above findings,  assessment and plan as reflected in the note above.   Additional thoughts: Issues that might be contributing to her poor asthma control include 1) Medical noncompliance 2) Uncontrolled GERD 3) Component of VCD  Her hx of nasal polyps and aspirin sensitivity predict that she will respond favorably to leukotriene modifiers such as montelukast (Singulair)  Billy Fischer, MD;  PCCM service; Mobile [phone]

## 2012-03-24 DIAGNOSIS — J45901 Unspecified asthma with (acute) exacerbation: Secondary | ICD-10-CM

## 2012-03-24 LAB — CBC
Hemoglobin: 10.1 g/dL — ABNORMAL LOW (ref 12.0–15.0)
MCH: 20.4 pg — ABNORMAL LOW (ref 26.0–34.0)
MCV: 69.4 fL — ABNORMAL LOW (ref 78.0–100.0)
Platelets: 343 10*3/uL (ref 150–400)
RBC: 4.96 MIL/uL (ref 3.87–5.11)

## 2012-03-24 LAB — BASIC METABOLIC PANEL
CO2: 17 mEq/L — ABNORMAL LOW (ref 19–32)
Calcium: 9.2 mg/dL (ref 8.4–10.5)
Glucose, Bld: 254 mg/dL — ABNORMAL HIGH (ref 70–99)
Sodium: 136 mEq/L (ref 135–145)

## 2012-03-24 LAB — GLUCOSE, CAPILLARY: Glucose-Capillary: 243 mg/dL — ABNORMAL HIGH (ref 70–99)

## 2012-03-24 MED ORDER — HYDROCOD POLST-CHLORPHEN POLST 10-8 MG/5ML PO LQCR: 5.0000 mL | Freq: Two times a day (BID) | ORAL | Status: DC

## 2012-03-24 MED ORDER — LEVALBUTEROL HCL 1.25 MG/0.5ML IN NEBU
1.2500 mg | INHALATION_SOLUTION | Freq: Three times a day (TID) | RESPIRATORY_TRACT | Status: DC | PRN
  Filled 2012-03-24: qty 0.5

## 2012-03-24 MED ORDER — BUDESONIDE 0.25 MG/2ML IN SUSP
0.2500 mg | Freq: Two times a day (BID) | RESPIRATORY_TRACT | Status: DC | PRN
  Filled 2012-03-24: qty 2

## 2012-03-24 MED ORDER — BUDESONIDE-FORMOTEROL FUMARATE 160-4.5 MCG/ACT IN AERO: 2.0000 | INHALATION_SPRAY | Freq: Two times a day (BID) | RESPIRATORY_TRACT | Status: DC

## 2012-03-24 MED ORDER — ALPRAZOLAM 0.25 MG PO TABS: 0.2500 mg | ORAL_TABLET | Freq: Two times a day (BID) | ORAL | Status: DC | PRN

## 2012-03-24 MED ORDER — PREDNISONE 20 MG PO TABS
40.0000 mg | ORAL_TABLET | Freq: Every day | ORAL | Status: DC
  Administered 2012-03-24: 40 mg via ORAL
  Filled 2012-03-24 (×2): qty 2

## 2012-03-24 MED ORDER — IPRATROPIUM BROMIDE 0.02 % IN SOLN: 0.5000 mg | Freq: Four times a day (QID) | RESPIRATORY_TRACT | Status: DC | PRN

## 2012-03-24 MED ORDER — TIOTROPIUM BROMIDE MONOHYDRATE 18 MCG IN CAPS
18.0000 ug | ORAL_CAPSULE | Freq: Every day | RESPIRATORY_TRACT | Status: DC
  Administered 2012-03-24: 18 ug via RESPIRATORY_TRACT
  Filled 2012-03-24: qty 5

## 2012-03-24 MED ORDER — ALPRAZOLAM 0.25 MG PO TABS: 0.2500 mg | ORAL_TABLET | Freq: Two times a day (BID) | ORAL | Status: AC | PRN

## 2012-03-24 MED ORDER — PREDNISONE 20 MG PO TABS: 40.0000 mg | ORAL_TABLET | Freq: Every day | ORAL | Status: AC

## 2012-03-24 MED ORDER — LEVOFLOXACIN 500 MG PO TABS: 500.0000 mg | ORAL_TABLET | Freq: Every day | ORAL | Status: AC

## 2012-03-24 NOTE — Discharge Summary (Signed)
DISCHARGE SUMMARY  [name] [name]  MR#: [id number]  DOB:02-02-1976  Date of Admission: 03/23/2012 Date of Discharge: 03/24/2012  Attending Physician:Chosen Geske Butler Denmark, MD  Patient's PCP:No Pcp  Consults:  Billy Fischer, M.D.-pulmonary medicine  Pertinent discharge information: *Patient to followup with Dr. Corliss Blacker with pulmonary medicine after discharge. Office will call with appointment. *Pulmonary medicine recommends resuming Symbicort concurrently with short-term steroid therapy initiated at discharge. *Patient was not on anxiolytic medications prior to admission but requested this medicine to be continued after discharge. We have given her a one-time prescription for low dose Xanax to be used sparingly but only 15 tablets and no refills prescribed.  Discharge Diagnoses: Principal Problem:  *Asthma with status asthmaticus Active Problems:  Tachycardia  Acute respiratory failure with hypoxia  Type 2 diabetes mellitus  Hypokalemia  HTN (hypertension)  Morbid obesity  Anemia   Radiology: Dg Chest Port 1 View  03/23/2012  *RADIOLOGY REPORT*  Clinical Data: 36 year old female with asthma attack.  Recent productive cough and fever.  PORTABLE CHEST - 1 VIEW  Comparison: 01/26/2012 and earlier.  Findings: Portable semi upright AP view 1540 hours.  Lordotic view. Stable lung volumes.  Cardiac size and mediastinal contours are within normal limits.  No pneumothorax or effusion.  Allowing for portable technique, the lungs are clear.  IMPRESSION: No acute cardiopulmonary abnormality.  Original Report Authenticated By: Harley Hallmark, M.D.    Laboratory: Results for orders placed during the hospital encounter of 03/23/12 (from the past 48 hour(s))  CBC     Status: Abnormal   Collection Time   03/23/12  7:05 AM      Component Value Range Comment   WBC 9.1  4.0 - 10.5 (K/uL)    RBC 4.61  3.87 - 5.11 (MIL/uL)    Hemoglobin 9.5 (*) 12.0 - 15.0 (g/dL)    HCT 95.2 (*) 84.1 - 46.0 (%)    MCV  68.5 (*) 78.0 - 100.0 (fL)    MCH 20.6 (*) 26.0 - 34.0 (pg)    MCHC 30.1  30.0 - 36.0 (g/dL)    RDW 32.4 (*) 40.1 - 15.5 (%)    Platelets 272  150 - 400 (K/uL)   DIFFERENTIAL     Status: Abnormal   Collection Time   03/23/12  7:05 AM      Component Value Range Comment   Neutrophils Relative 46  43 - 77 (%)    Lymphocytes Relative 37  12 - 46 (%)    Monocytes Relative 8  3 - 12 (%)    Eosinophils Relative 9 (*) 0 - 5 (%)    Basophils Relative 0  0 - 1 (%)    Neutro Abs 4.2  1.7 - 7.7 (K/uL)    Lymphs Abs 3.4  0.7 - 4.0 (K/uL)    Monocytes Absolute 0.7  0.1 - 1.0 (K/uL)    Eosinophils Absolute 0.8 (*) 0.0 - 0.7 (K/uL)    Basophils Absolute 0.0  0.0 - 0.1 (K/uL)    RBC Morphology POLYCHROMASIA PRESENT      Smear Review LARGE PLATELETS PRESENT   PLATELETS APPEAR ADEQUATE  BASIC METABOLIC PANEL     Status: Abnormal   Collection Time   03/23/12  7:05 AM      Component Value Range Comment   Sodium 140  135 - 145 (mEq/L)    Potassium 3.1 (*) 3.5 - 5.1 (mEq/L)    Chloride 103  96 - 112 (mEq/L)    CO2 24  19 - 32 (mEq/L)  Glucose, Bld 153 (*) 70 - 99 (mg/dL)    BUN 12  6 - 23 (mg/dL)    Creatinine, Ser 1.61  0.50 - 1.10 (mg/dL)    Calcium 9.3  8.4 - 10.5 (mg/dL)    GFR calc non Af Amer 78 (*) >90 (mL/min)    GFR calc Af Amer >90  >90 (mL/min)   PROCALCITONIN     Status: Normal   Collection Time   03/23/12  3:55 PM      Component Value Range Comment   Procalcitonin <0.10     MRSA PCR SCREENING     Status: Normal   Collection Time   03/23/12  7:02 PM      Component Value Range Comment   MRSA by PCR NEGATIVE  NEGATIVE    CBC     Status: Abnormal   Collection Time   03/23/12  7:22 PM      Component Value Range Comment   WBC 9.6  4.0 - 10.5 (K/uL)    RBC 4.68  3.87 - 5.11 (MIL/uL)    Hemoglobin 9.6 (*) 12.0 - 15.0 (g/dL)    HCT 09.6 (*) 04.5 - 46.0 (%)    MCV 69.4 (*) 78.0 - 100.0 (fL)    MCH 20.5 (*) 26.0 - 34.0 (pg)    MCHC 29.5 (*) 30.0 - 36.0 (g/dL)    RDW 40.9 (*) 81.1 -  15.5 (%)    Platelets 291  150 - 400 (K/uL)   CREATININE, SERUM     Status: Normal   Collection Time   03/23/12  7:22 PM      Component Value Range Comment   Creatinine, Ser 0.63  0.50 - 1.10 (mg/dL)    GFR calc non Af Amer >90  >90 (mL/min)    GFR calc Af Amer >90  >90 (mL/min)   HEMOGLOBIN A1C     Status: Abnormal   Collection Time   03/23/12  7:22 PM      Component Value Range Comment   Hemoglobin A1C 7.4 (*) <5.7 (%)    Mean Plasma Glucose 166 (*) <117 (mg/dL)   GLUCOSE, CAPILLARY     Status: Abnormal   Collection Time   03/23/12  8:07 PM      Component Value Range Comment   Glucose-Capillary 325 (*) 70 - 99 (mg/dL)   GLUCOSE, CAPILLARY     Status: Abnormal   Collection Time   03/23/12  9:34 PM      Component Value Range Comment   Glucose-Capillary 383 (*) 70 - 99 (mg/dL)   BASIC METABOLIC PANEL     Status: Abnormal   Collection Time   03/24/12  4:30 AM      Component Value Range Comment   Sodium 136  135 - 145 (mEq/L)    Potassium 3.5  3.5 - 5.1 (mEq/L)    Chloride 101  96 - 112 (mEq/L)    CO2 17 (*) 19 - 32 (mEq/L)    Glucose, Bld 254 (*) 70 - 99 (mg/dL)    BUN 14  6 - 23 (mg/dL)    Creatinine, Ser 9.14  0.50 - 1.10 (mg/dL)    Calcium 9.2  8.4 - 10.5 (mg/dL)    GFR calc non Af Amer >90  >90 (mL/min)    GFR calc Af Amer >90  >90 (mL/min)   CBC     Status: Abnormal   Collection Time   03/24/12  4:30 AM      Component Value Range Comment  WBC 11.0 (*) 4.0 - 10.5 (K/uL)    RBC 4.96  3.87 - 5.11 (MIL/uL)    Hemoglobin 10.1 (*) 12.0 - 15.0 (g/dL)    HCT 78.4 (*) 69.6 - 46.0 (%)    MCV 69.4 (*) 78.0 - 100.0 (fL)    MCH 20.4 (*) 26.0 - 34.0 (pg)    MCHC 29.4 (*) 30.0 - 36.0 (g/dL)    RDW 29.5 (*) 28.4 - 15.5 (%)    Platelets 343  150 - 400 (K/uL)   GLUCOSE, CAPILLARY     Status: Abnormal   Collection Time   03/24/12  8:41 AM      Component Value Range Comment   Glucose-Capillary 243 (*) 70 - 99 (mg/dL)    Comment 1 Notify RN        Medication List  As of 03/24/2012  11:24 AM   TAKE these medications         albuterol (2.5 MG/3ML) 0.083% nebulizer solution   Commonly known as: PROVENTIL   Take 3 mLs (2.5 mg total) by nebulization every 4 (four) hours as needed for wheezing or shortness of breath.      ALPRAZolam 0.25 MG tablet   Commonly known as: XANAX   Take 1 tablet (0.25 mg total) by mouth 2 (two) times daily as needed for anxiety (Use sparingly).      budesonide-formoterol 160-4.5 MCG/ACT inhaler   Commonly known as: SYMBICORT   Inhale 2 puffs into the lungs 2 (two) times daily.      chlorpheniramine-HYDROcodone 10-8 MG/5ML Lqcr   Commonly known as: TUSSIONEX   Take 5 mLs by mouth every 12 (twelve) hours.      diphenhydrAMINE 50 MG tablet   Commonly known as: BENADRYL   Take 50 mg by mouth at bedtime as needed. For allergies        ferrous fumarate 325 (106 FE) MG Tabs   Commonly known as: HEMOCYTE - 106 mg FE   Take 1 tablet by mouth daily.      fluticasone 50 MCG/ACT nasal spray   Commonly known as: FLONASE   Place 2 sprays into the nose daily.      folic acid 400 MCG tablet   Commonly known as: FOLVITE   Take 400 mcg by mouth daily.      irbesartan-hydrochlorothiazide 300-25 MG per tablet   Commonly known as: AVALIDE   Take 1 tablet by mouth daily.      levalbuterol 0.63 MG/3ML nebulizer solution   Commonly known as: XOPENEX   Take 3 mLs (0.63 mg total) by nebulization every 4 (four) hours as needed for wheezing.      levofloxacin 500 MG tablet   Commonly known as: LEVAQUIN   Take 1 tablet (500 mg total) by mouth daily.      loratadine 10 MG tablet   Commonly known as: CLARITIN   Take 10 mg by mouth daily.      metFORMIN 1000 MG tablet   Commonly known as: GLUCOPHAGE   Take 1,000 mg by mouth 2 (two) times daily with a meal. Takes only when taking Prednisone taper to avoid elevation of blood sugar levels      multivitamin capsule   Take 1 capsule by mouth daily.      pantoprazole 40 MG tablet   Commonly known  as: PROTONIX   Take 40 mg by mouth daily.      predniSONE 20 MG tablet   Commonly known as: DELTASONE   Take 2 tablets (  40 mg total) by mouth daily with breakfast.      SALINE NASAL SPRAY NA   Place 1 spray into the nose 3 (three) times daily as needed.      tiotropium 18 MCG inhalation capsule   Commonly known as: SPIRIVA   Place 18 mcg into inhaler and inhale daily.             History of present illness: 36 year old female patient with known history of diabetes asthma morbid obesity who presented to [facility] with progressive shortness of breath and asthma exacerbation symptoms for 2-3 days. Patient endorsed progressive coughing and wheezing. She was using her usual home medication without relief. She ran out of Symbicort inhaler 2 days prior. Was using her Xopenex inhaler every hour until she called the paramedics to bring her to the hospital. Review of previous admission data shows the patient has had recurrent asthma exacerbations and has had 2 intubations in the past for status asthmaticus. In emergency department patient was tachycardic with heart rates in the 130s labored breathing to maintain appropriate oxygen saturations apparently before being brought to the hospital on the date of admission the night before she had called paramedics but received an albuterol treatment and improved so she decided not to come to the hospital at that time to she's not been having any constitutional symptoms such as fever or chills but she does endorse productive cough with yellow colored phlegm. She also endorses significant family stressors regarding family that lives both in [place] and in [place]. She reports she has an outpatient appointment with the [facility] pulmonologist group on 03/27/2012.  Hospital Course: Principal Problem:  *Asthma with status asthmaticus Active Problems:  Tachycardia  Acute respiratory failure with hypoxia  Type 2 diabetes mellitus  Hypokalemia  HTN  (hypertension)  Morbid obesity  Anemia  Status asthmaticus Because of history of prior intubations patient was admitted to the step down unit for closer monitoring. She was initiated on multiple nebulizer therapy and started on moderate dose IV Solu-Medrol. By the morning of discharge her wheezing had resolved although she did have some intermittent tachycardia. She was maintaining O2 saturations of 97-98% on room air and had no further labored respiratory effort or tachypnea. Patient reports that she is out of her Symbicort at home and requests a prescription at time of discharge. She was also evaluated by the pulmonologist prior to discharge. He recommends resuming Symbicort concurrently with prednisone which is being prescribed at discharge. He also recommends as a precaution continuing current Levaquin to cover any bronchitis symptoms. Chest x-ray did not reveal any pneumonia.  Anxiety and social stressors Patient has requested anxiety medication at discharge. She currently does not have a regular primary care physician. We have opted to give her a one-time prescription of Xanax 0.25 mg to be used twice a day when necessary sparingly. She has been prescribed 15 tablets only with no refills  Diabetes mellitus No significant hyperglycemia this admission. She will resume her usual metformin at discharge.  Morbid obesity/sleep apnea She was counseled by the pulmonologist regarding need for weight loss and how this condition contributes to her pulmonary issues  Day of Discharge BP 149/86  Pulse 120  Temp(Src) 98.3 F (36.8 C) (Oral)  Resp 20  Ht 5\' 6"  (1.676 m)  Wt 136.2 kg (300 lb 4.3 oz)  BMI 48.46 kg/m2  SpO2 100%  LMP 03/03/2012  Physical Exam:  General appearance: alert, cooperative, appears stated age and no distress Resp: clear  to auscultation bilaterally Cardio: regular rate and rhythm, slight resting tachycardia between 95 and 105 beats per minute, with exertion heart rate  occasionally goes up to 130 beats per minute the patient without symptoms such as chest pain or shortness of breath, S1, S2 normal, no murmur, click, rub or gallop GI: soft, non-tender; bowel sounds normal; no masses,  no organomegaly Extremities: extremities normal, atraumatic, no cyanosis or edema Neurologic: Grossly normal  Follow-up: Dr. Berlin Hun will call with appointment date and time   Disposition:  To home.   Junious Silk, ANP pager [phone]  I have examined the patient and reviewed the chart. I agree with the above d/c summary.   Calvert Cantor, MD [phone]

## 2012-03-24 NOTE — Progress Notes (Signed)
Pt was d'cd home after all d'c instructions were given. Pt voiced understanding of all instructions.

## 2012-03-24 NOTE — Discharge Instructions (Signed)
Asthma Prevention  Cigarette smoke, house dust, molds, pollens, animal dander, certain insects, exercise, and even cold air are all triggers that can cause an asthma attack. Often, no specific triggers are identified.   Take the following measures around your house to reduce attacks:   Avoid cigarette and other smoke. No smoking should be allowed in a home where someone with asthma lives. If smoking is allowed indoors, it should be done in a room with a closed door, and a window should be opened to clear the air. If possible, do not use a wood-burning stove, kerosene heater, or fireplace. Minimize exposure to all sources of smoke, including incense, candles, fires, and fireworks.   Decrease pollen exposure. Keep your windows shut and use central air during the pollen allergy season. Stay indoors with windows closed from late morning to afternoon, if you can. Avoid mowing the lawn if you have grass pollen allergy. Change your clothes and shower after being outside during this time of year.   Remove molds from bathrooms and wet areas. Do this by cleaning the floors with a fungicide or diluted bleach. Avoid using humidifiers, vaporizers, or swamp coolers. These can spread molds through the air. Fix leaky faucets, pipes, or other sources of water that have mold around them.   Decrease house dust exposure. Do this by using bare floors, vacuuming frequently, and changing furnace and air cooler filters frequently. Avoid using feather, wool, or foam bedding. Use polyester pillows and plastic covers over your mattress. Wash bedding weekly in hot water (hotter than 130 F).   Try to get someone else to vacuum for you once or twice a week, if you can. Stay out of rooms while they are being vacuumed and for a short while afterward. If you vacuum, use a dust mask (from a hardware store), a double-layered or microfilter vacuum cleaner bag, or a vacuum cleaner with a HEPA filter.   Avoid perfumes, talcum powder, hair spray,  paints and other strong odors and fumes.   Keep warm-blooded pets (cats, dogs, rodents, birds) outside the home if they are triggers for asthma. If you can't keep the pet outdoors, keep the pet out of your bedroom and other sleeping areas at all times, and keep the door closed. Remove carpets and furniture covered with cloth from your home. If that is not possible, keep the pet away from fabric-covered furniture and carpets.   Eliminate cockroaches. Keep food and garbage in closed containers. Never leave food out. Use poison baits, traps, powders, gels, or paste (for example, boric acid). If a spray is used to kill cockroaches, stay out of the room until the odor goes away.   Decrease indoor humidity to less than 60%. Use an indoor air cleaning device.   Avoid sulfites in foods and beverages. Do not drink beer or wine or eat dried fruit, processed potatoes, or shrimp if they cause asthma symptoms.   Avoid cold air. Cover your nose and mouth with a scarf on cold or windy days.   Avoid aspirin. This is the most common drug causing serious asthma attacks.   If exercise triggers your asthma, ask your caregiver how you should prepare before exercising. (For example, ask if you could use your inhaler 10 minutes before exercising.)   Avoid close contact with people who have a cold or the flu since your asthma symptoms may get worse if you catch the infection from them. Wash your hands thoroughly after touching items that may have been handled by   respiratory infection.   Get a flu shot every year to protect against the flu virus, which often makes asthma worse for days to weeks. Also get a pneumonia shot once every five to 10 years.  Call your caregiver if you want further information about measures you can take to help prevent asthma attacks. Document Released: 11/15/2005 Document Revised: 11/04/2011 Document Reviewed: 09/23/2009 [facility] Patient Information 2012 [place],  [place].Asthma FAQ Asthma is a serious condition that causes breathing problems. Some severe attacks can cause death. Wear a bracelet or chain that lets others know you have asthma. HOW DID I GET ASTHMA? You might have been born with asthma, or you might be allergic to something that causes an asthma attack. WHAT CAUSES AN ASTHMA ATTACK? There are many things that can cause an asthma attack. Some of the most common causes are:  Grass, weed, or tree pollen in the air.   Air pollution.   Dust.   Heavy or hard exercise.   Emotional upset.   Infections.   Smoking and secondhand smoke.   Some medicines like aspirin.   Allergies to:   Animals such as cats, dogs, or rabbits.   Certain foods like wheat, rye, nuts, or shellfish.  HOW DO I KEEP FROM HAVING AN ATTACK?  Refill your medicines before they run out.   Always take your medicine like the doctor tells you. This will help prevent an asthma attack.   If you use an inhaler or diskus, carry it with you at all times.   Stay indoors as much as possible on ozone alert days. This is when the weather is very cold and when the pollen count is high.   Talk to your nurse or doctor about relaxation techniques that might help.   Stop smoking and stay away from smoke.  WHAT HAPPENS DURING AN ASTHMA ATTACK? The airways in your lungs get smaller and puff up (swell). You:  Have a hard time breathing.   Wheeze.   Cough and produce a lot of mucus.  HOW DO I STOP AN ATTACK?  Take medication as directed by your doctor.   If your medicine is not helping, call your local emergency medical services, and go to the emergency room.  Document Released: 08/24/2008 Document Revised: 11/04/2011 Document Reviewed: 08/24/2008 [facility] Patient Information 2012 [place], [place].Adult Asthma With Action Plan Asthma is a condition that affects your lungs. It is characterized by swelling and narrowing of your airways as well as increased mucous production.  The narrowing comes from swelling and muscle spasms inside the air tubes. When this happens, breathing can be difficult and you can have coughing, wheezing and shortness of breath. Knowing more about asthma can help you manage it better. Asthma cannot be cured, but medications and lifestyle changes can help control it. Asthma can be a minor problem for some people but if it is not controlled it can lead to a life-threatening asthma attack. Asthma can change over time. It is important to work with your doctor to manage your asthma symptoms.  CAUSES  The exact cause of asthma is unknown. Asthma is believed to be caused by inherited (genetic) and environmental exposures. Swelling and redness (inflammation) of the airways occurs in asthma. This can be triggered by allergies, viral lung infections or irritants in the air. Allergic reactions can cause you to wheeze immediately or several hours later to an exposure. Asthma triggers are different for each person. It is important to pay attention and know what triggers your asthma.  Common triggers for asthma attacks include:  Allergies (animals, dander, pollen, food, dust mites and molds) can trigger attacks.   Respiratory infection (usually viral, such as the common cold) commonly triggers attacks. Medicines that kill germs (antibiotics) are not helpful for viral infections. They usually do not help with asthmatic attacks.   Exercise can trigger an attack in some people with asthma. Proper pre-exercise medications allow most people to participate in sports.   Irritants (pollution, cigarette smoke, strong odors, aerosol sprays, paint fumes, etc.) all may trigger an asthmatic attack. SMOKING CANNOT BE ALLOWED IN HOMES OF PEOPLE WITH ASTHMA. People with asthma should not smoke and should not be around smokers.   Weather changes. There is not one best climate for people with asthma. Winds increase molds and pollens in the air. Rain refreshes the air by washing  irritants out. Cold air may cause inflammation.   Stress and emotional upset. Emotional problems do not cause asthma but can trigger an attack. Anxiety, frustration and anger may produce attacks. These emotions may also be produced by attacks.   Medications. Some medications may trigger an attack. Certain heart medications and anti-inflammatory medications can cause an attack.   Gastroesophageal reflux disease (GERD). GERD is a condition where stomach acid backs up into your throat (esophagus) and can trigger an asthma attack.  SYMPTOMS   Feeling short of breath.   Chest tightness or pain.   Difficulty sleeping due to coughing, wheezing, or feeling short of breath.   A whistling or wheezing sound with exhalation.   Coughing or wheezing that is worse when you.   Have a virus (such as a cold or the flu).   Are suffering from allergies.   Are exposed to certain fumes or chemicals.   Exercise.  Signs that your asthma is probably getting worse include:   More frequent and bothersome asthma signs and symptoms.   Increasing difficulty breathing (this can be measured by a peak flow meter, which is a simple device used to check how well your lungs are working).   An increasingly frequent need to use a quick-relief inhaler.  HOME CARE INSTRUCTIONS   Remain very calm during an asthma attack. If you or someone else with asthma seems to be getting worse and is unresponsive to treatment, seek immediate medical care.   Control your home environment in the following ways:   Change your heating/air conditioning filter at least once a month.   Place a filter or cheesecloth over your heating/air conditioning vents.   Limit the use of fire places and wood stoves.   Do not smoke. Do not stay in places where others are smoking.   Get rid of pests (roaches, mice) and their droppings.   If you see mold on a plant, throw it away.   Clean your floors and dust every week. Use unscented  cleaning products. Use a vacuum cleaner with a HEPA filter if possible. If vacuuming or cleaning triggers your asthma, try to find someone else to do these chores.   Floors in your house should be wood, tile or vinyl. Carpet can trap dander and dust.   Use allergy-proof pillows, mattress covers, and box spring covers.   Wash bed sheets and blankets every week in hot water and dry in a dryer.   Use a blanket that is made of polyester or cotton with a tight nap.   Do not use a dust ruffle on your bed.   Clean bathrooms and kitchens with bleach and  repaint with mold-resistant paint.   Wash hands frequently.   Talk to your caregiver about an action plan for managing your asthma attacks at home. This includes the use of a peak flow meter that measures the severity of the attack and medications that can help stop the attack. An action plan can help minimize or stop the attack without having to seek medical care.   Always have a plan prepared for seeking medical attention. This should include calling your physician, access to local emergency care, and calling your local emergency services (911 in [place] in case of a severe attack.  SEEK MEDICAL CARE IF:   There is wheezing and shortness of breath even if medications are given to prevent attacks.   An oral temperature above 101 F (38.3 C) develops.   There are muscle aches, chest pain, or thickening of saliva with mucus (sputum).   The sputum changes from clear or white to yellow, green, gray, or bloody.   There are problems related to the medicine you are taking (such as a rash, itching, swelling, or trouble breathing).  SEEK IMMEDIATE MEDICAL CARE IF:   You experience shortness of breath at rest or when engaging in very little physical activity.   Using your usual quick-relief inhaler does not stop your wheezing or there is increased coughing.   You have a rapid pulse, difficulty breathing, or can not complete a short sentence.   You  have difficulty eating, drinking, or talking due to asthma symptoms.   You develop severe chest pain.   There is a bluish color to the lips or fingernails.   You feel lightheaded, dizzy, or faint.  ASTHMA ACTION PLAN, ADULT Patient Name: __________________________________________________ Date: ________ Follow-up appointment with physician:   Physician Name: ____________________   Telephone: ____________________   Follow-up recommendation: ____________________  Always bring all of your medications to all of your appointments. POSSIBLE TRIGGERS Tobacco smoke, dust mites, molds, pets, cockroaches, strong odors and sprays (burning wood in fireplace, incense, scented candles, perfume, paints, cleaning products), exercise, pollen, cold air, or the flu. WHEN WELL: ASTHMA UNDER CONTROL Symptoms: No cough or wheezing, chest tightness or shortness of breath either during the day or at night; can participate in usual activities. If using a peak flow meter: My optimal peak flow is: _____ to _____ (should be 80-100% of personal best) Medicine(s): Every day:  Controller: ________________ How much? ________________ When? ________________   Controller: ________________ How much? ________________ When? ________________  Before exercise:  Reliever: ________________ How much? ________________ When? ________________  If symptoms are noted:  Reliever/Rescue: ________________ How much? ________________ When? ________________  Call your physician if using reliever more than 2-3 times per week. WHEN NOT WELL: ASTHMA GETTING WORSE Symptoms: Cough, wheeze, shortness of breath, chest tightness, waking at night due to asthma, unable to participate in all of usual activities. If using a peak flow meter: My peak flow is: _____ to _____ (50-79% of personal best) Add the following medicine to those used daily:  Reliever/Rescue: ________________ How much? ________________ When? ________________  If  symptoms and peak flow return to GREEN ZONE after 1 hour of above treatment, continue monitoring to make sure you remain in green zone. If symptoms and peak flow DO NOT return to GREEN ZONE after 1 hour of above treatment:  Reliever/Rescue: ________________ How much? ________________ When? ________________   Oral Steroids: ________________ How much? ________________ When? ________________   Call your doctor if: ________________________________________________________________  IF SYMPTOMS GET WORSE: ASTHMA IS SEVERE - GET  HELP NOW! Symptoms: Severely short of breath, rescue meds have not helped, cannot participate in usual activities, you are having trouble walking or talking due to asthma symptoms, you are dizzy or faint, your fingernails or lips are bluish, your symptoms are the same or worse after 24 hours in Yellow Caution Zone. If using a peak flow meter: My peak flow is: less than _____ (50% of personal best) Add the following medicine to those used daily:  Reliever/Rescue: ________________ How much? ________________ When? ________________   Oral Steroids: ________________ How much? ________________ When? ________________   CALL YOUR DOCTOR IMMEDIATELY.  Have someone drive you to the hospital right away or call your local emergency services (911 in [place] if you are in the red danger zone after 15 minutes and you have not reached your doctor. Document Released: 09/12/2009 Document Revised: 11/04/2011 Document Reviewed: 08/11/2011 [facility] Patient Information 2012 [place], [place].

## 2012-03-24 NOTE — Progress Notes (Signed)
Inpatient Diabetes Program Recommendations  AACE/ADA: New Consensus Statement on Inpatient Glycemic Control (2009)  Target Ranges:  Prepandial:   less than 140 mg/dL      Peak postprandial:   less than 180 mg/dL (1-2 hours)      Critically ill patients:  140 - 180 mg/dL    Results for NOELI, LAVERY (MRN [id number]) as of 03/24/2012 09:29  Ref. Range 03/23/2012 20:07 03/23/2012 21:34  Glucose-Capillary Latest Range: 70-99 mg/dL 409 (H) 811 (H)    Results for PADEN, KURAS (MRN [id number]) as of 03/24/2012 09:29  Ref. Range 03/24/2012 08:41  Glucose-Capillary Latest Range: 70-99 mg/dL 213 (H)    Inpatient Diabetes Program Recommendations Insulin - Basal: Please consider adding Lantus 20 units QHS (0.2 units/kg) if fasting sugars continue to stay elevated while on IV steroids.  Note: Will follow. Ambrose Finland RN, MSN, CDE Diabetes Coordinator Inpatient Diabetes Program [phone]

## 2012-03-24 NOTE — Progress Notes (Signed)
Utilization Review Completed.Unnamed Hino T4/26/2013   

## 2012-03-24 NOTE — Progress Notes (Signed)
03/24/2012 0515  Pt's HR in 130s BP 168/91. Craige Cotta, NP notified. EKG completed and reports sinus tach. No new orders received. Will continue to monitor.  [place] Trygg Mantz, RN

## 2012-03-24 NOTE — Progress Notes (Signed)
PCCM Progress Note  Subj: markedly improved. Feels she is at baseline. No new complaints. Wishes to go home  Obj:   BP 168/91  Pulse 120  Temp(Src) 98.5 F (36.9 C) (Oral)  Resp 20  Ht 5\' 6"  (1.676 m)  Wt 136.2 kg (300 lb 4.3 oz)  BMI 48.46 kg/m2  SpO2 100%  LMP 03/03/2012  NAD No JVD Chest clear without wheezes Tachy, reg, no murmur Obese soft, NT, NABS No edema   Labs reviewed   IMP:  Status asthmaticus resolved Poorly controlled moderate to severe persistent asthma Obesity GERD  Plan/Rec:  -Discussed with Dr Butler Denmark -OK for discharge to home -Discharge on tapering prednisone -Resume Symbicort - emphasized need for compliance!! -Add Singulair as part of controller regimen - note that hx of nasal polyps and aspirin sensitivity predict favorable response -Complet 5 day course of levofloxacin -Cont PRN albuterol  -Cont Rx for GERD -She already has f/u appointment with Dr Marchelle Gearing scheduled for 4/29 @ 2:15. I encouraged her to keep this appt -Over long term, will need wt loss. I discussed in detail with her. Suggested she read and follow "[facility] Diet"   Please call if we can be of further assistance   Billy Fischer, MD;  PCCM service; Mobile [phone]

## 2012-03-24 NOTE — ED Provider Notes (Signed)
Medical screening examination/treatment/procedure(s) were performed by non-physician practitioner and as supervising physician I was immediately available for consultation/collaboration.  Sunnie Nielsen, MD 03/24/12 0001

## 2012-03-27 ENCOUNTER — Inpatient Hospital Stay: Payer: Medicare Other | Admitting: Internal Medicine

## 2012-04-28 ENCOUNTER — Inpatient Hospital Stay: Payer: Medicare Other | Admitting: Internal Medicine

## 2012-04-30 ENCOUNTER — Encounter (HOSPITAL_COMMUNITY): Payer: Self-pay

## 2012-04-30 ENCOUNTER — Emergency Department (HOSPITAL_COMMUNITY): Payer: Medicare Other

## 2012-04-30 ENCOUNTER — Inpatient Hospital Stay (HOSPITAL_COMMUNITY)
Admission: EM | Admit: 2012-04-30 | Discharge: 2012-05-03 | DRG: 202 | Disposition: A | Discharge status: Home / Self Care | Payer: Medicare Other | Attending: Internal Medicine | Admitting: Internal Medicine

## 2012-04-30 DIAGNOSIS — K219 Gastro-esophageal reflux disease without esophagitis: Secondary | ICD-10-CM | POA: Diagnosis present

## 2012-04-30 DIAGNOSIS — D509 Iron deficiency anemia, unspecified: Secondary | ICD-10-CM | POA: Diagnosis present

## 2012-04-30 DIAGNOSIS — E119 Type 2 diabetes mellitus without complications: Secondary | ICD-10-CM

## 2012-04-30 DIAGNOSIS — F329 Major depressive disorder, single episode, unspecified: Secondary | ICD-10-CM | POA: Diagnosis present

## 2012-04-30 DIAGNOSIS — E876 Hypokalemia: Secondary | ICD-10-CM

## 2012-04-30 DIAGNOSIS — I10 Essential (primary) hypertension: Secondary | ICD-10-CM | POA: Diagnosis present

## 2012-04-30 DIAGNOSIS — F3289 Other specified depressive episodes: Secondary | ICD-10-CM | POA: Diagnosis present

## 2012-04-30 DIAGNOSIS — Z6841 Body Mass Index (BMI) 40.0 and over, adult: Secondary | ICD-10-CM

## 2012-04-30 DIAGNOSIS — I498 Other specified cardiac arrhythmias: Secondary | ICD-10-CM | POA: Diagnosis present

## 2012-04-30 DIAGNOSIS — F411 Generalized anxiety disorder: Secondary | ICD-10-CM | POA: Diagnosis present

## 2012-04-30 DIAGNOSIS — J45901 Unspecified asthma with (acute) exacerbation: Principal | ICD-10-CM | POA: Diagnosis present

## 2012-04-30 DIAGNOSIS — R000 Tachycardia, unspecified: Secondary | ICD-10-CM

## 2012-04-30 DIAGNOSIS — D649 Anemia, unspecified: Secondary | ICD-10-CM | POA: Diagnosis present

## 2012-04-30 LAB — CBC
HCT: 33.1 % — ABNORMAL LOW (ref 36.0–46.0)
Hemoglobin: 9.6 g/dL — ABNORMAL LOW (ref 12.0–15.0)
MCV: 66.9 fL — ABNORMAL LOW (ref 78.0–100.0)
RDW: 18 % — ABNORMAL HIGH (ref 11.5–15.5)
WBC: 9.2 10*3/uL (ref 4.0–10.5)

## 2012-04-30 LAB — BASIC METABOLIC PANEL
BUN: 6 mg/dL (ref 6–23)
CO2: 24 mEq/L (ref 19–32)
Chloride: 102 mEq/L (ref 96–112)
Creatinine, Ser: 0.75 mg/dL (ref 0.50–1.10)
GFR calc Af Amer: >90 mL/min (ref >90)
Glucose, Bld: 204 mg/dL — ABNORMAL HIGH (ref 70–99)

## 2012-04-30 LAB — DIFFERENTIAL
Basophils Absolute: 0 10*3/uL (ref 0.0–0.1)
Basophils Relative: 0 % (ref 0–1)
Lymphs Abs: 1.7 10*3/uL (ref 0.7–4.0)
Monocytes Relative: 9 % (ref 3–12)
Neutro Abs: 5.7 10*3/uL (ref 1.7–7.7)

## 2012-04-30 MED ORDER — MAGNESIUM SULFATE 40 MG/ML IJ SOLN
2.0000 g | INTRAMUSCULAR | Status: AC
  Administered 2012-05-01: 2 g via INTRAVENOUS
  Filled 2012-04-30: qty 50

## 2012-04-30 MED ORDER — PREDNISONE 20 MG PO TABS
60.0000 mg | ORAL_TABLET | Freq: Every day | ORAL | Status: DC
  Filled 2012-04-30: qty 3

## 2012-04-30 MED ORDER — ALBUTEROL (5 MG/ML) CONTINUOUS INHALATION SOLN: 10.0000 mg/h | INHALATION_SOLUTION | Freq: Once | RESPIRATORY_TRACT | Status: DC

## 2012-04-30 MED ORDER — LORATADINE 10 MG PO TABS
10.0000 mg | ORAL_TABLET | Freq: Every day | ORAL | Status: DC
  Administered 2012-05-01 – 2012-05-03 (×3): 10 mg via ORAL
  Filled 2012-04-30 (×3): qty 1

## 2012-04-30 MED ORDER — GUAIFENESIN ER 600 MG PO TB12
600.0000 mg | ORAL_TABLET | Freq: Two times a day (BID) | ORAL | Status: DC
  Administered 2012-04-30 – 2012-05-03 (×6): 600 mg via ORAL
  Filled 2012-04-30 (×7): qty 1

## 2012-04-30 MED ORDER — ALBUTEROL (5 MG/ML) CONTINUOUS INHALATION SOLN
20.0000 mg/h | INHALATION_SOLUTION | Freq: Once | RESPIRATORY_TRACT | Status: AC
  Administered 2012-04-30: 20 mg/h via RESPIRATORY_TRACT

## 2012-04-30 MED ORDER — FERROUS FUMARATE 325 (106 FE) MG PO TABS
1.0000 | ORAL_TABLET | Freq: Every day | ORAL | Status: DC
  Administered 2012-05-01 – 2012-05-03 (×3): 106 mg via ORAL
  Filled 2012-04-30 (×4): qty 1

## 2012-04-30 MED ORDER — FLUTICASONE PROPIONATE 50 MCG/ACT NA SUSP
2.0000 | Freq: Every day | NASAL | Status: DC
  Administered 2012-05-01 – 2012-05-03 (×3): 2 via NASAL
  Filled 2012-04-30: qty 16

## 2012-04-30 MED ORDER — PANTOPRAZOLE SODIUM 40 MG PO TBEC
40.0000 mg | DELAYED_RELEASE_TABLET | Freq: Two times a day (BID) | ORAL | Status: DC
  Administered 2012-05-01 – 2012-05-03 (×5): 40 mg via ORAL
  Filled 2012-04-30 (×8): qty 1

## 2012-04-30 MED ORDER — BUDESONIDE-FORMOTEROL FUMARATE 160-4.5 MCG/ACT IN AERO
2.0000 | INHALATION_SPRAY | Freq: Two times a day (BID) | RESPIRATORY_TRACT | Status: DC
  Administered 2012-05-01 – 2012-05-03 (×5): 2 via RESPIRATORY_TRACT
  Filled 2012-04-30: qty 6

## 2012-04-30 MED ORDER — HEPARIN SODIUM (PORCINE) 5000 UNIT/ML IJ SOLN
5000.0000 [IU] | Freq: Three times a day (TID) | INTRAMUSCULAR | Status: DC
  Administered 2012-04-30 – 2012-05-03 (×8): 5000 [IU] via SUBCUTANEOUS
  Filled 2012-04-30 (×11): qty 1

## 2012-04-30 MED ORDER — SODIUM CHLORIDE 0.9 % IV SOLN
Freq: Once | INTRAVENOUS | Status: AC
  Administered 2012-04-30: via INTRAVENOUS

## 2012-04-30 MED ORDER — POTASSIUM CHLORIDE 10 MEQ/100ML IV SOLN
10.0000 meq | INTRAVENOUS | Status: AC
  Administered 2012-05-01 (×4): 10 meq via INTRAVENOUS
  Filled 2012-04-30 (×4): qty 100

## 2012-04-30 MED ORDER — INSULIN ASPART 100 UNIT/ML ~~LOC~~ SOLN
0.0000 [IU] | Freq: Three times a day (TID) | SUBCUTANEOUS | Status: DC
  Administered 2012-05-01 – 2012-05-02 (×5): 5 [IU] via SUBCUTANEOUS
  Administered 2012-05-02: 3 [IU] via SUBCUTANEOUS
  Administered 2012-05-03 (×2): 5 [IU] via SUBCUTANEOUS

## 2012-04-30 MED ORDER — ALUM & MAG HYDROXIDE-SIMETH 200-200-20 MG/5ML PO SUSP
30.0000 mL | Freq: Four times a day (QID) | ORAL | Status: DC | PRN
  Administered 2012-05-01 – 2012-05-02 (×2): 30 mL via ORAL
  Filled 2012-04-30 (×2): qty 30

## 2012-04-30 MED ORDER — METHYLPREDNISOLONE SODIUM SUCC 125 MG IJ SOLR
60.0000 mg | Freq: Four times a day (QID) | INTRAMUSCULAR | Status: DC
  Administered 2012-04-30 – 2012-05-02 (×6): 60 mg via INTRAVENOUS
  Filled 2012-04-30 (×3): qty 0.96
  Filled 2012-04-30: qty 2
  Filled 2012-04-30 (×5): qty 0.96
  Filled 2012-04-30: qty 2

## 2012-04-30 MED ORDER — POTASSIUM CHLORIDE CRYS ER 20 MEQ PO TBCR
60.0000 meq | EXTENDED_RELEASE_TABLET | Freq: Once | ORAL | Status: AC
  Administered 2012-04-30: 60 meq via ORAL
  Filled 2012-04-30: qty 3

## 2012-04-30 MED ORDER — ACETAMINOPHEN 325 MG PO TABS: 650.0000 mg | ORAL_TABLET | Freq: Four times a day (QID) | ORAL | Status: DC | PRN

## 2012-04-30 MED ORDER — ADULT MULTIVITAMIN W/MINERALS CH
1.0000 | ORAL_TABLET | Freq: Every day | ORAL | Status: DC
  Administered 2012-05-01 – 2012-05-03 (×3): 1 via ORAL
  Filled 2012-04-30 (×3): qty 1

## 2012-04-30 MED ORDER — ALBUTEROL SULFATE (5 MG/ML) 0.5% IN NEBU
2.5000 mg | INHALATION_SOLUTION | RESPIRATORY_TRACT | Status: DC | PRN
  Administered 2012-05-01: 2.5 mg via RESPIRATORY_TRACT

## 2012-04-30 MED ORDER — SODIUM CHLORIDE 0.9 % IJ SOLN
3.0000 mL | Freq: Two times a day (BID) | INTRAMUSCULAR | Status: DC
  Administered 2012-04-30 – 2012-05-03 (×4): 3 mL via INTRAVENOUS

## 2012-04-30 MED ORDER — PREDNISONE 50 MG PO TABS
60.0000 mg | ORAL_TABLET | Freq: Every day | ORAL | Status: DC
  Administered 2012-04-30: 60 mg via ORAL

## 2012-04-30 MED ORDER — ONDANSETRON HCL 4 MG PO TABS: 4.0000 mg | ORAL_TABLET | Freq: Four times a day (QID) | ORAL | Status: DC | PRN

## 2012-04-30 MED ORDER — ONDANSETRON HCL 4 MG/2ML IJ SOLN: 4.0000 mg | Freq: Four times a day (QID) | INTRAMUSCULAR | Status: DC | PRN

## 2012-04-30 MED ORDER — POTASSIUM CHLORIDE IN NACL 20-0.9 MEQ/L-% IV SOLN
INTRAVENOUS | Status: DC
  Administered 2012-05-01: via INTRAVENOUS
  Filled 2012-04-30: qty 1000

## 2012-04-30 MED ORDER — ACETAMINOPHEN 650 MG RE SUPP: 650.0000 mg | Freq: Four times a day (QID) | RECTAL | Status: DC | PRN

## 2012-04-30 MED ORDER — FOLIC ACID 1 MG PO TABS
1000.0000 ug | ORAL_TABLET | Freq: Every day | ORAL | Status: DC
  Administered 2012-05-01 – 2012-05-03 (×3): 1 mg via ORAL
  Filled 2012-04-30 (×3): qty 1

## 2012-04-30 MED ORDER — LEVOFLOXACIN IN D5W 500 MG/100ML IV SOLN
500.0000 mg | INTRAVENOUS | Status: DC
  Administered 2012-05-01 (×2): 500 mg via INTRAVENOUS
  Filled 2012-04-30 (×4): qty 100

## 2012-04-30 MED ORDER — ALBUTEROL SULFATE (5 MG/ML) 0.5% IN NEBU
2.5000 mg | INHALATION_SOLUTION | RESPIRATORY_TRACT | Status: DC
  Administered 2012-04-30 – 2012-05-01 (×2): 2.5 mg via RESPIRATORY_TRACT
  Filled 2012-04-30 (×3): qty 0.5

## 2012-04-30 NOTE — ED Notes (Signed)
Pt 02 sats decreased to 89% RA HR increased to 135  RR 34.  Pt placed on NRB cardiac monitor oxygen and pulse ox intact

## 2012-04-30 NOTE — H&P (Signed)
[facility]                                                                                     Patient Demographics  [name] [name], is a 36 y.o. female  CSN: [id number]  MRN: [id number]  DOB - 1975-12-23  Admit Date - 04/30/2012  Outpatient Primary MD for the patient is No Pcp   With History of -  Past Medical History  Diagnosis Date  . Asthma   . Hypertension   . Chronic anemia   . Abnormal TSH   . Morbid obesity   . GERD (gastroesophageal reflux disease)   . Seasonal allergies   . Nasal polyps   . Diabetes mellitus     type 2  . Chronic bronchitis   . Sleep apnea   . Shortness of breath 12/14/11    "related to how bad my asthma is; sometimes @ rest, lying down, w/exertion"  . Pneumonia   . Blood transfusion   . Inguinal hernia unilateral, non-recurrent     right  . Kidney infection 2008  . Headache   . Anxiety   . Depression   . PTSD (post-traumatic stress disorder)     "related to prior hospitalizations; things that happened when I was in hospital"      Past Surgical History  Procedure Date  . Cervical cerclage 1999; 2002  . Tubal ligation 2002    in for   Chief Complaint  Patient presents with  . Shortness of Breath  . Respiratory Distress  . Asthma  . Emesis     HPI  [name] [name]  is a 36 y.o. female,  With significant past medical history as mentioned above, and it was along the ED with complaints of shortness of breath cough wheezing productive sputum yellow in color, patient has multiple admissions secondary to COPD exacerbation and history of intubation as well, where she has a history of 2 intubation secondary to status asthmaticus, it was tachycardic upon presentation audible wheezing received 3 sets of albuterol nebulizer at home 2 in ambulance and 1 hour albuterol inhalation treatment in ED, patient reports she has been taking Symbicort regularly, as well patient was found to have potassium of 2.7,  she is not to have history of recurrent hypokalemia, she is on hydrochlorothiazide at home and she isn't not on regular potassium supplement,  Review of Systems    In addition to HPI above,  No Fever-chills, No Headache, No changes with Vision or hearing, No problems swallowing food or Liquids, No Chest pain, complaints of Cough and Shortness of Breath with productive yellow sputum. No Abdominal pain, No Nausea or Vommitting, Bowel movements are regular, No Blood in stool or Urine, No dysuria, No new skin rashes or bruises, No new joints pains-aches,  No new weakness, tingling, numbness in any extremity, No recent weight gain or loss, No polyuria, polydypsia or polyphagia, No significant Mental Stressors.  A full 10 point Review of Systems was done, except as stated above, all other Review of Systems were negative.   Social History History  Substance Use Topics  . Smoking status: Never Smoker   . Smokeless tobacco: Never  Used  . Alcohol Use: 0.6 oz/week    1 Glasses of wine per week     12/14/11 "only on birthdays; parties, and such"     Family History Family History  Problem Relation Age of Onset  . Diabetes Mother   . Heart disease Father     Prior to Admission medications   Medication Sig Start Date End Date Taking? Authorizing Provider  budesonide-formoterol (SYMBICORT) 160-4.5 MCG/ACT inhaler Inhale 2 puffs into the lungs 2 (two) times daily. 03/24/12 03/24/13 Yes Russella Dar, NP  diphenhydrAMINE (BENADRYL) 50 MG tablet Take 50 mg by mouth at bedtime as needed. For allergies    Yes Historical Provider, MD  ferrous fumarate (HEMOCYTE - 106 MG FE) 325 (106 FE) MG TABS Take 1 tablet by mouth daily.   Yes Historical Provider, MD  fluticasone (FLONASE) 50 MCG/ACT nasal spray Place 2 sprays into the nose daily.     Yes Historical Provider, MD  folic acid (FOLVITE) 400 MCG tablet Take 400 mcg by mouth daily.     Yes Historical Provider, MD    irbesartan-hydrochlorothiazide (AVALIDE) 300-25 MG per tablet Take 1 tablet by mouth daily.     Yes Historical Provider, MD  loratadine (CLARITIN) 10 MG tablet Take 10 mg by mouth daily.     Yes Historical Provider, MD  metFORMIN (GLUCOPHAGE) 1000 MG tablet Take 1,000 mg by mouth 2 (two) times daily with a meal. Takes only when taking Prednisone taper to avoid elevation of blood sugar levels   Yes Historical Provider, MD  Multiple Vitamin (MULTIVITAMIN) capsule Take 1 capsule by mouth daily.     Yes Historical Provider, MD  pantoprazole (PROTONIX) 40 MG tablet Take 40 mg by mouth daily.    Yes Historical Provider, MD    Allergies  Allergen Reactions  . Aspirin Anaphylaxis  . Ibuprofen Anaphylaxis  . Shellfish Allergy Hives and Swelling    Facial and oral swelling  . Menthol Swelling    Tongue swelling  . Other Other (See Comments)    Plastic thermometer covers cause sloughing off of skin on tongue and inside mouth    Physical Exam  Vitals  Blood pressure 134/66, pulse 117, resp. rate 22, weight 135.172 kg (298 lb), last menstrual period 04/02/2012, SpO2 97.00%.   1. General morbidly obese female lying in bed in moderate respiratory distress    2. Normal affect and insight, Not Suicidal or Homicidal, Awake Alert, Oriented *3.  3. No F.N deficits, ALL C.Nerves Intact, Strength 5/5 all 4 extremities, Sensation intact all 4 extremities, Plantars down going.  4. Ears and Eyes appear Normal, Conjunctivae clear, PERRLA. Moist Oral Mucosa.  5. Supple Neck, No JVD, No cervical lymphadenopathy appriciated, No Carotid Bruits.  6. Symmetrical Chest wall movement, audible wheezing, speaks first sentences, decreased air entry  7. tachycardic, No Gallops, Rubs or Murmurs, No Parasternal Heave.  8. Positive Bowel Sounds, Abdomen Soft, Non tender, No organomegaly appriciated,       No rebound -guarding or rigidity.  9.  No Cyanosis, Normal Skin Turgor, No Skin Rash or Bruise.  10. Good  muscle tone,  joints appear normal , no effusions, Normal ROM.  11. No Palpable Lymph Nodes in Neck or Axillae    Data Review  CBC  Lab 04/30/12 2120  WBC 9.2  HGB 9.6*  HCT 33.1*  PLT 344  MCV 66.9*  MCH 19.4*  MCHC 29.0*  RDW 18.0*  LYMPHSABS 1.7  MONOABS 0.8  EOSABS 1.0*  BASOSABS 0.0  BANDABS --   ------------------------------------------------------------------------------------------------------------------  Chemistries   Lab 04/30/12 2120  NA 138  K 2.7*  CL 102  CO2 24  GLUCOSE 204*  BUN 6  CREATININE 0.75  CALCIUM 8.6  MG --  AST --  ALT --  ALKPHOS --  BILITOT --   ---------------------------------------------------------------------------------------------------------------  Urinalysis    Component Value Date/Time   COLORURINE YELLOW 12/14/2011 2006   APPEARANCEUR CLEAR 12/14/2011 2006   LABSPEC 1.035* 12/14/2011 2006   PHURINE 5.0 12/14/2011 2006   GLUCOSEU >1000* 12/14/2011 2006   HGBUR LARGE* 12/14/2011 2006   BILIRUBINUR NEGATIVE 12/14/2011 2006   KETONESUR 15* 12/14/2011 2006   PROTEINUR NEGATIVE 12/14/2011 2006   UROBILINOGEN 0.2 12/14/2011 2006   NITRITE NEGATIVE 12/14/2011 2006   LEUKOCYTESUR NEGATIVE 12/14/2011 2006    ----------------------------------------------------------------------------------------------------------------     Imaging results:   Dg Chest Port 1 View  04/30/2012  *RADIOLOGY REPORT*  Clinical Data: Shortness of breath.  PORTABLE CHEST - 1 VIEW  Comparison: Chest 03/23/2012.  Findings: Lungs clear.  Heart size upper normal.  No pneumothorax or effusion.  IMPRESSION: No acute disease.  Original Report Authenticated By: Bernadene Bell. Maricela Curet, M.D.   Assessment & Plan  Active Problems:  Asthma exacerbation attacks  Hypokalemia  HTN (hypertension), malignant  GERD (gastroesophageal reflux disease)  Chronic anemia  Type 2 diabetes mellitus  Tachycardia    1. asthma exacerbation. We'll admit patient to step  down we'll continue her on oxygen we'll start on IV Solu-Medrol 60 mg every 6 hours, given 2 g of magnesium sulfate, will start her on albuterol every 4 hours standing dose every 2 hours as needed, we'll continue with Symbicort, and we'll start her on Levaquin as she is having cough with productive sputum.  2. Hypokalemia. Patient has recurrent hypokalemia will replace already received 60 of by mouth in ED we'll give 40 mg IV once, and we'll hold her hydrochlorothiazide, and will recommend not to take it as an outpatient.  3.  diabetes mellitus. Will hold metformin will start on insulin sliding scale as she is on Solu-Medrol  4. Anemia. Chronic continue with iron supplement  5. Hypertension. Will discontinue hydrochlorothiazide, will resume irbesartan when she is more stable  6. Tachycardia. This is secondary to asthma exacerbation  7. GERD. Will increase Protonix to 40 mg oral twice a day as she is on large dose steroids and.   DVT prophylaxis heparin   AM Labs Ordered, also please review Full Orders  Admission, patients condition and plan of care including tests being ordered have been discussed with the patient  who indicate understanding and agree with the plan and Code Status.  Code Status Full   Condition GUARDED  Jennifermarie Franzen M.D on 04/30/2012 at 11:26 PM  B

## 2012-04-30 NOTE — ED Notes (Signed)
First attempt at blood draw unsuccessful due to pt coughing so much.  IV team coming - will obtain labs at that point.

## 2012-04-30 NOTE — ED Notes (Signed)
Currently rec breathing tx from EMS- Pt alert and verbalizing

## 2012-04-30 NOTE — ED Notes (Signed)
Lab called with critical potassium level of 2.7. Freida Busman, MD notified.

## 2012-04-30 NOTE — ED Provider Notes (Signed)
History     CSN: [id number]  Arrival date & time 04/30/12  0865   First MD Initiated Contact with Patient 04/30/12 1910      Chief Complaint  Patient presents with  . Shortness of Breath  . Respiratory Distress  . Asthma  . Emesis    (Consider location/radiation/quality/duration/timing/severity/associated sxs/prior treatment) Patient is a 36 y.o. female presenting with shortness of breath, asthma, and vomiting. The history is provided by the patient.  Shortness of Breath  Associated symptoms include shortness of breath. Her past medical history is significant for asthma.  Asthma Associated symptoms include shortness of breath.  Emesis    patient here with shortness of breath similar to her asthma exacerbations. Has used her home nebulizer x3 without relief. Called EMS was given 2 additional nebulizers with some relief. Notes fever and cough x3 days. No vomiting or diarrhea. Has been hospitalized before in the past for asthma  Past Medical History  Diagnosis Date  . Asthma   . Hypertension   . Chronic anemia   . Abnormal TSH   . Morbid obesity   . GERD (gastroesophageal reflux disease)   . Seasonal allergies   . Nasal polyps   . Diabetes mellitus     type 2  . Chronic bronchitis   . Sleep apnea   . Shortness of breath 12/14/11    "related to how bad my asthma is; sometimes @ rest, lying down, w/exertion"  . Pneumonia   . Blood transfusion   . Inguinal hernia unilateral, non-recurrent     right  . Kidney infection 2008  . Headache   . Anxiety   . Depression   . PTSD (post-traumatic stress disorder)     "related to prior hospitalizations; things that happened when I was in hospital"    Past Surgical History  Procedure Date  . Cervical cerclage 1999; 2002  . Tubal ligation 2002    Family History  Problem Relation Age of Onset  . Diabetes Mother   . Heart disease Father     History  Substance Use Topics  . Smoking status: Never Smoker   . Smokeless  tobacco: Never Used  . Alcohol Use: 0.6 oz/week    1 Glasses of wine per week     12/14/11 "only on birthdays; parties, and such"    OB History    Grav Para Term Preterm Abortions TAB SAB Ect Mult Living                  Review of Systems  Respiratory: Positive for shortness of breath.   Gastrointestinal: Positive for vomiting.  All other systems reviewed and are negative.    Allergies  Aspirin; Ibuprofen; Shellfish allergy; Menthol; and Other  Home Medications   Current Outpatient Rx  Name Route Sig Dispense Refill  . ALBUTEROL SULFATE (2.5 MG/3ML) 0.083% IN NEBU Nebulization Take 3 mLs (2.5 mg total) by nebulization every 4 (four) hours as needed for wheezing or shortness of breath. 75 mL 12  . BUDESONIDE-FORMOTEROL FUMARATE 160-4.5 MCG/ACT IN AERO Inhalation Inhale 2 puffs into the lungs 2 (two) times daily. 1 Inhaler 12  . HYDROCOD POLST-CPM POLST ER 10-8 MG/5ML PO LQCR Oral Take 5 mLs by mouth every 12 (twelve) hours. 140 mL 0  . DIPHENHYDRAMINE HCL 50 MG PO TABS Oral Take 50 mg by mouth at bedtime as needed. For allergies     . FERROUS FUMARATE 325 (106 FE) MG PO TABS Oral Take 1 tablet by mouth daily.    Marland Kitchen  FLUTICASONE PROPIONATE 50 MCG/ACT NA SUSP Nasal Place 2 sprays into the nose daily.      Marland Kitchen FOLIC ACID 400 MCG PO TABS Oral Take 400 mcg by mouth daily.      . IRBESARTAN-HYDROCHLOROTHIAZIDE 300-25 MG PO TABS Oral Take 1 tablet by mouth daily.      Marland Kitchen LEVALBUTEROL HCL 0.63 MG/3ML IN NEBU Nebulization Take 3 mLs (0.63 mg total) by nebulization every 4 (four) hours as needed for wheezing. 3 mL 12  . LORATADINE 10 MG PO TABS Oral Take 10 mg by mouth daily.      Marland Kitchen METFORMIN HCL 1000 MG PO TABS Oral Take 1,000 mg by mouth 2 (two) times daily with a meal. Takes only when taking Prednisone taper to avoid elevation of blood sugar levels    . MULTIVITAMINS PO CAPS Oral Take 1 capsule by mouth daily.      Marland Kitchen PANTOPRAZOLE SODIUM 40 MG PO TBEC Oral Take 40 mg by mouth daily.     Marland Kitchen  SALINE NASAL SPRAY NA Nasal Place 1 spray into the nose 3 (three) times daily as needed.      Marland Kitchen TIOTROPIUM BROMIDE MONOHYDRATE 18 MCG IN CAPS Inhalation Place 18 mcg into inhaler and inhale daily.       BP 122/78  Pulse 125  Wt 298 lb (135.172 kg)  SpO2 93%  LMP 04/02/2012  Physical Exam  Nursing note and vitals reviewed. Constitutional: She is oriented to person, place, and time. She appears well-developed and well-nourished.  Non-toxic appearance. No distress.  HENT:  Head: Normocephalic and atraumatic.  Eyes: Conjunctivae, EOM and lids are normal. Pupils are equal, round, and reactive to light.  Neck: Normal range of motion. Neck supple. No tracheal deviation present. No mass present.  Cardiovascular: Normal rate, regular rhythm and normal heart sounds.  Exam reveals no gallop.   No murmur heard. Pulmonary/Chest: Effort normal. No stridor. No respiratory distress. She has decreased breath sounds. She has wheezes. She has no rhonchi. She has no rales.  Abdominal: Soft. Normal appearance and bowel sounds are normal. She exhibits no distension. There is no tenderness. There is no rebound and no CVA tenderness.  Musculoskeletal: Normal range of motion. She exhibits no edema and no tenderness.  Neurological: She is alert and oriented to person, place, and time. She has normal strength. No cranial nerve deficit or sensory deficit. GCS eye subscore is 4. GCS verbal subscore is 5. GCS motor subscore is 6.  Skin: Skin is warm and dry. No abrasion and no rash noted.  Psychiatric: She has a normal mood and affect. Her speech is normal and behavior is normal.    ED Course  Procedures (including critical care time)   Labs Reviewed  CBC  DIFFERENTIAL  BASIC METABOLIC PANEL   No results found.   No diagnosis found.    MDM  Patient given Solu-Medrol and albuterol here. Continues with wheezing and will require hospitalization        Toy Baker, MD 04/30/12 2243

## 2012-04-30 NOTE — ED Notes (Signed)
Per [facility]- Pt presents with shortness of breath- HX of asthma Home neb x 3 taken without relief- unable to obtain IV in route- 10mg  albuterol 1/2 mg atrovent- 4mg  zofran IM- Per [facility]  RR decreases Pt presents verbalizing tired.  RRT paged for assessment

## 2012-05-01 DIAGNOSIS — E876 Hypokalemia: Secondary | ICD-10-CM

## 2012-05-01 DIAGNOSIS — J45901 Unspecified asthma with (acute) exacerbation: Secondary | ICD-10-CM

## 2012-05-01 DIAGNOSIS — E119 Type 2 diabetes mellitus without complications: Secondary | ICD-10-CM

## 2012-05-01 DIAGNOSIS — R000 Tachycardia, unspecified: Secondary | ICD-10-CM

## 2012-05-01 LAB — RETICULOCYTES
RBC.: 4.92 MIL/uL (ref 3.87–5.11)
Retic Count, Absolute: 88.6 10*3/uL (ref 19.0–186.0)
Retic Ct Pct: 1.8 % (ref 0.4–3.1)

## 2012-05-01 LAB — GLUCOSE, CAPILLARY
Glucose-Capillary: 264 mg/dL — ABNORMAL HIGH (ref 70–99)
Glucose-Capillary: 278 mg/dL — ABNORMAL HIGH (ref 70–99)
Glucose-Capillary: 273 mg/dL — ABNORMAL HIGH (ref 70–99)
Glucose-Capillary: 244 mg/dL — ABNORMAL HIGH (ref 70–99)

## 2012-05-01 LAB — IRON AND TIBC
Iron: 13 ug/dL — ABNORMAL LOW (ref 42–135)
Saturation Ratios: 3 % — ABNORMAL LOW (ref 20–55)
TIBC: 444 ug/dL (ref 250–470)
UIBC: 431 ug/dL — ABNORMAL HIGH (ref 125–400)

## 2012-05-01 LAB — CBC
HCT: 33.2 % — ABNORMAL LOW (ref 36.0–46.0)
Hemoglobin: 9.5 g/dL — ABNORMAL LOW (ref 12.0–15.0)
RBC: 4.9 MIL/uL (ref 3.87–5.11)
RDW: 18.2 % — ABNORMAL HIGH (ref 11.5–15.5)
WBC: 8.5 10*3/uL (ref 4.0–10.5)

## 2012-05-01 LAB — BASIC METABOLIC PANEL
Chloride: 100 mEq/L (ref 96–112)
Creatinine, Ser: 0.67 mg/dL (ref 0.50–1.10)
GFR calc Af Amer: >90 mL/min (ref >90)
Potassium: 3.6 mEq/L (ref 3.5–5.1)
Sodium: 135 mEq/L (ref 135–145)

## 2012-05-01 LAB — FERRITIN: Ferritin: 4 ng/mL — ABNORMAL LOW (ref 10–291)

## 2012-05-01 LAB — POTASSIUM: Potassium: 3.6 meq/L (ref 3.5–5.1)

## 2012-05-01 LAB — MRSA PCR SCREENING: MRSA by PCR: POSITIVE — AB

## 2012-05-01 MED ORDER — MUPIROCIN 2 % EX OINT
1.0000 "application " | TOPICAL_OINTMENT | Freq: Two times a day (BID) | CUTANEOUS | Status: DC
  Administered 2012-05-02 – 2012-05-03 (×5): 1 via NASAL
  Filled 2012-05-01: qty 22

## 2012-05-01 MED ORDER — SODIUM CHLORIDE 0.9 % IV SOLN
INTRAVENOUS | Status: DC
  Administered 2012-05-01 – 2012-05-03 (×3): via INTRAVENOUS

## 2012-05-01 MED ORDER — TIOTROPIUM BROMIDE MONOHYDRATE 18 MCG IN CAPS
18.0000 ug | ORAL_CAPSULE | Freq: Every day | RESPIRATORY_TRACT | Status: DC
  Administered 2012-05-01 – 2012-05-03 (×3): 18 ug via RESPIRATORY_TRACT
  Filled 2012-05-01: qty 5

## 2012-05-01 MED ORDER — POTASSIUM CHLORIDE CRYS ER 20 MEQ PO TBCR: 40.0000 meq | EXTENDED_RELEASE_TABLET | Freq: Every day | ORAL | Status: DC

## 2012-05-01 MED ORDER — ALBUTEROL SULFATE (5 MG/ML) 0.5% IN NEBU
2.5000 mg | INHALATION_SOLUTION | RESPIRATORY_TRACT | Status: DC | PRN
  Administered 2012-05-01 (×4): 2.5 mg via RESPIRATORY_TRACT
  Filled 2012-05-01 (×3): qty 0.5

## 2012-05-01 MED ORDER — ALBUTEROL SULFATE (5 MG/ML) 0.5% IN NEBU
2.5000 mg | INHALATION_SOLUTION | RESPIRATORY_TRACT | Status: DC
  Administered 2012-05-01 – 2012-05-02 (×6): 2.5 mg via RESPIRATORY_TRACT
  Filled 2012-05-01 (×5): qty 0.5

## 2012-05-01 MED ORDER — CHLORHEXIDINE GLUCONATE CLOTH 2 % EX PADS
6.0000 | MEDICATED_PAD | Freq: Every day | CUTANEOUS | Status: DC
  Administered 2012-05-02 – 2012-05-03 (×2): 6 via TOPICAL

## 2012-05-01 NOTE — Progress Notes (Addendum)
Subjective: Patient mentions that she feels better today.  She states that she has seasonal allergies to certain trees, pollen, cats, cigarette smoke, and certain colognes and perfumes.  Tried to move to [place] for symptom improvement but still had trouble with her asthma and found it harder to live there due to cost of living.  Thus she moved back to  and subsequently developed her current symptoms.  She reports that she has been intubated twice before secondary to asthma exacerbations.  Addendum:  ROS + for wheezing, cough.  Patient denies any fever, chills, abdominal discomfort, nausea, emesis, new focal neurological symptoms, hemoptysis  Objective: Filed Vitals:   05/01/12 0139 05/01/12 0442 05/01/12 0600 05/01/12 0800  BP:   154/66 130/76  Pulse:   116   Temp:   98 F (36.7 C) 97.6 F (36.4 C)  TempSrc:   Oral Oral  Resp:   22   Height:      Weight:      SpO2: 99% 100% 98%    Weight change:   Intake/Output Summary (Last 24 hours) at 05/01/12 0814 Last data filed at 05/01/12 0600  Gross per 24 hour  Intake    670 ml  Output    100 ml  Net    570 ml    General: Alert, awake, oriented x3, in no acute distress.  HEENT: No bruits, no goiter.  Heart: Regular rate and rhythm, without murmurs, rubs, gallops.  Lungs: bilateral air movement, expiratory wheezes BL diffusely. Patient is able to speak in full sentences Abdomen: Soft, nontender, nondistended, positive bowel sounds, obese abdomen with panus Neuro: Grossly intact, nonfocal.   Lab Results:  [facility] 04/30/12 2120  NA 138  K 2.7*  CL 102  CO2 24  GLUCOSE 204*  BUN 6  CREATININE 0.75  CALCIUM 8.6  MG --  PHOS --   No results found for this basename: AST:2,ALT:2,ALKPHOS:2,BILITOT:2,PROT:2,ALBUMIN:2 in the last 72 hours No results found for this basename: LIPASE:2,AMYLASE:2 in the last 72 hours  Basename 05/01/12 0709 04/30/12 2120  WBC 8.5 9.2  NEUTROABS -- 5.7  HGB 9.5* 9.6*  HCT 33.2* 33.1*  MCV 67.8*  66.9*  PLT 327 344   No results found for this basename: CKTOTAL:3,CKMB:3,CKMBINDEX:3,TROPONINI:3 in the last 72 hours No components found with this basename: POCBNP:3 No results found for this basename: DDIMER:2 in the last 72 hours No results found for this basename: HGBA1C:2 in the last 72 hours No results found for this basename: CHOL:2,HDL:2,LDLCALC:2,TRIG:2,CHOLHDL:2,LDLDIRECT:2 in the last 72 hours No results found for this basename: TSH,T4TOTAL,FREET3,T3FREE,THYROIDAB in the last 72 hours No results found for this basename: VITAMINB12:2,FOLATE:2,FERRITIN:2,TIBC:2,IRON:2,RETICCTPCT:2 in the last 72 hours  Micro Results: Recent Results (from the past 240 hour(s))  MRSA PCR SCREENING     Status: Abnormal   Collection Time   05/01/12  1:43 AM      Component Value Range Status Comment   MRSA by PCR POSITIVE (*) NEGATIVE  Final     Studies/Results: Dg Chest Port 1 View  04/30/2012  *RADIOLOGY REPORT*  Clinical Data: Shortness of breath.  PORTABLE CHEST - 1 VIEW  Comparison: Chest 03/23/2012.  Findings: Lungs clear.  Heart size upper normal.  No pneumothorax or effusion.  IMPRESSION: No acute disease.  Original Report Authenticated By: Bernadene Bell. Maricela Curet, M.D.    Medications: I have reviewed the patient's current medications.   Patient Active Hospital Problem List: Asthma exacerbation attacks (01/23/2012) - Continue nebulizer treatments - Add spiriva - Monitor closely - should patient  remain stable and condition not deteriorate will plan on transferring to floor this afternoon - symbicort - supplemental Oxygen PRN - Continue solumedrol at current dose and frequency, will plan on decreasing with continued clinical improvement.  HTN (hypertension), malignant (10/29/2011) Last recorded blood pressure 130/76.  Currently well controlled off of antihypertensive medication.  Will continue to monitor. Patient reportedly was on hctz but presents with hypokalemia.  Will continue to  hold.  GERD (gastroesophageal reflux disease) () Stable currently patient is on pantoprazole  Chronic anemia () - Check anemia panel if not already done - MCV low at 67, suspecting Iron deficiency anemia - Hemoglobin stable with no active bleeding last value 9.5 - Pending results will make recommendation.  Type 2 diabetes mellitus () - Carb modified diet - Continue SSI - last blood sugar recorded reading 264, likely not well controlled due to steroid administration. - continue to monitor blood sugars  Tachycardia (03/23/2012) Sinus tachycardia.  - Continue to monitor closely on tele - Though to be from side effect of albuterol  Hypokalemia (03/23/2012) Was replaced with IV potassium and oral supplementation.  Will recheck value this morning after replacement and pending results will make further recommendations.  Last value was 2.7.       LOS: 1 day   [name] M.D.  [facility] 05/01/2012, 8:14 AM

## 2012-05-01 NOTE — Progress Notes (Signed)
RN called to pt's room. Pt was very upset because she was unable to express her needs due to the fact that the person answering the call lights was hanging up before she was finished. Pt had to call three times. Pt also upset about about having to wait an hour for a breathing treatment. She took her own Benadryl because she said she had asked for it while she was in ICU and never got it. This was not reported to me. Pt threatened to leave AMA. MD was notified and RN and CN went in and spoke with pt about staying in the hospital since she was having so much trouble breathing. Pt was going to consider. Will follow up. K. Vear Clock RN

## 2012-05-01 NOTE — Progress Notes (Signed)
CARE MANAGEMENT NOTE 05/01/2012  Patient:  [name] [name]   Account Number:  [id number]  Date Initiated:  05/01/2012  Documentation initiated by:  Donshay Lupinski  Subjective/Objective Assessment:   pt with hx of copd, asthma and intubations admitted into sdu with o2 less than 89%. No relief from home nebs.     Action/Plan:   lives at home   Anticipated DC Date:  05/02/2012   Anticipated DC Plan:  HOME/SELF CARE  In-house referral  NA      DC Planning Services  NA      [facility] Choice  NA   Choice offered to / List presented to:  NA   DME arranged  NA      DME agency  NA     HH arranged  NA      HH agency  NA   Status of service:  In process, will continue to follow Medicare Important Message given?  NA - LOS <3 / Initial given by admissions (If response is "NO", the following Medicare IM given date fields will be blank) Date Medicare IM given:   Date Additional Medicare IM given:    Discharge Disposition:    Per UR Regulation:  Reviewed for med. necessity/level of care/duration of stay  If discussed at Long Length of Stay Meetings, dates discussed:    Comments:  [id number]/[name] [name], BSN, CCM: [phone] Case Management Chart review for utilization review. No discharge need present at time of this review.

## 2012-05-01 NOTE — Progress Notes (Signed)
Inpatient Diabetes Program Recommendations  AACE/ADA: New Consensus Statement on Inpatient Glycemic Control (2009)  Target Ranges:  Prepandial:   less than 140 mg/dL      Peak postprandial:   less than 180 mg/dL (1-2 hours)      Critically ill patients:  140 - 180 mg/dL   Reason for Visit: Results for [name] [name], [name] [name] (MRN [id number]) as of 05/01/2012 10:59  Ref. Range 05/01/2012 07:46  Glucose-Capillary Latest Range: 70-99 mg/dL 782 (H)    Inpatient Diabetes Program Recommendations Correction (SSI): Increase Correction Novolog coverage to resistant while on IV steroids-Also may consider q 4 hour coverage. HgbA1C: 03/23/12=7.4%.  Note: Will follow.

## 2012-05-02 DIAGNOSIS — E119 Type 2 diabetes mellitus without complications: Secondary | ICD-10-CM

## 2012-05-02 DIAGNOSIS — E876 Hypokalemia: Secondary | ICD-10-CM

## 2012-05-02 DIAGNOSIS — R000 Tachycardia, unspecified: Secondary | ICD-10-CM

## 2012-05-02 DIAGNOSIS — J45901 Unspecified asthma with (acute) exacerbation: Secondary | ICD-10-CM

## 2012-05-02 LAB — GLUCOSE, CAPILLARY
Glucose-Capillary: 259 mg/dL — ABNORMAL HIGH (ref 70–99)
Glucose-Capillary: 242 mg/dL — ABNORMAL HIGH (ref 70–99)

## 2012-05-02 MED ORDER — LEVOFLOXACIN 500 MG PO TABS
500.0000 mg | ORAL_TABLET | Freq: Every day | ORAL | Status: DC
  Administered 2012-05-02: 500 mg via ORAL
  Filled 2012-05-02 (×2): qty 1

## 2012-05-02 MED ORDER — LEVALBUTEROL HCL 1.25 MG/0.5ML IN NEBU
1.2500 mg | INHALATION_SOLUTION | Freq: Four times a day (QID) | RESPIRATORY_TRACT | Status: DC
  Administered 2012-05-02 – 2012-05-03 (×4): 1.25 mg via RESPIRATORY_TRACT
  Filled 2012-05-02 (×8): qty 0.5

## 2012-05-02 MED ORDER — METHYLPREDNISOLONE SODIUM SUCC 125 MG IJ SOLR
60.0000 mg | Freq: Three times a day (TID) | INTRAMUSCULAR | Status: DC
  Administered 2012-05-02 – 2012-05-03 (×3): 60 mg via INTRAVENOUS
  Filled 2012-05-02 (×6): qty 0.96

## 2012-05-02 MED ORDER — HYDROCHLOROTHIAZIDE 25 MG PO TABS
25.0000 mg | ORAL_TABLET | Freq: Every day | ORAL | Status: DC
  Administered 2012-05-03: 25 mg via ORAL
  Filled 2012-05-02: qty 1

## 2012-05-02 NOTE — Progress Notes (Signed)
PHARMACIST - PHYSICIAN COMMUNICATION DR:   Cena Benton CONCERNING: Antibiotic IV to Oral Route Change Policy  RECOMMENDATION: This patient is receiving Levaquin by the intravenous route.  Based on criteria approved by the Pharmacy and Therapeutics Committee, the antibiotic(s) is/are being converted to the equivalent oral dose form(s).   DESCRIPTION: These criteria include:  Patient being treated for a respiratory tract infection, urinary tract infection, or cellulitis  The patient is not neutropenic and does not exhibit a GI malabsorption state  The patient is eating (either orally or via tube) and/or has been taking other orally administered medications for a least 24 hours  The patient is improving clinically and has a Tmax < 100.5  If you have questions about this conversion, please contact the Pharmacy Department  []   [phone] )  [facility] []   [phone] )  [facility]  []   [phone] )  [facility] [x]   [phone] )  [facility]    Hessie Knows, PharmD, [place] Pager [phone] 05/02/2012 12:59 PM

## 2012-05-02 NOTE — Progress Notes (Signed)
Subjective: Patient was considering leaving AMA yesterday but decided to stay.  I have counseled patient and indicated that she needs to stay in the hospital and finish her treatment before transitioning out.  She is aware and assures me that she will stay and complete the recommended therapy.   No acute issues reported overnight and patient reports feeling better today. Denies any fever, chills, nausea, abdominal discomfort, hemoptysis.   Objective: Filed Vitals:   05/02/12 0824 05/02/12 1503 05/02/12 1554 05/02/12 1706  BP:    157/103  Pulse:  107  106  Temp:  98.3 F (36.8 C)    TempSrc:  Axillary    Resp:  20    Height:      Weight:      SpO2: 95% 95% 96%    Weight change:   Intake/Output Summary (Last 24 hours) at 05/02/12 1752 Last data filed at 05/02/12 0930  Gross per 24 hour  Intake   1150 ml  Output    300 ml  Net    850 ml    General: Alert, awake, oriented x3, in no acute distress. HEENT: No bruits, no goiter.  Heart: Regular rate and rhythm, without murmurs, rubs, gallops.  Lungs: Prolonged expiratory phase, Expiratory wheezes diffusely, bilateral air movement.  Abdomen: Soft, nontender, nondistended, positive bowel sounds.  Neuro: Grossly intact, nonfocal.   Lab Results:  Basename 05/01/12 0856 05/01/12 0709 04/30/12 2120  NA -- 135 138  K 3.6 3.6 --  CL -- 100 102  CO2 -- 21 24  GLUCOSE -- 283* 204*  BUN -- 6 6  CREATININE -- 0.67 0.75  CALCIUM -- 8.8 8.6  MG -- -- --  PHOS -- -- --   No results found for this basename: AST:2,ALT:2,ALKPHOS:2,BILITOT:2,PROT:2,ALBUMIN:2 in the last 72 hours No results found for this basename: LIPASE:2,AMYLASE:2 in the last 72 hours  Basename 05/01/12 0709 04/30/12 2120  WBC 8.5 9.2  NEUTROABS -- 5.7  HGB 9.5* 9.6*  HCT 33.2* 33.1*  MCV 67.8* 66.9*  PLT 327 344   No results found for this basename: CKTOTAL:3,CKMB:3,CKMBINDEX:3,TROPONINI:3 in the last 72 hours No components found with this basename:  POCBNP:3 No results found for this basename: DDIMER:2 in the last 72 hours No results found for this basename: HGBA1C:2 in the last 72 hours No results found for this basename: CHOL:2,HDL:2,LDLCALC:2,TRIG:2,CHOLHDL:2,LDLDIRECT:2 in the last 72 hours No results found for this basename: TSH,T4TOTAL,FREET3,T3FREE,THYROIDAB in the last 72 hours  Basename 05/01/12 0856  VITAMINB12 517  FOLATE 12.4  FERRITIN 4*  TIBC 444  IRON 13*  RETICCTPCT 1.8    Micro Results: Recent Results (from the past 240 hour(s))  MRSA PCR SCREENING     Status: Abnormal   Collection Time   05/01/12  1:43 AM      Component Value Range Status Comment   MRSA by PCR POSITIVE (*) NEGATIVE  Final     Studies/Results: Dg Chest Port 1 View  04/30/2012  *RADIOLOGY REPORT*  Clinical Data: Shortness of breath.  PORTABLE CHEST - 1 VIEW  Comparison: Chest 03/23/2012.  Findings: Lungs clear.  Heart size upper normal.  No pneumothorax or effusion.  IMPRESSION: No acute disease.  Original Report Authenticated By: Bernadene Bell. Maricela Curet, M.D.    Medications: I have reviewed the patient's current medications.   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  GERD (gastroesophageal reflux disease) () Stable currently patient is on pantoprazole  Chronic anemia () - Check anemia panel if not already done  - MCV low at 67, suspecting Iron deficiency anemia  - Most likely Iron deficiency anemia given low MCV and Iron level.  Pt is on ferrous fumarate currently  Type 2 diabetes mellitus () - Carb modified diet  - Continue SSI  - last blood sugar recorded reading 260, likely not well controlled due  to steroid administration.  - continue to monitor blood sugars   Tachycardia (03/23/2012) Sinus tachycardia.  - Continue to monitor closely on tele  - Though to be from side effect of albuterol so will switch to xopenex today.  Hypokalemia (03/23/2012) Was replaced with IV potassium and oral supplementation. Resolved currently.      LOS: 2 days   [name] M.D.  [facility] 05/02/2012, 5:52 PM

## 2012-05-02 NOTE — Clinical Documentation Improvement (Signed)
BMI DOCUMENTATION CLARIFICATION QUERY  THIS DOCUMENT IS NOT A PERMANENT PART OF THE MEDICAL RECORD  TO RESPOND TO THE THIS QUERY, FOLLOW THE INSTRUCTIONS BELOW:  1. If needed, update documentation for the patient's encounter via the notes activity.  2. Access this query again and click edit on the In [employer].  3. After updating, or not, click F2 to complete all highlighted (required) fields concerning your review. Select "additional documentation in the medical record" OR "no additional documentation provided".  4. Click Sign note button.  5. The deficiency will fall out of your In Basket *Please let us know if you are not able to complete this workflow by phone or e-mail (listed below).         05/02/12  Dear Dr. Cena Benton, [place] [place]  In an effort to better capture your patient's severity of illness, reflect appropriate length of stay and utilization of resources, a review of the patient medical record has revealed the following indicators.    Based on your clinical judgment, please clarify and document in a progress note and/or discharge summary the clinical condition associated with the following supporting information:  In responding to this query please exercise your independent judgment.  The fact that a query is asked, does not imply that any particular answer is desired or expected.  Pt admitted with asthma.  According to [facility] pt with Morbid Obesity and experiences respiratory distress  Pt's BMI=  48.25   Please clarify whether or not BMI can be linked to one of the diagnoses listed below and document in pn  and d/c. Thank You!  BEST PRACTICE: When linking BMI to a diagnosis please document both BMI and diagnosis together in pn for accuracy of SOI and ROM.    Possible Clinical conditions Please select all that apply and document in pn and d/c summary.  Morbid Obesity W/ BMI= 48.25  OHS (Obesity Hypoventilation Syndrome)  Underweight w/BMI=  Other  condition___________________  Cannot Clinically determine _____________  Risk Factors: Asthma, Morbid obesity  Sign & Symptoms: Weight: 298 lbs Height 5'6" BMI= 48.25    Treatment monitoring Low sodium heart healthy  Reviewed: additional documentation in the medical record ljh  Thank You,  Enis Slipper  RN, BSN, CCDS Clinical Documentation Specialist [facility] Dept Pager: [phone] / E-mail: [email or web address]   Health Information Management [place]

## 2012-05-03 DIAGNOSIS — J45901 Unspecified asthma with (acute) exacerbation: Secondary | ICD-10-CM

## 2012-05-03 DIAGNOSIS — E119 Type 2 diabetes mellitus without complications: Secondary | ICD-10-CM

## 2012-05-03 DIAGNOSIS — E876 Hypokalemia: Secondary | ICD-10-CM | POA: Diagnosis present

## 2012-05-03 DIAGNOSIS — D509 Iron deficiency anemia, unspecified: Secondary | ICD-10-CM | POA: Diagnosis present

## 2012-05-03 DIAGNOSIS — R000 Tachycardia, unspecified: Secondary | ICD-10-CM

## 2012-05-03 MED ORDER — AMLODIPINE BESYLATE 10 MG PO TABS: 10.0000 mg | ORAL_TABLET | Freq: Every day | ORAL | Status: DC

## 2012-05-03 MED ORDER — PANTOPRAZOLE SODIUM 40 MG PO TBEC: 40.0000 mg | DELAYED_RELEASE_TABLET | Freq: Every day | ORAL | Status: DC

## 2012-05-03 MED ORDER — BUDESONIDE-FORMOTEROL FUMARATE 160-4.5 MCG/ACT IN AERO: 2.0000 | INHALATION_SPRAY | Freq: Two times a day (BID) | RESPIRATORY_TRACT | Status: DC

## 2012-05-03 MED ORDER — AMLODIPINE BESYLATE 5 MG PO TABS
5.0000 mg | ORAL_TABLET | Freq: Every day | ORAL | Status: DC
  Administered 2012-05-03: 5 mg via ORAL
  Filled 2012-05-03: qty 1

## 2012-05-03 MED ORDER — AMLODIPINE BESYLATE 10 MG PO TABS
10.0000 mg | ORAL_TABLET | ORAL | Status: AC
  Administered 2012-05-03: 10 mg via ORAL
  Filled 2012-05-03: qty 1

## 2012-05-03 MED ORDER — PREDNISONE 20 MG PO TABS: ORAL_TABLET | ORAL | Status: AC

## 2012-05-03 MED ORDER — LEVALBUTEROL HCL 0.63 MG/3ML IN NEBU
0.6300 mg | INHALATION_SOLUTION | Freq: Four times a day (QID) | RESPIRATORY_TRACT | Status: DC | PRN
  Administered 2012-05-03: 0.63 mg via RESPIRATORY_TRACT
  Filled 2012-05-03: qty 3

## 2012-05-03 MED ORDER — LEVOFLOXACIN 500 MG PO TABS: 500.0000 mg | ORAL_TABLET | Freq: Every day | ORAL | Status: AC

## 2012-05-03 MED ORDER — METFORMIN HCL 500 MG PO TABS: 500.0000 mg | ORAL_TABLET | Freq: Two times a day (BID) | ORAL | Status: DC

## 2012-05-03 MED ORDER — IRBESARTAN-HYDROCHLOROTHIAZIDE 300-25 MG PO TABS: 1.0000 | ORAL_TABLET | Freq: Every day | ORAL | Status: DC

## 2012-05-03 MED ORDER — LEVALBUTEROL TARTRATE 45 MCG/ACT IN AERO: 2.0000 | INHALATION_SPRAY | RESPIRATORY_TRACT | Status: DC | PRN

## 2012-05-03 MED ORDER — GUAIFENESIN ER 600 MG PO TB12: 600.0000 mg | ORAL_TABLET | Freq: Two times a day (BID) | ORAL | Status: DC

## 2012-05-03 NOTE — Progress Notes (Signed)
Patient discharged to family

## 2012-05-03 NOTE — Progress Notes (Signed)
Discharge summary sent to payer through [facility]  

## 2012-05-03 NOTE — Discharge Instructions (Signed)
Asthma Attack Prevention HOW CAN ASTHMA BE PREVENTED? Currently, there is no way to prevent asthma from starting. However, you can take steps to control the disease and prevent its symptoms after you have been diagnosed. Learn about your asthma and how to control it. Take an active role to control your asthma by working with your caregiver to create and follow an asthma action plan. An asthma action plan guides you in taking your medicines properly, avoiding factors that make your asthma worse, tracking your level of asthma control, responding to worsening asthma, and seeking emergency care when needed. To track your asthma, keep records of your symptoms, check your peak flow number using a peak flow meter (handheld device that shows how well air moves out of your lungs), and get regular asthma checkups.  Other ways to prevent asthma attacks include:  Use medicines as your caregiver directs.   Identify and avoid things that make your asthma worse (as much as you can).   Keep track of your asthma symptoms and level of control.   Get regular checkups for your asthma.   With your caregiver, write a detailed plan for taking medicines and managing an asthma attack. Then be sure to follow your action plan. Asthma is an ongoing condition that needs regular monitoring and treatment.   Identify and avoid asthma triggers. A number of outdoor allergens and irritants (pollen, mold, cold air, air pollution) can trigger asthma attacks. Find out what causes or makes your asthma worse, and take steps to avoid those triggers (see below).   Monitor your breathing. Learn to recognize warning signs of an attack, such as slight coughing, wheezing or shortness of breath. However, your lung function may already decrease before you notice any signs or symptoms, so regularly measure and record your peak airflow with a home peak flow meter.   Identify and treat attacks early. If you act quickly, you're less likely to have  a severe attack. You will also need less medicine to control your symptoms. When your peak flow measurements decrease and alert you to an upcoming attack, take your medicine as instructed, and immediately stop any activity that may have triggered the attack. If your symptoms do not improve, get medical help.   Pay attention to increasing quick-relief inhaler use. If you find yourself relying on your quick-relief inhaler (such as albuterol), your asthma is not under control. See your caregiver about adjusting your treatment.  IDENTIFY AND CONTROL FACTORS THAT MAKE YOUR ASTHMA WORSE A number of common things can set off or make your asthma symptoms worse (asthma triggers). Keep track of your asthma symptoms for several weeks, detailing all the environmental and emotional factors that are linked with your asthma. When you have an asthma attack, go back to your asthma diary to see which factor, or combination of factors, might have contributed to it. Once you know what these factors are, you can take steps to control many of them.  Allergies: If you have allergies and asthma, it is important to take asthma prevention steps at home. Asthma attacks (worsening of asthma symptoms) can be triggered by allergies, which can cause temporary increased inflammation of your airways. Minimizing contact with the substance to which you are allergic will help prevent an asthma attack. Animal Dander:   Some people are allergic to the flakes of skin or dried saliva from animals with fur or feathers. Keep these pets out of your home.   If you can't keep a pet outdoors, keep the   pet out of your bedroom and other sleeping areas at all times, and keep the door closed.   Remove carpets and furniture covered with cloth from your home. If that is not possible, keep the pet away from fabric-covered furniture and carpets.  Dust Mites:  Many people with asthma are allergic to dust mites. Dust mites are tiny bugs that are found in  every home, in mattresses, pillows, carpets, fabric-covered furniture, bedcovers, clothes, stuffed toys, fabric, and other fabric-covered items.   Cover your mattress in a special dust-proof cover.   Cover your pillow in a special dust-proof cover, or wash the pillow each week in hot water. Water must be hotter than 130 F to kill dust mites. Cold or warm water used with detergent and bleach can also be effective.   Wash the sheets and blankets on your bed each week in hot water.   Try not to sleep or lie on cloth-covered cushions.   Call ahead when traveling and ask for a smoke-free hotel room. Bring your own bedding and pillows, in case the hotel only supplies feather pillows and down comforters, which may contain dust mites and cause asthma symptoms.   Remove carpets from your bedroom and those laid on concrete, if you can.   Keep stuffed toys out of the bed, or wash the toys weekly in hot water or cooler water with detergent and bleach.  Cockroaches:  Many people with asthma are allergic to the droppings and remains of cockroaches.   Keep food and garbage in closed containers. Never leave food out.   Use poison baits, traps, powders, gels, or paste (for example, boric acid).   If a spray is used to kill cockroaches, stay out of the room until the odor goes away.  Indoor Mold:  Fix leaky faucets, pipes, or other sources of water that have mold around them.   Clean moldy surfaces with a cleaner that has bleach in it.  Pollen and Outdoor Mold:  When pollen or mold spore counts are high, try to keep your windows closed.   Stay indoors with windows closed from late morning to afternoon, if you can. Pollen and some mold spore counts are highest at that time.   Ask your caregiver whether you need to take or increase anti-inflammatory medicine before your allergy season starts.  Irritants:   Tobacco smoke is an irritant. If you smoke, ask your caregiver how you can quit. Ask family  members to quit smoking, too. Do not allow smoking in your home or car.   If possible, do not use a wood-burning stove, kerosene heater, or fireplace. Minimize exposure to all sources of smoke, including incense, candles, fires, and fireworks.   Try to stay away from strong odors and sprays, such as perfume, talcum powder, hair spray, and paints.   Decrease humidity in your home and use an indoor air cleaning device. Reduce indoor humidity to below 60 percent. Dehumidifiers or central air conditioners can do this.   Try to have someone else vacuum for you once or twice a week, if you can. Stay out of rooms while they are being vacuumed and for a short while afterward.   If you vacuum, use a dust mask from a hardware store, a double-layered or microfilter vacuum cleaner bag, or a vacuum cleaner with a HEPA filter.   Sulfites in foods and beverages can be irritants. Do not drink beer or wine, or eat dried fruit, processed potatoes, or shrimp if they cause asthma   symptoms.   Cold air can trigger an asthma attack. Cover your nose and mouth with a scarf on cold or windy days.   Several health conditions can make asthma more difficult to manage, including runny nose, sinus infections, reflux disease, psychological stress, and sleep apnea. Your caregiver will treat these conditions, as well.   Avoid close contact with people who have a cold or the flu, since your asthma symptoms may get worse if you catch the infection from them. Wash your hands thoroughly after touching items that may have been handled by people with a respiratory infection.   Get a flu shot every year to protect against the flu virus, which often makes asthma worse for days or weeks. Also get a pneumonia shot once every five to 10 years.  Drugs:  Aspirin and other painkillers can cause asthma attacks. 10% to 20% of people with asthma have sensitivity to aspirin or a group of painkillers called non-steroidal anti-inflammatory drugs  (NSAIDS), such as ibuprofen and naproxen. These drugs are used to treat pain and reduce fevers. Asthma attacks caused by any of these medicines can be severe and even fatal. These drugs must be avoided in people who have known aspirin sensitive asthma. Products with acetaminophen are considered safe for people who have asthma. It is important that people with aspirin sensitivity read labels of all over-the-counter drugs used to treat pain, colds, coughs, and fever.   Beta blockers and ACE inhibitors are other drugs which you should discuss with your caregiver, in relation to your asthma.  ALLERGY SKIN TESTING  Ask your asthma caregiver about allergy skin testing or blood testing (RAST test) to identify the allergens to which you are sensitive. If you are found to have allergies, allergy shots (immunotherapy) for asthma may help prevent future allergies and asthma. With allergy shots, small doses of allergens (substances to which you are allergic) are injected under your skin on a regular schedule. Over a period of time, your body may become used to the allergen and less responsive with asthma symptoms. You can also take measures to minimize your exposure to those allergens. EXERCISE  If you have exercise-induced asthma, or are planning vigorous exercise, or exercise in cold, humid, or dry environments, prevent exercise-induced asthma by following your caregiver's advice regarding asthma treatment before exercising. Document Released: 11/03/2009 Document Revised: 11/04/2011 Document Reviewed: 11/03/2009 [employer] Patient Information 2012 [employer]. 

## 2012-05-03 NOTE — Progress Notes (Addendum)
Patients BP 167/114, HR 118.  Patient refusing to stay for blood pressure follow up reading in 1 hr.  States she is aware of her high blood pressure and risks and still wants to leave the hospital.

## 2012-05-03 NOTE — Discharge Summary (Signed)
Patient ID: Machele Deihl MRN: [id number] DOB/AGE: [birth year] 36 y.o.  Admit date: 04/30/2012 Discharge date: 05/03/2012  Primary Care Physician:  No Pcp  Discharge Diagnoses:     Principal Problem:  *Acute asthma exacerbation  Active Problems:  uncontrolled HTN  Type 2 diabetes mellitus  GERD (gastroesophageal reflux disease)  Chronic anemia  Tachycardia  Morbid obesity hypokalemia   Medication List  As of 05/03/2012 12:02 PM   TAKE these medications         amLODipine 10 MG tablet   Commonly known as: NORVASC   Take 1 tablet (10 mg total) by mouth daily.      budesonide-formoterol 160-4.5 MCG/ACT inhaler   Commonly known as: SYMBICORT   Inhale 2 puffs into the lungs 2 (two) times daily.      diphenhydrAMINE 50 MG tablet   Commonly known as: BENADRYL   Take 50 mg by mouth at bedtime as needed. For allergies        ferrous fumarate 325 (106 FE) MG Tabs   Commonly known as: HEMOCYTE - 106 mg FE   Take 1 tablet by mouth daily.      fluticasone 50 MCG/ACT nasal spray   Commonly known as: FLONASE   Place 2 sprays into the nose daily.      folic acid 400 MCG tablet   Commonly known as: FOLVITE   Take 400 mcg by mouth daily.      guaiFENesin 600 MG 12 hr tablet   Commonly known as: MUCINEX   Take 1 tablet (600 mg total) by mouth 2 (two) times daily.      irbesartan-hydrochlorothiazide 300-25 MG per tablet   Commonly known as: AVALIDE   Take 1 tablet by mouth daily.      levalbuterol 45 MCG/ACT inhaler   Commonly known as: XOPENEX HFA   Inhale 2 puffs into the lungs every 4 (four) hours as needed for wheezing or shortness of breath.      levofloxacin 500 MG tablet   Commonly known as: LEVAQUIN   Take 1 tablet (500 mg total) by mouth at bedtime.      loratadine 10 MG tablet   Commonly known as: CLARITIN   Take 10 mg by mouth daily.      metFORMIN 500 MG tablet   Commonly known as: GLUCOPHAGE   Take 1 tablet (500 mg total) by mouth 2 (two) times daily  with a meal. Takes only when taking Prednisone taper to avoid elevation of blood sugar levels      multivitamin capsule   Take 1 capsule by mouth daily.      pantoprazole 40 MG tablet   Commonly known as: PROTONIX   Take 1 tablet (40 mg total) by mouth daily.      predniSONE 20 MG tablet   Commonly known as: DELTASONE   Tapering dose.  Take 40 mg daily for 3 days, then 30 mg daily for next 3 days, then 20 mg daily for next 3 days then 10 mg daily for next 3 days then stop            Disposition and Follow-up:  Home with outpatient follow up with [facility] in 2 weeks.  patient is in the process of getting a PCP in the community  Consults:  NONE  Significant Diagnostic Studies:  Dg Chest Port 1 View  04/30/2012  *RADIOLOGY REPORT*  Clinical Data: Shortness of breath.  PORTABLE CHEST - 1 VIEW  Comparison: Chest 03/23/2012.  Findings: Lungs  clear.  Heart size upper normal.  No pneumothorax or effusion.  IMPRESSION: No acute disease.  Original Report Authenticated By: Bernadene Bell. D'ALESSIO, M.D.    Brief H and P: For complete details please refer to admission H and P, but in brief 36 y.o. female, With significant past medical history as mentioned above, and it was along the ED with complaints of shortness of breath cough wheezing productive sputum yellow in color, patient has multiple admissions secondary to COPD exacerbation and history of intubation as well, where she has a history of 2 intubation secondary to status asthmaticus, it was tachycardic upon presentation audible wheezing received 3 sets of albuterol nebulizer at home 2 in ambulance and 1 hour albuterol inhalation treatment in ED, patient reports she has been taking Symbicort regularly, as well patient was found to have potassium of 2.7, she is not to have history of recurrent hypokalemia, she is on hydrochlorothiazide at home and she isn't not on regular potassium supplement,   Physical Exam on Discharge:  Filed  Vitals:   05/03/12 0004 05/03/12 0549 05/03/12 0612 05/03/12 0836  BP: 152/95  159/96   Pulse:   104   Temp:   98.3 F (36.8 C)   TempSrc:   Oral   Resp:   18   Height:      Weight:      SpO2:  100% 94% 94%     Intake/Output Summary (Last 24 hours) at 05/03/12 1202 Last data filed at 05/03/12 0700  Gross per 24 hour  Intake    840 ml  Output      0 ml  Net    840 ml    General: morbidly obese female , Alert, awake, oriented x3, in no acute distress. HEENT: No bruits, no goiter. Heart: Regular rate and rhythm, without murmurs, rubs, gallops. Lungs: Clear to auscultation bilaterally. No rhonchi or wheeze Abdomen: Soft, nontender, nondistended, positive bowel sounds. Extremities: No clubbing cyanosis or edema with positive pedal pulses. Neuro: Grossly intact, nonfocal.  CBC:    Component Value Date/Time   WBC 8.5 05/01/2012 0709   HGB 9.5* 05/01/2012 0709   HCT 33.2* 05/01/2012 0709   PLT 327 05/01/2012 0709   MCV 67.8* 05/01/2012 0709   NEUTROABS 5.7 04/30/2012 2120   LYMPHSABS 1.7 04/30/2012 2120   MONOABS 0.8 04/30/2012 2120   EOSABS 1.0* 04/30/2012 2120   BASOSABS 0.0 04/30/2012 2120    Basic Metabolic Panel:    Component Value Date/Time   NA 135 05/01/2012 0709   K 3.6 05/01/2012 0856   CL 100 05/01/2012 0709   CO2 21 05/01/2012 0709   BUN 6 05/01/2012 0709   CREATININE 0.67 05/01/2012 0709   GLUCOSE 283* 05/01/2012 0709   CALCIUM 8.8 05/01/2012 0709    Hospital Course:  Acute exacerbation of asthma -patient placed on IV steroids and nebs with improvement started on po levofloxacin Symptoms now improving. Patient on symbicort at home  she was noted to be tachycardic on albuterol nebs which improve after changing to xopenex. Will d/c her on xopenex inhalers and tapering dose of po prednisone and 2 more days of levaquin.. Can resume her home symbicort.  Uncontrolled HTN -HCTZ was initially held as patient had hyperkalemia. Now resumed . Will d/c patient on her home BP meds (  irbesartan-HCTZ) . Will also added low dose  Amlodipine this admission. BP still elevated , will discharge on 10 mg po daily of amlodipine as well.     GERD (gastroesophageal reflux  disease)  takes pantoprazole at home and can continue on discharge.    Chronic iron deficiency   anemia - MCV low at 67, low iron and sats. Already on ferous fumarate at [facility] and should continue   Type 2 diabetes mellitus -likely secondary to recurrent steroid use and contrin=buted by her morbid obesity -Last A1C 2 months back of 7.4.  she takes metformin off on while on prednisone. i will give her prescription for metformin 500 mg bid for her diabetes.  she informs having glucometer at home.  she is advised to establish care with a PCP in the community. Informs looking for a PCP as she now decides to live in [place].  Sinus Tachycardia  stable on tele  - Though to be from side effect of albuterol so will switch to xopenex and has been stable.  Will discharge on xopenex inhalers and follow up with pulmonary as outpateint   Hypokalemia  Resolved with supplementation    Patient clinically stable for discharge Home with outpatient follow up.   counsled on medication adherence and establish a new PCP in the community for her ongoing medical issues.      Time spent on Discharge: 45 minutes  Signed: Eddie North 05/03/2012, 12:02 PM

## 2012-05-11 ENCOUNTER — Inpatient Hospital Stay: Payer: Medicare Other | Admitting: Adult Health

## 2012-05-17 ENCOUNTER — Inpatient Hospital Stay: Payer: Medicare Other | Admitting: Adult Health

## 2012-05-18 ENCOUNTER — Telehealth: Payer: Self-pay | Admitting: Adult Health

## 2012-05-18 NOTE — Telephone Encounter (Signed)
Spoke with pt and scheduled appt with TP for HFU for 05-31-12.

## 2012-05-18 NOTE — Telephone Encounter (Signed)
Try to reschedule Several ER and hospital admits

## 2012-05-31 ENCOUNTER — Inpatient Hospital Stay: Payer: Medicare Other | Admitting: Adult Health

## 2012-07-30 ENCOUNTER — Emergency Department (HOSPITAL_COMMUNITY)
Admission: EM | Admit: 2012-07-30 | Discharge: 2012-07-31 | Disposition: A | Discharge status: Home / Self Care | Payer: Medicare Other | Attending: Emergency Medicine | Admitting: Emergency Medicine

## 2012-07-30 DIAGNOSIS — Z91013 Allergy to seafood: Secondary | ICD-10-CM | POA: Insufficient documentation

## 2012-07-30 DIAGNOSIS — E119 Type 2 diabetes mellitus without complications: Secondary | ICD-10-CM | POA: Insufficient documentation

## 2012-07-30 DIAGNOSIS — J45909 Unspecified asthma, uncomplicated: Secondary | ICD-10-CM

## 2012-07-30 DIAGNOSIS — G4730 Sleep apnea, unspecified: Secondary | ICD-10-CM | POA: Insufficient documentation

## 2012-07-30 DIAGNOSIS — R000 Tachycardia, unspecified: Secondary | ICD-10-CM | POA: Insufficient documentation

## 2012-07-30 DIAGNOSIS — Z886 Allergy status to analgesic agent status: Secondary | ICD-10-CM | POA: Insufficient documentation

## 2012-07-30 DIAGNOSIS — J45901 Unspecified asthma with (acute) exacerbation: Secondary | ICD-10-CM | POA: Insufficient documentation

## 2012-07-30 DIAGNOSIS — K219 Gastro-esophageal reflux disease without esophagitis: Secondary | ICD-10-CM | POA: Insufficient documentation

## 2012-07-30 DIAGNOSIS — I10 Essential (primary) hypertension: Secondary | ICD-10-CM | POA: Insufficient documentation

## 2012-07-30 MED ORDER — ONDANSETRON HCL 4 MG/2ML IJ SOLN
INTRAMUSCULAR | Status: AC
  Administered 2012-07-30: 23:00:00
  Filled 2012-07-30: qty 2

## 2012-07-30 MED ORDER — IPRATROPIUM BROMIDE 0.02 % IN SOLN
0.5000 mg | Freq: Once | RESPIRATORY_TRACT | Status: AC
  Administered 2012-07-30: 0.5 mg via RESPIRATORY_TRACT
  Filled 2012-07-30: qty 2.5

## 2012-07-30 MED ORDER — ALBUTEROL SULFATE (5 MG/ML) 0.5% IN NEBU
INHALATION_SOLUTION | RESPIRATORY_TRACT | Status: AC
  Administered 2012-07-30: 23:00:00
  Filled 2012-07-30: qty 2

## 2012-07-30 MED ORDER — METHYLPREDNISOLONE SODIUM SUCC 125 MG IJ SOLR
INTRAMUSCULAR | Status: AC
  Administered 2012-07-30: 23:00:00
  Filled 2012-07-30: qty 2

## 2012-07-30 MED ORDER — IPRATROPIUM BROMIDE 0.02 % IN SOLN
RESPIRATORY_TRACT | Status: AC
  Administered 2012-07-30: 23:00:00
  Filled 2012-07-30: qty 2.5

## 2012-07-30 MED ORDER — ALBUTEROL SULFATE (5 MG/ML) 0.5% IN NEBU
5.0000 mg | INHALATION_SOLUTION | Freq: Once | RESPIRATORY_TRACT | Status: AC
  Administered 2012-07-30: 5 mg via RESPIRATORY_TRACT
  Filled 2012-07-30: qty 1

## 2012-07-30 NOTE — ED Provider Notes (Signed)
History     CSN: [id number]  Arrival date & time 07/30/12  2130   First MD Initiated Contact with Patient 07/30/12 2250      Chief Complaint  Patient presents with  . Wheezing    (Consider location/radiation/quality/duration/timing/severity/associated sxs/prior treatment) HPI History provided by pt.   Pt presents w/ c/o asthma attack.  Pt has had gradually worsening SOB, wheezing, diffuse chest and upper back tightness and cough for the past month.  Aggravated by ambulation.  Takes albuterol nebs and symbicort at home and is no longer having relief w/ these medications.  Currently feels better after receiving 2 breathing treatments and solumedrol en route.  Associated w/ tactile fever. Sx are typical.   Pt has been admitted several times and intubated twice, most recently last year, for asthma exacerbations.      Past Medical History  Diagnosis Date  . Asthma   . Hypertension   . Chronic anemia   . Abnormal TSH   . Morbid obesity   . GERD (gastroesophageal reflux disease)   . Seasonal allergies   . Nasal polyps   . Diabetes mellitus     type 2  . Chronic bronchitis   . Sleep apnea   . Shortness of breath 12/14/11    "related to how bad my asthma is; sometimes @ rest, lying down, w/exertion"  . Pneumonia   . Blood transfusion   . Inguinal hernia unilateral, non-recurrent     right  . Kidney infection 2008  . Headache   . Anxiety   . Depression   . PTSD (post-traumatic stress disorder)     "related to prior hospitalizations; things that happened when I was in hospital"    Past Surgical History  Procedure Date  . Cervical cerclage 1999; 2002  . Tubal ligation 2002    Family History  Problem Relation Age of Onset  . Diabetes Mother   . Heart disease Father     History  Substance Use Topics  . Smoking status: Never Smoker   . Smokeless tobacco: Never Used  . Alcohol Use: 0.6 oz/week    1 Glasses of wine per week     12/14/11 "only on birthdays; parties, and  such"    OB History    Grav Para Term Preterm Abortions TAB SAB Ect Mult Living                  Review of Systems  All other systems reviewed and are negative.    Allergies  Aspirin; Ibuprofen; Shellfish allergy; Menthol; and Other  Home Medications   Current Outpatient Rx  Name Route Sig Dispense Refill  . ALBUTEROL SULFATE (2.5 MG/3ML) 0.083% IN NEBU Nebulization Take 2.5 mg by nebulization every 4 (four) hours as needed. For asthma or shortness of breath.    . AMLODIPINE BESYLATE 10 MG PO TABS Oral Take 10 mg by mouth daily.    . BUDESONIDE-FORMOTEROL FUMARATE 160-4.5 MCG/ACT IN AERO Inhalation Inhale 2 puffs into the lungs 2 (two) times daily.    Marland Kitchen DM-GUAIFENESIN ER 30-600 MG PO TB12 Oral Take 1 tablet by mouth every 12 (twelve) hours.    Marland Kitchen DIPHENHYDRAMINE HCL 50 MG PO TABS Oral Take 50 mg by mouth at bedtime as needed. For allergies     . FLUTICASONE PROPIONATE 50 MCG/ACT NA SUSP Nasal Place 2 sprays into the nose daily.      Marland Kitchen FOLIC ACID 400 MCG PO TABS Oral Take 400 mcg by mouth daily.      Marland Kitchen  IRBESARTAN-HYDROCHLOROTHIAZIDE 300-25 MG PO TABS Oral Take 1 tablet by mouth daily.    Marland Kitchen METFORMIN HCL 500 MG PO TABS Oral Take 500 mg by mouth 2 (two) times daily with a meal.    . PANTOPRAZOLE SODIUM 40 MG PO TBEC Oral Take 40 mg by mouth daily.      BP 123/93  Pulse 101  Resp 23  SpO2 95%  Physical Exam  Nursing note and vitals reviewed. Constitutional: She is oriented to person, place, and time. She appears well-developed and well-nourished. No distress.       obese  HENT:  Head: Normocephalic and atraumatic.  Eyes:       Normal appearance  Neck: Normal range of motion.  Cardiovascular: Regular rhythm.        HR 104  Pulmonary/Chest: Effort normal. No respiratory distress.       Diffuse expiratory wheezing but moving air well  Musculoskeletal: Normal range of motion.  Neurological: She is alert and oriented to person, place, and time.  Skin: Skin is warm and dry.  No rash noted.  Psychiatric: She has a normal mood and affect. Her behavior is normal.    ED Course  Procedures (including critical care time)  Labs Reviewed - No data to display No results found.   1. Asthma       MDM  Pt presents w/ typical asthma exacerbation.  Pt has had several admissions as well as 2 intubations for asthma. Sx improved after received 2 breathing treatments and solumedrol en route.  On exam, no respiratory distress, mild tachycardia, diffuse exp wheezing.  Will give another breathing treatment and ambulate.  11:43 PM   On re-examination, wheezing improved, mildly tachycardic (likely secondary to albuterol), no respiratory distress and pt ambulates w/out difficulty.  D/t patient's PMH, discussed w/ Dr. Weldon Inches and he, the patient and myself are comfortable with discharge.  Pt d/c'd home w/ prednisone as well as a rescue inhaler.  Strict return precautions discussed.         Otilio Miu, [place] 07/31/12 [phone]

## 2012-07-30 NOTE — ED Notes (Signed)
Per [facility], pt has a significant hx of shortness of breath. Pt states that breathing complications occur when it rains. Inhaler works with minimal relief. Wheezing is primarily in the right lung. 10mg  Albuterol, 0.5mg  Atrovent, 125mg  Solumedrol, and 4mg  Zofran. Pt reports that Solumedrol makes her feel nauseous. Sinus tachycardia on monitor, breathing difficulty easing with nebulizer treatment.

## 2012-07-30 NOTE — ED Notes (Signed)
Bed:WA05<BR> Expected date:<BR> Expected time:<BR> Means of arrival:<BR> Comments:<BR> ems

## 2012-07-31 MED ORDER — ALBUTEROL SULFATE HFA 108 (90 BASE) MCG/ACT IN AERS
2.0000 | INHALATION_SPRAY | RESPIRATORY_TRACT | Status: DC | PRN
  Administered 2012-07-31: 2 via RESPIRATORY_TRACT
  Filled 2012-07-31: qty 6.7

## 2012-07-31 MED ORDER — PREDNISONE 10 MG PO TABS: 10.0000 mg | ORAL_TABLET | Freq: Two times a day (BID) | ORAL | Status: DC

## 2012-07-31 NOTE — ED Provider Notes (Signed)
Medical screening examination/treatment/procedure(s) were performed by non-physician practitioner and as supervising physician I was immediately available for consultation/collaboration.  Cheri Guppy, MD 07/31/12 [phone]

## 2012-09-01 ENCOUNTER — Encounter (HOSPITAL_COMMUNITY): Payer: Self-pay | Admitting: Emergency Medicine

## 2012-09-01 ENCOUNTER — Emergency Department (HOSPITAL_COMMUNITY)
Admission: EM | Admit: 2012-09-01 | Discharge: 2012-09-01 | Disposition: A | Discharge status: Home / Self Care | Payer: Medicare Other | Attending: Emergency Medicine | Admitting: Emergency Medicine

## 2012-09-01 DIAGNOSIS — F4310 Post-traumatic stress disorder, unspecified: Secondary | ICD-10-CM | POA: Insufficient documentation

## 2012-09-01 DIAGNOSIS — I10 Essential (primary) hypertension: Secondary | ICD-10-CM | POA: Insufficient documentation

## 2012-09-01 DIAGNOSIS — G4730 Sleep apnea, unspecified: Secondary | ICD-10-CM | POA: Insufficient documentation

## 2012-09-01 DIAGNOSIS — J45901 Unspecified asthma with (acute) exacerbation: Secondary | ICD-10-CM

## 2012-09-01 DIAGNOSIS — R0602 Shortness of breath: Secondary | ICD-10-CM | POA: Insufficient documentation

## 2012-09-01 DIAGNOSIS — J45909 Unspecified asthma, uncomplicated: Secondary | ICD-10-CM | POA: Insufficient documentation

## 2012-09-01 DIAGNOSIS — D649 Anemia, unspecified: Secondary | ICD-10-CM | POA: Insufficient documentation

## 2012-09-01 DIAGNOSIS — K219 Gastro-esophageal reflux disease without esophagitis: Secondary | ICD-10-CM | POA: Insufficient documentation

## 2012-09-01 MED ORDER — PREDNISONE 20 MG PO TABS: 60.0000 mg | ORAL_TABLET | Freq: Every day | ORAL | Status: DC

## 2012-09-01 MED ORDER — ALBUTEROL SULFATE (5 MG/ML) 0.5% IN NEBU
5.0000 mg | INHALATION_SOLUTION | Freq: Once | RESPIRATORY_TRACT | Status: AC
  Administered 2012-09-01: 5 mg via RESPIRATORY_TRACT
  Filled 2012-09-01: qty 0.5

## 2012-09-01 MED ORDER — ALBUTEROL SULFATE (5 MG/ML) 0.5% IN NEBU: 2.5000 mg | INHALATION_SOLUTION | RESPIRATORY_TRACT | Status: DC

## 2012-09-01 MED ORDER — IPRATROPIUM BROMIDE 0.02 % IN SOLN: 0.5000 mg | RESPIRATORY_TRACT | Status: DC

## 2012-09-01 MED ORDER — IPRATROPIUM BROMIDE 0.02 % IN SOLN
RESPIRATORY_TRACT | Status: AC
  Filled 2012-09-01: qty 2.5

## 2012-09-01 MED ORDER — IPRATROPIUM BROMIDE 0.02 % IN SOLN
0.5000 mg | Freq: Once | RESPIRATORY_TRACT | Status: AC
  Administered 2012-09-01: 0.5 mg via RESPIRATORY_TRACT
  Filled 2012-09-01: qty 2.5

## 2012-09-01 MED ORDER — ALBUTEROL SULFATE (5 MG/ML) 0.5% IN NEBU
5.0000 mg | INHALATION_SOLUTION | Freq: Once | RESPIRATORY_TRACT | Status: AC
  Administered 2012-09-01: 5 mg via RESPIRATORY_TRACT
  Filled 2012-09-01: qty 1

## 2012-09-01 MED ORDER — PREDNISONE 20 MG PO TABS
60.0000 mg | ORAL_TABLET | Freq: Once | ORAL | Status: AC
  Administered 2012-09-01: 60 mg via ORAL
  Filled 2012-09-01: qty 3

## 2012-09-01 MED ORDER — ALBUTEROL SULFATE (5 MG/ML) 0.5% IN NEBU
INHALATION_SOLUTION | RESPIRATORY_TRACT | Status: AC
  Filled 2012-09-01: qty 2

## 2012-09-01 MED ORDER — BUDESONIDE-FORMOTEROL FUMARATE 160-4.5 MCG/ACT IN AERO: 2.0000 | INHALATION_SPRAY | Freq: Two times a day (BID) | RESPIRATORY_TRACT | Status: DC

## 2012-09-01 MED ORDER — ALBUTEROL SULFATE (5 MG/ML) 0.5% IN NEBU
INHALATION_SOLUTION | RESPIRATORY_TRACT | Status: AC
  Filled 2012-09-01: qty 0.5

## 2012-09-01 MED ORDER — IPRATROPIUM-ALBUTEROL 0.5-2.5 (3) MG/3ML IN SOLN: 3.0000 mL | RESPIRATORY_TRACT | Status: DC | PRN

## 2012-09-01 MED ORDER — BUDESONIDE-FORMOTEROL FUMARATE 160-4.5 MCG/ACT IN AERO
2.0000 | INHALATION_SPRAY | Freq: Two times a day (BID) | RESPIRATORY_TRACT | Status: DC
  Filled 2012-09-01: qty 6

## 2012-09-01 MED ORDER — AZITHROMYCIN 250 MG PO TABS: 250.0000 mg | ORAL_TABLET | Freq: Every day | ORAL | Status: DC

## 2012-09-01 NOTE — ED Notes (Signed)
Staff ambulated with pt, pt O2 is 93% and HR at 113 when ambulating

## 2012-09-01 NOTE — ED Notes (Signed)
Pt presenting to ed with c/o shortness of breath pt states she is having problems with her allergies and sinuses and she thinks this is what's causing her to have a flare up. Pt denies chest pain, nausea and vomiting at this time

## 2012-09-01 NOTE — ED Provider Notes (Signed)
History     CSN: [id number]  Arrival date & time 09/01/12  1201   First MD Initiated Contact with Patient 09/01/12 1355      Chief Complaint  Patient presents with  . Shortness of Breath    (Consider location/radiation/quality/duration/timing/severity/associated sxs/prior treatment) HPI Comments: Patient with a history of Asthma presents today with a chief complaint of wheezing, shortness of breath, and tightness in her chest.  Symptoms started today.  She states that she tried using her Albuterol neb treatment at home, but did not feel that it helped.  She states that she ran out of her Symbicort daily inhaler one week ago.  She feels that this inhaler worked well for her.  She also states that she has used Duoneb in the past, which she feels has worked well for her.  She states that she has had sinus pressure and nasal congestion over the past week, which she believes caused her asthma to flare up.  Sinus pressure located over the frontal sinuses.  She has not taken anything for these symptoms.    Patient is a 36 y.o. female presenting with shortness of breath. The history is provided by the patient.  Shortness of Breath  The current episode started today. The problem has been gradually worsening. The problem is mild. Associated symptoms include rhinorrhea, shortness of breath and wheezing. Pertinent negatives include no chest pain, no fever and no cough.    Past Medical History  Diagnosis Date  . Asthma   . Hypertension   . Chronic anemia   . Abnormal TSH   . Morbid obesity   . GERD (gastroesophageal reflux disease)   . Seasonal allergies   . Nasal polyps   . Diabetes mellitus     type 2  . Chronic bronchitis   . Sleep apnea   . Shortness of breath 12/14/11    "related to how bad my asthma is; sometimes @ rest, lying down, w/exertion"  . Pneumonia   . Blood transfusion   . Inguinal hernia unilateral, non-recurrent     right  . Kidney infection 2008  . Headache   .  Anxiety   . Depression   . PTSD (post-traumatic stress disorder)     "related to prior hospitalizations; things that happened when I was in hospital"    Past Surgical History  Procedure Date  . Cervical cerclage 1999; 2002  . Tubal ligation 2002    Family History  Problem Relation Age of Onset  . Diabetes Mother   . Heart disease Father     History  Substance Use Topics  . Smoking status: Never Smoker   . Smokeless tobacco: Never Used  . Alcohol Use: No     12/14/11 "only on birthdays; parties, and such"    OB History    Grav Para Term Preterm Abortions TAB SAB Ect Mult Living                  Review of Systems  Constitutional: Negative for fever, chills and diaphoresis.  HENT: Positive for congestion, rhinorrhea and sinus pressure.   Respiratory: Positive for chest tightness, shortness of breath and wheezing. Negative for cough.   Cardiovascular: Negative for chest pain.  Gastrointestinal: Negative for nausea and vomiting.  Neurological: Negative for dizziness, syncope and light-headedness.    Allergies  Aspirin; Ibuprofen; Shellfish allergy; Menthol; and Other  Home Medications   Current Outpatient Rx  Name Route Sig Dispense Refill  . ALBUTEROL SULFATE (2.5 MG/3ML) 0.083% IN  NEBU Nebulization Take 2.5 mg by nebulization every 4 (four) hours as needed. For asthma or shortness of breath.    . BUDESONIDE-FORMOTEROL FUMARATE 160-4.5 MCG/ACT IN AERO Inhalation Inhale 2 puffs into the lungs 2 (two) times daily.    Marland Kitchen DIPHENHYDRAMINE HCL 50 MG PO TABS Oral Take 50 mg by mouth at bedtime as needed. For allergies     . FLUTICASONE PROPIONATE 50 MCG/ACT NA SUSP Nasal Place 2 sprays into the nose daily.      Marland Kitchen FOLIC ACID 400 MCG PO TABS Oral Take 400 mcg by mouth daily.      . IRBESARTAN-HYDROCHLOROTHIAZIDE 300-25 MG PO TABS Oral Take 1 tablet by mouth daily.    Marland Kitchen METFORMIN HCL 500 MG PO TABS Oral Take 500 mg by mouth 2 (two) times daily with a meal.    . POTASSIUM  GLUCONATE 595 MG PO TABS Oral Take 2,380 mg by mouth daily.     Marland Kitchen RANITIDINE HCL 150 MG PO CAPS Oral Take 600 mg by mouth at bedtime as needed.    Marland Kitchen AMLODIPINE BESYLATE 10 MG PO TABS Oral Take 10 mg by mouth daily.    Marland Kitchen PANTOPRAZOLE SODIUM 40 MG PO TBEC Oral Take 40 mg by mouth daily.      BP 146/88  Pulse 94  Temp 98.7 F (37.1 C) (Axillary)  Resp 18  SpO2 98%  LMP 08/29/2012  Physical Exam  Constitutional: She is oriented to person, place, and time. She appears well-developed and well-nourished. No distress.  HENT:  Head: Normocephalic and atraumatic.       Uvula midline, oropharynx clear and moist  Eyes: Conjunctivae normal and EOM are normal. Pupils are equal, round, and reactive to light.  Neck: Normal range of motion.       No stridor.  Neck supple with full range of motion.  Cardiovascular: Normal rate, regular rhythm, normal heart sounds and intact distal pulses.   Pulmonary/Chest: Effort normal. She has wheezes (expiratory).       Patient not in respiratory distress, no accessory muscle use, nasal flaring.  Patient able to speak in full sentences.  Airway intact.  Lung auscultation positive for bilateral expiratory wheezing  Abdominal: Soft. There is no tenderness.  Musculoskeletal: Normal range of motion. She exhibits no edema and no tenderness.  Neurological: She is alert and oriented to person, place, and time.  Skin: Skin is warm and dry. No rash noted. She is not diaphoretic.  Psychiatric: She has a normal mood and affect. Her behavior is normal.    ED Course  Procedures (including critical care time)  Labs Reviewed - No data to display No results found.   No diagnosis found.  3:45 PM Reassessed patient after her second breathing treatment.  She reports that her symptoms have improved, but she continues to have mild shortness of breath.  Lungs with mild diffuse expiratory wheezing.  Will order another breathing treatment and reassess.  5:00 PM Reassessed  patient.  Patient denies SOB at this time.  Diffuse mild expiratory wheezing.  Will check pulse ox with ambulation.    5:30 PM Patient ambulated. She did not become short of breath with ambulation.  Pulse ox remained at 93%.  MDM  Patient ambulated in ED with O2 saturations maintained >90, no current signs of respiratory distress. Lung exam improved after hospital treatment. Pt states they are breathing at baseline. Pt has been instructed to continue using prescribed medications and to speak with PCP about today's exacerbation. Patient given  5 day course of prednisone and prescription for Duoneb and Symbicort that she has been on in the past.          Magnus Sinning, PA-C 09/02/12 [phone]

## 2012-09-01 NOTE — ED Notes (Signed)
ZOX:[id number]<AV> Expected date:09/01/12<BR> Expected time:11:46 AM<BR> Means of arrival:Ambulance<BR> Comments:<BR> 31 Female SOB [employer]

## 2012-09-05 NOTE — ED Provider Notes (Signed)
Medical screening examination/treatment/procedure(s) were performed by non-physician practitioner and as supervising physician I was immediately available for consultation/collaboration.   Somer Trotter Y. Paden Senger, MD 09/05/12 2335 

## 2012-11-26 ENCOUNTER — Emergency Department (HOSPITAL_COMMUNITY): Payer: Medicare Other

## 2012-11-26 ENCOUNTER — Emergency Department (HOSPITAL_COMMUNITY)
Admission: EM | Admit: 2012-11-26 | Discharge: 2012-11-26 | Disposition: A | Discharge status: Home / Self Care | Payer: Medicare Other | Attending: Emergency Medicine | Admitting: Emergency Medicine

## 2012-11-26 DIAGNOSIS — J029 Acute pharyngitis, unspecified: Secondary | ICD-10-CM | POA: Insufficient documentation

## 2012-11-26 DIAGNOSIS — G4730 Sleep apnea, unspecified: Secondary | ICD-10-CM | POA: Insufficient documentation

## 2012-11-26 DIAGNOSIS — J40 Bronchitis, not specified as acute or chronic: Secondary | ICD-10-CM

## 2012-11-26 DIAGNOSIS — Z8701 Personal history of pneumonia (recurrent): Secondary | ICD-10-CM | POA: Insufficient documentation

## 2012-11-26 DIAGNOSIS — Z87448 Personal history of other diseases of urinary system: Secondary | ICD-10-CM | POA: Insufficient documentation

## 2012-11-26 DIAGNOSIS — J3489 Other specified disorders of nose and nasal sinuses: Secondary | ICD-10-CM | POA: Insufficient documentation

## 2012-11-26 DIAGNOSIS — Z862 Personal history of diseases of the blood and blood-forming organs and certain disorders involving the immune mechanism: Secondary | ICD-10-CM | POA: Insufficient documentation

## 2012-11-26 DIAGNOSIS — D649 Anemia, unspecified: Secondary | ICD-10-CM | POA: Insufficient documentation

## 2012-11-26 DIAGNOSIS — K219 Gastro-esophageal reflux disease without esophagitis: Secondary | ICD-10-CM | POA: Insufficient documentation

## 2012-11-26 DIAGNOSIS — Z8709 Personal history of other diseases of the respiratory system: Secondary | ICD-10-CM | POA: Insufficient documentation

## 2012-11-26 DIAGNOSIS — Z79899 Other long term (current) drug therapy: Secondary | ICD-10-CM | POA: Insufficient documentation

## 2012-11-26 DIAGNOSIS — Z8719 Personal history of other diseases of the digestive system: Secondary | ICD-10-CM | POA: Insufficient documentation

## 2012-11-26 DIAGNOSIS — Z8639 Personal history of other endocrine, nutritional and metabolic disease: Secondary | ICD-10-CM | POA: Insufficient documentation

## 2012-11-26 DIAGNOSIS — I10 Essential (primary) hypertension: Secondary | ICD-10-CM | POA: Insufficient documentation

## 2012-11-26 DIAGNOSIS — R509 Fever, unspecified: Secondary | ICD-10-CM | POA: Insufficient documentation

## 2012-11-26 DIAGNOSIS — J45909 Unspecified asthma, uncomplicated: Secondary | ICD-10-CM | POA: Insufficient documentation

## 2012-11-26 DIAGNOSIS — Z8659 Personal history of other mental and behavioral disorders: Secondary | ICD-10-CM | POA: Insufficient documentation

## 2012-11-26 DIAGNOSIS — E119 Type 2 diabetes mellitus without complications: Secondary | ICD-10-CM | POA: Insufficient documentation

## 2012-11-26 MED ORDER — ALBUTEROL SULFATE HFA 108 (90 BASE) MCG/ACT IN AERS
INHALATION_SPRAY | RESPIRATORY_TRACT | Status: AC
  Filled 2012-11-26: qty 6.7

## 2012-11-26 MED ORDER — METHYLPREDNISOLONE 4 MG PO KIT: PACK | ORAL | Status: DC

## 2012-11-26 MED ORDER — METHYLPREDNISOLONE SODIUM SUCC 125 MG IJ SOLR
125.0000 mg | Freq: Once | INTRAMUSCULAR | Status: AC
  Administered 2012-11-26: 125 mg via INTRAVENOUS
  Filled 2012-11-26: qty 2

## 2012-11-26 MED ORDER — DOXYCYCLINE HYCLATE 100 MG PO TABS
100.0000 mg | ORAL_TABLET | Freq: Once | ORAL | Status: AC
  Administered 2012-11-26: 100 mg via ORAL
  Filled 2012-11-26: qty 1

## 2012-11-26 MED ORDER — ALBUTEROL SULFATE (5 MG/ML) 0.5% IN NEBU
10.0000 mg | INHALATION_SOLUTION | RESPIRATORY_TRACT | Status: AC
  Administered 2012-11-26: 10 mg via RESPIRATORY_TRACT

## 2012-11-26 MED ORDER — ALBUTEROL SULFATE HFA 108 (90 BASE) MCG/ACT IN AERS: 2.0000 | INHALATION_SPRAY | RESPIRATORY_TRACT | Status: DC | PRN

## 2012-11-26 MED ORDER — DOXYCYCLINE HYCLATE 100 MG PO CAPS: 100.0000 mg | ORAL_CAPSULE | Freq: Two times a day (BID) | ORAL | Status: DC

## 2012-11-26 MED ORDER — SODIUM CHLORIDE 0.9 % IV SOLN
Freq: Once | INTRAVENOUS | Status: AC
Start: 2012-11-26 — End: 2012-11-26
  Administered 2012-11-26: 10 mL via INTRAVENOUS

## 2012-11-26 NOTE — ED Provider Notes (Signed)
History     CSN: [id number]  Arrival date & time 11/26/12  0131   First MD Initiated Contact with Patient 11/26/12 0206      Chief Complaint  Patient presents with  . Shortness of Breath    (Consider location/radiation/quality/duration/timing/severity/associated sxs/prior treatment) HPI This is a 36 year old female with a history of asthma and morbid obesity. She is here with a three-day history of fever, cough and shortness of breath. The symptoms have been moderate to severe at their worst. She has had transient relief with albuterol but not sustained relief. She's had a fever as well as chills. She has had some posttussive emesis but otherwise no nausea, vomiting or diarrhea. She has had nasal congestion and a sore throat. She states her nasal congestion is exacerbated by the fact she has nasal polyps. She was given 2 albuterol and Atrovent neb treatments prior to arrival with some improvement. EMS was unable to obtain IV access.  Past Medical History  Diagnosis Date  . Asthma   . Hypertension   . Chronic anemia   . Abnormal TSH   . Morbid obesity   . GERD (gastroesophageal reflux disease)   . Seasonal allergies   . Nasal polyps   . Diabetes mellitus     type 2  . Chronic bronchitis   . Sleep apnea   . Shortness of breath 12/14/11    "related to how bad my asthma is; sometimes @ rest, lying down, w/exertion"  . Pneumonia   . Blood transfusion   . Inguinal hernia unilateral, non-recurrent     right  . Kidney infection 2008  . Headache   . Anxiety   . Depression   . PTSD (post-traumatic stress disorder)     "related to prior hospitalizations; things that happened when I was in hospital"    Past Surgical History  Procedure Date  . Cervical cerclage 1999; 2002  . Tubal ligation 2002    Family History  Problem Relation Age of Onset  . Diabetes Mother   . Heart disease Father     History  Substance Use Topics  . Smoking status: Never Smoker   . Smokeless  tobacco: Never Used  . Alcohol Use: No     Comment: 12/14/11 "only on birthdays; parties, and such"    OB History    Grav Para Term Preterm Abortions TAB SAB Ect Mult Living                  Review of Systems  All other systems reviewed and are negative.    Allergies  Aspirin; Ibuprofen; Shellfish allergy; Menthol; and Other  Home Medications   Current Outpatient Rx  Name  Route  Sig  Dispense  Refill  . ALBUTEROL SULFATE (2.5 MG/3ML) 0.083% IN NEBU   Nebulization   Take 2.5 mg by nebulization every 4 (four) hours as needed. For asthma or shortness of breath.         . BUDESONIDE-FORMOTEROL FUMARATE 160-4.5 MCG/ACT IN AERO   Inhalation   Inhale 2 puffs into the lungs 2 (two) times daily.         Marland Kitchen FLUTICASONE PROPIONATE 50 MCG/ACT NA SUSP   Nasal   Place 2 sprays into the nose daily.          Marland Kitchen FOLIC ACID 400 MCG PO TABS   Oral   Take 400 mcg by mouth every morning.          . IPRATROPIUM-ALBUTEROL 0.5-2.5 (3) MG/3ML IN SOLN  Nebulization   Take 3 mLs by nebulization every 4 (four) hours as needed. For wheezing or shortness of breath         . IRBESARTAN-HYDROCHLOROTHIAZIDE 300-25 MG PO TABS   Oral   Take 1 tablet by mouth every morning.          Marland Kitchen POTASSIUM 99 MG PO TABS   Oral   Take 4 tablets by mouth every morning.         Marland Kitchen RANITIDINE HCL 150 MG PO CAPS   Oral   Take 600 mg by mouth every morning. For heartburn           BP 148/102  Pulse 134  Temp 100.1 F (37.8 C) (Axillary)  Resp 28  SpO2 95%  Physical Exam General: Well-developed, obese female in no acute distress, sitting at laptop computer; appearance consistent with age of record HENT: normocephalic, atraumatic; nasal congestion Eyes: pupils equal round and reactive to light; extraocular muscles intact Neck: supple Heart: regular rate and rhythm Lungs: Wheezing on expiration with decreased air movement bilaterally Abdomen: soft; obese; nontender Extremities: No  deformity; full range of motion Neurologic: Awake, alert and oriented; motor function intact in all extremities and symmetric; no facial droop Skin: Warm and dry Psychiatric: Normal mood and affect    ED Course  Procedures (including critical care time)     MDM  Nursing notes and vitals signs, including pulse oximetry, reviewed.  Summary of this visit's results, reviewed by myself:  Labs:  No results found for this or any previous visit (from the past 24 hour(s)).  Imaging Studies: Dg Chest 2 View  11/26/2012  *RADIOLOGY REPORT*  Clinical Data: Shortness of breath and wheezing.  CHEST - 2 VIEW  Comparison: Chest radiograph performed 04/30/2012  Findings: The lungs are well-aerated and clear.  There is no evidence of focal opacification, pleural effusion or pneumothorax.  The heart is borderline normal in size; the mediastinal contour is within normal limits.  No acute osseous abnormalities are seen.  IMPRESSION: No acute cardiopulmonary process seen.   Original Report Authenticated By: Tonia Ghent, M.D.    4:01 AM Air movement improved, wheezing nearly abated after one hour albuterol treatment. Patient was also given 125 mg of Solu-Medrol IV. She was noted to have a low-grade fever in the ED. Will start her on doxycycline. She was advised to return should she worsen.         Hanley Seamen, MD 11/26/12 [id number]

## 2012-11-26 NOTE — ED Notes (Signed)
Pt left via taxi and before getting albuterol MDI. Unable to sign E-signature.

## 2012-11-26 NOTE — ED Notes (Signed)
Pt receiving albuterol neb treatment. Actively coughing but states she feels better.

## 2012-11-26 NOTE — ED Notes (Signed)
PER EMS: SOB x 2 days. Frequent episode where she needs hospitalization for Asthma. Needs steroid for tx. Wheezing bilaterally. Violent coughing episode during neb treatment. Pt states she washospitalized in [place] in October for the same sx's. Neb tx x 2 : albuterol and Atrovent Unable to gain access for IV in route.

## 2012-12-20 ENCOUNTER — Emergency Department (HOSPITAL_COMMUNITY)
Admission: EM | Admit: 2012-12-20 | Discharge: 2012-12-20 | Disposition: A | Discharge status: Home / Self Care | Payer: Medicare Other | Attending: Emergency Medicine | Admitting: Emergency Medicine

## 2012-12-20 ENCOUNTER — Encounter (HOSPITAL_COMMUNITY): Payer: Self-pay | Admitting: *Deleted

## 2012-12-20 ENCOUNTER — Emergency Department (HOSPITAL_COMMUNITY): Payer: Medicare Other

## 2012-12-20 DIAGNOSIS — Z87448 Personal history of other diseases of urinary system: Secondary | ICD-10-CM | POA: Insufficient documentation

## 2012-12-20 DIAGNOSIS — K219 Gastro-esophageal reflux disease without esophagitis: Secondary | ICD-10-CM | POA: Insufficient documentation

## 2012-12-20 DIAGNOSIS — D649 Anemia, unspecified: Secondary | ICD-10-CM | POA: Insufficient documentation

## 2012-12-20 DIAGNOSIS — Z79899 Other long term (current) drug therapy: Secondary | ICD-10-CM | POA: Insufficient documentation

## 2012-12-20 DIAGNOSIS — J45901 Unspecified asthma with (acute) exacerbation: Secondary | ICD-10-CM

## 2012-12-20 DIAGNOSIS — Z8659 Personal history of other mental and behavioral disorders: Secondary | ICD-10-CM | POA: Insufficient documentation

## 2012-12-20 DIAGNOSIS — G4730 Sleep apnea, unspecified: Secondary | ICD-10-CM | POA: Insufficient documentation

## 2012-12-20 DIAGNOSIS — I10 Essential (primary) hypertension: Secondary | ICD-10-CM | POA: Insufficient documentation

## 2012-12-20 DIAGNOSIS — Z8701 Personal history of pneumonia (recurrent): Secondary | ICD-10-CM | POA: Insufficient documentation

## 2012-12-20 DIAGNOSIS — Z8719 Personal history of other diseases of the digestive system: Secondary | ICD-10-CM | POA: Insufficient documentation

## 2012-12-20 DIAGNOSIS — J309 Allergic rhinitis, unspecified: Secondary | ICD-10-CM | POA: Insufficient documentation

## 2012-12-20 LAB — BASIC METABOLIC PANEL
BUN: 8 mg/dL (ref 6–23)
CO2: 22 mEq/L (ref 19–32)
Chloride: 100 mEq/L (ref 96–112)
Creatinine, Ser: 0.77 mg/dL (ref 0.50–1.10)
Potassium: 2.6 mEq/L — CL (ref 3.5–5.1)

## 2012-12-20 LAB — CBC
HCT: 29.6 % — ABNORMAL LOW (ref 36.0–46.0)
Hemoglobin: 8.6 g/dL — ABNORMAL LOW (ref 12.0–15.0)
MCV: 64.1 fL — ABNORMAL LOW (ref 78.0–100.0)
RBC: 4.62 MIL/uL (ref 3.87–5.11)
WBC: 7 10*3/uL (ref 4.0–10.5)

## 2012-12-20 MED ORDER — PREDNISONE 10 MG PO TABS: 60.0000 mg | ORAL_TABLET | Freq: Every day | ORAL | Status: DC

## 2012-12-20 MED ORDER — PREDNISONE 20 MG PO TABS
60.0000 mg | ORAL_TABLET | Freq: Once | ORAL | Status: AC
  Administered 2012-12-20: 60 mg via ORAL
  Filled 2012-12-20: qty 3

## 2012-12-20 MED ORDER — ALBUTEROL SULFATE (5 MG/ML) 0.5% IN NEBU
5.0000 mg | INHALATION_SOLUTION | Freq: Once | RESPIRATORY_TRACT | Status: AC
  Administered 2012-12-20: 5 mg via RESPIRATORY_TRACT
  Filled 2012-12-20: qty 1

## 2012-12-20 MED ORDER — IPRATROPIUM BROMIDE 0.02 % IN SOLN
0.5000 mg | Freq: Once | RESPIRATORY_TRACT | Status: AC
  Administered 2012-12-20: 0.5 mg via RESPIRATORY_TRACT
  Filled 2012-12-20: qty 2.5

## 2012-12-20 NOTE — ED Notes (Signed)
Pt in from home by ems. Hx asthma, anxiety. C/o shob, anxiety. Home neb without relief. Two alb/atrovent nebs en route by ems. Expiratory wheezing improving.

## 2012-12-20 NOTE — ED Notes (Signed)
IV team unable to obtain IV access

## 2012-12-20 NOTE — ED Provider Notes (Signed)
History     CSN: [id number]  Arrival date & time 12/20/12  1047   First MD Initiated Contact with Patient 12/20/12 1055      Chief Complaint  Patient presents with  . Shortness of Breath     The history is provided by the patient.   patient reports worsening shortness of breath over the past several days.  Patient tried nebulized breathing treatment at home without improvement in her symptoms.  2 nebulized treatments given by EMS with improvement in her symptoms.  A history of DVT or pulmonary embolism.  Denies chest pain chest tightness.  Her symptoms are mild to moderate in severity and they are improving.  Past Medical History  Diagnosis Date  . Asthma   . Hypertension   . Chronic anemia   . Abnormal TSH   . Morbid obesity   . GERD (gastroesophageal reflux disease)   . Seasonal allergies   . Nasal polyps   . Diabetes mellitus     type 2  . Chronic bronchitis   . Sleep apnea   . Shortness of breath 12/14/11    "related to how bad my asthma is; sometimes @ rest, lying down, w/exertion"  . Pneumonia   . Blood transfusion   . Inguinal hernia unilateral, non-recurrent     right  . Kidney infection 2008  . Headache   . Anxiety   . Depression   . PTSD (post-traumatic stress disorder)     "related to prior hospitalizations; things that happened when I was in hospital"    Past Surgical History  Procedure Date  . Cervical cerclage 1999; 2002  . Tubal ligation 2002    Family History  Problem Relation Age of Onset  . Diabetes Mother   . Heart disease Father     History  Substance Use Topics  . Smoking status: Never Smoker   . Smokeless tobacco: Never Used  . Alcohol Use: No     Comment: 12/14/11 "only on birthdays; parties, and such"    OB History    Grav Para Term Preterm Abortions TAB SAB Ect Mult Living                  Review of Systems  All other systems reviewed and are negative.    Allergies  Aspirin; Ibuprofen; Shellfish allergy; Menthol; and  Other  Home Medications   Current Outpatient Rx  Name  Route  Sig  Dispense  Refill  . ALBUTEROL SULFATE (2.5 MG/3ML) 0.083% IN NEBU   Nebulization   Take 2.5 mg by nebulization every 4 (four) hours as needed. For asthma or shortness of breath.         . BUDESONIDE-FORMOTEROL FUMARATE 160-4.5 MCG/ACT IN AERO   Inhalation   Inhale 2 puffs into the lungs 2 (two) times daily.         Marland Kitchen DIPHENHYDRAMINE HCL 25 MG PO CAPS   Oral   Take 25 mg by mouth daily as needed. For allergies.         Di Kindle SULFATE 325 (65 FE) MG PO TABS   Oral   Take 325 mg by mouth daily with breakfast.         . FLUTICASONE PROPIONATE 50 MCG/ACT NA SUSP   Nasal   Place 2 sprays into the nose daily.          Marland Kitchen FOLIC ACID 400 MCG PO TABS   Oral   Take 400 mcg by mouth every morning.          Marland Kitchen  IPRATROPIUM-ALBUTEROL 0.5-2.5 (3) MG/3ML IN SOLN   Nebulization   Take 3 mLs by nebulization every 4 (four) hours as needed. For wheezing or shortness of breath         . IRBESARTAN-HYDROCHLOROTHIAZIDE 300-25 MG PO TABS   Oral   Take 1 tablet by mouth every morning.          Marland Kitchen VICKS BABYRUB EX   Apply externally   Apply 1 application topically as needed. For congestion.         Marland Kitchen POTASSIUM 99 MG PO TABS   Oral   Take 4 tablets by mouth every morning.         Marland Kitchen RANITIDINE HCL 150 MG PO CAPS   Oral   Take 600 mg by mouth every morning. For heartburn         . PREDNISONE 10 MG PO TABS   Oral   Take 6 tablets (60 mg total) by mouth daily.   30 tablet   0     BP 172/95  Pulse 139  Temp 98.6 F (37 C) (Axillary)  Resp 29  SpO2 97%  Physical Exam  Nursing note and vitals reviewed. Constitutional: She is oriented to person, place, and time. She appears well-developed and well-nourished. No distress.  HENT:  Head: Normocephalic and atraumatic.  Eyes: EOM are normal.  Neck: Normal range of motion.  Cardiovascular: Normal rate, regular rhythm and normal heart sounds.     Pulmonary/Chest: Effort normal. She has wheezes.  Abdominal: Soft. She exhibits no distension. There is no tenderness.  Musculoskeletal: Normal range of motion.  Neurological: She is alert and oriented to person, place, and time.  Skin: Skin is warm and dry.  Psychiatric: She has a normal mood and affect. Judgment normal.    ED Course  Procedures (including critical care time)  Labs Reviewed  CBC - Abnormal; Notable for the following:    Hemoglobin 8.6 (*)     HCT 29.6 (*)     MCV 64.1 (*)     MCH 18.6 (*)     MCHC 29.1 (*)     RDW 17.6 (*)     All other components within normal limits  BASIC METABOLIC PANEL - Abnormal; Notable for the following:    Potassium 2.6 (*)     Glucose, Bld 154 (*)     All other components within normal limits  D-DIMER, QUANTITATIVE   Dg Chest 2 View  12/20/2012  *RADIOLOGY REPORT*  Clinical Data: Shortness of breath, history of asthma and hypertension  CHEST - 2 VIEW  Comparison: 11/26/2012  Findings: The heart size and vascular pattern are normal.  There are mildly increased bilateral perihilar markings with central airway wall thickening.  No pleural effusion or consolidation.  IMPRESSION: Findings most consistent with bronchitis.  No evidence of pneumonia.   Original Report Authenticated By: Esperanza Heir, M.D.    I personally reviewed the imaging tests through PACS system I reviewed available ER/hospitalization records through the EMR   1. Asthma exacerbation      Date: 12/20/2012  Rate: 135  Rhythm: sinus tach  QRS Axis: normal  Intervals: normal  ST/T Wave abnormalities: normal  Conduction Disutrbances: none  Narrative Interpretation:   Old EKG Reviewed:changes noted     MDM  2:10 PM The patient feels much better at this time.  Her wheezing is improved.  Home with steroids.  She has albuterol at home.  Discharge home in good condition.  She understands return to the  ER for new or worsening symptoms        Lyanne Co,  MD 12/20/12 1416

## 2012-12-20 NOTE — ED Notes (Signed)
IV team paged to assess pt for IV start.

## 2012-12-20 NOTE — ED Notes (Signed)
[id number]<JY> Expected date:<BR> Expected time:<BR> Means of arrival:<BR> Comments:<BR> SOB-COPD

## 2012-12-20 NOTE — ED Notes (Signed)
Unable to obtain IV access.  Will have second RN attempt.

## 2012-12-20 NOTE — ED Notes (Signed)
RT called for breathing tx. 

## 2012-12-21 ENCOUNTER — Encounter (HOSPITAL_COMMUNITY): Payer: Self-pay | Admitting: *Deleted

## 2012-12-21 ENCOUNTER — Inpatient Hospital Stay (HOSPITAL_COMMUNITY)
Admission: EM | Admit: 2012-12-21 | Discharge: 2012-12-23 | DRG: 202 | Disposition: A | Discharge status: Home / Self Care | Payer: Medicare Other | Attending: Internal Medicine | Admitting: Internal Medicine

## 2012-12-21 DIAGNOSIS — Z6841 Body Mass Index (BMI) 40.0 and over, adult: Secondary | ICD-10-CM

## 2012-12-21 DIAGNOSIS — J9601 Acute respiratory failure with hypoxia: Secondary | ICD-10-CM

## 2012-12-21 DIAGNOSIS — J45902 Unspecified asthma with status asthmaticus: Secondary | ICD-10-CM

## 2012-12-21 DIAGNOSIS — J45901 Unspecified asthma with (acute) exacerbation: Principal | ICD-10-CM | POA: Diagnosis present

## 2012-12-21 DIAGNOSIS — F411 Generalized anxiety disorder: Secondary | ICD-10-CM | POA: Diagnosis present

## 2012-12-21 DIAGNOSIS — E876 Hypokalemia: Secondary | ICD-10-CM

## 2012-12-21 DIAGNOSIS — E119 Type 2 diabetes mellitus without complications: Secondary | ICD-10-CM | POA: Diagnosis present

## 2012-12-21 DIAGNOSIS — Z79899 Other long term (current) drug therapy: Secondary | ICD-10-CM

## 2012-12-21 DIAGNOSIS — J40 Bronchitis, not specified as acute or chronic: Secondary | ICD-10-CM

## 2012-12-21 DIAGNOSIS — I10 Essential (primary) hypertension: Secondary | ICD-10-CM | POA: Diagnosis present

## 2012-12-21 DIAGNOSIS — K219 Gastro-esophageal reflux disease without esophagitis: Secondary | ICD-10-CM | POA: Diagnosis present

## 2012-12-21 DIAGNOSIS — D649 Anemia, unspecified: Secondary | ICD-10-CM

## 2012-12-21 DIAGNOSIS — Z9119 Patient's noncompliance with other medical treatment and regimen: Secondary | ICD-10-CM

## 2012-12-21 DIAGNOSIS — Z91199 Patient's noncompliance with other medical treatment and regimen due to unspecified reason: Secondary | ICD-10-CM

## 2012-12-21 DIAGNOSIS — R000 Tachycardia, unspecified: Secondary | ICD-10-CM

## 2012-12-21 DIAGNOSIS — D509 Iron deficiency anemia, unspecified: Secondary | ICD-10-CM

## 2012-12-21 DIAGNOSIS — F329 Major depressive disorder, single episode, unspecified: Secondary | ICD-10-CM | POA: Diagnosis present

## 2012-12-21 DIAGNOSIS — F3289 Other specified depressive episodes: Secondary | ICD-10-CM | POA: Diagnosis present

## 2012-12-21 DIAGNOSIS — F4310 Post-traumatic stress disorder, unspecified: Secondary | ICD-10-CM | POA: Diagnosis present

## 2012-12-21 DIAGNOSIS — G4730 Sleep apnea, unspecified: Secondary | ICD-10-CM | POA: Diagnosis present

## 2012-12-21 DIAGNOSIS — IMO0002 Reserved for concepts with insufficient information to code with codable children: Secondary | ICD-10-CM

## 2012-12-21 LAB — POCT I-STAT, CHEM 8
Calcium, Ion: 1.13 mmol/L (ref 1.12–1.23)
Creatinine, Ser: 0.7 mg/dL (ref 0.50–1.10)
Glucose, Bld: 264 mg/dL — ABNORMAL HIGH (ref 70–99)
Hemoglobin: 11.2 g/dL — ABNORMAL LOW (ref 12.0–15.0)
TCO2: 20 mmol/L (ref 0–100)

## 2012-12-21 MED ORDER — METHYLPREDNISOLONE SODIUM SUCC 125 MG IJ SOLR
60.0000 mg | Freq: Four times a day (QID) | INTRAMUSCULAR | Status: DC
  Administered 2012-12-22 – 2012-12-23 (×6): 60 mg via INTRAVENOUS
  Filled 2012-12-21: qty 0.96
  Filled 2012-12-21: qty 2
  Filled 2012-12-21 (×7): qty 0.96
  Filled 2012-12-21: qty 2

## 2012-12-21 MED ORDER — MAGNESIUM SULFATE 40 MG/ML IJ SOLN
2.0000 g | Freq: Once | INTRAMUSCULAR | Status: AC
  Administered 2012-12-22: 2 g via INTRAVENOUS
  Filled 2012-12-21: qty 50

## 2012-12-21 MED ORDER — IPRATROPIUM BROMIDE 0.02 % IN SOLN
0.5000 mg | Freq: Once | RESPIRATORY_TRACT | Status: AC
  Administered 2012-12-21: 0.5 mg via RESPIRATORY_TRACT
  Filled 2012-12-21: qty 2.5

## 2012-12-21 MED ORDER — ALBUTEROL SULFATE (5 MG/ML) 0.5% IN NEBU
2.5000 mg | INHALATION_SOLUTION | Freq: Once | RESPIRATORY_TRACT | Status: AC
  Administered 2012-12-21: 2.5 mg via RESPIRATORY_TRACT
  Filled 2012-12-21: qty 0.5

## 2012-12-21 NOTE — ED Provider Notes (Signed)
History     CSN: [id number]  Arrival date & time 12/21/12  2206   First MD Initiated Contact with Patient 12/21/12 2242      Chief Complaint  Patient presents with  . Asthma    (Consider location/radiation/quality/duration/timing/severity/associated sxs/prior treatment) HPI Comments: [name] [name] is a 37 year old female patient with a history of asthma including one exacerbation leading to her being intubated in 2011 , morbid obesity, nasal polyps and chronic bronchitis who presents to the emergency department complaining of shortness of breath.  When she's sitting down she feels okay but when she starts walking around she feels short of breath.  Yesterday she was seen in the emergency department for the same complaint and denies any improvement in her symptoms after discharge.  She was also prescribed prednisone yesterday and states she has been taking it as prescribed.  Today she did two breathing treatments and then called EMS who transported her here and gave her one more breathing treatment.  Associated symptoms include a productive cough.  She denies chest pain, nausea, vomiting, lightheadedness, dizziness, syncope, fever or chills.  Patient is a 37 y.o. female presenting with asthma.  Asthma Associated symptoms include coughing. Pertinent negatives include no chest pain, chills, diaphoresis, fatigue, fever, nausea or vomiting.    Past Medical History  Diagnosis Date  . Asthma   . Hypertension   . Chronic anemia   . Abnormal TSH   . Morbid obesity   . GERD (gastroesophageal reflux disease)   . Seasonal allergies   . Nasal polyps   . Diabetes mellitus     type 2  . Chronic bronchitis   . Sleep apnea   . Shortness of breath 12/14/11    "related to how bad my asthma is; sometimes @ rest, lying down, w/exertion"  . Pneumonia   . Blood transfusion   . Inguinal hernia unilateral, non-recurrent     right  . Kidney infection 2008  . Headache   . Anxiety   . Depression     . PTSD (post-traumatic stress disorder)     "related to prior hospitalizations; things that happened when I was in hospital"    Past Surgical History  Procedure Date  . Cervical cerclage 1999; 2002  . Tubal ligation 2002    Family History  Problem Relation Age of Onset  . Diabetes Mother   . Heart disease Father     History  Substance Use Topics  . Smoking status: Never Smoker   . Smokeless tobacco: Never Used  . Alcohol Use: No     Comment: 12/14/11 "only on birthdays; parties, and such"    OB History    Grav Para Term Preterm Abortions TAB SAB Ect Mult Living                  Review of Systems  Constitutional: Negative for fever, chills, diaphoresis and fatigue.  Respiratory: Positive for cough, chest tightness, shortness of breath and wheezing. Negative for apnea, choking and stridor.   Cardiovascular: Negative for chest pain.  Gastrointestinal: Negative for nausea and vomiting.  Neurological: Negative for dizziness, syncope and light-headedness.  All other systems reviewed and are negative.    Allergies  Aspirin; Ibuprofen; Shellfish allergy; Menthol; and Other  Home Medications   Current Outpatient Rx  Name  Route  Sig  Dispense  Refill  . ALBUTEROL SULFATE (2.5 MG/3ML) 0.083% IN NEBU   Nebulization   Take 2.5 mg by nebulization every 4 (four) hours as  needed. For asthma or shortness of breath.         . BUDESONIDE-FORMOTEROL FUMARATE 160-4.5 MCG/ACT IN AERO   Inhalation   Inhale 2 puffs into the lungs 2 (two) times daily.         Marland Kitchen DIPHENHYDRAMINE HCL 25 MG PO CAPS   Oral   Take 25 mg by mouth daily as needed. For allergies.         Di Kindle SULFATE 325 (65 FE) MG PO TABS   Oral   Take 325 mg by mouth daily with breakfast.         . FLUTICASONE PROPIONATE 50 MCG/ACT NA SUSP   Nasal   Place 2 sprays into the nose daily.          Marland Kitchen FOLIC ACID 400 MCG PO TABS   Oral   Take 400 mcg by mouth every morning.          .  IPRATROPIUM-ALBUTEROL 0.5-2.5 (3) MG/3ML IN SOLN   Nebulization   Take 3 mLs by nebulization every 4 (four) hours as needed. For wheezing or shortness of breath         . IRBESARTAN-HYDROCHLOROTHIAZIDE 300-25 MG PO TABS   Oral   Take 1 tablet by mouth every morning.          Marland Kitchen VICKS BABYRUB EX   Apply externally   Apply 1 application topically as needed. For congestion.         Marland Kitchen METFORMIN HCL 500 MG PO TABS   Oral   Take 500 mg by mouth 2 (two) times daily with a meal. Take with prednisone to control blood glucose         . POTASSIUM 99 MG PO TABS   Oral   Take 4 tablets by mouth every morning.         Marland Kitchen PREDNISONE 10 MG PO TABS   Oral   Take 60 mg by mouth every morning.         Marland Kitchen RANITIDINE HCL 150 MG PO CAPS   Oral   Take 600 mg by mouth every morning. For heartburn           BP 203/121  Pulse 107  Resp 28  SpO2 98%  Physical Exam  Constitutional: She is oriented to person, place, and time. She appears well-developed and well-nourished. No distress.  HENT:  Head: Normocephalic.  Neck: No JVD present.  Cardiovascular: Normal rate, regular rhythm, normal heart sounds and intact distal pulses.   Pulmonary/Chest: No stridor. Tachypnea noted. No apnea. She has wheezes. She has no rhonchi. She has no rales. She exhibits no tenderness.  Neurological: She is alert and oriented to person, place, and time.  Skin: Skin is warm and dry. She is not diaphoretic.    ED Course  Procedures (including critical care time)  Labs Reviewed  CBC - Abnormal; Notable for the following:    WBC 11.7 (*)  WHITE COUNT CONFIRMED ON SMEAR   Hemoglobin 9.1 (*)     HCT 30.5 (*)     MCV 63.4 (*)     MCH 18.9 (*)     MCHC 29.8 (*)     RDW 17.8 (*)     Platelets 436 (*)     All other components within normal limits  URINALYSIS, ROUTINE W REFLEX MICROSCOPIC - Abnormal; Notable for the following:    APPearance HAZY (*)     Specific Gravity, Urine 1.041 (*)     Glucose, UA  >  1000 (*)     Bilirubin Urine SMALL (*)     Ketones, ur TRACE (*)     Protein, ur 100 (*)     All other components within normal limits  URINE RAPID DRUG SCREEN (HOSP PERFORMED) - Abnormal; Notable for the following:    Benzodiazepines POSITIVE (*)     Amphetamines POSITIVE (*)     All other components within normal limits  POCT I-STAT, CHEM 8 - Abnormal; Notable for the following:    Potassium 3.3 (*)     Glucose, Bld 264 (*)     Hemoglobin 11.2 (*)     HCT 33.0 (*)     All other components within normal limits  URINE MICROSCOPIC-ADD ON - Abnormal; Notable for the following:    Squamous Epithelial / LPF FEW (*)     All other components within normal limits  CBC  BASIC METABOLIC PANEL  IGE   Dg Chest 2 View  12/20/2012  *RADIOLOGY REPORT*  Clinical Data: Shortness of breath, history of asthma and hypertension  CHEST - 2 VIEW  Comparison: 11/26/2012  Findings: The heart size and vascular pattern are normal.  There are mildly increased bilateral perihilar markings with central airway wall thickening.  No pleural effusion or consolidation.  IMPRESSION: Findings most consistent with bronchitis.  No evidence of pneumonia.   Original Report Authenticated By: Esperanza Heir, M.D.      1. Acute asthma exacerbation   2. HTN (hypertension), malignant   3. Type 2 diabetes mellitus       MDM  Asthma failed outpatient therapy        Arman Filter, NP 12/22/12 0345  Arman Filter, NP 12/22/12 0345

## 2012-12-21 NOTE — ED Notes (Signed)
Pt arrives from home by [facility] with c/o SOB/asthma. Pt recently seen and evaluated for same. Pt reports no relief with prescribed medications. 5mg  albuterol and atrovent given by EMS pta.

## 2012-12-21 NOTE — ED Notes (Signed)
Verbal report given to Kelly T. RN.  

## 2012-12-22 DIAGNOSIS — J45901 Unspecified asthma with (acute) exacerbation: Principal | ICD-10-CM

## 2012-12-22 DIAGNOSIS — I10 Essential (primary) hypertension: Secondary | ICD-10-CM

## 2012-12-22 DIAGNOSIS — E119 Type 2 diabetes mellitus without complications: Secondary | ICD-10-CM

## 2012-12-22 LAB — URINALYSIS, ROUTINE W REFLEX MICROSCOPIC
Glucose, UA: >1000 mg/dL — AB
Hgb urine dipstick: NEGATIVE
Leukocytes, UA: NEGATIVE
pH: 6 (ref 5.0–8.0)

## 2012-12-22 LAB — BASIC METABOLIC PANEL
BUN: 12 mg/dL (ref 6–23)
CO2: 21 mEq/L (ref 19–32)
Calcium: 8.7 mg/dL (ref 8.4–10.5)
Glucose, Bld: 296 mg/dL — ABNORMAL HIGH (ref 70–99)
Sodium: 136 mEq/L (ref 135–145)

## 2012-12-22 LAB — GLUCOSE, CAPILLARY: Glucose-Capillary: 271 mg/dL — ABNORMAL HIGH (ref 70–99)

## 2012-12-22 LAB — IGE: IgE (Immunoglobulin E), Serum: 100.2 IU/mL (ref 0.0–180.0)

## 2012-12-22 LAB — VITAMIN B12: Vitamin B-12: 323 pg/mL (ref 211–911)

## 2012-12-22 LAB — FERRITIN: Ferritin: 4 ng/mL — ABNORMAL LOW (ref 10–291)

## 2012-12-22 LAB — MRSA PCR SCREENING: MRSA by PCR: POSITIVE — AB

## 2012-12-22 LAB — RETICULOCYTES
RBC.: 4.41 MIL/uL (ref 3.87–5.11)
Retic Ct Pct: 1.7 % (ref 0.4–3.1)

## 2012-12-22 LAB — RAPID URINE DRUG SCREEN, HOSP PERFORMED
Amphetamines: POSITIVE — AB
Benzodiazepines: POSITIVE — AB
Cocaine: NOT DETECTED
Opiates: NOT DETECTED
Tetrahydrocannabinol: NOT DETECTED

## 2012-12-22 LAB — CBC
HCT: 29.3 % — ABNORMAL LOW (ref 36.0–46.0)
HCT: 30.5 % — ABNORMAL LOW (ref 36.0–46.0)
Hemoglobin: 8.4 g/dL — ABNORMAL LOW (ref 12.0–15.0)
Hemoglobin: 9.1 g/dL — ABNORMAL LOW (ref 12.0–15.0)
MCH: 18.2 pg — ABNORMAL LOW (ref 26.0–34.0)
MCV: 63.4 fL — ABNORMAL LOW (ref 78.0–100.0)
RBC: 4.62 MIL/uL (ref 3.87–5.11)
RBC: 4.81 MIL/uL (ref 3.87–5.11)

## 2012-12-22 LAB — IRON AND TIBC
Saturation Ratios: 6 % — ABNORMAL LOW (ref 20–55)
TIBC: 466 ug/dL (ref 250–470)

## 2012-12-22 LAB — URINE MICROSCOPIC-ADD ON

## 2012-12-22 MED ORDER — ALBUTEROL SULFATE (5 MG/ML) 0.5% IN NEBU
2.5000 mg | INHALATION_SOLUTION | Freq: Four times a day (QID) | RESPIRATORY_TRACT | Status: DC
  Administered 2012-12-22 – 2012-12-23 (×4): 2.5 mg via RESPIRATORY_TRACT
  Filled 2012-12-22 (×5): qty 0.5

## 2012-12-22 MED ORDER — IRBESARTAN 300 MG PO TABS
300.0000 mg | ORAL_TABLET | Freq: Every day | ORAL | Status: DC
  Administered 2012-12-22 – 2012-12-23 (×2): 300 mg via ORAL
  Filled 2012-12-22 (×2): qty 1

## 2012-12-22 MED ORDER — FERROUS SULFATE 325 (65 FE) MG PO TABS
325.0000 mg | ORAL_TABLET | Freq: Every day | ORAL | Status: DC
  Administered 2012-12-22 – 2012-12-23 (×2): 325 mg via ORAL
  Filled 2012-12-22 (×3): qty 1

## 2012-12-22 MED ORDER — FOLIC ACID 0.5 MG HALF TAB
0.5000 mg | ORAL_TABLET | Freq: Every morning | ORAL | Status: DC
  Administered 2012-12-22 – 2012-12-23 (×2): 0.5 mg via ORAL
  Filled 2012-12-22 (×2): qty 1

## 2012-12-22 MED ORDER — HYDRALAZINE HCL 20 MG/ML IJ SOLN
10.0000 mg | Freq: Once | INTRAMUSCULAR | Status: AC
  Administered 2012-12-22: 10 mg via INTRAVENOUS
  Filled 2012-12-22: qty 0.5

## 2012-12-22 MED ORDER — DIPHENHYDRAMINE HCL 25 MG PO CAPS: 25.0000 mg | ORAL_CAPSULE | Freq: Every day | ORAL | Status: DC | PRN

## 2012-12-22 MED ORDER — METFORMIN HCL 500 MG PO TABS
500.0000 mg | ORAL_TABLET | Freq: Two times a day (BID) | ORAL | Status: DC
  Administered 2012-12-22 – 2012-12-23 (×3): 500 mg via ORAL
  Filled 2012-12-22 (×5): qty 1

## 2012-12-22 MED ORDER — ALBUTEROL SULFATE (5 MG/ML) 0.5% IN NEBU
2.5000 mg | INHALATION_SOLUTION | RESPIRATORY_TRACT | Status: DC | PRN
  Administered 2012-12-22: 2.5 mg via RESPIRATORY_TRACT
  Filled 2012-12-22 (×3): qty 0.5

## 2012-12-22 MED ORDER — IRBESARTAN-HYDROCHLOROTHIAZIDE 300-25 MG PO TABS: 1.0000 | ORAL_TABLET | Freq: Every morning | ORAL | Status: DC

## 2012-12-22 MED ORDER — POTASSIUM 99 MG PO TABS: 4.0000 | ORAL_TABLET | Freq: Every morning | ORAL | Status: DC

## 2012-12-22 MED ORDER — CHLORHEXIDINE GLUCONATE CLOTH 2 % EX PADS: 6.0000 | MEDICATED_PAD | Freq: Every day | CUTANEOUS | Status: DC

## 2012-12-22 MED ORDER — FLUTICASONE PROPIONATE 50 MCG/ACT NA SUSP
2.0000 | Freq: Every day | NASAL | Status: DC
  Administered 2012-12-22 – 2012-12-23 (×2): 2 via NASAL
  Filled 2012-12-22: qty 16

## 2012-12-22 MED ORDER — HYDROCHLOROTHIAZIDE 25 MG PO TABS
25.0000 mg | ORAL_TABLET | Freq: Every day | ORAL | Status: DC
  Administered 2012-12-22 – 2012-12-23 (×3): 25 mg via ORAL
  Filled 2012-12-22 (×2): qty 1

## 2012-12-22 MED ORDER — BUDESONIDE-FORMOTEROL FUMARATE 160-4.5 MCG/ACT IN AERO
2.0000 | INHALATION_SPRAY | Freq: Two times a day (BID) | RESPIRATORY_TRACT | Status: DC
  Administered 2012-12-22 – 2012-12-23 (×4): 2 via RESPIRATORY_TRACT
  Filled 2012-12-22: qty 6

## 2012-12-22 MED ORDER — SODIUM CHLORIDE 0.9 % IJ SOLN
3.0000 mL | Freq: Two times a day (BID) | INTRAMUSCULAR | Status: DC
  Administered 2012-12-22 (×2): 3 mL via INTRAVENOUS

## 2012-12-22 MED ORDER — MUPIROCIN 2 % EX OINT
1.0000 "application " | TOPICAL_OINTMENT | Freq: Two times a day (BID) | CUTANEOUS | Status: DC
  Administered 2012-12-22 – 2012-12-23 (×3): 1 via NASAL
  Filled 2012-12-22: qty 22

## 2012-12-22 MED ORDER — HYDROCHLOROTHIAZIDE 25 MG PO TABS
25.0000 mg | ORAL_TABLET | Freq: Every day | ORAL | Status: DC
  Filled 2012-12-22: qty 1

## 2012-12-22 MED ORDER — INFLUENZA VIRUS VACC SPLIT PF IM SUSP
0.5000 mL | INTRAMUSCULAR | Status: AC
  Administered 2012-12-23: 0.5 mL via INTRAMUSCULAR
  Filled 2012-12-22 (×2): qty 0.5

## 2012-12-22 MED ORDER — POTASSIUM CHLORIDE CRYS ER 20 MEQ PO TBCR
40.0000 meq | EXTENDED_RELEASE_TABLET | Freq: Every day | ORAL | Status: DC
  Administered 2012-12-22 – 2012-12-23 (×2): 40 meq via ORAL
  Filled 2012-12-22 (×3): qty 2

## 2012-12-22 MED ORDER — POTASSIUM CHLORIDE CRYS ER 10 MEQ PO TBCR
10.0000 meq | EXTENDED_RELEASE_TABLET | Freq: Every day | ORAL | Status: DC
  Administered 2012-12-22: 10 meq via ORAL
  Filled 2012-12-22: qty 1

## 2012-12-22 MED ORDER — FAMOTIDINE 20 MG PO TABS
20.0000 mg | ORAL_TABLET | Freq: Two times a day (BID) | ORAL | Status: DC
  Administered 2012-12-22 – 2012-12-23 (×4): 20 mg via ORAL
  Filled 2012-12-22 (×5): qty 1

## 2012-12-22 MED ORDER — ENOXAPARIN SODIUM 40 MG/0.4ML ~~LOC~~ SOLN
40.0000 mg | SUBCUTANEOUS | Status: DC
  Administered 2012-12-22: 40 mg via SUBCUTANEOUS
  Filled 2012-12-22 (×2): qty 0.4

## 2012-12-22 MED ORDER — INSULIN ASPART 100 UNIT/ML ~~LOC~~ SOLN
0.0000 [IU] | Freq: Three times a day (TID) | SUBCUTANEOUS | Status: DC
  Administered 2012-12-22 (×2): 7 [IU] via SUBCUTANEOUS
  Administered 2012-12-23: 3 [IU] via SUBCUTANEOUS

## 2012-12-22 NOTE — ED Notes (Signed)
Notified RN,[place], pt. Blood pressure elevated 218/115.

## 2012-12-22 NOTE — H&P (Signed)
[facility] History and Physical  [name] [name] XLK:[id number] DOB: [birth year] DOA: 12/21/2012  Referring physician: ED PCP: No Pcp  Specialists: None  Chief Complaint: Asthma attack  HPI: [name] [name] is a 37 y.o. female who presents for the 2nd day in a row with unrelenting asthma symptoms, she has failed outpatient prednisone therapy started yesterday to try and get her asthma under control.  She is uncertain of the triggers for her asthma and takes all home meds as prescribed but her asthma just isnt improving at this time.  Her symptoms were improved in the ED with albuterol, magnesium, and solumedrol, and hospitalist has been asked to admit.  Review of Systems: 12 systems reviewed and otherwise negative.  Past Medical History  Diagnosis Date  . Asthma   . Hypertension   . Chronic anemia   . Abnormal TSH   . Morbid obesity   . GERD (gastroesophageal reflux disease)   . Seasonal allergies   . Nasal polyps   . Diabetes mellitus     type 2  . Chronic bronchitis   . Sleep apnea   . Shortness of breath 12/14/11    "related to how bad my asthma is; sometimes @ rest, lying down, w/exertion"  . Pneumonia   . Blood transfusion   . Inguinal hernia unilateral, non-recurrent     right  . Kidney infection 2008  . Headache   . Anxiety   . Depression   . PTSD (post-traumatic stress disorder)     "related to prior hospitalizations; things that happened when I was in hospital"   Past Surgical History  Procedure Date  . Cervical cerclage 1999; 2002  . Tubal ligation 2002   Social History:  reports that she has never smoked. She has never used smokeless tobacco. She reports that she does not drink alcohol or use illicit drugs.   Allergies  Allergen Reactions  . Aspirin Anaphylaxis  . Ibuprofen Anaphylaxis  . Shellfish Allergy Hives and Swelling    Facial and oral swelling  . Menthol Swelling    Tongue swelling  . Other Other (See Comments)    Plastic  thermometer covers cause sloughing off of skin on tongue and inside mouth    Family History  Problem Relation Age of Onset  . Diabetes Mother   . Heart disease Father     Prior to Admission medications   Medication Sig Start Date End Date Taking? Authorizing Provider  albuterol (PROVENTIL) (2.5 MG/3ML) 0.083% nebulizer solution Take 2.5 mg by nebulization every 4 (four) hours as needed. For asthma or shortness of breath.   Yes Historical Provider, MD  budesonide-formoterol (SYMBICORT) 160-4.5 MCG/ACT inhaler Inhale 2 puffs into the lungs 2 (two) times daily.   Yes Historical Provider, MD  diphenhydrAMINE (BENADRYL) 25 mg capsule Take 25 mg by mouth daily as needed. For allergies.   Yes Historical Provider, MD  ferrous sulfate 325 (65 FE) MG tablet Take 325 mg by mouth daily with breakfast.   Yes Historical Provider, MD  fluticasone (FLONASE) 50 MCG/ACT nasal spray Place 2 sprays into the nose daily.    Yes Historical Provider, MD  folic acid (FOLVITE) 400 MCG tablet Take 400 mcg by mouth every morning.    Yes Historical Provider, MD  ipratropium-albuterol (DUONEB) 0.5-2.5 (3) MG/3ML SOLN Take 3 mLs by nebulization every 4 (four) hours as needed. For wheezing or shortness of breath 09/01/12  Yes Heather Zenaida Niece Wingen, PA-C  irbesartan-hydrochlorothiazide (AVALIDE) 300-25 MG per tablet Take 1 tablet by mouth  every morning.    Yes Historical Provider, MD  Liniments (VICKS BABYRUB EX) Apply 1 application topically as needed. For congestion.   Yes Historical Provider, MD  Potassium 99 MG TABS Take 4 tablets by mouth every morning.   Yes Historical Provider, MD  predniSONE (DELTASONE) 10 MG tablet Take 60 mg by mouth every morning. 12/20/12  Yes Lyanne Co, MD  ranitidine (ZANTAC) 150 MG capsule Take 600 mg by mouth every morning. For heartburn   Yes Historical Provider, MD   Physical Exam: Filed Vitals:   12/21/12 2219 12/21/12 2245 12/21/12 2351 12/22/12 0037  BP: 180/112   171/106  Pulse: 135  124  119  Resp: 28 27  17   SpO2: 99% 96% 99% 100%    General:  NAD, resting comfortably in bed Eyes: PEERLA EOMI ENT: mucous membranes moist Neck: supple w/o JVD Cardiovascular: RRR w/o MRG Respiratory: wheezes B especially in the lung bases Abdomen: soft, nt, nd, bs+ Skin: no rash nor lesion Musculoskeletal: MAE, full ROM all 4 extremities Psychiatric: normal tone and affect Neurologic: AAOx3, grossly non-focal   Labs on Admission:  Basic Metabolic Panel:  Lab 12/21/12 1610 12/20/12 1240  NA 143 135  K 3.3* 2.6*  CL 109 100  CO2 -- 22  GLUCOSE 264* 154*  BUN 11 8  CREATININE 0.70 0.77  CALCIUM -- 8.4  MG -- --  PHOS -- --   Liver Function Tests: No results found for this basename: AST:5,ALT:5,ALKPHOS:5,BILITOT:5,PROT:5,ALBUMIN:5 in the last 168 hours No results found for this basename: LIPASE:5,AMYLASE:5 in the last 168 hours No results found for this basename: AMMONIA:5 in the last 168 hours CBC:  Lab 12/21/12 2345 12/21/12 2335 12/20/12 1240  WBC -- 11.7* 7.0  NEUTROABS -- -- --  HGB 11.2* 9.1* 8.6*  HCT 33.0* 30.5* 29.6*  MCV -- 63.4* 64.1*  PLT -- 436* 326   Cardiac Enzymes: No results found for this basename: CKTOTAL:5,CKMB:5,CKMBINDEX:5,TROPONINI:5 in the last 168 hours  BNP (last 3 results) No results found for this basename: PROBNP:3 in the last 8760 hours CBG: No results found for this basename: GLUCAP:5 in the last 168 hours  Radiological Exams on Admission: Dg Chest 2 View  12/20/2012  *RADIOLOGY REPORT*  Clinical Data: Shortness of breath, history of asthma and hypertension  CHEST - 2 VIEW  Comparison: 11/26/2012  Findings: The heart size and vascular pattern are normal.  There are mildly increased bilateral perihilar markings with central airway wall thickening.  No pleural effusion or consolidation.  IMPRESSION: Findings most consistent with bronchitis.  No evidence of pneumonia.   Original Report Authenticated By: Esperanza Heir, M.D.      EKG: Independently reviewed.  Assessment/Plan Active Problems:  HTN (hypertension), malignant  Type 2 diabetes mellitus  Acute asthma exacerbation   1. Acute asthma exacerbation - continue IV solumedrol and admit until symptoms are under better control, probably needs to talk to a pulmonologist since this is 6th hospital admission in past 6 months and asthma is still out of control, may be candidate for Xolair or other more aggressive therapy.  Ordering IgE level as well. 2. HTN - continue home meds 3. DM2 - CBG checks AC/HS, patient makes mention of being on metformin but dont see this on med rec, will have pharmacy confirm and if so continue her home metformin during inpatient stay.  Code Status: Full Code (must indicate code status--if unknown or must be presumed, indicate so) Family Communication: No family in room (indicate person spoken with,  if applicable, with phone number if by telephone) Disposition Plan: Admit to obs (indicate anticipated LOS)  Time spent: 70 min  Chane Cowden M. [facility] Pager [phone]  If 7PM-7AM, please contact night-coverage [email or web address] Password [email or web address] 12/22/2012, 1:16 AM

## 2012-12-22 NOTE — Progress Notes (Addendum)
Inpatient Diabetes Program Recommendations  AACE/ADA: New Consensus Statement on Inpatient Glycemic Control (2013)  Target Ranges:  Prepandial:   less than 140 mg/dL      Peak postprandial:   less than 180 mg/dL (1-2 hours)      Critically ill patients:  140 - 180 mg/dL   Reason for Visit: Note history of diabetes.  According to medication reconciliation, patient only takes Metformin when she is on steroids.  A1C in April of 2013 was 7.4%.  Recommend rechecking A1C to determine past 2-3 month glucose control.  Consider increasing correction to moderate while on steroids in hospital and consider adding Novolog meal coverage if post prandial CBG's remain greater than 200 mg/dL.       Note: Will follow.   1600 Spoke briefly to patient regarding home diabetes management.  She does not have a PCP and usually goes to Urgent care for her Asthma and diabetes.  She states that her CBG's are okay unless she is on steroids.  Her mother also has diabetes and has had amputations.  Explained importance of PCP for chronic disease management.  Talked to case manager who will assist her in obtaining PCP.

## 2012-12-22 NOTE — ED Notes (Signed)
Discussed BP with [facility] mid-level. Informed to give BP IV meds, but if it doesn't bring BP down pt can be taken to step down.

## 2012-12-22 NOTE — ED Notes (Signed)
Report was given to 5 east nurse via [facility], Charity fundraiser. Informed 5 east nurse that pt BP has dropped and should be expecting her after shift change.

## 2012-12-22 NOTE — Progress Notes (Signed)
Pt provided with phone # for [facility] to establish care for both her Asthma and Diabetes. She will call for appt.

## 2012-12-22 NOTE — ED Provider Notes (Signed)
Medical screening examination/treatment/procedure(s) were performed by non-physician practitioner and as supervising physician I was immediately available for consultation/collaboration.  Jones Skene, M.D.      Jones Skene, MD 12/22/12 [phone]

## 2012-12-22 NOTE — Progress Notes (Addendum)
Pt seen and examined at bedside. Blood work and vital signs reviewed, stable. Add scheduled nebulizer given wheezing on exam this AM. CBC and BMP in AM. Plan on tapering steroids as possible based on clinical response. Supplement potassium and obtain anemia panel.  Debbora Presto, MD  [facility] Pager [phone]  If 7PM-7AM, please contact night-coverage [email or web address] Password [email or web address]

## 2012-12-23 DIAGNOSIS — J9600 Acute respiratory failure, unspecified whether with hypoxia or hypercapnia: Secondary | ICD-10-CM

## 2012-12-23 DIAGNOSIS — J45902 Unspecified asthma with status asthmaticus: Secondary | ICD-10-CM

## 2012-12-23 DIAGNOSIS — D649 Anemia, unspecified: Secondary | ICD-10-CM

## 2012-12-23 LAB — CBC
HCT: 27.5 % — ABNORMAL LOW (ref 36.0–46.0)
Hemoglobin: 7.9 g/dL — ABNORMAL LOW (ref 12.0–15.0)
MCH: 18.2 pg — ABNORMAL LOW (ref 26.0–34.0)
MCHC: 28.7 g/dL — ABNORMAL LOW (ref 30.0–36.0)
MCV: 63.5 fL — ABNORMAL LOW (ref 78.0–100.0)
RBC: 4.33 MIL/uL (ref 3.87–5.11)

## 2012-12-23 LAB — BASIC METABOLIC PANEL
BUN: 14 mg/dL (ref 6–23)
CO2: 24 mEq/L (ref 19–32)
Calcium: 8.6 mg/dL (ref 8.4–10.5)
GFR calc non Af Amer: >90 mL/min (ref >90)
Glucose, Bld: 248 mg/dL — ABNORMAL HIGH (ref 70–99)

## 2012-12-23 MED ORDER — ALBUTEROL SULFATE (2.5 MG/3ML) 0.083% IN NEBU: 2.5000 mg | INHALATION_SOLUTION | RESPIRATORY_TRACT | Status: DC | PRN

## 2012-12-23 MED ORDER — PREDNISONE 10 MG PO TABS: ORAL_TABLET | ORAL | Status: DC

## 2012-12-23 NOTE — Progress Notes (Signed)
Went over discharge instructions with patient.  Stressed to patient the importance of taking her medications as prescribed including the Prednisone taper and that patient needs to establish with a primary MD.  Patient verbalized understanding.

## 2012-12-23 NOTE — Discharge Summary (Signed)
Physician Discharge Summary  [name] [name] ZOX:[id number] DOB: 1976-04-28 DOA: 12/21/2012  PCP: No Pcp  Admit date: 12/21/2012 Discharge date: 12/23/2012  Recommendations for Outpatient Follow-up:  1. Pt will need to follow up with PCP in 2-3 weeks post discharge 2. Please obtain BMP to evaluate electrolytes and kidney function 3. Please also check CBC to evaluate Hg and Hct levels 4. Pt discharged on prednisone taper pack  Discharge Diagnoses: Acute asthma exacerbation  Active Problems:  HTN (hypertension), malignant  Type 2 diabetes mellitus  Acute asthma exacerbation  Discharge Condition: Stable  Diet recommendation: Heart healthy diet discussed in details   History of present illness:  Pt is  37 y.o. female who presents for the 2nd day in a row with unrelenting asthma symptoms, she has failed outpatient prednisone therapy started one day prior to admission. Pt denies fevers, chills, chest pain and explains that she has not had any other recent episodes of asthma exacerbations.  Her symptoms were improved in the ED with albuterol, magnesium, and solumedrol, and hospitalist has been asked to admit.  Hospital Course:  Active Problems:  HTN (hypertension), malignant - secondary to medical non compliance in the setting of morbid obesity - pt verbalized understanding after length discussion on importance of compliance   Type 2 diabetes mellitus - pt also reports non compliance with diabetic medications - discussed at length as well   Acute asthma exacerbation - advised pt to avoid triggers such as smoking and the smoky environments, exposure to carpets, cold - prednisone taper pack prescribed - pt insisting on leaving today as she feels better but wheezing on exam noted - discussed need to stay additional day but pt wants to go home today - I provided pt with contacts if needed  Procedures/Studies: Dg Chest 2 View 12/20/2012   Findings most consistent with bronchitis.  No  evidence of pneumonia.     Consultations:  None  Antibiotics:  None  Discharge Exam: Filed Vitals:   12/23/12 0547  BP: 131/91  Pulse: 96  Temp: 98.6 F (37 C)  Resp: 20   Filed Vitals:   12/22/12 1951 12/22/12 2252 12/23/12 0547 12/23/12 0800  BP:  166/112 131/91   Pulse:  123 96   Temp:  97.1 F (36.2 C) 98.6 F (37 C)   TempSrc:  Oral Oral   Resp:  18 20   Height:      Weight:      SpO2: 100% 95% 95% 95%    General: Pt is alert, follows commands appropriately, not in acute distress Cardiovascular: Regular rhythm, tachycardic, S1/S2 +, no murmurs, no rubs, no gallops Respiratory: Clear to auscultation bilaterally, expiratory wheezing presents  Abdominal: Soft, non tender, non distended, bowel sounds +, no guarding Extremities: no edema, no cyanosis, pulses palpable bilaterally DP and PT Neuro: Grossly nonfocal  Discharge Instructions  Discharge Orders    Future Orders Please Complete By Expires   Diet - low sodium heart healthy      Increase activity slowly          Medication List     As of 12/23/2012  9:22 AM    TAKE these medications         albuterol (2.5 MG/3ML) 0.083% nebulizer solution   Commonly known as: PROVENTIL   Take 3 mLs (2.5 mg total) by nebulization every 4 (four) hours as needed. For asthma or shortness of breath.      budesonide-formoterol 160-4.5 MCG/ACT inhaler   Commonly known as: SYMBICORT  Inhale 2 puffs into the lungs 2 (two) times daily.      diphenhydrAMINE 25 mg capsule   Commonly known as: BENADRYL   Take 25 mg by mouth daily as needed. For allergies.      ferrous sulfate 325 (65 FE) MG tablet   Take 325 mg by mouth daily with breakfast.      fluticasone 50 MCG/ACT nasal spray   Commonly known as: FLONASE   Place 2 sprays into the nose daily.      folic acid 400 MCG tablet   Commonly known as: FOLVITE   Take 400 mcg by mouth every morning.      ipratropium-albuterol 0.5-2.5 (3) MG/3ML Soln   Commonly known  as: DUONEB   Take 3 mLs by nebulization every 4 (four) hours as needed. For wheezing or shortness of breath      irbesartan-hydrochlorothiazide 300-25 MG per tablet   Commonly known as: AVALIDE   Take 1 tablet by mouth every morning.      metFORMIN 500 MG tablet   Commonly known as: GLUCOPHAGE   Take 500 mg by mouth 2 (two) times daily with a meal. Take with prednisone to control blood glucose      Potassium 99 MG Tabs   Take 4 tablets by mouth every morning.      predniSONE 10 MG tablet   Commonly known as: DELTASONE   Take 60 mg tablet today and taper down by 10 mg daily until completed      ranitidine 150 MG capsule   Commonly known as: ZANTAC   Take 600 mg by mouth every morning. For heartburn      VICKS BABYRUB EX   Apply 1 application topically as needed. For congestion.           Follow-up Information    Follow up with No Pcp.          The results of significant diagnostics from this hospitalization (including imaging, microbiology, ancillary and laboratory) are listed below for reference.     Microbiology: Recent Results (from the past 240 hour(s))  MRSA PCR SCREENING     Status: Abnormal   Collection Time   12/22/12  9:02 AM      Component Value Range Status Comment   MRSA by PCR POSITIVE (*) NEGATIVE Final      Labs: Basic Metabolic Panel:  Lab 12/23/12 2130 12/22/12 0545 12/21/12 2345 12/20/12 1240  NA 138 136 143 135  K 3.6 3.1* 3.3* 2.6*  CL 106 101 109 100  CO2 24 21 -- 22  GLUCOSE 248* 296* 264* 154*  BUN 14 12 11 8   CREATININE 0.80 0.72 0.70 0.77  CALCIUM 8.6 8.7 -- 8.4  MG -- -- -- --  PHOS -- -- -- --   CBC:  Lab 12/23/12 0525 12/22/12 0545 12/21/12 2345 12/21/12 2335 12/20/12 1240  WBC 12.1* 12.8* -- 11.7* 7.0  NEUTROABS -- -- -- -- --  HGB 7.9* 8.4* 11.2* 9.1* 8.6*  HCT 27.5* 29.3* 33.0* 30.5* 29.6*  MCV 63.5* 63.4* -- 63.4* 64.1*  PLT 381 346 -- 436* 326   CBG:  Lab 12/23/12 0746 12/22/12 2236 12/22/12 1750 12/22/12 1141  12/22/12 0926  GLUCAP 237* 247* 306* 335* 289*     SIGNED: Time coordinating discharge: Over 30 minutes  Debbora Presto, MD  [facility] 12/23/2012, 9:22 AM Pager [phone]  If 7PM-7AM, please contact night-coverage [email or web address] Password [email or web address]

## 2013-01-13 IMAGING — CR DG CHEST 1V PORT
1 series · 1 of 1 positions shown · non-contrast
Comparison: 03/10/2011

CLINICAL DATA: Short of breath

PORTABLE CHEST - 1 VIEW

[view not recorded]
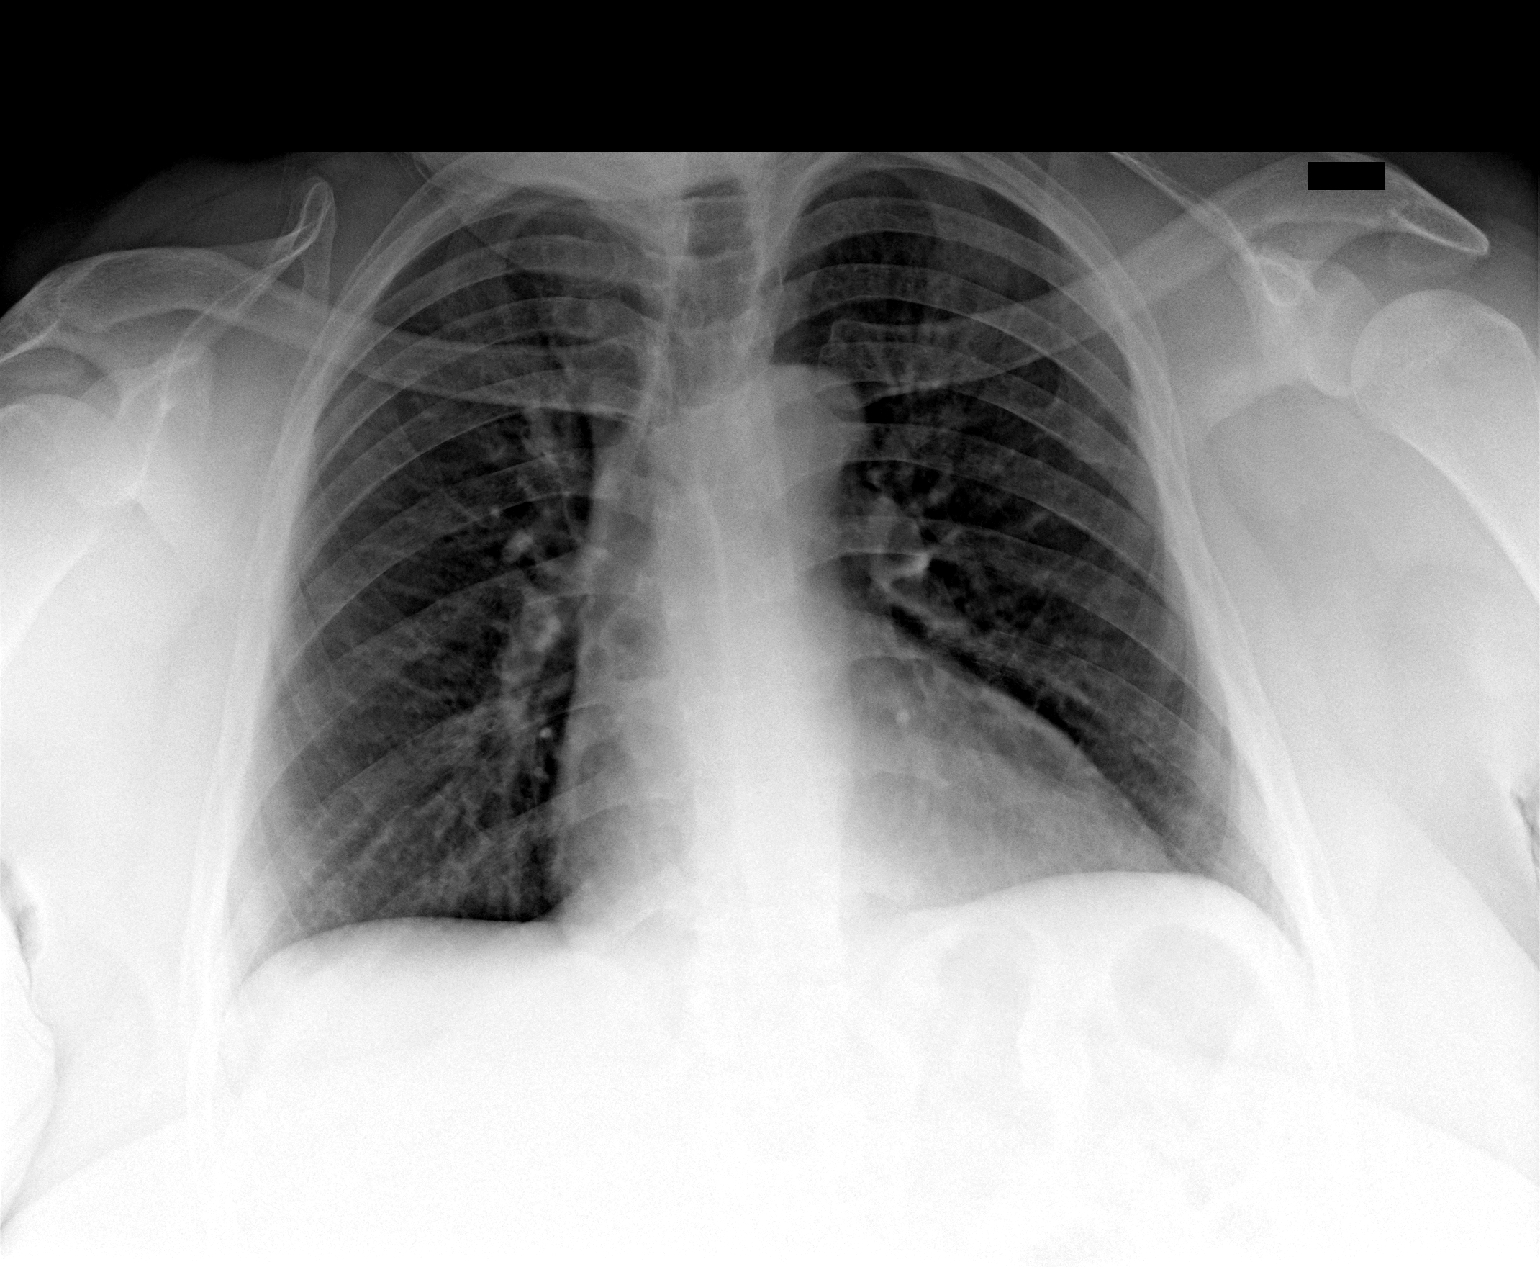

[1 of 1 positions shown; findings below may reference images not displayed]

FINDINGS: Heart size within normal limits considering AP
projection.  Again noted is central airway thickening without
active airspace disease or pleural fluid in one-view.
IMPRESSION: Bronchitic type changes - no acute findings in one-view.

## 2013-02-08 ENCOUNTER — Inpatient Hospital Stay (HOSPITAL_COMMUNITY)
Admission: EM | Admit: 2013-02-08 | Discharge: 2013-02-09 | DRG: 191 | Disposition: A | Discharge status: Home / Self Care | Payer: Medicare Other | Attending: Internal Medicine | Admitting: Internal Medicine

## 2013-02-08 ENCOUNTER — Encounter (HOSPITAL_COMMUNITY): Payer: Self-pay | Admitting: Emergency Medicine

## 2013-02-08 ENCOUNTER — Emergency Department (HOSPITAL_COMMUNITY): Payer: Medicare Other

## 2013-02-08 DIAGNOSIS — G4733 Obstructive sleep apnea (adult) (pediatric): Secondary | ICD-10-CM | POA: Diagnosis present

## 2013-02-08 DIAGNOSIS — I10 Essential (primary) hypertension: Secondary | ICD-10-CM | POA: Diagnosis present

## 2013-02-08 DIAGNOSIS — J441 Chronic obstructive pulmonary disease with (acute) exacerbation: Principal | ICD-10-CM | POA: Diagnosis present

## 2013-02-08 DIAGNOSIS — J329 Chronic sinusitis, unspecified: Secondary | ICD-10-CM | POA: Diagnosis present

## 2013-02-08 DIAGNOSIS — E119 Type 2 diabetes mellitus without complications: Secondary | ICD-10-CM | POA: Diagnosis present

## 2013-02-08 DIAGNOSIS — D509 Iron deficiency anemia, unspecified: Secondary | ICD-10-CM | POA: Diagnosis present

## 2013-02-08 DIAGNOSIS — Z6841 Body Mass Index (BMI) 40.0 and over, adult: Secondary | ICD-10-CM

## 2013-02-08 DIAGNOSIS — J40 Bronchitis, not specified as acute or chronic: Secondary | ICD-10-CM | POA: Diagnosis present

## 2013-02-08 DIAGNOSIS — D649 Anemia, unspecified: Secondary | ICD-10-CM | POA: Diagnosis present

## 2013-02-08 DIAGNOSIS — E876 Hypokalemia: Secondary | ICD-10-CM | POA: Diagnosis present

## 2013-02-08 DIAGNOSIS — J45901 Unspecified asthma with (acute) exacerbation: Secondary | ICD-10-CM | POA: Diagnosis present

## 2013-02-08 DIAGNOSIS — R739 Hyperglycemia, unspecified: Secondary | ICD-10-CM | POA: Diagnosis present

## 2013-02-08 LAB — BLOOD GAS, ARTERIAL
Bicarbonate: 23.8 mEq/L (ref 20.0–24.0)
Drawn by: 235321
O2 Content: 2 L/min
pCO2 arterial: 37.8 mmHg (ref 35.0–45.0)
pH, Arterial: 7.415 (ref 7.350–7.450)
pO2, Arterial: 74.4 mmHg — ABNORMAL LOW (ref 80.0–100.0)

## 2013-02-08 LAB — CBC WITH DIFFERENTIAL/PLATELET
Basophils Relative: 0 % (ref 0–1)
Eosinophils Relative: 13 % — ABNORMAL HIGH (ref 0–5)
Hemoglobin: 8 g/dL — ABNORMAL LOW (ref 12.0–15.0)
MCH: 17.2 pg — ABNORMAL LOW (ref 26.0–34.0)
MCV: 61.4 fL — ABNORMAL LOW (ref 78.0–100.0)
Monocytes Absolute: 0.6 10*3/uL (ref 0.1–1.0)
Monocytes Relative: 7 % (ref 3–12)
Neutrophils Relative %: 40 % — ABNORMAL LOW (ref 43–77)
Platelets: 377 10*3/uL (ref 150–400)
RBC: 4.64 MIL/uL (ref 3.87–5.11)
WBC: 8.2 10*3/uL (ref 4.0–10.5)

## 2013-02-08 LAB — BASIC METABOLIC PANEL
CO2: 23 mEq/L (ref 19–32)
CO2: 19 mEq/L (ref 19–32)
Calcium: 8.9 mg/dL (ref 8.4–10.5)
Calcium: 8.7 mg/dL (ref 8.4–10.5)
Chloride: 102 mEq/L (ref 96–112)
Chloride: 100 mEq/L (ref 96–112)
Creatinine, Ser: 0.65 mg/dL (ref 0.50–1.10)
Glucose, Bld: 145 mg/dL — ABNORMAL HIGH (ref 70–99)
Glucose, Bld: 322 mg/dL — ABNORMAL HIGH (ref 70–99)
Sodium: 137 mEq/L (ref 135–145)

## 2013-02-08 LAB — TSH: TSH: 0.551 u[IU]/mL (ref 0.350–4.500)

## 2013-02-08 LAB — GLUCOSE, CAPILLARY: Glucose-Capillary: 209 mg/dL — ABNORMAL HIGH (ref 70–99)

## 2013-02-08 LAB — MRSA PCR SCREENING: MRSA by PCR: POSITIVE — AB

## 2013-02-08 MED ORDER — BUDESONIDE 0.25 MG/2ML IN SUSP
0.2500 mg | Freq: Two times a day (BID) | RESPIRATORY_TRACT | Status: DC
  Administered 2013-02-08 – 2013-02-09 (×3): 0.25 mg via RESPIRATORY_TRACT
  Filled 2013-02-08 (×5): qty 2

## 2013-02-08 MED ORDER — IRBESARTAN-HYDROCHLOROTHIAZIDE 300-25 MG PO TABS: 1.0000 | ORAL_TABLET | Freq: Every morning | ORAL | Status: DC

## 2013-02-08 MED ORDER — SODIUM CHLORIDE 0.9 % IJ SOLN: 3.0000 mL | Freq: Two times a day (BID) | INTRAMUSCULAR | Status: DC

## 2013-02-08 MED ORDER — POTASSIUM CHLORIDE 10 MEQ/100ML IV SOLN
10.0000 meq | INTRAVENOUS | Status: AC
  Administered 2013-02-08 (×2): 10 meq via INTRAVENOUS
  Filled 2013-02-08 (×2): qty 100

## 2013-02-08 MED ORDER — DIPHENHYDRAMINE HCL 25 MG PO CAPS: 25.0000 mg | ORAL_CAPSULE | Freq: Three times a day (TID) | ORAL | Status: DC | PRN

## 2013-02-08 MED ORDER — ACETAMINOPHEN 325 MG PO TABS: 650.0000 mg | ORAL_TABLET | Freq: Four times a day (QID) | ORAL | Status: DC | PRN

## 2013-02-08 MED ORDER — ALBUTEROL (5 MG/ML) CONTINUOUS INHALATION SOLN
10.0000 mg/h | INHALATION_SOLUTION | Freq: Once | RESPIRATORY_TRACT | Status: AC
  Administered 2013-02-08: 10 mg/h via RESPIRATORY_TRACT
  Filled 2013-02-08: qty 20

## 2013-02-08 MED ORDER — HYDROCHLOROTHIAZIDE 25 MG PO TABS
25.0000 mg | ORAL_TABLET | Freq: Every day | ORAL | Status: DC
  Administered 2013-02-08 – 2013-02-09 (×2): 25 mg via ORAL
  Filled 2013-02-08 (×2): qty 1

## 2013-02-08 MED ORDER — ACETAMINOPHEN 650 MG RE SUPP: 650.0000 mg | Freq: Four times a day (QID) | RECTAL | Status: DC | PRN

## 2013-02-08 MED ORDER — SODIUM CHLORIDE 0.9 % IJ SOLN
3.0000 mL | Freq: Two times a day (BID) | INTRAMUSCULAR | Status: DC
  Administered 2013-02-09: 3 mL via INTRAVENOUS

## 2013-02-08 MED ORDER — FERROUS SULFATE 325 (65 FE) MG PO TABS
325.0000 mg | ORAL_TABLET | Freq: Every day | ORAL | Status: DC
  Administered 2013-02-09: 325 mg via ORAL
  Filled 2013-02-08 (×2): qty 1

## 2013-02-08 MED ORDER — IRBESARTAN 300 MG PO TABS
300.0000 mg | ORAL_TABLET | Freq: Every day | ORAL | Status: DC
  Administered 2013-02-08 – 2013-02-09 (×2): 300 mg via ORAL
  Filled 2013-02-08 (×2): qty 1

## 2013-02-08 MED ORDER — MAGNESIUM SULFATE 50 % IJ SOLN: 2.0000 g | Freq: Once | INTRAMUSCULAR | Status: DC

## 2013-02-08 MED ORDER — BUDESONIDE 0.25 MG/2ML IN SUSP: 0.2500 mg | Freq: Two times a day (BID) | RESPIRATORY_TRACT | Status: DC

## 2013-02-08 MED ORDER — METHYLPREDNISOLONE SODIUM SUCC 40 MG IJ SOLR
40.0000 mg | Freq: Two times a day (BID) | INTRAMUSCULAR | Status: AC
  Administered 2013-02-08 (×2): 40 mg via INTRAVENOUS
  Filled 2013-02-08 (×2): qty 1

## 2013-02-08 MED ORDER — ONDANSETRON HCL 4 MG PO TABS: 4.0000 mg | ORAL_TABLET | Freq: Four times a day (QID) | ORAL | Status: DC | PRN

## 2013-02-08 MED ORDER — DEXTROSE 5 % IV SOLN
500.0000 mg | INTRAVENOUS | Status: DC
  Administered 2013-02-08: 500 mg via INTRAVENOUS
  Filled 2013-02-08: qty 500

## 2013-02-08 MED ORDER — POTASSIUM CHLORIDE CRYS ER 20 MEQ PO TBCR
40.0000 meq | EXTENDED_RELEASE_TABLET | Freq: Once | ORAL | Status: AC
  Administered 2013-02-08: 40 meq via ORAL
  Filled 2013-02-08: qty 2

## 2013-02-08 MED ORDER — INSULIN ASPART 100 UNIT/ML ~~LOC~~ SOLN
0.0000 [IU] | Freq: Three times a day (TID) | SUBCUTANEOUS | Status: DC
  Administered 2013-02-08: 5 [IU] via SUBCUTANEOUS
  Administered 2013-02-08: 7 [IU] via SUBCUTANEOUS
  Administered 2013-02-09 (×2): 3 [IU] via SUBCUTANEOUS

## 2013-02-08 MED ORDER — ENOXAPARIN SODIUM 40 MG/0.4ML ~~LOC~~ SOLN
40.0000 mg | SUBCUTANEOUS | Status: DC
  Administered 2013-02-08: 40 mg via SUBCUTANEOUS
  Filled 2013-02-08: qty 0.4

## 2013-02-08 MED ORDER — LEVOFLOXACIN 500 MG PO TABS
500.0000 mg | ORAL_TABLET | Freq: Every day | ORAL | Status: DC
  Administered 2013-02-08 – 2013-02-09 (×2): 500 mg via ORAL
  Filled 2013-02-08 (×2): qty 1

## 2013-02-08 MED ORDER — ONDANSETRON HCL 4 MG/2ML IJ SOLN: 4.0000 mg | Freq: Four times a day (QID) | INTRAMUSCULAR | Status: DC | PRN

## 2013-02-08 MED ORDER — ENOXAPARIN SODIUM 80 MG/0.8ML ~~LOC~~ SOLN
70.0000 mg | SUBCUTANEOUS | Status: DC
  Administered 2013-02-09: 70 mg via SUBCUTANEOUS
  Filled 2013-02-08: qty 0.8

## 2013-02-08 MED ORDER — LEVALBUTEROL HCL 0.63 MG/3ML IN NEBU
0.6300 mg | INHALATION_SOLUTION | Freq: Four times a day (QID) | RESPIRATORY_TRACT | Status: DC | PRN
  Administered 2013-02-09: 0.63 mg via RESPIRATORY_TRACT
  Filled 2013-02-08: qty 3

## 2013-02-08 MED ORDER — METHYLPREDNISOLONE SODIUM SUCC 125 MG IJ SOLR
125.0000 mg | Freq: Once | INTRAMUSCULAR | Status: AC
  Administered 2013-02-08: 125 mg via INTRAVENOUS
  Filled 2013-02-08: qty 2

## 2013-02-08 MED ORDER — LEVALBUTEROL HCL 0.63 MG/3ML IN NEBU
0.6300 mg | INHALATION_SOLUTION | Freq: Four times a day (QID) | RESPIRATORY_TRACT | Status: DC
  Administered 2013-02-08 – 2013-02-09 (×6): 0.63 mg via RESPIRATORY_TRACT
  Filled 2013-02-08 (×9): qty 3

## 2013-02-08 MED ORDER — FLUTICASONE PROPIONATE 50 MCG/ACT NA SUSP
2.0000 | Freq: Every day | NASAL | Status: DC
  Administered 2013-02-08 – 2013-02-09 (×2): 2 via NASAL
  Filled 2013-02-08: qty 16

## 2013-02-08 MED ORDER — IPRATROPIUM BROMIDE 0.02 % IN SOLN
0.5000 mg | Freq: Four times a day (QID) | RESPIRATORY_TRACT | Status: DC
  Administered 2013-02-08 – 2013-02-09 (×6): 0.5 mg via RESPIRATORY_TRACT
  Filled 2013-02-08 (×7): qty 2.5

## 2013-02-08 MED ORDER — PREDNISONE 50 MG PO TABS
60.0000 mg | ORAL_TABLET | Freq: Every day | ORAL | Status: DC
  Administered 2013-02-09: 60 mg via ORAL
  Filled 2013-02-08 (×2): qty 1

## 2013-02-08 MED ORDER — FAMOTIDINE 20 MG PO TABS
20.0000 mg | ORAL_TABLET | Freq: Every day | ORAL | Status: DC
  Administered 2013-02-08 – 2013-02-09 (×2): 20 mg via ORAL
  Filled 2013-02-08 (×2): qty 1

## 2013-02-08 MED ORDER — FOLIC ACID 1 MG PO TABS
1.0000 mg | ORAL_TABLET | Freq: Every morning | ORAL | Status: DC
  Administered 2013-02-08 – 2013-02-09 (×2): 1 mg via ORAL
  Filled 2013-02-08 (×2): qty 1

## 2013-02-08 MED ORDER — MAGNESIUM SULFATE 40 MG/ML IJ SOLN
2.0000 g | Freq: Once | INTRAMUSCULAR | Status: AC
  Administered 2013-02-08: 2 g via INTRAVENOUS
  Filled 2013-02-08: qty 50

## 2013-02-08 MED ORDER — DEXTROSE 5 % IV SOLN: 500.0000 mg | INTRAVENOUS | Status: DC

## 2013-02-08 MED ORDER — LEVOFLOXACIN 500 MG PO TABS
750.0000 mg | ORAL_TABLET | Freq: Every day | ORAL | Status: DC
  Filled 2013-02-08: qty 2

## 2013-02-08 NOTE — H&P (Signed)
[facility] History and Physical  [name] WUJ:[id number] DOB: 1975/12/15 DOA: 02/08/2013  Referring physician: ED physician. PCP: No Pcp  Specialists: None.  Chief Complaint: Shortness of breath.  HPI: [name] [name] is a 37 y.o. female with known history of bronchial asthma, hypertension and chronic anemia presented with slowly worsening shortness of breath over the last few days. In the ER patient was found to have bilateral expiratory wheeze and has had multiple nebulizer treatments as per the patient was still wheezing. Patient at this time will be admitted for further management of her acute exacerbation of asthma. Patient states over the last few days she also has been having productive cough with subjective feeling of fever chills. Denies any chest pain. Patient states she has been taking her medications as advised.  Review of Systems: As presented in the history of presenting illness nothing else significant.  Past Medical History  Diagnosis Date  . Asthma   . Hypertension   . Chronic anemia   . Abnormal TSH   . Morbid obesity   . GERD (gastroesophageal reflux disease)   . Seasonal allergies   . Nasal polyps   . Diabetes mellitus     type 2  . Chronic bronchitis   . Sleep apnea   . Shortness of breath 12/14/11    "related to how bad my asthma is; sometimes @ rest, lying down, w/exertion"  . Pneumonia   . Blood transfusion   . Inguinal hernia unilateral, non-recurrent     right  . Kidney infection 2008  . Headache   . Anxiety   . Depression   . PTSD (post-traumatic stress disorder)     "related to prior hospitalizations; things that happened when I was in hospital"   Past Surgical History  Procedure Laterality Date  . Cervical cerclage  1999; 2002  . Tubal ligation  2002   Social History:  reports that she has never smoked. She has never used smokeless tobacco. She reports that she does not drink alcohol or use illicit drugs. Lives at home. where  does patient live-- Can do ADLs. Can patient participate in ADLs?  Allergies  Allergen Reactions  . Aspirin Anaphylaxis  . Ibuprofen Anaphylaxis  . Shellfish Allergy Hives and Swelling    Facial and oral swelling  . Menthol Swelling    Tongue swelling  . Other Other (See Comments)    Plastic thermometer covers cause sloughing off of skin on tongue and inside mouth    Family History  Problem Relation Age of Onset  . Diabetes Mother   . Heart disease Father       Prior to Admission medications   Medication Sig Start Date End Date Taking? Authorizing [name] [name]  albuterol (PROVENTIL) (2.5 MG/3ML) 0.083% nebulizer solution Take 2.5 mg by nebulization every 4 (four) hours as needed for wheezing or shortness of breath. For asthma or shortness of breath. 12/23/12  Yes [name] Ogle, MD  budesonide-formoterol [facility]) 160-4.5 MCG/ACT inhaler Inhale 2 puffs into the lungs 2 (two) times daily.   Yes Historical [name] Congrove, MD  diphenhydrAMINE (BENADRYL) 25 mg capsule Take 25 mg by mouth daily as needed. For allergies.   Yes Historical [name] Mcmillon, MD  ferrous sulfate 325 (65 FE) MG tablet Take 325 mg by mouth daily with breakfast.   Yes Historical [name] Perdew, MD  fluticasone (FLONASE) 50 MCG/ACT nasal spray Place 2 sprays into the nose daily.    Yes Historical [name] Wolford, MD  folic acid (FOLVITE) 400 MCG tablet Take 400 mcg  by mouth every morning.    Yes Historical [name] Schepp, MD  ipratropium-albuterol (DUONEB) 0.5-2.5 (3) MG/3ML SOLN Take 3 mLs by nebulization every 4 (four) hours as needed (for wheezing or shortness of breath). For wheezing or shortness of breath 09/01/12  Yes [name] Zenaida Niece Wingen, PA-C  irbesartan-hydrochlorothiazide (AVALIDE) 300-25 MG per tablet Take 1 tablet by mouth every morning.    Yes Historical [name] Steinhaus, MD  Potassium 99 MG TABS Take 4 tablets by mouth every morning.   Yes Historical [name] Skufca, MD  ranitidine (ZANTAC) 150 MG capsule Take 600 mg by mouth every morning. For heartburn    Yes Historical [name] Zito, MD   Physical Exam: Filed Vitals:   02/08/13 0300 02/08/13 0315 02/08/13 0400 02/08/13 0530  BP: 154/86  179/97 134/58  Pulse: 131  133 125  Resp: 16  26 23   SpO2: 91% 95% 100% 96%     General:   Morbidly obese. Not in acute distress.  Eyes: Anicteric no pallor.  ENT: No discharge from the ears eyes nose or mouth.  Neck: No mass felt.  Cardiovascular: S1-S2 heard regular. Tachycardic.  Respiratory: Bilateral expiratory wheeze heard. No crepitations.  Abdomen: Soft nontender bowel sounds present.  Skin: No rash.  Musculoskeletal: No edema.  Psychiatric: Appears normal.  Neurologic: Moves all extremities 5 x 5. Alert awake oriented to time place and person.  Labs on Admission:  Basic Metabolic Panel:  Recent Labs Lab 02/08/13 0350  NA 137  K 2.8*  CL 102  CO2 23  GLUCOSE 145*  BUN 8  CREATININE 0.73  CALCIUM 8.9   Liver Function Tests: No results found for this basename: AST, ALT, ALKPHOS, BILITOT, PROT, ALBUMIN,  in the last 168 hours No results found for this basename: LIPASE, AMYLASE,  in the last 168 hours No results found for this basename: AMMONIA,  in the last 168 hours CBC:  Recent Labs Lab 02/08/13 0350  WBC 8.2  NEUTROABS 3.2  HGB 8.0*  HCT 28.5*  MCV 61.4*  PLT 377   Cardiac Enzymes: No results found for this basename: CKTOTAL, CKMB, CKMBINDEX, TROPONINI,  in the last 168 hours  BNP (last 3 results) No results found for this basename: PROBNP,  in the last 8760 hours CBG: No results found for this basename: GLUCAP,  in the last 168 hours  Radiological Exams on Admission: Dg Chest 2 View  02/08/2013  *RADIOLOGY REPORT*  Clinical Data: Asthma, wheezing  CHEST - 2 VIEW  Comparison: 12/20/2012  Findings: Mild central peribronchial thickening.  No confluent airspace opacity, pleural effusion, or pneumothorax. Cardiomediastinal contours within normal range.  No acute osseous finding.  IMPRESSION: Central  peribronchial thickening, in keeping with bronchitis or reactive airway disease.  No focal consolidation   Original Report Authenticated By: Jearld Lesch, M.D.      Assessment/Plan Principal Problem:   Acute asthma exacerbation Active Problems:   Bronchitis   HTN (hypertension), malignant   Chronic anemia   Hyperglycemia   1. Acute exacerbation of chronic asthma - since patient is able to complete sentences and also ABG does not show any carbon dioxide retention patient will be admitted to telemetry. Continue with IV steroids nebulizer and Pulmicort. Since patient gives history of productive cough patient has been placed on Zithromax. Check influenza panel. 2. Hypokalemia - probably related to hydrochlorothiazide use and see #3. Patient has received 80 mg by mouth and 2 runs of potassium chloride IV has been ordered. Repeat metabolic panel with magnesium levels has been ordered for  12 noon. 3. Hypertension - continue present medications. Given patient's hypokalemia may consider doing Renin aldosterone ratio in the outpatient setting to check for secondary causes of hypertension. 4. Chronic anemia - patient is on iron replacement. 5. Hyperglycemia - patient states she usually gets hyperglycemic when she is on steroids. Check hemoglobin A1c and patient has been placed on sliding-scale coverage. 6. History of OSA - patient states she was diagnosed with OSA but has not used CPAP as she has followup with her primary care at [facility]  Code Status: Full code.  Family Communication: None.  Disposition Plan: Admit to inpatient.   [name] [name],[name] [name] N. [facility] Pager [phone].  If 7PM-7AM, please contact night-coverage [email or web address] Password [facility] 02/08/2013, 6:13 AM

## 2013-02-08 NOTE — ED Notes (Signed)
Attempted IV start x 2. 2nd RN attempting.

## 2013-02-08 NOTE — ED Provider Notes (Signed)
History     CSN: [id number]  Arrival date & time 02/08/13  0214   First MD Initiated Contact with Patient 02/08/13 0241      Chief Complaint  Patient presents with  . Shortness of Breath    (Consider location/radiation/quality/duration/timing/severity/associated sxs/prior treatment) HPI [name] [name] is a 37 y.o. female who presents to ED with complaint of cough, shortness of breath, sinus pressure for about 2 wks. States symptoms are worsening. States has been doing regular treatments at home with no relief. Did two treatments at home just prior to coming here. Received another treatment upon arrival. States not feeling ay better. Denies recent steroids or antibiotics. Denies fever, chill. No chest pain or LE swelling. exertion makes symptoms worse. Nothing making it better.  Past Medical History  Diagnosis Date  . Asthma   . Hypertension   . Chronic anemia   . Abnormal TSH   . Morbid obesity   . GERD (gastroesophageal reflux disease)   . Seasonal allergies   . Nasal polyps   . Diabetes mellitus     type 2  . Chronic bronchitis   . Sleep apnea   . Shortness of breath 12/14/11    "related to how bad my asthma is; sometimes @ rest, lying down, w/exertion"  . Pneumonia   . Blood transfusion   . Inguinal hernia unilateral, non-recurrent     right  . Kidney infection 2008  . Headache   . Anxiety   . Depression   . PTSD (post-traumatic stress disorder)     "related to prior hospitalizations; things that happened when I was in hospital"    Past Surgical History  Procedure Laterality Date  . Cervical cerclage  1999; 2002  . Tubal ligation  2002    Family History  Problem Relation Age of Onset  . Diabetes Mother   . Heart disease Father     History  Substance Use Topics  . Smoking status: Never Smoker   . Smokeless tobacco: Never Used  . Alcohol Use: No     Comment: 12/14/11 "only on birthdays; parties, and such"    OB History   Grav Para Term Preterm  Abortions TAB SAB Ect Mult Living                  Review of Systems  Constitutional: Negative for fever and chills.  HENT: Positive for congestion. Negative for sore throat, neck pain and neck stiffness.   Respiratory: Positive for choking, shortness of breath and wheezing. Negative for chest tightness.   Cardiovascular: Negative for chest pain and leg swelling.  Gastrointestinal: Negative.   Genitourinary: Negative for dysuria and flank pain.  Skin: Negative for rash.  Neurological: Negative for dizziness, weakness and headaches.    Allergies  Aspirin; Ibuprofen; Shellfish allergy; Menthol; and Other  Home Medications   Current Outpatient Rx  Name  Route  Sig  Dispense  Refill  . albuterol (PROVENTIL) (2.5 MG/3ML) 0.083% nebulizer solution   Nebulization   Take 3 mLs (2.5 mg total) by nebulization every 4 (four) hours as needed. For asthma or shortness of breath.   75 mL   3   . budesonide-formoterol (SYMBICORT) 160-4.5 MCG/ACT inhaler   Inhalation   Inhale 2 puffs into the lungs 2 (two) times daily.         . diphenhydrAMINE (BENADRYL) 25 mg capsule   Oral   Take 25 mg by mouth daily as needed. For allergies.         Marland Kitchen  ferrous sulfate 325 (65 FE) MG tablet   Oral   Take 325 mg by mouth daily with breakfast.         . fluticasone (FLONASE) 50 MCG/ACT nasal spray   Nasal   Place 2 sprays into the nose daily.          . folic acid (FOLVITE) 400 MCG tablet   Oral   Take 400 mcg by mouth every morning.          Marland Kitchen ipratropium-albuterol (DUONEB) 0.5-2.5 (3) MG/3ML SOLN   Nebulization   Take 3 mLs by nebulization every 4 (four) hours as needed. For wheezing or shortness of breath         . irbesartan-hydrochlorothiazide (AVALIDE) 300-25 MG per tablet   Oral   Take 1 tablet by mouth every morning.          . Liniments (VICKS BABYRUB EX)   Apply externally   Apply 1 application topically as needed. For congestion.         . metFORMIN (GLUCOPHAGE)  500 MG tablet   Oral   Take 500 mg by mouth 2 (two) times daily with a meal. Take with prednisone to control blood glucose         . Potassium 99 MG TABS   Oral   Take 4 tablets by mouth every morning.         . predniSONE (DELTASONE) 10 MG tablet      Take 60 mg tablet today and taper down by 10 mg daily until completed   30 tablet   0   . ranitidine (ZANTAC) 150 MG capsule   Oral   Take 600 mg by mouth every morning. For heartburn           BP 154/86  Pulse 131  Resp 16  SpO2 95%  LMP 01/25/2013  Physical Exam  Constitutional: She is oriented to person, place, and time. She appears well-developed and well-nourished. She appears distressed.  HENT:  Head: Normocephalic.  Right Ear: External ear normal.  Left Ear: External ear normal.  Nose: Nose normal.  Mouth/Throat: Oropharynx is clear and moist.  No sinus tenderness  Eyes: Conjunctivae are normal.  Neck: Neck supple.  Cardiovascular: Regular rhythm, normal heart sounds and intact distal pulses.   No murmur heard. tachycardic  Pulmonary/Chest: She has wheezes.  Audible inspiratory and expiratory wheezing bilaterally. Accessory muscle use  Abdominal: Soft. Bowel sounds are normal. She exhibits no distension. There is no tenderness. There is no rebound.  Musculoskeletal: She exhibits no edema.  Neurological: She is alert and oriented to person, place, and time.  Skin: Skin is warm and dry.    ED Course  Procedures (including critical care time)   Date: 02/08/2013  Rate: 112  Rhythm: sinus tachycardia  QRS Axis: normal  Intervals: normal  ST/T Wave abnormalities: normal  Conduction Disutrbances:none  Narrative Interpretation:   Old EKG Reviewed: unchanged   Results for orders placed during the hospital encounter of 02/08/13  CBC WITH DIFFERENTIAL      Result Value Range   WBC 8.2  4.0 - 10.5 K/uL   RBC 4.64  3.87 - 5.11 MIL/uL   Hemoglobin 8.0 (*) 12.0 - 15.0 g/dL   HCT 16.1 (*) 09.6 - 04.5 %    MCV 61.4 (*) 78.0 - 100.0 fL   MCH 17.2 (*) 26.0 - 34.0 pg   MCHC 28.1 (*) 30.0 - 36.0 g/dL   RDW 40.9 (*) 81.1 - 91.4 %  Platelets 377  150 - 400 K/uL   Neutrophils Relative 40 (*) 43 - 77 %   Lymphocytes Relative 40  12 - 46 %   Monocytes Relative 7  3 - 12 %   Eosinophils Relative 13 (*) 0 - 5 %   Basophils Relative 0  0 - 1 %   Neutro Abs 3.2  1.7 - 7.7 K/uL   Lymphs Abs 3.3  0.7 - 4.0 K/uL   Monocytes Absolute 0.6  0.1 - 1.0 K/uL   Eosinophils Absolute 1.1 (*) 0.0 - 0.7 K/uL   Basophils Absolute 0.0  0.0 - 0.1 K/uL   RBC Morphology ELLIPTOCYTES    BASIC METABOLIC PANEL      Result Value Range   Sodium 137  135 - 145 mEq/L   Potassium 2.8 (*) 3.5 - 5.1 mEq/L   Chloride 102  96 - 112 mEq/L   CO2 23  19 - 32 mEq/L   Glucose, Bld 145 (*) 70 - 99 mg/dL   BUN 8  6 - 23 mg/dL   Creatinine, Ser 1.61  0.50 - 1.10 mg/dL   Calcium 8.9  8.4 - 09.6 mg/dL   GFR calc non Af Amer >90  >90 mL/min   GFR calc Af Amer >90  >90 mL/min   Dg Chest 2 View  02/08/2013  *RADIOLOGY REPORT*  Clinical Data: Asthma, wheezing  CHEST - 2 VIEW  Comparison: 12/20/2012  Findings: Mild central peribronchial thickening.  No confluent airspace opacity, pleural effusion, or pneumothorax. Cardiomediastinal contours within normal range.  No acute osseous finding.  IMPRESSION: Central peribronchial thickening, in keeping with bronchitis or reactive airway disease.  No focal consolidation   Original Report Authenticated By: Jearld Lesch, M.D.     4:55 AM Pt reassessed. Not much improvement with hour long neb. Pt now has had 3 regular nebs and 1hr long treatment. Received solumedrol 125iv. Will try magnesium next. Pt with hx of intubation for the same. Will need admission for further management and careful monitoring.   1. Asthma exacerbation   2. Chronic anemia   3. HTN (hypertension)       MDM  Pt with hx of asthma, hx of intubations for the same. States not breathing well for 2 wks. Nebs at home not  helping. Pt is working hard to breath. She has diffuse inspiratory and expiratory wheezes. She did not improve with nebs, steroids, mag. Will admit.          Lottie Mussel, PA-C 02/09/13 [phone]

## 2013-02-08 NOTE — ED Notes (Signed)
Per EMS, pt has been SOB x 2 weeks coupled with a sinus infection. Pt took albuterol neb at home 2h ago but wheezing unresolved. Nebs (Albuterol & Atrovent) given enroute.

## 2013-02-08 NOTE — ED Notes (Signed)
Pt is aware of the need for a urine sample.  

## 2013-02-08 NOTE — ED Notes (Signed)
Report given-transfer to [phone]

## 2013-02-08 NOTE — Progress Notes (Signed)
[facility] PROGRESS NOTE  Yaretsi Humphres ZOX:[id number] DOB: 08/24/1976 DOA: 02/08/2013 PCP: No Pcp  Brief narrative: [name] [name] is an 37 y.o. female with a past medical history of bronchial asthma, hypertension, and chronic anemia who was admitted to the hospital earlier today with worsening dyspnea secondary to an acute asthma exacerbation.  Assessment/Plan: Principal Problem:   Acute asthma exacerbation / bronchitis / chronic sinusitis -Patient was admitted to telemetry and placed on IV steroids, nebulized bronchodilator treatment, and Pulmicort. -Empiric azithromycin ordered on admission. We'll switch this to Levaquin for broader coverage given her chronic sinusitis. Continue Flonase. Active Problems:   Obstructive sleep apnea -Not on CPAP. Follow up with PCP at [facility].   HTN (hypertension), malignant -Reasonable control on HCTZ/Avapro.   Chronic anemia -Continue iron supplementation. Likely due to menstrual losses.   Hyperglycemia -Followup hemoglobin A1c. Continue sliding scale insulin for now. -Likely steroid induced.   Hypokalemia -Secondary to hydrochlorothiazide. Potassium being replaced. Magnesium levels within normal limits.   Code Status: Full. Family Communication: None at bedside. Disposition Plan: Home when stable.   Medical Consultants:  None.  Other Consultants:  None.  Anti-infectives:  Levaquin 02/08/2013--->   Azithromycin 02/08/2013--->02/08/13  HPI/Subjective: [name] [name] tells me she has chronic sinus problems and has been scheduled for surgery but the surgery could not be done recently due to her anemia. She describes significant postnasal drainage and thinks that the chronic sinus drainage exacerbates her asthma.  Objective: Filed Vitals:   02/08/13 0905 02/08/13 1153 02/08/13 1500 02/08/13 1527  BP: 155/83  151/89   Pulse: 111  117   Temp: 98.4 F (36.9 C)  98.5 F (36.9 C)   TempSrc: Oral  Oral   Resp: 20  20    Height: 5\' 6"  (1.676 m)     Weight: 136.986 kg (302 lb)     SpO2: 100% 98% 100% 99%    Intake/Output Summary (Last 24 hours) at 02/08/13 1657 Last data filed at 02/08/13 1500  Gross per 24 hour  Intake    480 ml  Output      0 ml  Net    480 ml    Exam: Gen:  NAD Cardiovascular:  RRR, No M/R/G Respiratory:  Lungs CTAB, no wheezes Gastrointestinal:  Abdomen soft, NT/ND, + BS Extremities:  No C/E/C  Data Reviewed: Basic Metabolic Panel:  Recent Labs Lab 02/08/13 0350 02/08/13 1200  NA 137 135  K 2.8* 4.1  CL 102 100  CO2 23 19  GLUCOSE 145* 322*  BUN 8 7  CREATININE 0.73 0.65  CALCIUM 8.9 8.7  MG  --  2.0   GFR Estimated Creatinine Clearance: 137.4 ml/min (by C-G formula based on Cr of 0.65).  CBC:  Recent Labs Lab 02/08/13 0350  WBC 8.2  NEUTROABS 3.2  HGB 8.0*  HCT 28.5*  MCV 61.4*  PLT 377   CBG:  Recent Labs Lab 02/08/13 1201 02/08/13 1646  GLUCAP 334* 258*   Hgb A1c No results found for this basename: HGBA1C,  in the last 72 hours Thyroid function studies No results found for this basename: TSH, T4TOTAL, FREET3, T3FREE, THYROIDAB,  in the last 72 hours   Procedures and Diagnostic Studies: Dg Chest 2 View  02/08/2013  *RADIOLOGY REPORT*  Clinical Data: Asthma, wheezing  CHEST - 2 VIEW  Comparison: 12/20/2012  Findings: Mild central peribronchial thickening.  No confluent airspace opacity, pleural effusion, or pneumothorax. Cardiomediastinal contours within normal range.  No acute osseous finding.  IMPRESSION: Central peribronchial thickening,  in keeping with bronchitis or reactive airway disease.  No focal consolidation   Original Report Authenticated By: Jearld Lesch, M.D.     Scheduled Meds: . azithromycin  500 mg Intravenous Q24H  . budesonide (PULMICORT) nebulizer solution  0.25 mg Nebulization BID  . enoxaparin (LOVENOX) injection  40 mg Subcutaneous Q24H  . famotidine  20 mg Oral Daily  . [START ON 02/09/2013] ferrous sulfate   325 mg Oral Q breakfast  . fluticasone  2 spray Each Nare Daily  . folic acid  1 mg Oral q morning - 10a  . irbesartan  300 mg Oral Daily   And  . hydrochlorothiazide  25 mg Oral Daily  . insulin aspart  0-9 Units Subcutaneous TID WC  . ipratropium  0.5 mg Nebulization Q6H  . levalbuterol  0.63 mg Nebulization Q6H  . methylPREDNISolone (SOLU-MEDROL) injection  40 mg Intravenous Q12H  . sodium chloride  3 mL Intravenous Q12H  . sodium chloride  3 mL Intravenous Q12H   Continuous Infusions:   Time spent: 30 minutes.   LOS: 0 days   RAMA,CHRISTINA  [facility] Pager [phone].  If 8PM-8AM, please contact night-coverage at [email or web address], password [facility] 02/08/2013, 4:57 PM

## 2013-02-08 NOTE — ED Notes (Signed)
RT notified

## 2013-02-08 NOTE — ED Notes (Signed)
Patient transported to X-ray 

## 2013-02-08 NOTE — ED Notes (Signed)
ZOX:[id number]<AV> Expected date:02/08/13<BR> Expected time: 2:02 AM<BR> Means of arrival:Ambulance<BR> Comments:<BR> Asthma

## 2013-02-09 LAB — RAPID URINE DRUG SCREEN, HOSP PERFORMED
Amphetamines: NOT DETECTED
Barbiturates: NOT DETECTED
Cocaine: NOT DETECTED
Tetrahydrocannabinol: NOT DETECTED

## 2013-02-09 LAB — CBC
Hemoglobin: 7.7 g/dL — ABNORMAL LOW (ref 12.0–15.0)
MCH: 17.6 pg — ABNORMAL LOW (ref 26.0–34.0)
MCHC: 28.5 g/dL — ABNORMAL LOW (ref 30.0–36.0)
Platelets: 346 10*3/uL (ref 150–400)
RBC: 4.38 MIL/uL (ref 3.87–5.11)

## 2013-02-09 LAB — BASIC METABOLIC PANEL
BUN: 12 mg/dL (ref 6–23)
Calcium: 9.4 mg/dL (ref 8.4–10.5)
GFR calc Af Amer: >90 mL/min (ref >90)
GFR calc non Af Amer: >90 mL/min (ref >90)
Glucose, Bld: 236 mg/dL — ABNORMAL HIGH (ref 70–99)
Potassium: 3.8 mEq/L (ref 3.5–5.1)
Sodium: 135 mEq/L (ref 135–145)

## 2013-02-09 LAB — HEMOGLOBIN A1C
Hgb A1c MFr Bld: 6.9 % — ABNORMAL HIGH (ref <5.7)
Mean Plasma Glucose: 151 mg/dL — ABNORMAL HIGH (ref <117)

## 2013-02-09 MED ORDER — PREDNISONE (PAK) 10 MG PO TABS: ORAL_TABLET | ORAL | Status: DC

## 2013-02-09 MED ORDER — MUPIROCIN 2 % EX OINT
1.0000 "application " | TOPICAL_OINTMENT | Freq: Two times a day (BID) | CUTANEOUS | Status: DC
  Administered 2013-02-09: 1 via NASAL
  Filled 2013-02-09: qty 22

## 2013-02-09 MED ORDER — CHLORHEXIDINE GLUCONATE CLOTH 2 % EX PADS: 6.0000 | MEDICATED_PAD | Freq: Every day | CUTANEOUS | Status: DC

## 2013-02-09 MED ORDER — LEVOFLOXACIN 500 MG PO TABS: 500.0000 mg | ORAL_TABLET | Freq: Every day | ORAL | Status: DC

## 2013-02-09 MED ORDER — MUPIROCIN 2 % EX OINT: 1.0000 "application " | TOPICAL_OINTMENT | Freq: Two times a day (BID) | CUTANEOUS | Status: DC

## 2013-02-09 NOTE — Progress Notes (Addendum)
Pt stating she wants to leave the hospital as she is not satisfied with her care. Tells NT she is calling a cab. When Nurse comes in to talk to patient, her anger intensifies at the staff communicating with one another, she refers to this as "talking behind her back". RN explains how staff must communicate such important information and advises her not to leave AMA. She insists on leaving. Pt then strips her bed of linen, takes off her gown and asks for her IV to come out. Then patient decides she is staying. Then patient decides to leave, then decided to stay.  When RN tries to put on telemetry box, pt states that I am "insulting her intelligence" and that she can do it herself. Pt doesn't want any staff coming into her room until doctor rounds. Explained to the patient that we are responsible for checking on her hourly but will try to limit interupts to respect her wishes. Pt's HR trending high as she continues to talk loudly and laugh inappropriately at any education the RN discusses with patient , in 130-140s, MD notified . No new orders at this time, will continue to monitor patient.  Earnest Conroy. Clelia Croft, RN

## 2013-02-09 NOTE — Progress Notes (Signed)
Patient discharged home, discharge instructions given and explained to patient and she verbalized understanding,  denies any pain/distress. Skin intact no wound.

## 2013-02-09 NOTE — ED Provider Notes (Signed)
Medical screening examination/treatment/procedure(s) were performed by non-physician practitioner and as supervising physician I was immediately available for consultation/collaboration.  Soumya Colson M Mileidy Atkin, MD 02/09/13 0656 

## 2013-02-09 NOTE — Progress Notes (Signed)
Inpatient Diabetes Program Recommendations  AACE/ADA: New Consensus Statement on Inpatient Glycemic Control (2013)  Target Ranges:  Prepandial:   less than 140 mg/dL      Peak postprandial:   less than 180 mg/dL (1-2 hours)      Critically ill patients:  140 - 180 mg/dL   Reason for Visit: Hyperglycemia  37 y.o. female with known history of bronchial asthma, hypertension and chronic anemia presented with slowly worsening shortness of breath over the last few days. Hx DM on no meds at home. Results for MYKAL, KIRCHMAN (MRN [id number]) as of 02/09/2013 10:30  Ref. Range 02/08/2013 12:50  Hemoglobin A1C Latest Range: <5.7 % 6.9 (H)  Results for TISHIE, ALTMANN (MRN [id number]) as of 02/09/2013 10:30  Ref. Range 02/08/2013 12:01 02/08/2013 16:46 02/08/2013 20:59 02/09/2013 00:12 02/09/2013 08:01  Glucose-Capillary Latest Range: 70-99 mg/dL 629 (H) 528 (H) 413 (H) 182 (H) 215 (H)   Likely steroid induced hyperglycemia.   Inpatient Diabetes Program Recommendations Insulin - Meal Coverage: May benefit from addition of meal coverage insulin while on steroids - Novolog 3 units tidwc if pt eats > 50% meal Outpatient Referral: Would benefit from OP Diabetes Education consult - will discuss with pt Diet: Please add CHO mod med to heart healthy diet   Note: Will continue to follow.  Thank you.  Ailene Ards, RD, LDN, CDE Inpatient Diabetes Coordinator [phone]

## 2013-02-09 NOTE — Discharge Summary (Signed)
Physician Discharge Summary  [name] [name] ZOX:[id number] DOB: 1976-06-24 DOA: 02/08/2013  PCP: No Pcp  Admit date: 02/08/2013 Discharge date: 02/09/2013  Recommendations for Outpatient Follow-up:  1. Encouraged to follow up with ENT for consideration of sinus surgery.  Discharge Diagnoses:  Principal Problem:    Acute asthma exacerbation Active Problems:    Bronchitis    HTN (hypertension), malignant    Chronic anemia / iron deficiency anemia    Hyperglycemia    Obstructive sleep apnea    Sinusitis, chronic    Type II diabetes mellitus    Morbid obesity   Discharge Condition: Improved.  Diet recommendation: Carbohydrate modified, low sodium, heart healthy.  History of present illness:  [name] [name] is an 37 y.o. female with a past medical history of bronchial asthma, hypertension, and chronic anemia who was admitted to the hospital 02/08/13 with worsening dyspnea secondary to an acute asthma exacerbation in the setting of chronic sinusitis.   Hospital Course by problem:  Principal Problem:  Acute asthma exacerbation / bronchitis / chronic sinusitis  -Patient was admitted to telemetry and placed on IV steroids, nebulized bronchodilator treatment, and Pulmicort. Bronchospasm improved within 24 hours.  Send home on prednisone taper. -Empiric azithromycin ordered on admission, which was switched toLevaquin for broader coverage given her chronic sinusitis. Continue Flonase.  -Encouraged to F/U with ENT for consideration of endoscopic sinus surgery. -Encouraged to use neti pot for sinus rinses TID. Active Problems:  Obstructive sleep apnea / Morbid obesity  -Not on CPAP. Follow up with PCP at [facility].  -Weight loss encouraged. HTN (hypertension), malignant  -Reasonable control on HCTZ/Avapro.  Chronic anemia / iron deficiency anemia  -Continue iron supplementation. Likely due to menstrual losses.  Hyperglycemia / DM  -Hemoglobin A1c 6.9%, has metformin at home  to take when she is on steroids. Maintained on sliding scale insulin while in the hospital. Recommend carbohydrate diet. Hypokalemia  -Secondary to hydrochlorothiazide. Potassium replaced. Magnesium levels within normal limits.   Procedures:  None.  Consultations:  None.  Discharge Exam: Filed Vitals:   02/09/13 1344  BP: 141/89  Pulse: 115  Temp: 97.9 F (36.6 C)  Resp: 18   Filed Vitals:   02/09/13 0741 02/09/13 1055 02/09/13 1344 02/09/13 1516  BP:  138/80 141/89   Pulse:  101 115   Temp:   97.9 F (36.6 C)   TempSrc:   Oral   Resp:   18   Height:      Weight:      SpO2: 100%  97% 100%    Gen:  NAD Cardiovascular:  RRR, No M/R/G Respiratory: Lungs with some high pitched wheezes, reasonable air movement. Gastrointestinal: Abdomen soft, NT/ND with normal active bowel sounds. Extremities: Trace edema.   Discharge Instructions  Discharge Orders   Future Orders Complete By Expires     Activity as tolerated - No restrictions  As directed     Call MD for:  persistant nausea and vomiting  As directed     Call MD for:  temperature >100.4  As directed     Call MD for:  As directed     Scheduling Instructions:      Worsening wheezing or shortness of breath.    Diet - low sodium heart healthy  As directed     Diet Carb Modified  As directed     Discharge instructions  As directed     Comments:      Use neti pot or saline spray to rinse your sinuses  three times a day.        Medication List    TAKE these medications       albuterol (2.5 MG/3ML) 0.083% nebulizer solution  Commonly known as:  PROVENTIL  Take 2.5 mg by nebulization every 4 (four) hours as needed for wheezing or shortness of breath. For asthma or shortness of breath.     budesonide-formoterol 160-4.5 MCG/ACT inhaler  Commonly known as:  SYMBICORT  Inhale 2 puffs into the lungs 2 (two) times daily.     diphenhydrAMINE 25 mg capsule  Commonly known as:  BENADRYL  Take 25 mg by mouth daily as  needed. For allergies.     ferrous sulfate 325 (65 FE) MG tablet  Take 325 mg by mouth daily with breakfast.     fluticasone 50 MCG/ACT nasal spray  Commonly known as:  FLONASE  Place 2 sprays into the nose daily.     folic acid 400 MCG tablet  Commonly known as:  FOLVITE  Take 400 mcg by mouth every morning.     ipratropium-albuterol 0.5-2.5 (3) MG/3ML Soln  Commonly known as:  DUONEB  Take 3 mLs by nebulization every 4 (four) hours as needed (for wheezing or shortness of breath). For wheezing or shortness of breath     irbesartan-hydrochlorothiazide 300-25 MG per tablet  Commonly known as:  AVALIDE  Take 1 tablet by mouth every morning.     levofloxacin 500 MG tablet  Commonly known as:  LEVAQUIN  Take 1 tablet (500 mg total) by mouth daily.     mupirocin ointment 2 %  Commonly known as:  BACTROBAN  Apply 1 application topically 2 (two) times daily. Apply with a Q tip inside nasal passages.     Potassium 99 MG Tabs  Take 4 tablets by mouth every morning.     predniSONE 10 MG tablet  Commonly known as:  STERAPRED UNI-PAK  Take 6 pills 02/10/13 and decrease by 10 mg daily until off.     ranitidine 150 MG capsule  Commonly known as:  ZANTAC  Take 600 mg by mouth every morning. For heartburn           Follow-up Information   Follow up with [facility] Schedule an appointment as soon as possible for a visit in 1 week.   Contact information:   [address] [place] [address] [phone]       The results of significant diagnostics from this hospitalization (including imaging, microbiology, ancillary and laboratory) are listed below for reference.    Significant Diagnostic Studies: Dg Chest 2 View  02/08/2013  *RADIOLOGY REPORT*  Clinical Data: Asthma, wheezing  CHEST - 2 VIEW  Comparison: 12/20/2012  Findings: Mild central peribronchial thickening.  No confluent airspace opacity, pleural effusion, or pneumothorax.  Cardiomediastinal contours within normal range.  No acute osseous finding.  IMPRESSION: Central peribronchial thickening, in keeping with bronchitis or reactive airway disease.  No focal consolidation   Original Report Authenticated By: Jearld Lesch, M.D.     Labs:  Basic Metabolic Panel:  Recent Labs Lab 02/08/13 0350 02/08/13 1200 02/09/13 0450  NA 137 135 135  K 2.8* 4.1 3.8  CL 102 100 104  CO2 23 19 19   GLUCOSE 145* 322* 236*  BUN 8 7 12   CREATININE 0.73 0.65 0.76  CALCIUM 8.9 8.7 9.4  MG  --  2.0  --    GFR Estimated Creatinine Clearance: 137.4 ml/min (by C-G  formula based on Cr of 0.76).  CBC:  Recent Labs Lab 02/08/13 0350 02/09/13 0450  WBC 8.2 11.8*  NEUTROABS 3.2  --   HGB 8.0* 7.7*  HCT 28.5* 27.0*  MCV 61.4* 61.6*  PLT 377 346   CBG:  Recent Labs Lab 02/08/13 1646 02/08/13 2059 02/09/13 0012 02/09/13 0801 02/09/13 1209  GLUCAP 258* 209* 182* 215* 249*   Hgb A1c  Recent Labs  02/08/13 1250  HGBA1C 6.9*   Thyroid function studies  Recent Labs  02/08/13 1250  TSH 0.551   Microbiology Recent Results (from the past 240 hour(s))  MRSA PCR SCREENING     Status: Abnormal   Collection Time    02/08/13  9:08 PM      Result Value Range Status   MRSA by PCR POSITIVE (*) NEGATIVE Final   Comment: RESULT CALLED TO, READ BACK BY AND VERIFIED WITH:     B SHAW AT 2350 ON 03.13.2014 BY NBROOKS                The GeneXpert MRSA Assay (FDA     approved for NASAL specimens     only), is one component of a     comprehensive MRSA colonization     surveillance program. It is not     intended to diagnose MRSA     infection nor to guide or     monitor treatment for     MRSA infections.    Time coordinating discharge: 35 minutes.  Signed:  RAMA,CHRISTINA  Pager [phone] [facility] 02/09/2013, 4:10 PM

## 2013-02-09 NOTE — Progress Notes (Signed)
Pt is very anxious and is talking constantly during her treatments. Tried to explain to her that she needs to try and relax. Pt says that it helps her when she is talking to her daughter.

## 2013-02-09 NOTE — Progress Notes (Signed)
   CARE MANAGEMENT NOTE 02/09/2013  Patient:  [name] [name],[name] [name]   Account Number:  [id number]  Date Initiated:  02/09/2013  Documentation initiated by:  Jiles Crocker  Subjective/Objective Assessment:   ADMITTED WITH ASTHMA EXACERBATION     Action/Plan:   LIVES AT HOME WITH FAMILY;   Anticipated DC Date:  02/12/2013   Anticipated DC Plan:  HOME/SELF CARE      DC Planning Services  CM consult  Status of service:  In process, will continue to follow Medicare Important Message given?  NA - LOS <3 / Initial given by admissions (If response is "NO", the following Medicare IM given date fields will be blank) Per UR Regulation:  Reviewed for med. necessity/level of care/duration of stay Comments:  02/09/2013- B Willey Due RN,BSN,MHA

## 2013-02-15 IMAGING — CR DG CHEST 1V PORT
1 series · 1 of 1 positions shown · non-contrast
Comparison: Chest radiograph performed 04/29/2011

CLINICAL DATA: Shortness of breath; history of asthma and diabetes.

PORTABLE CHEST - 1 VIEW

[AP]
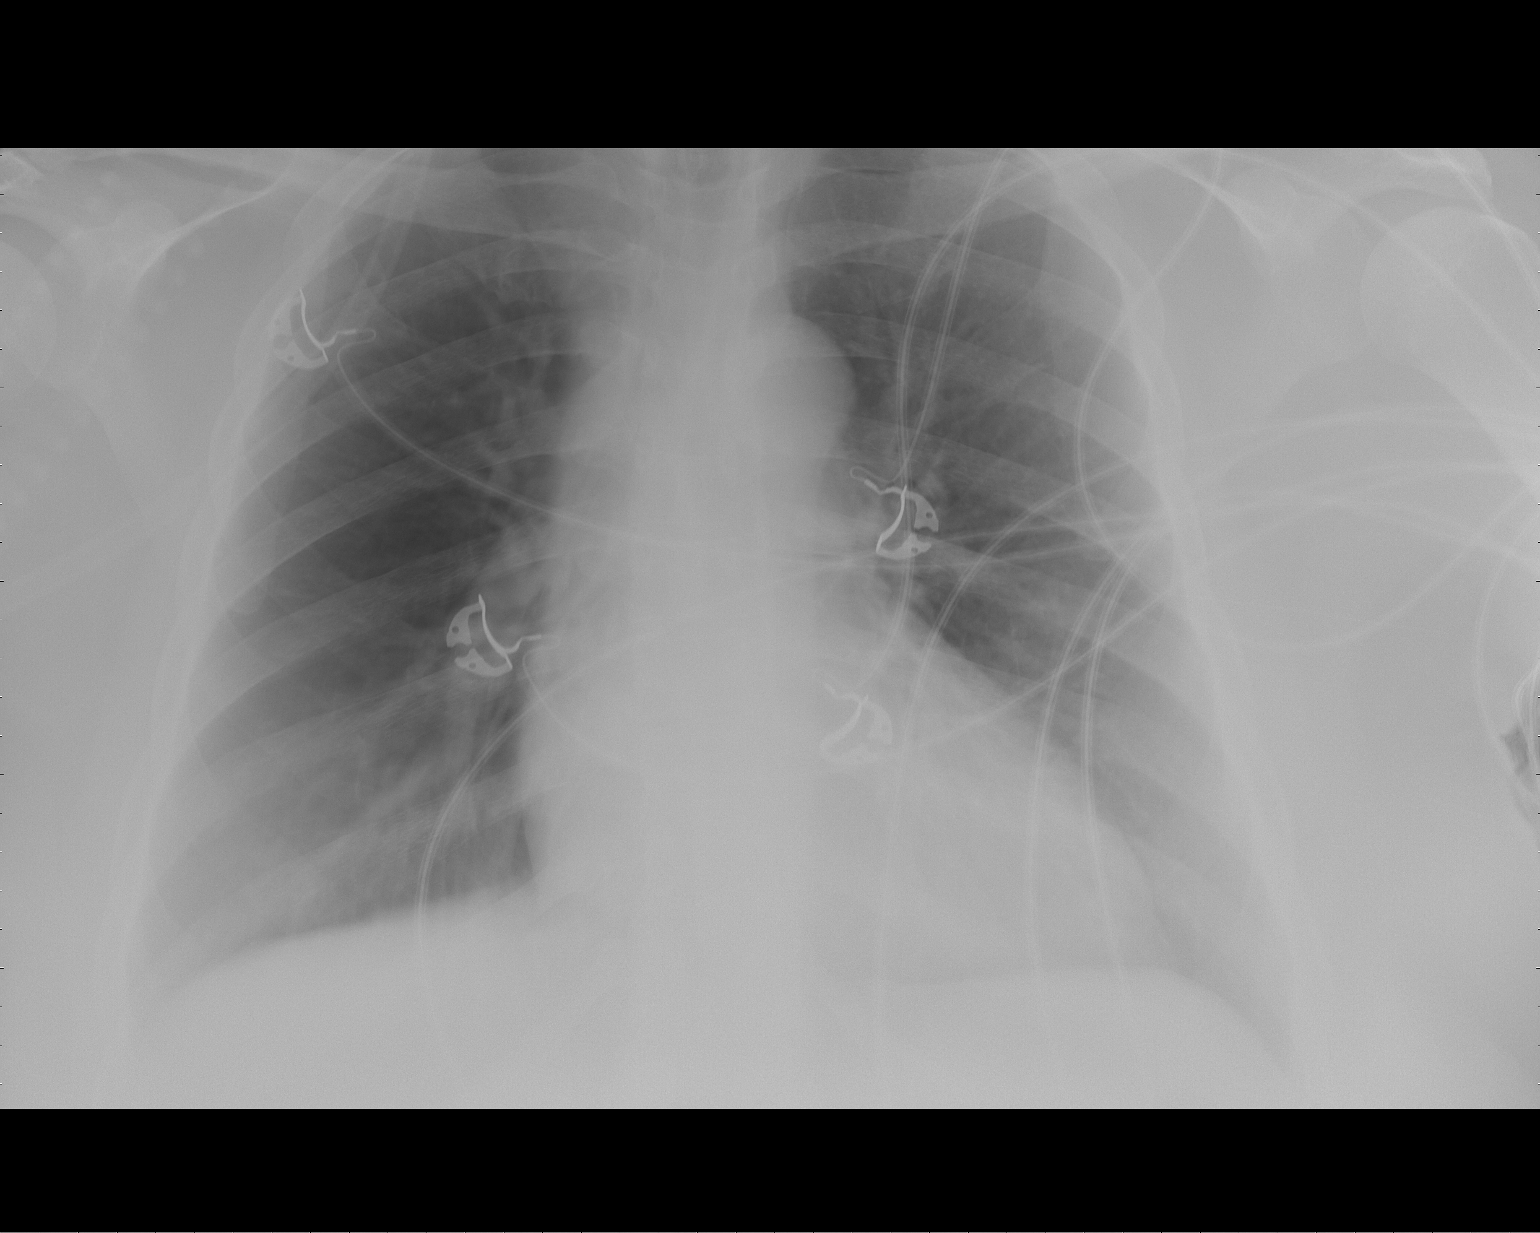

[1 of 1 positions shown; findings below may reference images not displayed]

FINDINGS: The lungs are well-aerated and clear.  There is no
evidence of focal opacification, pleural effusion or pneumothorax.
Evaluation is suboptimal due to motion artifact.

The cardiomediastinal silhouette is within normal limits.  No acute
osseous abnormalities are seen.
IMPRESSION: No acute cardiopulmonary process seen; evaluation suboptimal due to
motion artifact.

## 2013-02-24 ENCOUNTER — Emergency Department (HOSPITAL_COMMUNITY): Payer: Medicare Other

## 2013-02-24 ENCOUNTER — Emergency Department (HOSPITAL_COMMUNITY)
Admission: EM | Admit: 2013-02-24 | Discharge: 2013-02-24 | Disposition: A | Discharge status: Home / Self Care | Payer: Medicare Other | Attending: Emergency Medicine | Admitting: Emergency Medicine

## 2013-02-24 ENCOUNTER — Encounter (HOSPITAL_COMMUNITY): Payer: Self-pay

## 2013-02-24 DIAGNOSIS — Z8701 Personal history of pneumonia (recurrent): Secondary | ICD-10-CM | POA: Insufficient documentation

## 2013-02-24 DIAGNOSIS — R5381 Other malaise: Secondary | ICD-10-CM | POA: Insufficient documentation

## 2013-02-24 DIAGNOSIS — R0602 Shortness of breath: Secondary | ICD-10-CM | POA: Insufficient documentation

## 2013-02-24 DIAGNOSIS — Z79899 Other long term (current) drug therapy: Secondary | ICD-10-CM | POA: Insufficient documentation

## 2013-02-24 DIAGNOSIS — Z8659 Personal history of other mental and behavioral disorders: Secondary | ICD-10-CM | POA: Insufficient documentation

## 2013-02-24 DIAGNOSIS — I10 Essential (primary) hypertension: Secondary | ICD-10-CM | POA: Insufficient documentation

## 2013-02-24 DIAGNOSIS — D649 Anemia, unspecified: Secondary | ICD-10-CM | POA: Insufficient documentation

## 2013-02-24 DIAGNOSIS — Z8669 Personal history of other diseases of the nervous system and sense organs: Secondary | ICD-10-CM | POA: Insufficient documentation

## 2013-02-24 DIAGNOSIS — IMO0002 Reserved for concepts with insufficient information to code with codable children: Secondary | ICD-10-CM | POA: Insufficient documentation

## 2013-02-24 DIAGNOSIS — Z8719 Personal history of other diseases of the digestive system: Secondary | ICD-10-CM | POA: Insufficient documentation

## 2013-02-24 DIAGNOSIS — Z8709 Personal history of other diseases of the respiratory system: Secondary | ICD-10-CM | POA: Insufficient documentation

## 2013-02-24 DIAGNOSIS — K219 Gastro-esophageal reflux disease without esophagitis: Secondary | ICD-10-CM | POA: Insufficient documentation

## 2013-02-24 DIAGNOSIS — R0789 Other chest pain: Secondary | ICD-10-CM | POA: Insufficient documentation

## 2013-02-24 DIAGNOSIS — E119 Type 2 diabetes mellitus without complications: Secondary | ICD-10-CM | POA: Insufficient documentation

## 2013-02-24 DIAGNOSIS — Z87448 Personal history of other diseases of urinary system: Secondary | ICD-10-CM | POA: Insufficient documentation

## 2013-02-24 DIAGNOSIS — J45901 Unspecified asthma with (acute) exacerbation: Secondary | ICD-10-CM | POA: Insufficient documentation

## 2013-02-24 MED ORDER — PREDNISONE 20 MG PO TABS: ORAL_TABLET | ORAL | Status: DC

## 2013-02-24 MED ORDER — PREDNISONE 20 MG PO TABS
60.0000 mg | ORAL_TABLET | Freq: Once | ORAL | Status: AC
  Administered 2013-02-24: 60 mg via ORAL
  Filled 2013-02-24: qty 3

## 2013-02-24 MED ORDER — ALBUTEROL SULFATE (5 MG/ML) 0.5% IN NEBU
5.0000 mg | INHALATION_SOLUTION | Freq: Once | RESPIRATORY_TRACT | Status: AC
  Administered 2013-02-24: 5 mg via RESPIRATORY_TRACT
  Filled 2013-02-24: qty 1

## 2013-02-24 NOTE — ED Provider Notes (Signed)
History     CSN: [id number]  Arrival date & time 02/24/13  1737   First MD Initiated Contact with Patient 02/24/13 1747      Chief Complaint  Patient presents with  . Asthma    (Consider location/radiation/quality/duration/timing/severity/associated sxs/prior treatment) HPI Comments: Patient history of asthma presents with wheezing and shortness of breath consistent with her past asthma exacerbations. She states that she has trouble daily with her asthma and has chronic postnasal drip. She states over the last couple days she's had worsening chest congestion and today she used her nebulizer treatment at home and her symptoms did not improve likely typically do. It was for this reason that she came to the ED. She does complain of some chest congestion with productive cough. She denies any known fevers at home. She denies any leg pain or swelling. She denies any pleuritic-type pain. She was given a DuoNeb by a EMS. She does state that she feels better after the DuoNeb.  Patient is a 37 y.o. female presenting with asthma.  Asthma Associated symptoms include shortness of breath. Pertinent negatives include no chest pain, no abdominal pain and no headaches.    Past Medical History  Diagnosis Date  . Asthma   . Hypertension   . Chronic anemia   . Abnormal TSH   . Morbid obesity   . GERD (gastroesophageal reflux disease)   . Seasonal allergies   . Nasal polyps   . Diabetes mellitus     type 2  . Chronic bronchitis   . Sleep apnea   . Shortness of breath 12/14/11    "related to how bad my asthma is; sometimes @ rest, lying down, w/exertion"  . Pneumonia   . Blood transfusion   . Inguinal hernia unilateral, non-recurrent     right  . Kidney infection 2008  . Headache   . Anxiety   . Depression   . PTSD (post-traumatic stress disorder)     "related to prior hospitalizations; things that happened when I was in hospital"    Past Surgical History  Procedure Laterality Date  .  Cervical cerclage  1999; 2002  . Tubal ligation  2002    Family History  Problem Relation Age of Onset  . Diabetes Mother   . Heart disease Father     History  Substance Use Topics  . Smoking status: Never Smoker   . Smokeless tobacco: Never Used  . Alcohol Use: No     Comment: 12/14/11 "only on birthdays; parties, and such"    OB History   Grav Para Term Preterm Abortions TAB SAB Ect Mult Living                  Review of Systems  Constitutional: Positive for fatigue. Negative for fever, chills and diaphoresis.  HENT: Negative for congestion, rhinorrhea and sneezing.   Eyes: Negative.   Respiratory: Positive for cough, chest tightness, shortness of breath and wheezing.   Cardiovascular: Negative for chest pain and leg swelling.  Gastrointestinal: Negative for nausea, vomiting, abdominal pain, diarrhea and blood in stool.  Genitourinary: Negative for frequency, hematuria, flank pain and difficulty urinating.  Musculoskeletal: Negative for back pain and arthralgias.  Skin: Negative for rash.  Neurological: Negative for dizziness, speech difficulty, weakness, numbness and headaches.    Allergies  Aspirin; Ibuprofen; Shellfish allergy; Menthol; and Other  Home Medications   Current Outpatient Rx  Name  Route  Sig  Dispense  Refill  . albuterol (PROVENTIL) (2.5 MG/3ML) 0.083%  nebulizer solution   Nebulization   Take 2.5 mg by nebulization every 4 (four) hours as needed for wheezing or shortness of breath. For asthma or shortness of breath.         . budesonide-formoterol (SYMBICORT) 160-4.5 MCG/ACT inhaler   Inhalation   Inhale 2 puffs into the lungs 2 (two) times daily.         . diphenhydrAMINE (BENADRYL) 25 mg capsule   Oral   Take 25 mg by mouth daily as needed. For allergies.         . ferrous sulfate 325 (65 FE) MG tablet   Oral   Take 325 mg by mouth daily with breakfast.         . fluticasone (FLONASE) 50 MCG/ACT nasal spray   Nasal   Place 2  sprays into the nose daily.          . folic acid (FOLVITE) 400 MCG tablet   Oral   Take 400 mcg by mouth every morning.          Marland Kitchen ipratropium-albuterol (DUONEB) 0.5-2.5 (3) MG/3ML SOLN   Nebulization   Take 3 mLs by nebulization every 4 (four) hours as needed (for wheezing or shortness of breath). For wheezing or shortness of breath         . irbesartan-hydrochlorothiazide (AVALIDE) 300-25 MG per tablet   Oral   Take 1 tablet by mouth every morning.          . mupirocin ointment (BACTROBAN) 2 %   Topical   Apply 1 application topically 2 (two) times daily. Apply with a Q tip inside nasal passages.   22 g   0   . Potassium 99 MG TABS   Oral   Take 4 tablets by mouth every morning.         . ranitidine (ZANTAC) 150 MG capsule   Oral   Take 600 mg by mouth every morning. For heartburn         . predniSONE (DELTASONE) 20 MG tablet      Take 2 po daily x 5 days   10 tablet   0     BP 147/83  Pulse 105  Temp(Src) 98.1 F (36.7 C) (Oral)  Resp 24  SpO2 95%  LMP 02/22/2013  Physical Exam  Constitutional: She is oriented to person, place, and time. She appears well-developed and well-nourished.  HENT:  Head: Normocephalic and atraumatic.  Eyes: Pupils are equal, round, and reactive to light.  Neck: Normal range of motion. Neck supple.  Cardiovascular: Normal rate, regular rhythm and normal heart sounds.   Pulmonary/Chest: Effort normal. No respiratory distress. She has wheezes (Bilateral expiratory wheezing. Mild tachypnea. No retractions or increased work of breathing.). She has no rales. She exhibits no tenderness.  Abdominal: Soft. Bowel sounds are normal. There is no tenderness. There is no rebound and no guarding.  Musculoskeletal: Normal range of motion. She exhibits no edema.  No calf tenderness  Lymphadenopathy:    She has no cervical adenopathy.  Neurological: She is alert and oriented to person, place, and time.  Skin: Skin is warm and dry. No  rash noted.  Psychiatric: She has a normal mood and affect.    ED Course  Procedures (including critical care time)  Results for orders placed during the hospital encounter of 02/24/13  GLUCOSE, CAPILLARY      Result Value Range   Glucose-Capillary 153 (*) 70 - 99 mg/dL   Dg Chest 2 View  1/61/0960  *  RADIOLOGY REPORT*  Clinical Data:  Cough, wheezing, history asthma, hypertension  CHEST - 2 VIEW  Comparison: 01/11/2013  Findings: Upper-normal size of cardiac silhouette. Mediastinal contours and pulmonary vascularity normal. Peribronchial thickening, chronic. No acute infiltrate, pleural effusion or pneumothorax. Bones unremarkable.  IMPRESSION: Chronic bronchitic changes.   Original Report Authenticated By: Ulyses Southward, M.D.    Dg Chest 2 View  02/08/2013  *RADIOLOGY REPORT*  Clinical Data: Asthma, wheezing  CHEST - 2 VIEW  Comparison: 12/20/2012  Findings: Mild central peribronchial thickening.  No confluent airspace opacity, pleural effusion, or pneumothorax. Cardiomediastinal contours within normal range.  No acute osseous finding.  IMPRESSION: Central peribronchial thickening, in keeping with bronchitis or reactive airway disease.  No focal consolidation   Original Report Authenticated By: Jearld Lesch, M.D.        Date: 02/24/2013  Rate: 120  Rhythm: sinus tachycardia  QRS Axis: normal  Intervals: normal  ST/T Wave abnormalities: nonspecific ST/T changes  Conduction Disutrbances:none  Narrative Interpretation:   Old EKG Reviewed: unchanged   1. Asthma exacerbation       MDM  Patient was given one more albuterol nebulizer treatment in the emergency department as well as a dose of prednisone. She was feeling much better after this and after a period of observation felt like she was ready to go home. Her lung exam was much improved. Her chest x-ray did not show any evidence of pneumonia.        Rolan Bucco, MD 02/24/13 2258

## 2013-02-24 NOTE — ED Notes (Signed)
HQI:ON62<XB> Expected date:02/24/13<BR> Expected time:<BR> Means of arrival:<BR> Comments:<BR> 36 SOB

## 2013-02-24 NOTE — ED Notes (Signed)
Pt presents with Asthma- Wheezing multiple breathing TX by pt before EMS arrival- breathing TX albuterol and atrovent performed by EMS in route.  Pt alert and active with care upon arrival.  RRT call for evaluation on this pt upon arrival to ED.  Becca RN at [facility] to evaluate this p.  Charge [place] RN aware of pt status.

## 2013-02-24 NOTE — ED Notes (Signed)
Pt states she is allergic to our thermometer disposable sheaths and she normally uses the disposable thermometers here. One is placed at her bedside for future use.

## 2013-02-26 ENCOUNTER — Emergency Department (HOSPITAL_COMMUNITY)
Admission: EM | Admit: 2013-02-26 | Discharge: 2013-02-27 | Disposition: A | Discharge status: Home / Self Care | Payer: Medicare Other | Attending: Emergency Medicine | Admitting: Emergency Medicine

## 2013-02-26 ENCOUNTER — Emergency Department (HOSPITAL_COMMUNITY): Payer: Medicare Other

## 2013-02-26 ENCOUNTER — Encounter (HOSPITAL_COMMUNITY): Payer: Self-pay | Admitting: Emergency Medicine

## 2013-02-26 DIAGNOSIS — F411 Generalized anxiety disorder: Secondary | ICD-10-CM | POA: Insufficient documentation

## 2013-02-26 DIAGNOSIS — Z8639 Personal history of other endocrine, nutritional and metabolic disease: Secondary | ICD-10-CM | POA: Insufficient documentation

## 2013-02-26 DIAGNOSIS — Z8709 Personal history of other diseases of the respiratory system: Secondary | ICD-10-CM | POA: Insufficient documentation

## 2013-02-26 DIAGNOSIS — I10 Essential (primary) hypertension: Secondary | ICD-10-CM | POA: Insufficient documentation

## 2013-02-26 DIAGNOSIS — E119 Type 2 diabetes mellitus without complications: Secondary | ICD-10-CM | POA: Insufficient documentation

## 2013-02-26 DIAGNOSIS — J45901 Unspecified asthma with (acute) exacerbation: Secondary | ICD-10-CM | POA: Insufficient documentation

## 2013-02-26 DIAGNOSIS — Z8701 Personal history of pneumonia (recurrent): Secondary | ICD-10-CM | POA: Insufficient documentation

## 2013-02-26 DIAGNOSIS — Z79899 Other long term (current) drug therapy: Secondary | ICD-10-CM | POA: Insufficient documentation

## 2013-02-26 DIAGNOSIS — IMO0002 Reserved for concepts with insufficient information to code with codable children: Secondary | ICD-10-CM | POA: Insufficient documentation

## 2013-02-26 DIAGNOSIS — F329 Major depressive disorder, single episode, unspecified: Secondary | ICD-10-CM | POA: Insufficient documentation

## 2013-02-26 DIAGNOSIS — F3289 Other specified depressive episodes: Secondary | ICD-10-CM | POA: Insufficient documentation

## 2013-02-26 DIAGNOSIS — Z8719 Personal history of other diseases of the digestive system: Secondary | ICD-10-CM | POA: Insufficient documentation

## 2013-02-26 DIAGNOSIS — F4310 Post-traumatic stress disorder, unspecified: Secondary | ICD-10-CM | POA: Insufficient documentation

## 2013-02-26 DIAGNOSIS — D638 Anemia in other chronic diseases classified elsewhere: Secondary | ICD-10-CM | POA: Insufficient documentation

## 2013-02-26 DIAGNOSIS — G4730 Sleep apnea, unspecified: Secondary | ICD-10-CM | POA: Insufficient documentation

## 2013-02-26 DIAGNOSIS — Z862 Personal history of diseases of the blood and blood-forming organs and certain disorders involving the immune mechanism: Secondary | ICD-10-CM | POA: Insufficient documentation

## 2013-02-26 DIAGNOSIS — K219 Gastro-esophageal reflux disease without esophagitis: Secondary | ICD-10-CM | POA: Insufficient documentation

## 2013-02-26 MED ORDER — IPRATROPIUM BROMIDE 0.02 % IN SOLN
0.5000 mg | Freq: Once | RESPIRATORY_TRACT | Status: AC
  Administered 2013-02-26: 0.5 mg via RESPIRATORY_TRACT
  Filled 2013-02-26: qty 2.5

## 2013-02-26 MED ORDER — PREDNISONE 20 MG PO TABS
60.0000 mg | ORAL_TABLET | Freq: Once | ORAL | Status: AC
Start: 2013-02-26 — End: 2013-02-26
  Administered 2013-02-26: 60 mg via ORAL
  Filled 2013-02-26: qty 3

## 2013-02-26 MED ORDER — ALBUTEROL (5 MG/ML) CONTINUOUS INHALATION SOLN
10.0000 mg/h | INHALATION_SOLUTION | Freq: Once | RESPIRATORY_TRACT | Status: AC
  Administered 2013-02-26: 10 mg/h via RESPIRATORY_TRACT
  Filled 2013-02-26: qty 20

## 2013-02-26 NOTE — Progress Notes (Signed)
Pre peak flow 220

## 2013-02-26 NOTE — ED Notes (Signed)
Pt states that she has nasal polyps and the drainage gets blocked and causes her asthma to attack up.  88-89 on RA. Pt given two albuterol treatments by EMS.  Pt has audible wheezes.  Pt has PO prednisone at home and has home neb treatments which helped yesterday but the meds didn't help her today.  Pt states she has had fever and greenish sputum.  Pt states that her sugar level has been elevated due to the prednisone.

## 2013-02-27 MED ORDER — ALBUTEROL SULFATE HFA 108 (90 BASE) MCG/ACT IN AERS: 1.0000 | INHALATION_SPRAY | Freq: Four times a day (QID) | RESPIRATORY_TRACT | Status: DC | PRN

## 2013-02-27 MED ORDER — PREDNISONE 50 MG PO TABS: 50.0000 mg | ORAL_TABLET | Freq: Every day | ORAL | Status: DC

## 2013-02-27 MED ORDER — ALBUTEROL SULFATE (5 MG/ML) 0.5% IN NEBU
5.0000 mg | INHALATION_SOLUTION | Freq: Once | RESPIRATORY_TRACT | Status: AC
  Administered 2013-02-27: 5 mg via RESPIRATORY_TRACT
  Filled 2013-02-27: qty 1

## 2013-02-27 MED ORDER — IPRATROPIUM BROMIDE HFA 17 MCG/ACT IN AERS
2.0000 | INHALATION_SPRAY | Freq: Once | RESPIRATORY_TRACT | Status: AC
  Administered 2013-02-27: 2 via RESPIRATORY_TRACT
  Filled 2013-02-27: qty 12.9

## 2013-02-27 NOTE — ED Provider Notes (Signed)
1:37 AM Patient feels much better at this time.  Discharge home in good condition.  Patient continues to have some tachycardia this is likely secondary to her albuterol.  The encounter diagnosis was Asthma exacerbation.  Dg Chest 2 View  02/24/2013  *RADIOLOGY REPORT*  Clinical Data:  Cough, wheezing, history asthma, hypertension  CHEST - 2 VIEW  Comparison: 01/11/2013  Findings: Upper-normal size of cardiac silhouette. Mediastinal contours and pulmonary vascularity normal. Peribronchial thickening, chronic. No acute infiltrate, pleural effusion or pneumothorax. Bones unremarkable.  IMPRESSION: Chronic bronchitic changes.   Original Report Authenticated By: Ulyses Southward, M.D.    Dg Chest 2 View  02/08/2013  *RADIOLOGY REPORT*  Clinical Data: Asthma, wheezing  CHEST - 2 VIEW  Comparison: 12/20/2012  Findings: Mild central peribronchial thickening.  No confluent airspace opacity, pleural effusion, or pneumothorax. Cardiomediastinal contours within normal range.  No acute osseous finding.  IMPRESSION: Central peribronchial thickening, in keeping with bronchitis or reactive airway disease.  No focal consolidation   Original Report Authenticated By: Jearld Lesch, M.D.    Dg Chest Port 1 View  02/26/2013  *RADIOLOGY REPORT*  Clinical Data: Asthma exacerbation with shortness of breath.  PORTABLE CHEST - 1 VIEW 02/26/2013 2135 hours:  Comparison: Two-view chest x-ray 02/24/2013, 02/08/2013, 12/20/2012, 11/26/2012, 10/29/2011.  Findings: Cardiac silhouette upper normal in size for AP portable technique, unchanged.  Hilar and mediastinal contours otherwise unremarkable.  Mild central peribronchial thickening, unchanged. No new pulmonary parenchymal abnormalities.  IMPRESSION: Stable mild changes of chronic bronchitis and/or asthma.  No acute cardiopulmonary disease.   Original Report Authenticated By: Hulan Saas, M.D.    I personally reviewed the imaging tests through PACS system I reviewed available  ER/hospitalization records through the EMR   Lyanne Co, MD 02/27/13 [phone]

## 2013-03-10 NOTE — ED Provider Notes (Addendum)
History     CSN: [id number]  Arrival date & time 02/26/13  1714   First MD Initiated Contact with Patient 02/26/13 1946      Chief Complaint  Patient presents with  . Asthma    (Consider location/radiation/quality/duration/timing/severity/associated sxs/prior treatment) HPI Comments: PT WITH HX OF SEVERE ASTHMA, NO INTUBATIONS. Comes in with wheezing and dib. Unknown trigger. Pt has a cough - non productive.  Patient is a 37 y.o. female presenting with asthma. The history is provided by the patient and medical records.  Asthma This is a recurrent problem. The current episode started 6 to 12 hours ago. The problem occurs constantly. The problem has been gradually worsening. Associated symptoms include shortness of breath. Pertinent negatives include no chest pain, no abdominal pain and no headaches. The symptoms are aggravated by smoking. The symptoms are relieved by medications.    Past Medical History  Diagnosis Date  . Asthma   . Hypertension   . Chronic anemia   . Abnormal TSH   . Morbid obesity   . GERD (gastroesophageal reflux disease)   . Seasonal allergies   . Nasal polyps   . Diabetes mellitus     type 2  . Chronic bronchitis   . Sleep apnea   . Shortness of breath 12/14/11    "related to how bad my asthma is; sometimes @ rest, lying down, w/exertion"  . Pneumonia   . Blood transfusion   . Inguinal hernia unilateral, non-recurrent     right  . Kidney infection 2008  . Headache   . Anxiety   . Depression   . PTSD (post-traumatic stress disorder)     "related to prior hospitalizations; things that happened when I was in hospital"    Past Surgical History  Procedure Laterality Date  . Cervical cerclage  1999; 2002  . Tubal ligation  2002    Family History  Problem Relation Age of Onset  . Diabetes Mother   . Heart disease Father     History  Substance Use Topics  . Smoking status: Never Smoker   . Smokeless tobacco: Never Used  . Alcohol Use: No      Comment: 12/14/11 "only on birthdays; parties, and such"    OB History   Grav Para Term Preterm Abortions TAB SAB Ect Mult Living                  Review of Systems  Constitutional: Positive for activity change.  HENT: Negative for neck pain.   Respiratory: Positive for shortness of breath and wheezing.   Cardiovascular: Negative for chest pain.  Gastrointestinal: Negative for nausea, vomiting and abdominal pain.  Genitourinary: Negative for dysuria.  Neurological: Negative for headaches.    Allergies  Aspirin; Ibuprofen; Shellfish allergy; Menthol; and Other  Home Medications   Current Outpatient Rx  Name  Route  Sig  Dispense  Refill  . budesonide-formoterol (SYMBICORT) 160-4.5 MCG/ACT inhaler   Inhalation   Inhale 2 puffs into the lungs 2 (two) times daily.         . diphenhydrAMINE (BENADRYL) 25 mg capsule   Oral   Take 25 mg by mouth daily as needed. For allergies.         . ferrous sulfate 325 (65 FE) MG tablet   Oral   Take 325 mg by mouth daily with breakfast.         . fluticasone (FLONASE) 50 MCG/ACT nasal spray   Nasal   Place 2 sprays into  the nose daily.          . folic acid (FOLVITE) 400 MCG tablet   Oral   Take 400 mcg by mouth every morning.          Marland Kitchen ipratropium-albuterol (DUONEB) 0.5-2.5 (3) MG/3ML SOLN   Nebulization   Take 3 mLs by nebulization every 4 (four) hours as needed (for wheezing or shortness of breath). For wheezing or shortness of breath         . irbesartan-hydrochlorothiazide (AVALIDE) 300-25 MG per tablet   Oral   Take 1 tablet by mouth every morning.          . mupirocin ointment (BACTROBAN) 2 %   Topical   Apply 1 application topically 2 (two) times daily. Apply with a Q tip inside nasal passages.   22 g   0   . Potassium 99 MG TABS   Oral   Take 4 tablets by mouth every morning.         . predniSONE (DELTASONE) 20 MG tablet      Take 2 po daily x 5 days         . ranitidine (ZANTAC) 150 MG  capsule   Oral   Take 600 mg by mouth every morning. For heartburn         . albuterol (PROVENTIL HFA;VENTOLIN HFA) 108 (90 BASE) MCG/ACT inhaler   Inhalation   Inhale 1-2 puffs into the lungs every 6 (six) hours as needed for wheezing.   1 Inhaler   0   . predniSONE (DELTASONE) 50 MG tablet   Oral   Take 1 tablet (50 mg total) by mouth daily.   5 tablet   0     BP 139/68  Pulse 134  Temp(Src) 98.4 F (36.9 C) (Oral)  Resp 18  SpO2 99%  LMP 02/22/2013  Physical Exam  Nursing note and vitals reviewed. Constitutional: She is oriented to person, place, and time. She appears well-developed and well-nourished.  HENT:  Head: Normocephalic and atraumatic.  Eyes: EOM are normal. Pupils are equal, round, and reactive to light.  Neck: Neck supple.  Cardiovascular: Regular rhythm and normal heart sounds.   No murmur heard. Pulmonary/Chest: Effort normal. No respiratory distress. She has wheezes. She has no rales.  Abdominal: Soft. She exhibits no distension. There is no tenderness. There is no rebound and no guarding.  Neurological: She is alert and oriented to person, place, and time.  Skin: Skin is warm and dry.    ED Course  Procedures (including critical care time)  Labs Reviewed - No data to display No results found.   1. Asthma exacerbation       MDM  Pt comes in w/ cc of dib. Has hx of asthma, poorly controlled and current sx are consistent with asthma. Will give breathing tx and reassess.  Late entrY: After an hour long tx, patient exams much improved, but she still had some wheezing, so we gave her one more tx and Dr. Patria Mane to take over.    Derwood Kaplan, MD 03/10/13 1610  Dx Status astyhmaticus  Derwood Kaplan, MD 03/10/13 [phone]

## 2013-03-15 ENCOUNTER — Emergency Department (HOSPITAL_COMMUNITY): Payer: Medicare Other

## 2013-03-15 ENCOUNTER — Encounter (HOSPITAL_COMMUNITY): Payer: Self-pay | Admitting: Emergency Medicine

## 2013-03-15 ENCOUNTER — Inpatient Hospital Stay (HOSPITAL_COMMUNITY)
Admission: EM | Admit: 2013-03-15 | Discharge: 2013-03-16 | DRG: 202 | Disposition: A | Discharge status: Home / Self Care | Payer: Medicare Other | Attending: Internal Medicine | Admitting: Internal Medicine

## 2013-03-15 DIAGNOSIS — E876 Hypokalemia: Secondary | ICD-10-CM | POA: Diagnosis present

## 2013-03-15 DIAGNOSIS — D638 Anemia in other chronic diseases classified elsewhere: Secondary | ICD-10-CM | POA: Diagnosis present

## 2013-03-15 DIAGNOSIS — R7309 Other abnormal glucose: Secondary | ICD-10-CM | POA: Diagnosis present

## 2013-03-15 DIAGNOSIS — R739 Hyperglycemia, unspecified: Secondary | ICD-10-CM | POA: Diagnosis present

## 2013-03-15 DIAGNOSIS — J45909 Unspecified asthma, uncomplicated: Secondary | ICD-10-CM | POA: Diagnosis present

## 2013-03-15 DIAGNOSIS — D509 Iron deficiency anemia, unspecified: Secondary | ICD-10-CM | POA: Diagnosis present

## 2013-03-15 DIAGNOSIS — T380X5A Adverse effect of glucocorticoids and synthetic analogues, initial encounter: Secondary | ICD-10-CM | POA: Diagnosis present

## 2013-03-15 DIAGNOSIS — J339 Nasal polyp, unspecified: Secondary | ICD-10-CM | POA: Diagnosis present

## 2013-03-15 DIAGNOSIS — J9600 Acute respiratory failure, unspecified whether with hypoxia or hypercapnia: Secondary | ICD-10-CM | POA: Diagnosis present

## 2013-03-15 DIAGNOSIS — J45901 Unspecified asthma with (acute) exacerbation: Principal | ICD-10-CM

## 2013-03-15 HISTORY — DX: Unspecified asthma, uncomplicated: J45.909

## 2013-03-15 LAB — POCT I-STAT, CHEM 8
Chloride: 105 mEq/L (ref 96–112)
HCT: 29 % — ABNORMAL LOW (ref 36.0–46.0)
Potassium: 3 mEq/L — ABNORMAL LOW (ref 3.5–5.1)

## 2013-03-15 LAB — RETICULOCYTES
RBC.: 4.48 MIL/uL (ref 3.87–5.11)
Retic Count, Absolute: 85.1 10*3/uL (ref 19.0–186.0)

## 2013-03-15 LAB — CBC WITH DIFFERENTIAL/PLATELET
Basophils Absolute: 0 10*3/uL (ref 0.0–0.1)
Eosinophils Relative: 5 % (ref 0–5)
HCT: 29.8 % — ABNORMAL LOW (ref 36.0–46.0)
Lymphocytes Relative: 24 % (ref 12–46)
Monocytes Relative: 7 % (ref 3–12)
Platelets: 193 10*3/uL (ref 150–400)
RBC: 4.88 MIL/uL (ref 3.87–5.11)
RDW: 19.9 % — ABNORMAL HIGH (ref 11.5–15.5)
WBC: 9.3 10*3/uL (ref 4.0–10.5)

## 2013-03-15 MED ORDER — PREDNISONE 20 MG PO TABS
60.0000 mg | ORAL_TABLET | Freq: Once | ORAL | Status: AC
  Administered 2013-03-15: 60 mg via ORAL
  Filled 2013-03-15: qty 3

## 2013-03-15 MED ORDER — ALBUTEROL SULFATE (5 MG/ML) 0.5% IN NEBU: 2.5000 mg | INHALATION_SOLUTION | RESPIRATORY_TRACT | Status: DC | PRN

## 2013-03-15 MED ORDER — TIOTROPIUM BROMIDE MONOHYDRATE 18 MCG IN CAPS
18.0000 ug | ORAL_CAPSULE | Freq: Every day | RESPIRATORY_TRACT | Status: DC
  Administered 2013-03-16: 18 ug via RESPIRATORY_TRACT
  Filled 2013-03-15: qty 5

## 2013-03-15 MED ORDER — ONDANSETRON HCL 4 MG/2ML IJ SOLN: 4.0000 mg | Freq: Four times a day (QID) | INTRAMUSCULAR | Status: DC | PRN

## 2013-03-15 MED ORDER — POTASSIUM CHLORIDE CRYS ER 20 MEQ PO TBCR
40.0000 meq | EXTENDED_RELEASE_TABLET | Freq: Two times a day (BID) | ORAL | Status: AC
  Administered 2013-03-15 (×2): 40 meq via ORAL
  Filled 2013-03-15 (×3): qty 2

## 2013-03-15 MED ORDER — DIPHENHYDRAMINE HCL 25 MG PO CAPS
25.0000 mg | ORAL_CAPSULE | Freq: Every evening | ORAL | Status: DC | PRN
  Administered 2013-03-15: 25 mg via ORAL
  Filled 2013-03-15: qty 1

## 2013-03-15 MED ORDER — ALBUTEROL SULFATE (5 MG/ML) 0.5% IN NEBU
2.5000 mg | INHALATION_SOLUTION | RESPIRATORY_TRACT | Status: DC
  Administered 2013-03-15 – 2013-03-16 (×5): 2.5 mg via RESPIRATORY_TRACT
  Filled 2013-03-15 (×5): qty 0.5

## 2013-03-15 MED ORDER — METHYLPREDNISOLONE SODIUM SUCC 125 MG IJ SOLR
80.0000 mg | Freq: Four times a day (QID) | INTRAMUSCULAR | Status: DC
  Administered 2013-03-15 – 2013-03-16 (×4): 80 mg via INTRAVENOUS
  Filled 2013-03-15 (×2): qty 1.28
  Filled 2013-03-15: qty 2
  Filled 2013-03-15 (×7): qty 1.28

## 2013-03-15 MED ORDER — IPRATROPIUM BROMIDE 0.02 % IN SOLN
0.5000 mg | Freq: Once | RESPIRATORY_TRACT | Status: AC
  Administered 2013-03-15: 0.5 mg via RESPIRATORY_TRACT
  Filled 2013-03-15: qty 2.5

## 2013-03-15 MED ORDER — DEXTROSE 5 % IV SOLN
500.0000 mg | INTRAVENOUS | Status: DC
  Filled 2013-03-15: qty 500

## 2013-03-15 MED ORDER — LEVALBUTEROL HCL 1.25 MG/0.5ML IN NEBU
1.2500 mg | INHALATION_SOLUTION | Freq: Once | RESPIRATORY_TRACT | Status: AC
  Administered 2013-03-15: 1.25 mg via RESPIRATORY_TRACT
  Filled 2013-03-15: qty 0.5

## 2013-03-15 MED ORDER — ONDANSETRON HCL 4 MG PO TABS: 4.0000 mg | ORAL_TABLET | Freq: Four times a day (QID) | ORAL | Status: DC | PRN

## 2013-03-15 MED ORDER — MAGNESIUM SULFATE 50 % IJ SOLN: 2.0000 g | Freq: Once | INTRAMUSCULAR | Status: DC

## 2013-03-15 MED ORDER — LEVALBUTEROL HCL 1.25 MG/3ML IN NEBU: 1.2500 mg | INHALATION_SOLUTION | Freq: Once | RESPIRATORY_TRACT | Status: DC

## 2013-03-15 MED ORDER — HYDROCODONE-ACETAMINOPHEN 5-325 MG PO TABS: 1.0000 | ORAL_TABLET | ORAL | Status: DC | PRN

## 2013-03-15 MED ORDER — SODIUM CHLORIDE 0.9 % IJ SOLN
3.0000 mL | Freq: Two times a day (BID) | INTRAMUSCULAR | Status: DC
  Administered 2013-03-15: 3 mL via INTRAVENOUS

## 2013-03-15 MED ORDER — MAGNESIUM SULFATE 40 MG/ML IJ SOLN
2.0000 g | Freq: Once | INTRAMUSCULAR | Status: AC
  Administered 2013-03-15: 2 g via INTRAVENOUS
  Filled 2013-03-15: qty 50

## 2013-03-15 MED ORDER — ENOXAPARIN SODIUM 30 MG/0.3ML ~~LOC~~ SOLN
30.0000 mg | SUBCUTANEOUS | Status: DC
  Filled 2013-03-15: qty 0.3

## 2013-03-15 NOTE — ED Notes (Signed)
ZOX:[id number]<AV> Expected date:<BR> Expected time:<BR> Means of arrival:<BR> Comments:<BR> 36yo-SOB/asthma

## 2013-03-15 NOTE — ED Notes (Signed)
Per EMS-Pt c/o of SOB that started this am. Pt reports that symptoms are due to weather change. Hx asthma. 2 albertol treatments. Tachy in 140s.

## 2013-03-15 NOTE — ED Provider Notes (Signed)
History     CSN: [id number]  Arrival date & time 03/15/13  0940   First MD Initiated Contact with Patient 03/15/13 [phone]      Chief Complaint  Patient presents with  . Shortness of Breath    (Consider location/radiation/quality/duration/timing/severity/associated sxs/prior treatment) Patient is a 37 y.o. female presenting with shortness of breath. The history is provided by the patient.  Shortness of Breath Severity:  Severe Onset quality:  Gradual Duration:  1 week Timing:  Intermittent Progression:  Worsening Chronicity:  Recurrent Context: activity, pollens, URI and weather changes   Relieved by:  Inhaler (short term improvement with inhaler) Worsened by:  Activity and exertion Associated symptoms: cough   Associated symptoms: no abdominal pain, no chest pain, no fever, no sore throat and no sputum production   Associated symptoms comment:  Ongoing sinus drainage Risk factors comment:  Hx of asthma on spiriva   Past Medical History  Diagnosis Date  . Asthma     History reviewed. No pertinent past surgical history.  No family history on file.  History  Substance Use Topics  . Smoking status: Never Smoker   . Smokeless tobacco: Not on file  . Alcohol Use: No    OB History   Grav Para Term Preterm Abortions TAB SAB Ect Mult Living                  Review of Systems  Constitutional: Negative for fever.  HENT: Negative for sore throat.   Respiratory: Positive for cough and shortness of breath. Negative for sputum production.   Cardiovascular: Negative for chest pain.  Gastrointestinal: Negative for abdominal pain.  All other systems reviewed and are negative.    Allergies  Aspirin and Ibuprofen  Home Medications  No current outpatient prescriptions on file.  BP 152/88  Pulse 139  Temp(Src) 98.6 F (37 C) (Oral)  Resp 25  SpO2 100%  Physical Exam  Constitutional: She is oriented to person, place, and time. She appears well-developed and  well-nourished. No distress.  HENT:  Head: Normocephalic and atraumatic.  Nose: Mucosal edema and rhinorrhea present.  Mouth/Throat: Oropharynx is clear and moist.  Eyes: Conjunctivae and EOM are normal. Pupils are equal, round, and reactive to light. Right eye exhibits no discharge.  Neck: Normal range of motion. Neck supple.  Cardiovascular: Regular rhythm, normal heart sounds and intact distal pulses.  Tachycardia present.   No murmur heard. Pulmonary/Chest: Tachypnea noted. No respiratory distress. She has no decreased breath sounds. She has wheezes. She has no rhonchi. She has no rales.  Abdominal: Soft. There is no tenderness.  Musculoskeletal: Normal range of motion. She exhibits no edema and no tenderness.  Neurological: She is alert and oriented to person, place, and time.  Skin: Skin is warm and dry. No rash noted.  Psychiatric: She has a normal mood and affect.    ED Course  Procedures (including critical care time)  Labs Reviewed  CBC WITH DIFFERENTIAL - Abnormal; Notable for the following:    Hemoglobin 8.2 (*)    HCT 29.8 (*)    MCV 61.1 (*)    MCH 16.8 (*)    MCHC 27.5 (*)    RDW 19.9 (*)    All other components within normal limits  POCT I-STAT, CHEM 8 - Abnormal; Notable for the following:    Potassium 3.0 (*)    BUN <3 (*)    Glucose, Bld 210 (*)    Calcium, Ion 1.06 (*)    Hemoglobin  9.9 (*)    HCT 29.0 (*)    All other components within normal limits   Dg Chest 2 View  03/15/2013  *RADIOLOGY REPORT*  Clinical Data: Shortness of breath.  History of asthma and hypertension.  CHEST - 2 VIEW  Comparison: 02/26/2013 and 02/24/2013.  Findings: The heart size and mediastinal contours are stable. There is chronic central airway thickening without hyperinflation or confluent airspace opacity.  There is no pleural effusion.  The osseous structures appear unchanged.  IMPRESSION: Chronic central airway thickening consistent with reactive airways disease or superimposed  viral infection.  No evidence of pneumonia.   Original Report Authenticated By: Carey Bullocks, M.D.      Date: 03/15/2013  Rate: 119  Rhythm: sinus tachycardia  QRS Axis: normal  Intervals: normal  ST/T Wave abnormalities: normal  Conduction Disutrbances:none  Narrative Interpretation:   Old EKG Reviewed: none available   No diagnosis found.    MDM    Pt with typical asthma exacerbation  symptoms. Recurrent episodes for the last 1 month due to high pollen and her ongoing sinus issues.  May2nd she has sinus surgery. She had No infectious sx, productive cough or other complaints.  Wheezing on exam.  will give steroids, albuterol/atrovent and recheck.  Patient states she had an asthma exacerbation approximately 3 weeks ago and had a five-day course of prednisone which helped initially and now symptoms have returned. She is feeling better after albuterol and Atrovent given by EMS however it made her tachycardic in the 130s. EKG shows a normal sinus tachycardia at 119. Patient given oral prednisone CBC and BMP as well chest x-ray pending.  At this time do not feel that patient needs BiPAP or magnesium  11:15 AM Pt is still SOB on re-eval but continues to be tachy so will give xopenex and atrovent.  11:48 AM Lab with stable Hb of 8 and CXR without acute changes.  Will ambulate pt and ensure stable O2 sats.   12:36 PM Pt with persistent wheezing and SOB.  Mag given and due to persistent wheezing, tachy and SOB will admit for obs.  Gwyneth Sprout, MD 03/15/13 [phone]

## 2013-03-15 NOTE — ED Notes (Addendum)
Iv team is unable to obtain iv access at this time. Md [place] notified and aware

## 2013-03-15 NOTE — ED Notes (Signed)
Iv team still at bedside.  

## 2013-03-15 NOTE — H&P (Signed)
[facility] History and Physical  [name] [name] GEX:[id number] DOB: 02/26/1976 DOA: 03/15/2013  Referring physician: ED physician PCP: No primary provider on file.   Chief Complaint: Shortness of breath  HPI:  Pt is 37 yo female with asthma who presents to [facility] ED with main concern of 2-3 days long progressively worsening shortness of breath, initially exertional only but now present at rest, associated with productive cough of yellow sputum, subjective fevers, chill,s weakness, poor oral intake. Pt reports similar events in the past that were due to her asthma flare. She denies chest pain, no abdominal or urinary concerns.   In ED, pt continuously wheezing even after 2 albuterol neb's treatments, oxygen in low 90's on 2 L [place]. [facility] asked to admit for observations of acute asthma.  Assessment and Plan:  Principal Problem:   Acute respiratory failure secondary to asthma exacerbation - likely secondary to asthma exacerbation with acute bronchitis - will provide supportive care with nebulizers as needed and scheduled, oxygen as needed - will also start Zithromax as pt has had fevers, chills at home  Active Problems:   Hyperglycemia - secondary to steroids likely  - will check A1C, pt is not diabetic as far as she knows    Anemia, microcytic - will order anemia panel, no need for transfusion at this point - CBC in AM   Hypokalemia - secondary to albuterol, will supplement - will check Mg level and BMP in AM  Code Status: Full Family Communication: Pt at bedside Disposition Plan: Admit to telemetry unit   Review of Systems:  Constitutional: Positive for fever, chills and malaise/fatigue. Negative for diaphoresis.  HENT: Negative for hearing loss, ear pain, nosebleeds, congestion, sore throat, neck pain, tinnitus and ear discharge.   Eyes: Negative for blurred vision, double vision, photophobia, pain, discharge and redness.  Respiratory: Positive for cough, sputum production,  shortness of breath, wheezing.   Cardiovascular: Negative for chest pain, palpitations, orthopnea, claudication and leg swelling.  Gastrointestinal: Negative for nausea, vomiting and abdominal pain. Negative for heartburn, constipation, blood in stool and melena.  Genitourinary: Negative for dysuria, urgency, frequency, hematuria and flank pain.  Musculoskeletal: Negative for myalgias, back pain, joint pain and falls.  Skin: Negative for itching and rash.  Neurological: Negative for dizziness and weakness. Negative for tingling, tremors, sensory change, speech change, focal weakness, loss of consciousness and headaches.  Endo/Heme/Allergies: Negative for environmental allergies and polydipsia. Does not bruise/bleed easily.  Psychiatric/Behavioral: Negative for suicidal ideas. The patient is not nervous/anxious.      Past Medical History  Diagnosis Date  . Asthma     History reviewed. No pertinent past surgical history.  Social History:  reports that she has never smoked. She does not have any smokeless tobacco history on file. She reports that she does not drink alcohol or use illicit drugs.  Allergies  Allergen Reactions  . Aspirin Anaphylaxis  . Ibuprofen Anaphylaxis    Family history of HTN  Prior to Admission medications   Not on File    Physical Exam: Filed Vitals:   03/15/13 0928  BP: 152/88  Pulse: 139  Temp: 98.6 F (37 C)  TempSrc: Oral  Resp: 25  SpO2: 100%    Physical Exam  Constitutional: Appears well-developed and well-nourished. No distress.  HENT: Normocephalic. External right and left ear normal. Oropharynx is clear and moist.  Eyes: Conjunctivae and EOM are normal. PERRLA, no scleral icterus.  Neck: Normal ROM. Neck supple. No JVD. No tracheal deviation. No thyromegaly.  CVS: Regular rhythm, tachycardic, S1/S2 +, no murmurs, no gallops, no carotid bruit.  Pulmonary: Effort and breath sounds normal, no stridor, rhonchi at bases with expiratory  wheezing  Abdominal: Soft. BS +,  no distension, tenderness, rebound or guarding.  Musculoskeletal: Normal range of motion. No edema and no tenderness.  Lymphadenopathy: No lymphadenopathy noted, cervical, inguinal. Neuro: Alert. Normal reflexes, muscle tone coordination. No cranial nerve deficit. Skin: Skin is warm and dry. No rash noted. Not diaphoretic. No erythema. No pallor.  Psychiatric: Normal mood and affect. Behavior, judgment, thought content normal.   Labs on Admission:  Basic Metabolic Panel:  Recent Labs Lab 03/15/13 1052  NA 139  K 3.0*  CL 105  GLUCOSE 210*  BUN <3*  CREATININE 0.60   CBC:  Recent Labs Lab 03/15/13 1019 03/15/13 1052  WBC 9.3  --   NEUTROABS 5.9  --   HGB 8.2* 9.9*  HCT 29.8* 29.0*  MCV 61.1*  --   PLT 193  --    Radiological Exams on Admission: Dg Chest 2 View  03/15/2013  *RADIOLOGY REPORT*  Clinical Data: Shortness of breath.  History of asthma and hypertension.  CHEST - 2 VIEW  Comparison: 02/26/2013 and 02/24/2013.  Findings: The heart size and mediastinal contours are stable. There is chronic central airway thickening without hyperinflation or confluent airspace opacity.  There is no pleural effusion.  The osseous structures appear unchanged.  IMPRESSION: Chronic central airway thickening consistent with reactive airways disease or superimposed viral infection.  No evidence of pneumonia.   Original Report Authenticated By: Carey Bullocks, M.D.     EKG: Normal sinus rhythm, no ST/T wave changes  Debbora Presto, MD  [facility] Pager [phone]  If 7PM-7AM, please contact night-coverage [email or web address] Password [email or web address] 03/15/2013, 1:01 PM

## 2013-03-16 DIAGNOSIS — J45909 Unspecified asthma, uncomplicated: Secondary | ICD-10-CM

## 2013-03-16 DIAGNOSIS — D638 Anemia in other chronic diseases classified elsewhere: Secondary | ICD-10-CM

## 2013-03-16 DIAGNOSIS — R7309 Other abnormal glucose: Secondary | ICD-10-CM

## 2013-03-16 LAB — BASIC METABOLIC PANEL
Chloride: 103 mEq/L (ref 96–112)
GFR calc Af Amer: >90 mL/min (ref >90)
Potassium: 4 mEq/L (ref 3.5–5.1)
Sodium: 135 mEq/L (ref 135–145)

## 2013-03-16 LAB — CBC
HCT: 26 % — ABNORMAL LOW (ref 36.0–46.0)
Hemoglobin: 7.3 g/dL — ABNORMAL LOW (ref 12.0–15.0)
WBC: 10.1 10*3/uL (ref 4.0–10.5)

## 2013-03-16 LAB — VITAMIN B12: Vitamin B-12: 481 pg/mL (ref 211–911)

## 2013-03-16 LAB — HEMOGLOBIN A1C
Hgb A1c MFr Bld: 7.6 % — ABNORMAL HIGH (ref <5.7)
Mean Plasma Glucose: 171 mg/dL — ABNORMAL HIGH (ref <117)

## 2013-03-16 LAB — FOLATE: Folate: 11.6 ng/mL

## 2013-03-16 MED ORDER — IPRATROPIUM-ALBUTEROL 0.5-2.5 (3) MG/3ML IN SOLN: 3.0000 mL | Freq: Four times a day (QID) | RESPIRATORY_TRACT | Status: DC | PRN

## 2013-03-16 MED ORDER — PREDNISONE 5 MG PO TABS: ORAL_TABLET | ORAL | Status: DC

## 2013-03-16 MED ORDER — CETIRIZINE HCL 10 MG PO CAPS: 10.0000 mg | ORAL_CAPSULE | Freq: Every day | ORAL | Status: DC

## 2013-03-16 MED ORDER — AZITHROMYCIN 250 MG PO TABS: 250.0000 mg | ORAL_TABLET | Freq: Every day | ORAL | Status: DC

## 2013-03-16 MED ORDER — ALBUTEROL SULFATE HFA 108 (90 BASE) MCG/ACT IN AERS: 2.0000 | INHALATION_SPRAY | Freq: Four times a day (QID) | RESPIRATORY_TRACT | Status: DC | PRN

## 2013-03-16 MED ORDER — FERROUS SULFATE 325 (65 FE) MG PO TABS: 325.0000 mg | ORAL_TABLET | Freq: Three times a day (TID) | ORAL | Status: DC

## 2013-03-16 NOTE — Progress Notes (Signed)
Patient discharged before I could get a chance to speak with her regarding her A1c of 7.6% and the likelihood that she has diabetes.  Ambrose Finland RN, MSN, CDE Diabetes Coordinator Inpatient Diabetes Program [phone]

## 2013-03-16 NOTE — Progress Notes (Signed)
Talked to patient about follow up medical care; patient is independent of ADL's, lives with her family and works from home; patient has medical insurance with [employer] Medicare Complete but does not have a PCP; CM talked to the patient about the importance of having a primary care physician instead of frequent visits to the ER; patient voiced understanding but talked about the co pay with the physician and the specialist- CM informed the patient that going to the ER is more expensive and that she need one point of contact for primary care; Patient is interested in [facility] [phone])- office is closed today for the holiday; patient stated that she would call them on Monday or Tuesday of next week when the office is open; B [employer]

## 2013-03-16 NOTE — Discharge Summary (Signed)
[facility]                                                                                   [name] [name], is a 37 y.o. female  DOB 1976-01-02  MRN [id number].  Admission date:  03/15/2013  Discharge Date:  03/16/2013  Primary MD  No primary provider on file.  Admitting Physician  Dorothea Ogle, MD  Admission Diagnosis  Asthma exacerbation [493.92]  Discharge Diagnosis     Principal Problem:   Asthma Active Problems:   Hyperglycemia   Anemia due to chronic illness   Hypokalemia    Past Medical History  Diagnosis Date  . Asthma     Past Surgical History  Procedure Laterality Date  . Cervical cerclage    . Tubal ligation       Recommendations for primary care physician for things to follow:    Follow glycemic control, he should says she takes metformin when necessary and at this time is not interested in diabetic medications.   Discharge Diagnoses:   Principal Problem:   Asthma Active Problems:   Hyperglycemia   Anemia due to chronic illness   Hypokalemia    Discharge Condition: stable   Diet recommendation: See Discharge Instructions below   Consults DM cordinator    History of present illness and  Hospital Course:     Kindly see H&P for history of present illness and admission details, please review complete Labs, Consult reports and Test reports for all details in brief [name] [name], is a 37 y.o. female, patient was admitted for shortness of breath and wheezing 2 to asthma exacerbation possibly mild URI, with IV steroids and nebulizer treatments all symptoms and wheezing have resolved. She now wants to be discharged home with outpatient followup with allergist, she will be sent home with nebulizer treatment, next inhaler and steroid taper along with azithromycin 5 more days.    Of note patient has been on steroids on and off for the last 3 months, her A1c was 7.6, patient says that when she would from [place] she had been prescribed  metformin which she takes on a when necessary basis, she has been requested to get diabetes education prior to leaving, at this time she's not interested in new medications. We'll request outpatient physician to closely monitor her A1c in glycemic control. Might be prudent to keep her on scheduled metformin as she might be diabetic. Patient says her A1c is elevated due to persistent steroid use.  Patient also has microcytic anemia, anemia panel consistent with iron deficiency. She will be placed on iron supplementation. Outpatient followup with CBC and iron levels recommended.   Today   Subjective:   [name] [name] today has no headache,no chest abdominal pain,no new weakness tingling or numbness, feels much better wants to go home today.   Objective:   Blood pressure 147/52, pulse 107, temperature 98.1 F (36.7 C), temperature source Oral, resp. rate 18, height 5\' 7"  (1.702 m), weight 136.533 kg (301 lb), last menstrual period 02/22/2013, SpO2 93.00%.   Intake/Output Summary (Last 24 hours) at 03/16/13 0848 Last data filed at 03/16/13 0847  Gross per 24 hour  Intake  600 ml  Output      0 ml  Net    600 ml    Exam Awake Alert, Oriented *3, No new F.N deficits, Normal affect [place].AT,PERRAL Supple Neck,No JVD, No cervical lymphadenopathy appriciated.  Symmetrical Chest wall movement, Good air movement bilaterally, CTAB RRR,No Gallops,Rubs or new Murmurs, No Parasternal Heave +ve B.Sounds, Abd Soft, Non tender, No organomegaly appriciated, No rebound -guarding or rigidity. No Cyanosis, Clubbing or edema, No new Rash or bruise  Data Review   Major procedures and Radiology Reports - PLEASE review detailed and final reports for all details in brief -      Dg Chest 2 View  03/15/2013  *RADIOLOGY REPORT*  Clinical Data: Shortness of breath.  History of asthma and hypertension.  CHEST - 2 VIEW  Comparison: 02/26/2013 and 02/24/2013.  Findings: The heart size and mediastinal  contours are stable. There is chronic central airway thickening without hyperinflation or confluent airspace opacity.  There is no pleural effusion.  The osseous structures appear unchanged.  IMPRESSION: Chronic central airway thickening consistent with reactive airways disease or superimposed viral infection.  No evidence of pneumonia.   Original Report Authenticated By: Carey Bullocks, M.D.     Micro Results      No results found for this or any previous visit (from the past 240 hour(s)).   CBC w Diff:  Lab Results  Component Value Date   WBC 10.1 03/16/2013   HGB 7.3* 03/16/2013   HCT 26.0* 03/16/2013   PLT 235 03/16/2013   LYMPHOPCT 24 03/15/2013   MONOPCT 7 03/15/2013   EOSPCT 5 03/15/2013   BASOPCT 0 03/15/2013    CMP:  Lab Results  Component Value Date   NA 135 03/16/2013   K 4.0 03/16/2013   CL 103 03/16/2013   CO2 22 03/16/2013   BUN 8 03/16/2013   CREATININE 0.58 03/16/2013  .   Discharge Instructions     Follow with Primary MD as suggested by the case manager in 7 days , follow on the results of anemia panel, A1c  Get CBC, CMP, checked 7 days by Primary MD and again as instructed by your Primary MD. Get a 2 view Chest X ray done next visit.  Get Medicines reviewed and adjusted.  Please request your Prim.MD to go over all Hospital Tests and Procedure/Radiological results at the follow up, please get all Hospital records sent to your Prim MD by signing hospital release before you go home.  Activity: As tolerated with Full fall precautions use walker/cane & assistance as needed   Diet: Heart Healthy -Low carb  For Heart failure patients - Check your Weight same time everyday, if you gain over 2 pounds, or you develop in leg swelling, experience more shortness of breath or chest pain, call your Primary MD immediately. Follow Cardiac Low Salt Diet and 1.8 lit/day fluid restriction.  Disposition Home   If you experience worsening of your admission symptoms, develop  shortness of breath, life threatening emergency, suicidal or homicidal thoughts you must seek medical attention immediately by calling 911 or calling your MD immediately  if symptoms less severe.  You Must read complete instructions/literature along with all the possible adverse reactions/side effects for all the Medicines you take and that have been prescribed to you. Take any new Medicines after you have completely understood and accpet all the possible adverse reactions/side effects.   Do not drive and provide baby sitting services if your were admitted for syncope or siezures until  you have seen by Primary MD or a Neurologist and advised to do so again.  Do not drive when taking Pain medications.    Do not take more than prescribed Pain, Sleep and Anxiety Medications  Special Instructions: If you have smoked or chewed Tobacco  in the last 2 yrs please stop smoking, stop any regular Alcohol  and or any Recreational drug use.  Wear Seat belts while driving.        Follow-up Information   Follow up with Primary care physician as suggested by the case manager. Schedule an appointment as soon as possible for a visit in 1 week.      Follow up with [facility], MD. Schedule an appointment as soon as possible for a visit in 1 week.   Contact information:   [address], [address] [place] [address] [phone]         Discharge Medications     Medication List    TAKE these medications       albuterol 108 (90 BASE) MCG/ACT inhaler  Commonly known as:  PROVENTIL HFA;VENTOLIN HFA  Inhale 2 puffs into the lungs every 6 (six) hours as needed for wheezing.     azithromycin 250 MG tablet  Commonly known as:  ZITHROMAX  Take 1 tablet (250 mg total) by mouth daily.     Cetirizine HCl 10 MG Caps  Commonly known as:  ZYRTEC ALLERGY  Take 1 capsule (10 mg total) by mouth daily.     ferrous sulfate 325 (65 FE) MG tablet  Take 1 tablet (325 mg total) by mouth 3 (three)  times daily with meals.     ipratropium-albuterol 0.5-2.5 (3) MG/3ML Soln  Commonly known as:  DUONEB  Take 3 mLs by nebulization every 6 (six) hours as needed (SOB,wheezing).     predniSONE 5 MG tablet  Commonly known as:  DELTASONE  Label  & dispense according to the schedule below. 10 Pills PO for 3 days then, 8 Pills PO for 3 days, 6 Pills PO for 3 days, 4 Pills PO for 3 days, 2 Pills PO for 3 days, 1 Pills PO for 3 days, 1/2 Pill  PO for 3 days then STOP. Total 95 pills.           Total Time in preparing paper work, data evaluation and todays exam - 35 minutes  Leroy Sea M.D on 03/16/2013 at 8:48 AM  [facility] Office  [phone]

## 2013-03-16 NOTE — Progress Notes (Signed)
CSW received referral to assist with transportation.   Patient provided with bus pass.   No further CSW needs identified.   Unice Bailey, LCSW [facility] Clinical Social Worker cell #: [phone]

## 2013-03-19 ENCOUNTER — Encounter (HOSPITAL_COMMUNITY): Payer: Self-pay | Admitting: Emergency Medicine

## 2013-04-16 ENCOUNTER — Emergency Department (HOSPITAL_BASED_OUTPATIENT_CLINIC_OR_DEPARTMENT_OTHER)
Admission: EM | Admit: 2013-04-16 | Discharge: 2013-04-17 | Disposition: A | Discharge status: Home / Self Care | Payer: Medicare Other | Attending: Emergency Medicine | Admitting: Emergency Medicine

## 2013-04-16 ENCOUNTER — Encounter (HOSPITAL_BASED_OUTPATIENT_CLINIC_OR_DEPARTMENT_OTHER): Payer: Self-pay | Admitting: Emergency Medicine

## 2013-04-16 ENCOUNTER — Emergency Department (HOSPITAL_BASED_OUTPATIENT_CLINIC_OR_DEPARTMENT_OTHER): Payer: Medicare Other

## 2013-04-16 DIAGNOSIS — J45901 Unspecified asthma with (acute) exacerbation: Secondary | ICD-10-CM | POA: Insufficient documentation

## 2013-04-16 DIAGNOSIS — Z8709 Personal history of other diseases of the respiratory system: Secondary | ICD-10-CM | POA: Insufficient documentation

## 2013-04-16 DIAGNOSIS — Z8719 Personal history of other diseases of the digestive system: Secondary | ICD-10-CM | POA: Insufficient documentation

## 2013-04-16 DIAGNOSIS — D649 Anemia, unspecified: Secondary | ICD-10-CM | POA: Insufficient documentation

## 2013-04-16 DIAGNOSIS — Z8701 Personal history of pneumonia (recurrent): Secondary | ICD-10-CM | POA: Insufficient documentation

## 2013-04-16 DIAGNOSIS — J45909 Unspecified asthma, uncomplicated: Secondary | ICD-10-CM

## 2013-04-16 DIAGNOSIS — Z79899 Other long term (current) drug therapy: Secondary | ICD-10-CM | POA: Insufficient documentation

## 2013-04-16 DIAGNOSIS — IMO0002 Reserved for concepts with insufficient information to code with codable children: Secondary | ICD-10-CM | POA: Insufficient documentation

## 2013-04-16 DIAGNOSIS — Z87448 Personal history of other diseases of urinary system: Secondary | ICD-10-CM | POA: Insufficient documentation

## 2013-04-16 DIAGNOSIS — I10 Essential (primary) hypertension: Secondary | ICD-10-CM | POA: Insufficient documentation

## 2013-04-16 DIAGNOSIS — Z8659 Personal history of other mental and behavioral disorders: Secondary | ICD-10-CM | POA: Insufficient documentation

## 2013-04-16 DIAGNOSIS — K219 Gastro-esophageal reflux disease without esophagitis: Secondary | ICD-10-CM | POA: Insufficient documentation

## 2013-04-16 DIAGNOSIS — E119 Type 2 diabetes mellitus without complications: Secondary | ICD-10-CM | POA: Insufficient documentation

## 2013-04-16 MED ORDER — METHYLPREDNISOLONE SODIUM SUCC 125 MG IJ SOLR: 125.0000 mg | Freq: Once | INTRAMUSCULAR | Status: DC

## 2013-04-16 MED ORDER — MAGNESIUM SULFATE 50 % IJ SOLN: 2.0000 g | Freq: Once | INTRAMUSCULAR | Status: DC

## 2013-04-16 MED ORDER — ALBUTEROL (5 MG/ML) CONTINUOUS INHALATION SOLN
15.0000 mg/h | INHALATION_SOLUTION | Freq: Once | RESPIRATORY_TRACT | Status: AC
  Administered 2013-04-16: 15 mg/h via RESPIRATORY_TRACT
  Filled 2013-04-16: qty 20
  Filled 2013-04-16: qty 0.5

## 2013-04-16 MED ORDER — METHYLPREDNISOLONE SODIUM SUCC 125 MG IJ SOLR
125.0000 mg | Freq: Once | INTRAMUSCULAR | Status: AC
  Administered 2013-04-16: 125 mg via INTRAMUSCULAR
  Filled 2013-04-16: qty 2

## 2013-04-16 NOTE — ED Notes (Signed)
Pt reports having nasal polyps and due to allergies recently she has had a significant amount of nasal drainage, with cough and congestion

## 2013-04-16 NOTE — ED Notes (Signed)
Transported via ems, pt complaining of sob, took 3 neb treatments at home with no relief, treatment given by ems

## 2013-04-16 NOTE — ED Provider Notes (Signed)
History  This chart was scribed for [name] Cooper III, MD by Ardeen Jourdain, ED Scribe. This patient was seen in room MH04/MH04 and the patient's care was started at 2148.  CSN: [id number]  Arrival date & time 04/16/13  2149   None     Chief Complaint  Patient presents with  . Shortness of Breath     The history is provided by the patient. No language interpreter was used.   HPI Comments: [name] [name] is a 37 y.o. female with a h/o nasal polyps and asthma brought in by ambulance, who presents to the Emergency Department complaining of gradual onset, gradually worsening, constant SOB that began 1 hour ago. Pt reports using 3 nebulizer treatments PTA with no relief. She states she was admitted within the past month for similar symptoms. She states she has a h/o nasal polyps which aggravate her asthma.  Pt was receiving an albuterol nebulizer treatment during exam.    Past Medical History  Diagnosis Date  . Asthma   . Hypertension   . Chronic anemia   . Abnormal TSH   . Morbid obesity   . GERD (gastroesophageal reflux disease)   . Seasonal allergies   . Nasal polyps   . Diabetes mellitus     type 2  . Chronic bronchitis   . Sleep apnea   . Shortness of breath 12/14/11    "related to how bad my asthma is; sometimes @ rest, lying down, w/exertion"  . Pneumonia   . Blood transfusion   . Inguinal hernia unilateral, non-recurrent     right  . Kidney infection 2008  . Headache   . Anxiety   . Depression   . PTSD (post-traumatic stress disorder)     "related to prior hospitalizations; things that happened when I was in hospital"  . Asthma     Past Surgical History  Procedure Laterality Date  . Cervical cerclage  1999; 2002  . Tubal ligation  2002  . Cervical cerclage    . Tubal ligation      Family History  Problem Relation Age of Onset  . Diabetes Mother   . Heart disease Father     History  Substance Use Topics  . Smoking status: Never Smoker   .  Smokeless tobacco: Never Used  . Alcohol Use: No     Comment: 12/14/11 "only on birthdays; parties, and such"   No OB history available.   Review of Systems  Respiratory: Positive for shortness of breath.   All other systems reviewed and are negative.    Allergies  Aspirin; Aspirin; Ibuprofen; Shellfish allergy; Menthol; and Other  Home Medications   Current Outpatient Rx  Name  Route  Sig  Dispense  Refill  . albuterol (PROVENTIL HFA;VENTOLIN HFA) 108 (90 BASE) MCG/ACT inhaler   Inhalation   Inhale 1-2 puffs into the lungs every 6 (six) hours as needed for wheezing.   1 Inhaler   0   . albuterol (PROVENTIL HFA;VENTOLIN HFA) 108 (90 BASE) MCG/ACT inhaler   Inhalation   Inhale 2 puffs into the lungs every 6 (six) hours as needed for wheezing or shortness of breath.         Marland Kitchen albuterol (PROVENTIL HFA;VENTOLIN HFA) 108 (90 BASE) MCG/ACT inhaler   Inhalation   Inhale 2 puffs into the lungs every 6 (six) hours as needed for wheezing.   1 Inhaler   2   . budesonide-formoterol (SYMBICORT) 160-4.5 MCG/ACT inhaler   Inhalation   Inhale 2  puffs into the lungs 2 (two) times daily.         . Cetirizine HCl (ZYRTEC ALLERGY) 10 MG CAPS   Oral   Take 1 capsule (10 mg total) by mouth daily.   30 capsule   1   . ferrous sulfate 325 (65 FE) MG tablet   Oral   Take 1 tablet (325 mg total) by mouth 3 (three) times daily with meals.   90 tablet   1   . fluticasone (FLONASE) 50 MCG/ACT nasal spray   Nasal   Place 2 sprays into the nose daily.          Marland Kitchen ipratropium-albuterol (DUONEB) 0.5-2.5 (3) MG/3ML SOLN   Nebulization   Take 3 mLs by nebulization every 4 (four) hours as needed (for wheezing or shortness of breath). For wheezing or shortness of breath         . ipratropium-albuterol (DUONEB) 0.5-2.5 (3) MG/3ML SOLN   Nebulization   Take 3 mLs by nebulization every 4 (four) hours as needed (shortness of breath).         Marland Kitchen ipratropium-albuterol (DUONEB) 0.5-2.5 (3)  MG/3ML SOLN   Nebulization   Take 3 mLs by nebulization every 6 (six) hours as needed (SOB,wheezing).   360 mL   2   . irbesartan-hydrochlorothiazide (AVALIDE) 300-25 MG per tablet   Oral   Take 1 tablet by mouth every morning.          . Potassium 99 MG TABS   Oral   Take 1 tablet by mouth 4 (four) times daily.          . ranitidine (ZANTAC) 150 MG capsule   Oral   Take 150 mg by mouth daily. For heartburn         . azithromycin (ZITHROMAX) 250 MG tablet   Oral   Take 1 tablet (250 mg total) by mouth daily.   5 each   0   . diphenhydrAMINE (BENADRYL) 25 mg capsule   Oral   Take 25 mg by mouth daily as needed for allergies.          . diphenhydrAMINE (BENADRYL) 25 mg capsule   Oral   Take 25 mg by mouth daily as needed for allergies.         . folic acid (FOLVITE) 400 MCG tablet   Oral   Take 400 mcg by mouth every morning.          . Potassium 99 MG TABS   Oral   Take 1 tablet by mouth 4 (four) times daily.         . predniSONE (DELTASONE) 5 MG tablet      Label  & dispense according to the schedule below. 10 Pills PO for 3 days then, 8 Pills PO for 3 days, 6 Pills PO for 3 days, 4 Pills PO for 3 days, 2 Pills PO for 3 days, 1 Pills PO for 3 days, 1/2 Pill  PO for 3 days then STOP. Total 95 pills.   95 tablet   0     Dispense as written.   . predniSONE (DELTASONE) 50 MG tablet   Oral   Take 1 tablet (50 mg total) by mouth daily.   5 tablet   0   . ranitidine (ZANTAC) 150 MG capsule   Oral   Take 150 mg by mouth daily.           Triage Vitals: BP 118/76  Pulse 130  Resp 44  SpO2 100%  Physical Exam  Nursing note and vitals reviewed. Constitutional: She is oriented to person, place, and time. She appears well-developed and well-nourished. No distress.  Morbidly obese  HENT:  Head: Normocephalic and atraumatic.  Eyes: EOM are normal. Pupils are equal, round, and reactive to light.  Neck: Normal range of motion. Neck supple. No  tracheal deviation present.  Cardiovascular: Normal rate, regular rhythm and normal heart sounds.  Exam reveals no gallop and no friction rub.   No murmur heard. Pulmonary/Chest: Effort normal. No respiratory distress. She has wheezes. She has no rales. She exhibits no tenderness.  Wheezes over both lung fields  Abdominal: Soft. Bowel sounds are normal. She exhibits no distension. There is no tenderness.  Musculoskeletal: Normal range of motion. She exhibits no edema and no tenderness.  Neurological: She is alert and oriented to person, place, and time.  Neurologically intact   Skin: Skin is warm and dry. She is not diaphoretic.  Psychiatric: She has a normal mood and affect. Her behavior is normal.    ED Course  Procedures (including critical care time)  DIAGNOSTIC STUDIES: Oxygen Saturation is 100% on room air, normal by my interpretation.    COORDINATION OF CARE:  9:57 PM-Discussed treatment plan which includes albuterol breathing treatments with pt at bedside and pt agreed to plan.   10:46 PM Pt has poor vascular access, no IV site found by RNs.  Will give Solumedrol IM.    12:13 AM Signed out to Dr. Rosalia Hammers at 12:13 A.M.      No diagnosis found.   I personally performed the services described in this documentation, which was scribed in my presence. The recorded information has been reviewed and is accurate.  Osvaldo Human, M.D.   Hilario Quarry, MD 04/17/13 [phone]

## 2013-04-17 LAB — BASIC METABOLIC PANEL
BUN: 10 mg/dL (ref 6–23)
Chloride: 103 mEq/L (ref 96–112)
GFR calc Af Amer: >90 mL/min (ref >90)
GFR calc non Af Amer: >90 mL/min (ref >90)
Potassium: 3.7 mEq/L (ref 3.5–5.1)
Sodium: 141 mEq/L (ref 135–145)

## 2013-04-17 LAB — CBC WITH DIFFERENTIAL/PLATELET
Basophils Absolute: 0 10*3/uL (ref 0.0–0.1)
Basophils Relative: 0 % (ref 0–1)
HCT: 33.7 % — ABNORMAL LOW (ref 36.0–46.0)
Lymphocytes Relative: 18 % (ref 12–46)
MCHC: 30.3 g/dL (ref 30.0–36.0)
Monocytes Absolute: 0.7 10*3/uL (ref 0.1–1.0)
Neutro Abs: 6.2 10*3/uL (ref 1.7–7.7)
Neutrophils Relative %: 70 % (ref 43–77)
Platelets: 381 10*3/uL (ref 150–400)
RDW: 31.1 % — ABNORMAL HIGH (ref 11.5–15.5)
WBC: 8.9 10*3/uL (ref 4.0–10.5)

## 2013-04-17 MED ORDER — PREDNISONE 10 MG PO TABS: 20.0000 mg | ORAL_TABLET | Freq: Every day | ORAL | Status: DC

## 2013-04-17 NOTE — ED Provider Notes (Signed)
Patient states at base line.  Continues tachypneic.  Wheezing resolved.  Patient ambulate and is dyspneic but does not drop sats.  She states this is baseline for her.  She is to continue home meds and will be given rx for prednisone.   Hilario Quarry, MD 04/17/13 [phone]

## 2013-04-17 NOTE — ED Notes (Signed)
MD at bedside. 

## 2013-04-17 NOTE — Progress Notes (Signed)
Patient albuteral CAT stopped and will observe patient in 30 minutes for rebound effect. The plan is to ambulate patient after 30 minutes and re-evaluate. Patient room air sats are 95%.

## 2013-04-17 NOTE — ED Notes (Signed)
Pt ambulated around dept on room air, O2 sats ranging from 95-97%.

## 2013-04-17 NOTE — Progress Notes (Signed)
Ambulated patient walking patient while maintaining a saturation monitor. Patient maintained oxygen saturations of 95-97%. Patient able to speak in complete sentences although winded in doing so. Patient tolerated well and patient returned to bed.

## 2013-04-17 NOTE — ED Notes (Signed)
RT at bs, neb tx continues, NAD noted.

## 2013-04-18 NOTE — ED Notes (Signed)
rec'd call from pt stating that [facility] would not fill her rx for prednisone because it was not signed by the EDP. Pt requesting that we call rx in to [facility] on [address]. Call placed to [facility] and message left on voicemail for pharmacist for Prednisone 10mg  tabs per rx in pt's chart. Pt aware and agrees with plan of care.

## 2013-04-19 ENCOUNTER — Emergency Department (HOSPITAL_BASED_OUTPATIENT_CLINIC_OR_DEPARTMENT_OTHER)
Admission: EM | Admit: 2013-04-19 | Discharge: 2013-04-19 | Disposition: A | Discharge status: Home / Self Care | Payer: Medicare Other | Attending: Emergency Medicine | Admitting: Emergency Medicine

## 2013-04-19 ENCOUNTER — Encounter (HOSPITAL_BASED_OUTPATIENT_CLINIC_OR_DEPARTMENT_OTHER): Payer: Self-pay | Admitting: *Deleted

## 2013-04-19 DIAGNOSIS — Z8709 Personal history of other diseases of the respiratory system: Secondary | ICD-10-CM | POA: Insufficient documentation

## 2013-04-19 DIAGNOSIS — I10 Essential (primary) hypertension: Secondary | ICD-10-CM | POA: Insufficient documentation

## 2013-04-19 DIAGNOSIS — K219 Gastro-esophageal reflux disease without esophagitis: Secondary | ICD-10-CM | POA: Insufficient documentation

## 2013-04-19 DIAGNOSIS — G4730 Sleep apnea, unspecified: Secondary | ICD-10-CM | POA: Insufficient documentation

## 2013-04-19 DIAGNOSIS — Z8701 Personal history of pneumonia (recurrent): Secondary | ICD-10-CM | POA: Insufficient documentation

## 2013-04-19 DIAGNOSIS — E119 Type 2 diabetes mellitus without complications: Secondary | ICD-10-CM | POA: Insufficient documentation

## 2013-04-19 DIAGNOSIS — Z8659 Personal history of other mental and behavioral disorders: Secondary | ICD-10-CM | POA: Insufficient documentation

## 2013-04-19 DIAGNOSIS — D649 Anemia, unspecified: Secondary | ICD-10-CM | POA: Insufficient documentation

## 2013-04-19 DIAGNOSIS — J45909 Unspecified asthma, uncomplicated: Secondary | ICD-10-CM

## 2013-04-19 DIAGNOSIS — Z87448 Personal history of other diseases of urinary system: Secondary | ICD-10-CM | POA: Insufficient documentation

## 2013-04-19 DIAGNOSIS — Z79899 Other long term (current) drug therapy: Secondary | ICD-10-CM | POA: Insufficient documentation

## 2013-04-19 DIAGNOSIS — Z8719 Personal history of other diseases of the digestive system: Secondary | ICD-10-CM | POA: Insufficient documentation

## 2013-04-19 DIAGNOSIS — J45901 Unspecified asthma with (acute) exacerbation: Secondary | ICD-10-CM | POA: Insufficient documentation

## 2013-04-19 MED ORDER — PREDNISONE 50 MG PO TABS
60.0000 mg | ORAL_TABLET | Freq: Once | ORAL | Status: AC
  Administered 2013-04-19: 60 mg via ORAL
  Filled 2013-04-19: qty 1

## 2013-04-19 MED ORDER — ALBUTEROL SULFATE (5 MG/ML) 0.5% IN NEBU
5.0000 mg | INHALATION_SOLUTION | Freq: Once | RESPIRATORY_TRACT | Status: AC
  Administered 2013-04-19: 5 mg via RESPIRATORY_TRACT
  Filled 2013-04-19: qty 1

## 2013-04-19 MED ORDER — IPRATROPIUM BROMIDE 0.02 % IN SOLN
0.5000 mg | Freq: Once | RESPIRATORY_TRACT | Status: AC
  Administered 2013-04-19: 0.5 mg via RESPIRATORY_TRACT
  Filled 2013-04-19: qty 2.5

## 2013-04-19 NOTE — Progress Notes (Signed)
Patients states she has been using her duo neb the past two days every four hours.  Patient states the she has been using her Symbicort Bid as prescribed.

## 2013-04-19 NOTE — ED Notes (Signed)
MD at bedside. 

## 2013-04-19 NOTE — ED Provider Notes (Signed)
History     CSN: [id number]  Arrival date & time 04/19/13  0054   First MD Initiated Contact with Patient 04/19/13 0325      Chief Complaint  Patient presents with  . Shortness of Breath    (Consider location/radiation/quality/duration/timing/severity/associated sxs/prior treatment) HPI This is a 37 year old female with a history of asthma. She was seen here 3 days ago and was discharged with a prescription for prednisone but has not been able to get the prednisone filled. She is here complaining of shortness of breath after eating her "third serving of dinner". She became more short of breath at her baseline and this scared her so she called EMS. EMS reports that she was able to ambulate and was speaking in complete sentences. At this time she states her breathing is improved. She denies chest pain or fever but has had a cough. Shortness of breath is exacerbated with ambulation. She has been using her albuterol with some relief.  Past Medical History  Diagnosis Date  . Asthma   . Hypertension   . Chronic anemia   . Abnormal TSH   . Morbid obesity   . GERD (gastroesophageal reflux disease)   . Seasonal allergies   . Nasal polyps   . Diabetes mellitus     type 2  . Chronic bronchitis   . Sleep apnea   . Shortness of breath 12/14/11    "related to how bad my asthma is; sometimes @ rest, lying down, w/exertion"  . Pneumonia   . Blood transfusion   . Inguinal hernia unilateral, non-recurrent     right  . Kidney infection 2008  . Headache   . Anxiety   . Depression   . PTSD (post-traumatic stress disorder)     "related to prior hospitalizations; things that happened when I was in hospital"  . Asthma     Past Surgical History  Procedure Laterality Date  . Cervical cerclage  1999; 2002  . Tubal ligation  2002  . Cervical cerclage    . Tubal ligation      Family History  Problem Relation Age of Onset  . Diabetes Mother   . Heart disease Father     History   Substance Use Topics  . Smoking status: Never Smoker   . Smokeless tobacco: Never Used  . Alcohol Use: No     Comment: 12/14/11 "only on birthdays; parties, and such"    OB History   Grav Para Term Preterm Abortions TAB SAB Ect Mult Living                  Review of Systems  All other systems reviewed and are negative.    Allergies  Aspirin; Aspirin; Ibuprofen; Shellfish allergy; Menthol; and Other  Home Medications   Current Outpatient Rx  Name  Route  Sig  Dispense  Refill  . albuterol (PROVENTIL HFA;VENTOLIN HFA) 108 (90 BASE) MCG/ACT inhaler   Inhalation   Inhale 1-2 puffs into the lungs every 6 (six) hours as needed for wheezing.   1 Inhaler   0   . albuterol (PROVENTIL HFA;VENTOLIN HFA) 108 (90 BASE) MCG/ACT inhaler   Inhalation   Inhale 2 puffs into the lungs every 6 (six) hours as needed for wheezing or shortness of breath.         Marland Kitchen albuterol (PROVENTIL HFA;VENTOLIN HFA) 108 (90 BASE) MCG/ACT inhaler   Inhalation   Inhale 2 puffs into the lungs every 6 (six) hours as needed for wheezing.  1 Inhaler   2   . azithromycin (ZITHROMAX) 250 MG tablet   Oral   Take 1 tablet (250 mg total) by mouth daily.   5 each   0   . budesonide-formoterol (SYMBICORT) 160-4.5 MCG/ACT inhaler   Inhalation   Inhale 2 puffs into the lungs 2 (two) times daily.         . Cetirizine HCl (ZYRTEC ALLERGY) 10 MG CAPS   Oral   Take 1 capsule (10 mg total) by mouth daily.   30 capsule   1   . diphenhydrAMINE (BENADRYL) 25 mg capsule   Oral   Take 25 mg by mouth daily as needed for allergies.          . diphenhydrAMINE (BENADRYL) 25 mg capsule   Oral   Take 25 mg by mouth daily as needed for allergies.         . ferrous sulfate 325 (65 FE) MG tablet   Oral   Take 1 tablet (325 mg total) by mouth 3 (three) times daily with meals.   90 tablet   1   . fluticasone (FLONASE) 50 MCG/ACT nasal spray   Nasal   Place 2 sprays into the nose daily.          . folic  acid (FOLVITE) 400 MCG tablet   Oral   Take 400 mcg by mouth every morning.          Marland Kitchen ipratropium-albuterol (DUONEB) 0.5-2.5 (3) MG/3ML SOLN   Nebulization   Take 3 mLs by nebulization every 4 (four) hours as needed (for wheezing or shortness of breath). For wheezing or shortness of breath         . ipratropium-albuterol (DUONEB) 0.5-2.5 (3) MG/3ML SOLN   Nebulization   Take 3 mLs by nebulization every 4 (four) hours as needed (shortness of breath).         Marland Kitchen ipratropium-albuterol (DUONEB) 0.5-2.5 (3) MG/3ML SOLN   Nebulization   Take 3 mLs by nebulization every 6 (six) hours as needed (SOB,wheezing).   360 mL   2   . irbesartan-hydrochlorothiazide (AVALIDE) 300-25 MG per tablet   Oral   Take 1 tablet by mouth every morning.          . Potassium 99 MG TABS   Oral   Take 1 tablet by mouth 4 (four) times daily.          . Potassium 99 MG TABS   Oral   Take 1 tablet by mouth 4 (four) times daily.         . predniSONE (DELTASONE) 10 MG tablet   Oral   Take 2 tablets (20 mg total) by mouth daily.   15 tablet   0   . predniSONE (DELTASONE) 5 MG tablet      Label  & dispense according to the schedule below. 10 Pills PO for 3 days then, 8 Pills PO for 3 days, 6 Pills PO for 3 days, 4 Pills PO for 3 days, 2 Pills PO for 3 days, 1 Pills PO for 3 days, 1/2 Pill  PO for 3 days then STOP. Total 95 pills.   95 tablet   0     Dispense as written.   . predniSONE (DELTASONE) 50 MG tablet   Oral   Take 1 tablet (50 mg total) by mouth daily.   5 tablet   0   . ranitidine (ZANTAC) 150 MG capsule   Oral   Take 150 mg by mouth  daily. For heartburn         . ranitidine (ZANTAC) 150 MG capsule   Oral   Take 150 mg by mouth daily.           BP 193/96  Pulse 124  Temp(Src) 98 F (36.7 C) (Axillary)  SpO2 96%  Physical Exam General: Well-developed, obese female in no acute distress; appearance consistent with age of record HENT: normocephalic,  atraumatic Eyes: pupils equal round and reactive to light; extraocular muscles intact Neck: supple Heart: regular rate and rhythm Lungs: Expiratory wheezing Abdomen: soft; obese Extremities: No deformity; full range of motion; pulses normal Neurologic: Awake, alert and oriented; motor function intact in all extremities and symmetric; no facial droop Skin: Warm and dry Psychiatric: Normal mood and affect    ED Course  Procedures (including critical care time)    MDM          Hanley Seamen, MD 04/19/13 0330

## 2013-04-19 NOTE — ED Notes (Signed)
Pt rec'd neb tx enroute by EMS and states she feels a little better at this time. Pt did not get her rx filled from ED visit on Monday night. Pt states she has been doing her duoneb every 4 hours and using symbicort.

## 2013-04-19 NOTE — ED Notes (Signed)
Pt c/o SOB after eating her 3rd serving of dinner, pt was able to ambulate with EMS and speak in complete sentences. Pt was here for hours Monday night with same complaint and given rx which pt did not fill.

## 2013-05-27 ENCOUNTER — Encounter (HOSPITAL_COMMUNITY): Payer: Self-pay

## 2013-05-27 ENCOUNTER — Emergency Department (HOSPITAL_COMMUNITY): Payer: Medicare Other

## 2013-05-27 ENCOUNTER — Emergency Department (HOSPITAL_COMMUNITY)
Admission: EM | Admit: 2013-05-27 | Discharge: 2013-05-27 | Disposition: A | Discharge status: Home / Self Care | Payer: Medicare Other | Attending: Emergency Medicine | Admitting: Emergency Medicine

## 2013-05-27 DIAGNOSIS — E119 Type 2 diabetes mellitus without complications: Secondary | ICD-10-CM | POA: Insufficient documentation

## 2013-05-27 DIAGNOSIS — Z862 Personal history of diseases of the blood and blood-forming organs and certain disorders involving the immune mechanism: Secondary | ICD-10-CM | POA: Insufficient documentation

## 2013-05-27 DIAGNOSIS — Z87448 Personal history of other diseases of urinary system: Secondary | ICD-10-CM | POA: Insufficient documentation

## 2013-05-27 DIAGNOSIS — I10 Essential (primary) hypertension: Secondary | ICD-10-CM | POA: Insufficient documentation

## 2013-05-27 DIAGNOSIS — Z8719 Personal history of other diseases of the digestive system: Secondary | ICD-10-CM | POA: Insufficient documentation

## 2013-05-27 DIAGNOSIS — Z8709 Personal history of other diseases of the respiratory system: Secondary | ICD-10-CM | POA: Insufficient documentation

## 2013-05-27 DIAGNOSIS — Z8701 Personal history of pneumonia (recurrent): Secondary | ICD-10-CM | POA: Insufficient documentation

## 2013-05-27 DIAGNOSIS — Z8639 Personal history of other endocrine, nutritional and metabolic disease: Secondary | ICD-10-CM | POA: Insufficient documentation

## 2013-05-27 DIAGNOSIS — J45901 Unspecified asthma with (acute) exacerbation: Secondary | ICD-10-CM | POA: Insufficient documentation

## 2013-05-27 DIAGNOSIS — G4730 Sleep apnea, unspecified: Secondary | ICD-10-CM | POA: Insufficient documentation

## 2013-05-27 DIAGNOSIS — D649 Anemia, unspecified: Secondary | ICD-10-CM | POA: Insufficient documentation

## 2013-05-27 DIAGNOSIS — Z79899 Other long term (current) drug therapy: Secondary | ICD-10-CM | POA: Insufficient documentation

## 2013-05-27 DIAGNOSIS — Z8659 Personal history of other mental and behavioral disorders: Secondary | ICD-10-CM | POA: Insufficient documentation

## 2013-05-27 DIAGNOSIS — J4531 Mild persistent asthma with (acute) exacerbation: Secondary | ICD-10-CM

## 2013-05-27 DIAGNOSIS — K219 Gastro-esophageal reflux disease without esophagitis: Secondary | ICD-10-CM | POA: Insufficient documentation

## 2013-05-27 LAB — CBC WITH DIFFERENTIAL/PLATELET
Eosinophils Absolute: 1.2 10*3/uL — ABNORMAL HIGH (ref 0.0–0.7)
Eosinophils Relative: 14 % — ABNORMAL HIGH (ref 0–5)
HCT: 32 % — ABNORMAL LOW (ref 36.0–46.0)
Hemoglobin: 9.4 g/dL — ABNORMAL LOW (ref 12.0–15.0)
Lymphocytes Relative: 35 % (ref 12–46)
MCHC: 29.4 g/dL — ABNORMAL LOW (ref 30.0–36.0)
Monocytes Absolute: 0.5 10*3/uL (ref 0.1–1.0)
Monocytes Relative: 6 % (ref 3–12)
Neutro Abs: 3.8 10*3/uL (ref 1.7–7.7)
Neutrophils Relative %: 44 % (ref 43–77)
WBC: 8.7 10*3/uL (ref 4.0–10.5)

## 2013-05-27 LAB — BASIC METABOLIC PANEL
BUN: 8 mg/dL (ref 6–23)
Calcium: 9.4 mg/dL (ref 8.4–10.5)
Creatinine, Ser: 0.81 mg/dL (ref 0.50–1.10)
GFR calc non Af Amer: >90 mL/min (ref >90)
Glucose, Bld: 131 mg/dL — ABNORMAL HIGH (ref 70–99)
Sodium: 138 mEq/L (ref 135–145)

## 2013-05-27 LAB — URINALYSIS, ROUTINE W REFLEX MICROSCOPIC
Glucose, UA: NEGATIVE mg/dL
Hgb urine dipstick: NEGATIVE
Specific Gravity, Urine: 1.027 (ref 1.005–1.030)
Urobilinogen, UA: 1 mg/dL (ref 0.0–1.0)
pH: 6 (ref 5.0–8.0)

## 2013-05-27 MED ORDER — POTASSIUM CHLORIDE CRYS ER 20 MEQ PO TBCR
40.0000 meq | EXTENDED_RELEASE_TABLET | Freq: Once | ORAL | Status: AC
  Administered 2013-05-27: 40 meq via ORAL
  Filled 2013-05-27: qty 2

## 2013-05-27 MED ORDER — PREDNISONE 20 MG PO TABS: 20.0000 mg | ORAL_TABLET | Freq: Two times a day (BID) | ORAL | Status: DC

## 2013-05-27 MED ORDER — ALBUTEROL (5 MG/ML) CONTINUOUS INHALATION SOLN: 10.0000 mg/h | INHALATION_SOLUTION | Freq: Once | RESPIRATORY_TRACT | Status: AC

## 2013-05-27 MED ORDER — PREDNISONE 20 MG PO TABS
60.0000 mg | ORAL_TABLET | Freq: Once | ORAL | Status: AC
  Administered 2013-05-27: 60 mg via ORAL
  Filled 2013-05-27: qty 1
  Filled 2013-05-27: qty 2

## 2013-05-27 MED ORDER — AZITHROMYCIN 250 MG PO TABS: ORAL_TABLET | ORAL | Status: DC

## 2013-05-27 MED ORDER — ALBUTEROL (5 MG/ML) CONTINUOUS INHALATION SOLN
INHALATION_SOLUTION | RESPIRATORY_TRACT | Status: AC
  Administered 2013-05-27: 10 mg/h via RESPIRATORY_TRACT
  Filled 2013-05-27: qty 20

## 2013-05-27 NOTE — ED Notes (Signed)
Notified MD.  Pt ambulated to bathroom with  No assist.  Sats 93% on arrival back to room.  HR elevated to 125 when returned.

## 2013-05-27 NOTE — ED Provider Notes (Addendum)
History    CSN: [id number] Arrival date & time 05/27/13  1719  First MD Initiated Contact with Patient 05/27/13 1723     Chief Complaint  Patient presents with  . Asthma   (Consider location/radiation/quality/duration/timing/severity/associated sxs/prior Treatment) HPI Comments: [name] [name] is a 37 y.o. female who presents for evaluation of shortness of breath. She last had an exacerbation of her asthma, 2 months ago. At that time. She received prednisone. Since that time. She has been using Symbicort inhaler, albuterol inhaler, albuterol nebulizers. Today, her medications were tried, but did not help her. She denies recent fever, chills, nausea, vomiting, weakness, or dizziness. She does not smoke cigarettes. She is not currently have a primary care provider in [place]. She plans on moving to [place] soon. She is more short of breath with movement and exertion. There are no alleviating factors.     Patient is a 37 y.o. female presenting with asthma. The history is provided by the patient.  Asthma   Past Medical History  Diagnosis Date  . Asthma   . Hypertension   . Chronic anemia   . Abnormal TSH   . Morbid obesity   . GERD (gastroesophageal reflux disease)   . Seasonal allergies   . Nasal polyps   . Diabetes mellitus     type 2  . Chronic bronchitis   . Sleep apnea   . Shortness of breath 12/14/11    "related to how bad my asthma is; sometimes @ rest, lying down, w/exertion"  . Pneumonia   . Blood transfusion   . Inguinal hernia unilateral, non-recurrent     right  . Kidney infection 2008  . Headache(784.0)   . Anxiety   . Depression   . PTSD (post-traumatic stress disorder)     "related to prior hospitalizations; things that happened when I was in hospital"  . Asthma    Past Surgical History  Procedure Laterality Date  . Cervical cerclage  1999; 2002  . Tubal ligation  2002  . Cervical cerclage    . Tubal ligation     Family History  Problem  Relation Age of Onset  . Diabetes Mother   . Heart disease Father    History  Substance Use Topics  . Smoking status: Never Smoker   . Smokeless tobacco: Never Used  . Alcohol Use: No     Comment: 12/14/11 "only on birthdays; parties, and such"   OB History   Grav Para Term Preterm Abortions TAB SAB Ect Mult Living                 Review of Systems  All other systems reviewed and are negative.    Allergies  Aspirin; Aspirin; Ibuprofen; Shellfish allergy; Menthol; and Other  Home Medications   Current Outpatient Rx  Name  Route  Sig  Dispense  Refill  . albuterol (PROVENTIL HFA;VENTOLIN HFA) 108 (90 BASE) MCG/ACT inhaler   Inhalation   Inhale 2 puffs into the lungs every 6 (six) hours as needed for wheezing or shortness of breath.         . budesonide-formoterol (SYMBICORT) 160-4.5 MCG/ACT inhaler   Inhalation   Inhale 2 puffs into the lungs 2 (two) times daily.         . diphenhydrAMINE (BENADRYL) 25 mg capsule   Oral   Take 25 mg by mouth daily as needed for allergies.          . ferrous sulfate 325 (65 FE) MG tablet  Oral   Take 1 tablet (325 mg total) by mouth 3 (three) times daily with meals.   90 tablet   1   . folic acid (FOLVITE) 400 MCG tablet   Oral   Take 400 mcg by mouth every morning.          Marland Kitchen ipratropium-albuterol (DUONEB) 0.5-2.5 (3) MG/3ML SOLN   Nebulization   Take 3 mLs by nebulization every 4 (four) hours as needed (for wheezing or shortness of breath). For wheezing or shortness of breath         . irbesartan-hydrochlorothiazide (AVALIDE) 300-25 MG per tablet   Oral   Take 1 tablet by mouth every morning.          . Potassium 99 MG TABS   Oral   Take 1 tablet by mouth 4 (four) times daily.          . ranitidine (ZANTAC) 150 MG capsule   Oral   Take 150 mg by mouth daily. For heartburn         . azithromycin (ZITHROMAX Z-PAK) 250 MG tablet      2 po day one, then 1 daily x 4 days   5 tablet   0   . predniSONE  (DELTASONE) 20 MG tablet   Oral   Take 1 tablet (20 mg total) by mouth 2 (two) times daily.   10 tablet   0    BP 155/75  Pulse 117  Temp(Src) 98.6 F (37 C) (Axillary)  Resp 20  SpO2 97% Physical Exam  Nursing note and vitals reviewed. Constitutional: She is oriented to person, place, and time. She appears well-developed and well-nourished.  HENT:  Head: Normocephalic and atraumatic.  Eyes: Conjunctivae and EOM are normal. Pupils are equal, round, and reactive to light.  Neck: Normal range of motion and phonation normal. Neck supple.  Cardiovascular: Normal rate, regular rhythm and intact distal pulses.   Pulmonary/Chest: Effort normal. No respiratory distress. She exhibits no tenderness.  Decreased breath sounds bilaterally with scattered wheezes and rhonchi. She is resting comfortably in the semi-fowler position.  Abdominal: Soft. She exhibits no distension. There is no tenderness. There is no guarding.  Musculoskeletal: Normal range of motion.  Neurological: She is alert and oriented to person, place, and time. She has normal strength. She exhibits normal muscle tone.  Skin: Skin is warm and dry.  Psychiatric: She has a normal mood and affect. Her behavior is normal. Judgment and thought content normal.    ED Course  Procedures (including critical care time)  Medications  predniSONE (DELTASONE) tablet 60 mg (60 mg Oral Given 05/27/13 1805)  albuterol (PROVENTIL,VENTOLIN) solution continuous neb (10 mg/hr Nebulization Given 05/27/13 1744)  potassium chloride SA (K-DUR,KLOR-CON) CR tablet 40 mEq (40 mEq Oral Given 05/27/13 1931)    Patient Vitals for the past 24 hrs:  BP Temp Temp src Pulse Resp SpO2  05/27/13 2114 155/75 mmHg 98.6 F (37 C) Axillary 117 20 97 %  05/27/13 1744 - - - - - 96 %  05/27/13 1732 167/93 mmHg - - - 18 -  05/27/13 1723 163/114 mmHg 98.8 F (37.1 C) Axillary 130 22 100 %  05/27/13 1722 163/114 mmHg - - 130 30 100 %  05/27/13 1720 - - - - - 100 %      11:02 PM Reevaluation with update and discussion. After initial assessment and treatment, an updated evaluation reveals she feels better, she feels like she is at her baseline and is comfortable going  home. She has both albuterol inhaler, and albuterol nebulizer at home. [name] [name] L      Date: 05/27/13  Rate: 143  Rhythm: sinus tachycardia  QRS Axis: normal  PR and QT Intervals: normal  ST/T Wave abnormalities: nonspecific ST changes  PR and QRS Conduction Disutrbances:none  Narrative Interpretation:   Old EKG Reviewed: changes noted- rate faster since 02/24/13   Labs Reviewed  CBC WITH DIFFERENTIAL - Abnormal; Notable for the following:    Hemoglobin 9.4 (*)    HCT 32.0 (*)    MCV 66.8 (*)    MCH 19.6 (*)    MCHC 29.4 (*)    RDW 20.9 (*)    Eosinophils Relative 14 (*)    Eosinophils Absolute 1.2 (*)    All other components within normal limits  BASIC METABOLIC PANEL - Abnormal; Notable for the following:    Potassium 2.3 (*)    Glucose, Bld 131 (*)    All other components within normal limits  URINALYSIS, ROUTINE W REFLEX MICROSCOPIC - Abnormal; Notable for the following:    Ketones, ur 15 (*)    All other components within normal limits  URINALYSIS, ROUTINE W REFLEX MICROSCOPIC   Dg Chest 2 View  05/27/2013   *RADIOLOGY REPORT*  Clinical Data: Shortness of breath, asthma  CHEST - 2 VIEW  Comparison: Prior radiograph from 04/16/2013  Findings: Cardiac and mediastinal silhouettes are able in size and contour, and remain within normal limits.  Mild central peribronchial thickening, unchanged. No new pulmonary parenchymal abnormalities. There is no pneumothorax.  IMPRESSION: Stable mild changes of chronic bronchitis and/or asthma. No acute cardiopulmonary disease.   Original Report Authenticated By: Rise Mu, M.D.   1. Asthma exacerbation, mild persistent     MDM  Evaluation consistent with exacerbation of asthma. Doubt pneumonia, ACS, or PE. Doubt  metabolic instability, serious bacterial infection or impending vascular collapse; the patient is stable for discharge.    Nursing Notes Reviewed/ Care Coordinated, and agree without changes. Applicable Imaging Reviewed.  Interpretation of Laboratory Data incorporated into ED treatment    Plan: Home Medications-  Zithromax, Prednisone, ; Home Treatments and Observation- Rest, watch for progressive sx; return here if the recommended treatment, does not improve the symptoms; Recommended follow up- PCP of choice or return here for problems.    Flint Melter, MD 05/27/13 2320  Flint Melter, MD 05/28/13 [phone]

## 2013-05-27 NOTE — ED Notes (Addendum)
Per EMS: Pt began having asthma attack since this morning.  Was hospitalized recently for this.  EMS gave 10 mg albuterol 1 mg atrovent en route.

## 2013-05-27 NOTE — ED Notes (Signed)
[id number]<AV> Expected date:<BR> Expected time:<BR> Means of arrival:<BR> Comments:<BR> 37 y/o asthma

## 2013-05-27 NOTE — ED Notes (Signed)
Pt aware of the need for a urine sample. 

## 2013-05-28 ENCOUNTER — Emergency Department (HOSPITAL_COMMUNITY): Payer: Medicare Other

## 2013-05-28 ENCOUNTER — Inpatient Hospital Stay (HOSPITAL_COMMUNITY)
Admission: EM | Admit: 2013-05-28 | Discharge: 2013-05-30 | DRG: 189 | Disposition: A | Discharge status: Home / Self Care | Payer: Medicare Other | Attending: Internal Medicine | Admitting: Internal Medicine

## 2013-05-28 ENCOUNTER — Encounter (HOSPITAL_COMMUNITY): Payer: Self-pay | Admitting: Emergency Medicine

## 2013-05-28 DIAGNOSIS — E876 Hypokalemia: Secondary | ICD-10-CM

## 2013-05-28 DIAGNOSIS — D509 Iron deficiency anemia, unspecified: Secondary | ICD-10-CM

## 2013-05-28 DIAGNOSIS — F329 Major depressive disorder, single episode, unspecified: Secondary | ICD-10-CM | POA: Diagnosis present

## 2013-05-28 DIAGNOSIS — Z6841 Body Mass Index (BMI) 40.0 and over, adult: Secondary | ICD-10-CM

## 2013-05-28 DIAGNOSIS — D638 Anemia in other chronic diseases classified elsewhere: Secondary | ICD-10-CM

## 2013-05-28 DIAGNOSIS — G4733 Obstructive sleep apnea (adult) (pediatric): Secondary | ICD-10-CM

## 2013-05-28 DIAGNOSIS — J40 Bronchitis, not specified as acute or chronic: Secondary | ICD-10-CM

## 2013-05-28 DIAGNOSIS — J9601 Acute respiratory failure with hypoxia: Secondary | ICD-10-CM

## 2013-05-28 DIAGNOSIS — K219 Gastro-esophageal reflux disease without esophagitis: Secondary | ICD-10-CM

## 2013-05-28 DIAGNOSIS — D649 Anemia, unspecified: Secondary | ICD-10-CM

## 2013-05-28 DIAGNOSIS — J4541 Moderate persistent asthma with (acute) exacerbation: Secondary | ICD-10-CM

## 2013-05-28 DIAGNOSIS — R000 Tachycardia, unspecified: Secondary | ICD-10-CM

## 2013-05-28 DIAGNOSIS — R739 Hyperglycemia, unspecified: Secondary | ICD-10-CM

## 2013-05-28 DIAGNOSIS — I10 Essential (primary) hypertension: Secondary | ICD-10-CM

## 2013-05-28 DIAGNOSIS — J4521 Mild intermittent asthma with (acute) exacerbation: Secondary | ICD-10-CM

## 2013-05-28 DIAGNOSIS — J9600 Acute respiratory failure, unspecified whether with hypoxia or hypercapnia: Principal | ICD-10-CM | POA: Diagnosis present

## 2013-05-28 DIAGNOSIS — J329 Chronic sinusitis, unspecified: Secondary | ICD-10-CM

## 2013-05-28 DIAGNOSIS — F3289 Other specified depressive episodes: Secondary | ICD-10-CM | POA: Diagnosis present

## 2013-05-28 DIAGNOSIS — E119 Type 2 diabetes mellitus without complications: Secondary | ICD-10-CM

## 2013-05-28 DIAGNOSIS — F411 Generalized anxiety disorder: Secondary | ICD-10-CM | POA: Diagnosis present

## 2013-05-28 DIAGNOSIS — J45901 Unspecified asthma with (acute) exacerbation: Secondary | ICD-10-CM | POA: Diagnosis present

## 2013-05-28 LAB — BASIC METABOLIC PANEL
CO2: 20 mEq/L (ref 19–32)
Calcium: 9.6 mg/dL (ref 8.4–10.5)
Glucose, Bld: 142 mg/dL — ABNORMAL HIGH (ref 70–99)
Potassium: 2.7 mEq/L — CL (ref 3.5–5.1)
Sodium: 140 mEq/L (ref 135–145)

## 2013-05-28 LAB — TROPONIN I: Troponin I: <0.3 ng/mL (ref <0.30)

## 2013-05-28 LAB — CBC WITH DIFFERENTIAL/PLATELET
Eosinophils Relative: 1 % (ref 0–5)
HCT: 32.7 % — ABNORMAL LOW (ref 36.0–46.0)
Hemoglobin: 9.9 g/dL — ABNORMAL LOW (ref 12.0–15.0)
Lymphocytes Relative: 16 % (ref 12–46)
Lymphs Abs: 1.8 10*3/uL (ref 0.7–4.0)
MCV: 66.5 fL — ABNORMAL LOW (ref 78.0–100.0)
Monocytes Relative: 5 % (ref 3–12)
Platelets: 325 10*3/uL (ref 150–400)
RBC: 4.92 MIL/uL (ref 3.87–5.11)
WBC: 11.2 10*3/uL — ABNORMAL HIGH (ref 4.0–10.5)

## 2013-05-28 MED ORDER — DEXTROSE 5 % IV SOLN
250.0000 mg | Freq: Every day | INTRAVENOUS | Status: DC
  Administered 2013-05-29: 250 mg via INTRAVENOUS
  Filled 2013-05-28 (×2): qty 250

## 2013-05-28 MED ORDER — SODIUM CHLORIDE 0.9 % IV SOLN: 250.0000 mL | INTRAVENOUS | Status: DC | PRN

## 2013-05-28 MED ORDER — FERROUS SULFATE 325 (65 FE) MG PO TABS
325.0000 mg | ORAL_TABLET | Freq: Three times a day (TID) | ORAL | Status: DC
  Administered 2013-05-29 – 2013-05-30 (×4): 325 mg via ORAL
  Filled 2013-05-28 (×7): qty 1

## 2013-05-28 MED ORDER — ALBUTEROL SULFATE (5 MG/ML) 0.5% IN NEBU
2.5000 mg | INHALATION_SOLUTION | Freq: Four times a day (QID) | RESPIRATORY_TRACT | Status: DC
  Administered 2013-05-29 (×4): 2.5 mg via RESPIRATORY_TRACT
  Filled 2013-05-28 (×4): qty 0.5

## 2013-05-28 MED ORDER — ALBUTEROL SULFATE (5 MG/ML) 0.5% IN NEBU
2.5000 mg | INHALATION_SOLUTION | RESPIRATORY_TRACT | Status: DC | PRN
  Administered 2013-05-29: 2.5 mg via RESPIRATORY_TRACT
  Filled 2013-05-28 (×2): qty 0.5

## 2013-05-28 MED ORDER — POTASSIUM CHLORIDE 20 MEQ/15ML (10%) PO LIQD
20.0000 meq | Freq: Two times a day (BID) | ORAL | Status: DC
  Administered 2013-05-29 – 2013-05-30 (×4): 20 meq via ORAL
  Filled 2013-05-28 (×5): qty 15

## 2013-05-28 MED ORDER — ENOXAPARIN SODIUM 40 MG/0.4ML ~~LOC~~ SOLN
40.0000 mg | Freq: Every day | SUBCUTANEOUS | Status: DC
  Administered 2013-05-29 (×2): 40 mg via SUBCUTANEOUS
  Filled 2013-05-28 (×3): qty 0.4

## 2013-05-28 MED ORDER — GUAIFENESIN ER 600 MG PO TB12
600.0000 mg | ORAL_TABLET | Freq: Two times a day (BID) | ORAL | Status: DC
  Administered 2013-05-28 – 2013-05-30 (×4): 600 mg via ORAL
  Filled 2013-05-28 (×5): qty 1

## 2013-05-28 MED ORDER — ALBUTEROL SULFATE (5 MG/ML) 0.5% IN NEBU: 5.0000 mg | INHALATION_SOLUTION | Freq: Once | RESPIRATORY_TRACT | Status: DC

## 2013-05-28 MED ORDER — SODIUM CHLORIDE 0.9 % IJ SOLN
3.0000 mL | Freq: Two times a day (BID) | INTRAMUSCULAR | Status: DC
  Administered 2013-05-29: 3 mL via INTRAVENOUS

## 2013-05-28 MED ORDER — BUDESONIDE-FORMOTEROL FUMARATE 160-4.5 MCG/ACT IN AERO
2.0000 | INHALATION_SPRAY | Freq: Two times a day (BID) | RESPIRATORY_TRACT | Status: DC
  Administered 2013-05-29 – 2013-05-30 (×3): 2 via RESPIRATORY_TRACT
  Filled 2013-05-28: qty 6

## 2013-05-28 MED ORDER — METHYLPREDNISOLONE SODIUM SUCC 125 MG IJ SOLR
80.0000 mg | Freq: Two times a day (BID) | INTRAMUSCULAR | Status: DC
  Administered 2013-05-29: 80 mg via INTRAVENOUS
  Filled 2013-05-28 (×3): qty 1.28

## 2013-05-28 MED ORDER — IPRATROPIUM BROMIDE 0.02 % IN SOLN
0.5000 mg | Freq: Four times a day (QID) | RESPIRATORY_TRACT | Status: DC
  Administered 2013-05-29 (×5): 0.5 mg via RESPIRATORY_TRACT
  Filled 2013-05-28 (×5): qty 2.5

## 2013-05-28 MED ORDER — ALBUTEROL SULFATE (5 MG/ML) 0.5% IN NEBU
5.0000 mg | INHALATION_SOLUTION | Freq: Once | RESPIRATORY_TRACT | Status: AC
  Administered 2013-05-28: 5 mg via RESPIRATORY_TRACT
  Filled 2013-05-28: qty 1

## 2013-05-28 MED ORDER — SODIUM CHLORIDE 0.9 % IV SOLN
INTRAVENOUS | Status: AC
  Administered 2013-05-28: 21:00:00 via INTRAVENOUS

## 2013-05-28 MED ORDER — SODIUM CHLORIDE 0.9 % IJ SOLN: 3.0000 mL | Freq: Two times a day (BID) | INTRAMUSCULAR | Status: DC

## 2013-05-28 MED ORDER — SODIUM CHLORIDE 0.9 % IJ SOLN: 3.0000 mL | INTRAMUSCULAR | Status: DC | PRN

## 2013-05-28 MED ORDER — DIPHENHYDRAMINE HCL 25 MG PO CAPS
25.0000 mg | ORAL_CAPSULE | Freq: Every day | ORAL | Status: DC | PRN
  Administered 2013-05-29: 25 mg via ORAL
  Filled 2013-05-28: qty 1

## 2013-05-28 NOTE — ED Notes (Signed)
Per EMS-Pt c/o of SOB, Hx of asthma. Here last night for same. Asthma attack earlier today 1200 treated by EMS, refused transport. Exacerbation again around 5pm. 2 of albuterol 2 Atrovent given during route. 125 Sol Medrol. Sinus Tach

## 2013-05-28 NOTE — Progress Notes (Signed)
   CARE MANAGEMENT ED NOTE 05/28/2013  Patient:  Krinsky,Destony   Account Number:  [id number]  Date Initiated:  05/28/2013  Documentation initiated by:  Radford Pax  Subjective/Objective Assessment:   Patient presented with exacerbation of asthma.     Subjective/Objective Assessment Detail:     Action/Plan:   Action/Plan Detail:   Anticipated DC Date:       Status Recommendation to Physician:   Result of Recommendation:    Other ED Services  Consult Working Plan    DC Planning Services  Other  PCP issues    Choice offered to / List presented to:            Status of service:  Completed, signed off  ED Comments:   ED Comments Detail:  Patient listed as not having a pcp.  EDCM provided patient a list of medicare accepting pcp's within ten miles of patient's zip code.  Patient thankful for information.

## 2013-05-28 NOTE — ED Notes (Signed)
[id number]<JY> Expected date:05/28/13<BR> Expected time: 5:17 PM<BR> Means of arrival:Ambulance<BR> Comments:<BR> EMS SOB

## 2013-05-28 NOTE — ED Provider Notes (Signed)
History    CSN: [id number] Arrival date & time 05/28/13  1721  First MD Initiated Contact with Patient 05/28/13 1729     Chief Complaint  Patient presents with  . Shortness of Breath   (Consider location/radiation/quality/duration/timing/severity/associated sxs/prior Treatment) HPI Comments: Patient returns to the emergency department for evaluation of difficulty breathing. Patient has a history of asthma, was seen last night in the emergency department for an exacerbation. She was treated and discharged. Patient was prescribed Zithromax and prednisone, continue bronchodilators. Through the course of the day her breathing has worsened. EMS responded to her home at noon today, but she declined transport at that time. She progressively worsened and then called EMS again, brought to the ER. She was given 125 of Solu-Medrol as well as albuterol and Atrovent during transport. She has not had much improvement.  Patient is a 37 y.o. female presenting with shortness of breath.  Shortness of Breath Associated symptoms: cough and fever    Past Medical History  Diagnosis Date  . Asthma   . Hypertension   . Chronic anemia   . Abnormal TSH   . Morbid obesity   . GERD (gastroesophageal reflux disease)   . Seasonal allergies   . Nasal polyps   . Diabetes mellitus     type 2  . Chronic bronchitis   . Sleep apnea   . Shortness of breath 12/14/11    "related to how bad my asthma is; sometimes @ rest, lying down, w/exertion"  . Pneumonia   . Blood transfusion   . Inguinal hernia unilateral, non-recurrent     right  . Kidney infection 2008  . Headache(784.0)   . Anxiety   . Depression   . PTSD (post-traumatic stress disorder)     "related to prior hospitalizations; things that happened when I was in hospital"  . Asthma    Past Surgical History  Procedure Laterality Date  . Cervical cerclage  1999; 2002  . Tubal ligation  2002  . Cervical cerclage    . Tubal ligation     Family  History  Problem Relation Age of Onset  . Diabetes Mother   . Heart disease Father    History  Substance Use Topics  . Smoking status: Never Smoker   . Smokeless tobacco: Never Used  . Alcohol Use: No     Comment: 12/14/11 "only on birthdays; parties, and such"   OB History   Grav Para Term Preterm Abortions TAB SAB Ect Mult Living                 Review of Systems  Constitutional: Positive for fever.  Respiratory: Positive for cough and shortness of breath.   All other systems reviewed and are negative.    Allergies  Aspirin; Aspirin; Ibuprofen; Shellfish allergy; Menthol; and Other  Home Medications   Current Outpatient Rx  Name  Route  Sig  Dispense  Refill  . albuterol (PROVENTIL HFA;VENTOLIN HFA) 108 (90 BASE) MCG/ACT inhaler   Inhalation   Inhale 2 puffs into the lungs every 6 (six) hours as needed for wheezing or shortness of breath.         Marland Kitchen azithromycin (ZITHROMAX Z-PAK) 250 MG tablet      2 po day one, then 1 daily x 4 days   5 tablet   0   . budesonide-formoterol (SYMBICORT) 160-4.5 MCG/ACT inhaler   Inhalation   Inhale 2 puffs into the lungs 2 (two) times daily.         Marland Kitchen  diphenhydrAMINE (BENADRYL) 25 mg capsule   Oral   Take 25 mg by mouth daily as needed for allergies.          . ferrous sulfate 325 (65 FE) MG tablet   Oral   Take 1 tablet (325 mg total) by mouth 3 (three) times daily with meals.   90 tablet   1   . folic acid (FOLVITE) 400 MCG tablet   Oral   Take 400 mcg by mouth every morning.          Marland Kitchen ipratropium-albuterol (DUONEB) 0.5-2.5 (3) MG/3ML SOLN   Nebulization   Take 3 mLs by nebulization every 4 (four) hours as needed (for wheezing or shortness of breath). For wheezing or shortness of breath         . irbesartan-hydrochlorothiazide (AVALIDE) 300-25 MG per tablet   Oral   Take 1 tablet by mouth every morning.          . Potassium 99 MG TABS   Oral   Take 1 tablet by mouth 4 (four) times daily.           . predniSONE (DELTASONE) 20 MG tablet   Oral   Take 1 tablet (20 mg total) by mouth 2 (two) times daily.   10 tablet   0   . ranitidine (ZANTAC) 150 MG capsule   Oral   Take 150 mg by mouth daily. For heartburn          There were no vitals taken for this visit. Physical Exam  Constitutional: She is oriented to person, place, and time. She appears well-developed and well-nourished. No distress.  HENT:  Head: Normocephalic and atraumatic.  Right Ear: Hearing normal.  Left Ear: Hearing normal.  Nose: Nose normal.  Mouth/Throat: Oropharynx is clear and moist and mucous membranes are normal.  Eyes: Conjunctivae and EOM are normal. Pupils are equal, round, and reactive to light.  Neck: Normal range of motion. Neck supple.  Cardiovascular: Regular rhythm, S1 normal and S2 normal.  Tachycardia present.  Exam reveals no gallop and no friction rub.   No murmur heard. Pulmonary/Chest: Effort normal. No respiratory distress. She has decreased breath sounds. She has wheezes. She exhibits no tenderness.  Abdominal: Soft. Normal appearance and bowel sounds are normal. There is no hepatosplenomegaly. There is no tenderness. There is no rebound, no guarding, no tenderness at McBurney's point and negative Murphy's sign. No hernia.  Musculoskeletal: Normal range of motion.  Neurological: She is alert and oriented to person, place, and time. She has normal strength. No cranial nerve deficit or sensory deficit. Coordination normal. GCS eye subscore is 4. GCS verbal subscore is 5. GCS motor subscore is 6.  Skin: Skin is warm, dry and intact. No rash noted. No cyanosis.  Psychiatric: She has a normal mood and affect. Her speech is normal and behavior is normal. Thought content normal.    ED Course  Procedures (including critical care time)  EKG:  Date: 05/28/2013  Rate: 143  Rhythm: sinus tachycardia  QRS Axis: normal  Intervals: normal  ST/T Wave abnormalities: nonspecific ST/T changes   Conduction Disutrbances:none  Narrative Interpretation:   Old EKG Reviewed: none available    Labs Reviewed - No data to display Dg Chest 2 View  05/27/2013   *RADIOLOGY REPORT*  Clinical Data: Shortness of breath, asthma  CHEST - 2 VIEW  Comparison: Prior radiograph from 04/16/2013  Findings: Cardiac and mediastinal silhouettes are able in size and contour, and remain within normal limits.  Mild central peribronchial thickening, unchanged. No new pulmonary parenchymal abnormalities. There is no pneumothorax.  IMPRESSION: Stable mild changes of chronic bronchitis and/or asthma. No acute cardiopulmonary disease.   Original Report Authenticated By: Rise Mu, M.D.   Diagnosis: Acute asthma exacerbation  MDM  Presents to the ER for evaluation of asthma. Patient was seen yesterday for the same. She improved with treatment here in the ER and was discharged on Zithromax and prednisone. She was to continue bronchodilators at home as well. Patient has had progressive worsening through the course of today. Patient arrived tachycardic, tachypneic. She is only somewhat improved with bronchodilators and Solu-Medrol. Because she is on maximal treatment at home and has failed this therapy, patient will require hospitalization. Case discussed with Doctor Onalee Hua, Will admit.    Gilda Crease, MD 05/28/13 2017

## 2013-05-28 NOTE — ED Notes (Signed)
Debbie from lab called with critical K+2.7.  Lauren RN and Pollina EDP notified

## 2013-05-28 NOTE — H&P (Signed)
PCP:   No Pcp   Chief Complaint:  sob  HPI: 37 yo female h/o ashtma, osa, htn, morbid obesity comes in for the second ED visit for worsening sob and wheezing that has been occurring for about one week.  No n/v.  No fevers.  She was seen in ED last night given po steroids and zpack and went home but got worse today despite adequate outpt therapy with the above and bronchodilators.  Denies any swelling or edema to ble.  Has chronic sinusitis and nasal polyps for which contributes a lot to her asthma exac and is undergoing evaluation with ENT for sinus surgery in the near future.  Review of Systems:  Positive and negative as per HPI otherwise all other systems are negative  Past Medical History: Past Medical History  Diagnosis Date  . Asthma   . Hypertension   . Chronic anemia   . Abnormal TSH   . Morbid obesity   . GERD (gastroesophageal reflux disease)   . Seasonal allergies   . Nasal polyps   . Diabetes mellitus     type 2  . Chronic bronchitis   . Sleep apnea   . Shortness of breath 12/14/11    "related to how bad my asthma is; sometimes @ rest, lying down, w/exertion"  . Pneumonia   . Blood transfusion   . Inguinal hernia unilateral, non-recurrent     right  . Kidney infection 2008  . Headache(784.0)   . Anxiety   . Depression   . PTSD (post-traumatic stress disorder)     "related to prior hospitalizations; things that happened when I was in hospital"  . Asthma    Past Surgical History  Procedure Laterality Date  . Cervical cerclage  1999; 2002  . Tubal ligation  2002  . Cervical cerclage    . Tubal ligation      Medications: Prior to Admission medications   Medication Sig Start Date End Date Taking? Authorizing Provider  albuterol (PROVENTIL HFA;VENTOLIN HFA) 108 (90 BASE) MCG/ACT inhaler Inhale 2 puffs into the lungs every 6 (six) hours as needed for wheezing or shortness of breath.   Yes Historical Provider, MD  azithromycin (ZITHROMAX) 250 MG tablet Take  250-500 mg by mouth daily. 2 tabs on Day 1, then 1 tab on Days 2-5; started 05/28/13.   Yes Historical Provider, MD  budesonide-formoterol (SYMBICORT) 160-4.5 MCG/ACT inhaler Inhale 2 puffs into the lungs 2 (two) times daily.   Yes Historical Provider, MD  diphenhydrAMINE (BENADRYL) 25 mg capsule Take 25 mg by mouth daily as needed for allergies.    Yes Historical Provider, MD  ferrous sulfate 325 (65 FE) MG tablet Take 1 tablet (325 mg total) by mouth 3 (three) times daily with meals. 03/16/13  Yes Leroy Sea, MD  folic acid (FOLVITE) 400 MCG tablet Take 400 mcg by mouth every morning.    Yes Historical Provider, MD  ipratropium-albuterol (DUONEB) 0.5-2.5 (3) MG/3ML SOLN Take 3 mLs by nebulization every 4 (four) hours as needed (for wheezing or shortness of breath).  09/01/12  Yes Heather Zenaida Niece Wingen, PA-C  irbesartan-hydrochlorothiazide (AVALIDE) 300-25 MG per tablet Take 1 tablet by mouth every morning.    Yes Historical Provider, MD  Potassium 99 MG TABS Take 1 tablet by mouth 4 (four) times daily.    Yes Historical Provider, MD  predniSONE (DELTASONE) 20 MG tablet Take 1 tablet (20 mg total) by mouth 2 (two) times daily. 05/27/13  Yes Flint Melter, MD  ranitidine (ZANTAC) 150 MG capsule Take 150 mg by mouth daily. For heartburn   Yes Historical Provider, MD    Allergies:   Allergies  Allergen Reactions  . Aspirin Anaphylaxis  . Aspirin Anaphylaxis  . Ibuprofen Anaphylaxis  . Shellfish Allergy Hives and Swelling    Facial and oral swelling  . Menthol Swelling    Tongue swelling  . Other Other (See Comments)    Plastic thermometer covers cause sloughing off of skin on tongue and inside mouth    Social History:  reports that she has never smoked. She has never used smokeless tobacco. She reports that she does not drink alcohol or use illicit drugs.  Family History: Family History  Problem Relation Age of Onset  . Diabetes Mother   . Heart disease Father     Physical  Exam: Filed Vitals:   05/28/13 1745 05/28/13 1800 05/28/13 1815 05/28/13 1830  BP:      Pulse: 136 131 125 130  Temp:      TempSrc:      Resp: 27 25 25 23   SpO2: 100% 100% 100% 93%   General appearance: alert, cooperative and mild distress can speak in full sentences Head: Normocephalic, without obvious abnormality, atraumatic Eyes: negative Nose: Nares normal. Septum midline. Mucosa normal. No drainage or sinus tenderness. Neck: no JVD and supple, symmetrical, trachea midline Lungs: wheezes bilaterally Heart: regular rate and rhythm, S1, S2 normal, no murmur, click, rub or gallop Abdomen: soft, non-tender; bowel sounds normal; no masses,  no organomegaly Extremities: extremities normal, atraumatic, no cyanosis or edema Pulses: 2+ and symmetric Skin: Skin color, texture, turgor normal. No rashes or lesions Neurologic: Grossly normal    Labs on Admission:   Recent Labs  05/27/13 1841 05/28/13 1821  NA 138 140  K 2.3* 2.7*  CL 103 104  CO2 24 20  GLUCOSE 131* 142*  BUN 8 7  CREATININE 0.81 0.72  CALCIUM 9.4 9.6    Recent Labs  05/27/13 1841 05/28/13 1821  WBC 8.7 11.2*  NEUTROABS 3.8 8.7*  HGB 9.4* 9.9*  HCT 32.0* 32.7*  MCV 66.8* 66.5*  PLT 357 325    Recent Labs  05/28/13 1821  TROPONINI <0.30   Radiological Exams on Admission: Dg Chest 2 View  05/27/2013   *RADIOLOGY REPORT*  Clinical Data: Shortness of breath, asthma  CHEST - 2 VIEW  Comparison: Prior radiograph from 04/16/2013  Findings: Cardiac and mediastinal silhouettes are able in size and contour, and remain within normal limits.  Mild central peribronchial thickening, unchanged. No new pulmonary parenchymal abnormalities. There is no pneumothorax.  IMPRESSION: Stable mild changes of chronic bronchitis and/or asthma. No acute cardiopulmonary disease.   Original Report Authenticated By: Rise Mu, M.D.   Dg Chest Port 1 View  05/28/2013   *RADIOLOGY REPORT*  Clinical Data: Cough,  shortness of breath  PORTABLE CHEST - 1 VIEW  Comparison: 05/27/2013  Findings: The heart size and vascular pattern are normal.  The lungs are clear.  IMPRESSION: No acute abnormalities   Original Report Authenticated By: Esperanza Heir, M.D.    Assessment/Plan  37 yo female with mild acute hypoxic resp failure secondary to acute asthma exac despite outpt tx Principal Problem:   Acute respiratory failure with hypoxia Active Problems:   HTN (hypertension), malignant   GERD (gastroesophageal reflux disease)   Tachycardia   Acute asthma exacerbation   Hypokalemia   Iron deficiency anemia   Obstructive sleep apnea  Place on iv solumedrol and azithro.  cxr repeated today without infiltrate.  Replace k orally.  freq nebs.  Supplemental oxygen prn.  Full code.  Tele bed.  Garrett Bowring A 05/28/2013, 8:17 PM

## 2013-05-29 DIAGNOSIS — D509 Iron deficiency anemia, unspecified: Secondary | ICD-10-CM

## 2013-05-29 DIAGNOSIS — D638 Anemia in other chronic diseases classified elsewhere: Secondary | ICD-10-CM

## 2013-05-29 DIAGNOSIS — K219 Gastro-esophageal reflux disease without esophagitis: Secondary | ICD-10-CM

## 2013-05-29 DIAGNOSIS — J40 Bronchitis, not specified as acute or chronic: Secondary | ICD-10-CM

## 2013-05-29 DIAGNOSIS — R7309 Other abnormal glucose: Secondary | ICD-10-CM

## 2013-05-29 DIAGNOSIS — I10 Essential (primary) hypertension: Secondary | ICD-10-CM

## 2013-05-29 LAB — CBC
HCT: 29.4 % — ABNORMAL LOW (ref 36.0–46.0)
MCHC: 29.9 g/dL — ABNORMAL LOW (ref 30.0–36.0)
MCV: 67.6 fL — ABNORMAL LOW (ref 78.0–100.0)
RDW: 21.6 % — ABNORMAL HIGH (ref 11.5–15.5)

## 2013-05-29 LAB — BASIC METABOLIC PANEL
BUN: 10 mg/dL (ref 6–23)
CO2: 20 mEq/L (ref 19–32)
Chloride: 103 mEq/L (ref 96–112)
Creatinine, Ser: 0.73 mg/dL (ref 0.50–1.10)
GFR calc Af Amer: >90 mL/min (ref >90)

## 2013-05-29 LAB — HEMOGLOBIN A1C
Hgb A1c MFr Bld: 6.6 % — ABNORMAL HIGH (ref <5.7)
Mean Plasma Glucose: 143 mg/dL — ABNORMAL HIGH (ref <117)

## 2013-05-29 LAB — GLUCOSE, CAPILLARY
Glucose-Capillary: 161 mg/dL — ABNORMAL HIGH (ref 70–99)
Glucose-Capillary: 143 mg/dL — ABNORMAL HIGH (ref 70–99)

## 2013-05-29 MED ORDER — FLUTICASONE PROPIONATE 50 MCG/ACT NA SUSP
2.0000 | Freq: Every day | NASAL | Status: DC
  Filled 2013-05-29: qty 16

## 2013-05-29 MED ORDER — MUPIROCIN 2 % EX OINT
1.0000 "application " | TOPICAL_OINTMENT | Freq: Two times a day (BID) | CUTANEOUS | Status: DC
  Administered 2013-05-29 – 2013-05-30 (×4): 1 via NASAL
  Filled 2013-05-29: qty 22

## 2013-05-29 MED ORDER — IPRATROPIUM BROMIDE 0.02 % IN SOLN
0.5000 mg | RESPIRATORY_TRACT | Status: DC
  Filled 2013-05-29: qty 2.5

## 2013-05-29 MED ORDER — ALBUTEROL SULFATE (5 MG/ML) 0.5% IN NEBU
2.5000 mg | INHALATION_SOLUTION | RESPIRATORY_TRACT | Status: DC
  Administered 2013-05-29 – 2013-05-30 (×3): 2.5 mg via RESPIRATORY_TRACT
  Filled 2013-05-29 (×5): qty 0.5

## 2013-05-29 MED ORDER — INSULIN ASPART 100 UNIT/ML ~~LOC~~ SOLN
0.0000 [IU] | Freq: Three times a day (TID) | SUBCUTANEOUS | Status: DC
  Administered 2013-05-30: 5 [IU] via SUBCUTANEOUS

## 2013-05-29 MED ORDER — PREDNISONE 10 MG PO TABS
60.0000 mg | ORAL_TABLET | Freq: Two times a day (BID) | ORAL | Status: DC
  Filled 2013-05-29: qty 1

## 2013-05-29 MED ORDER — AZITHROMYCIN 250 MG PO TABS
250.0000 mg | ORAL_TABLET | Freq: Every day | ORAL | Status: DC
  Administered 2013-05-29: 250 mg via ORAL
  Filled 2013-05-29 (×2): qty 1

## 2013-05-29 MED ORDER — PREDNISONE 50 MG PO TABS
60.0000 mg | ORAL_TABLET | Freq: Two times a day (BID) | ORAL | Status: DC
  Administered 2013-05-29 – 2013-05-30 (×2): 60 mg via ORAL
  Filled 2013-05-29 (×4): qty 1

## 2013-05-29 MED ORDER — FLUTICASONE PROPIONATE 50 MCG/ACT NA SUSP
2.0000 | Freq: Every day | NASAL | Status: DC
Start: 2013-05-29 — End: 2013-05-30
  Administered 2013-05-29 – 2013-05-30 (×2): 2 via NASAL
  Filled 2013-05-29: qty 16

## 2013-05-29 MED ORDER — CHLORHEXIDINE GLUCONATE CLOTH 2 % EX PADS: 6.0000 | MEDICATED_PAD | Freq: Every day | CUTANEOUS | Status: DC

## 2013-05-29 NOTE — Progress Notes (Signed)
IV team unsuccessful with IV attempt. Paged MD.

## 2013-05-29 NOTE — Care Management Note (Signed)
    Page 1 of 1   05/29/2013     2:19:10 PM   CARE MANAGEMENT NOTE 05/29/2013  Patient:  [name] [name],[name] [name]   Account Number:  [id number]  Date Initiated:  05/29/2013  Documentation initiated by:  Lanier Clam  Subjective/Objective Assessment:   ADMITTED W/SOB.ASTHMA EXAC.     Action/Plan:   FROM HOME.NO PCP.   Anticipated DC Date:  06/01/2013   Anticipated DC Plan:  HOME/SELF CARE      DC Planning Services  CM consult      Choice offered to / List presented to:             Status of service:  Completed, signed off Medicare Important Message given?   (If response is "NO", the following Medicare IM given date fields will be blank) Date Medicare IM given:   Date Additional Medicare IM given:    Discharge Disposition:    Per UR Regulation:  Reviewed for med. necessity/level of care/duration of stay  If discussed at Long Length of Stay Meetings, dates discussed:    Comments:  05/29/13 Marco Raper RN,BSN NCM [phone] NOTED ED CM PROVIDEDW/PCP LISTING ALREADY.

## 2013-05-29 NOTE — Progress Notes (Addendum)
[facility] PROGRESS NOTE  [name] [name] ZOX:[id number] DOB: 1976/01/14 DOA: 05/28/2013 PCP: No Pcp  Assessment/Plan: 1. Acute asthma exacerbation - Continue albuterol, Azithromycin, Symbicort, Atrovent - Will continue solumedrol at current dose 80 mg IV BID. Patient still wheezing considerably, but looking comfortable on room air. - supplemental oxygen PRN.   2. GERD: - stable currently, no new complaints.  3. HTN: - Patient is on Avalide at home. Currently not on any antihypertensive medication. - Will repeat vitals at this juncture.  Will reassess and decide whether or not to start antihypertensive medication.  4. Hypokalemia: - Continue current potassium replacement - recheck next am.  If potassium within normal limits would discontinue potassium replacement.  5. Iron deficiency anemia: - No active bleeding - Will continue ferrous sulfate - stable  Code Status: full Family Communication: No family at bedside Disposition Plan: Pending improvement in respiratory condition.   Consultants:  none  Procedures:  none  Antibiotics: Azithromycin  HPI/Subjective: Patient reports feeling better. No acute issues reported overnight.  Objective: Filed Vitals:   05/28/13 1830 05/28/13 2040 05/28/13 2231 05/29/13 0847  BP:  155/88 168/93   Pulse: 130 124 122   Temp:   98.3 F (36.8 C)   TempSrc:   Axillary   Resp: 23 95 22   Height:   5\' 6"  (1.676 m)   Weight:   133.4 kg (294 lb 1.5 oz)   SpO2: 93% 95% 98% 94%    Intake/Output Summary (Last 24 hours) at 05/29/13 1257 Last data filed at 05/29/13 8119  Gross per 24 hour  Intake      0 ml  Output    800 ml  Net   -800 ml   Filed Weights   05/28/13 2231  Weight: 133.4 kg (294 lb 1.5 oz)    Exam:   General:  Pt in NAD, Alert and Awake  Cardiovascular: RRR, no MRG  Respiratory: Breath sounds BL, expiratory wheezes diffusely  Abdomen: soft, obese, NT  Musculoskeletal: no cyanosis or clubbing    Data Reviewed: Basic Metabolic Panel:  Recent Labs Lab 05/27/13 1841 05/28/13 1821 05/29/13 0443  NA 138 140 136  K 2.3* 2.7* 3.4*  CL 103 104 103  CO2 24 20 20   GLUCOSE 131* 142* 341*  BUN 8 7 10   CREATININE 0.81 0.72 0.73  CALCIUM 9.4 9.6 9.0   Liver Function Tests: No results found for this basename: AST, ALT, ALKPHOS, BILITOT, PROT, ALBUMIN,  in the last 168 hours No results found for this basename: LIPASE, AMYLASE,  in the last 168 hours No results found for this basename: AMMONIA,  in the last 168 hours CBC:  Recent Labs Lab 05/27/13 1841 05/28/13 1821 05/29/13 0443  WBC 8.7 11.2* 9.7  NEUTROABS 3.8 8.7*  --   HGB 9.4* 9.9* 8.8*  HCT 32.0* 32.7* 29.4*  MCV 66.8* 66.5* 67.6*  PLT 357 325 331   Cardiac Enzymes:  Recent Labs Lab 05/28/13 1821  TROPONINI <0.30   BNP (last 3 results)  Recent Labs  05/28/13 1821  PROBNP 56.7   CBG: No results found for this basename: GLUCAP,  in the last 168 hours  Recent Results (from the past 240 hour(s))  MRSA PCR SCREENING     Status: Abnormal   Collection Time    05/29/13 12:22 AM      Result Value Range Status   MRSA by PCR POSITIVE (*) NEGATIVE Final   Comment:  The GeneXpert MRSA Assay (FDA     approved for NASAL specimens     only), is one component of a     comprehensive MRSA colonization     surveillance program. It is not     intended to diagnose MRSA     infection nor to guide or     monitor treatment for     MRSA infections.     RESULT CALLED TO, READ BACK BY AND VERIFIED WITH:     MILLER,T/4W @0159  ON 05/29/13 BY KARCZEWSKI,S.     Studies: Dg Chest 2 View  05/27/2013   *RADIOLOGY REPORT*  Clinical Data: Shortness of breath, asthma  CHEST - 2 VIEW  Comparison: Prior radiograph from 04/16/2013  Findings: Cardiac and mediastinal silhouettes are able in size and contour, and remain within normal limits.  Mild central peribronchial thickening, unchanged. No new pulmonary parenchymal  abnormalities. There is no pneumothorax.  IMPRESSION: Stable mild changes of chronic bronchitis and/or asthma. No acute cardiopulmonary disease.   Original Report Authenticated By: Rise Mu, M.D.   Dg Chest Port 1 View  05/28/2013   *RADIOLOGY REPORT*  Clinical Data: Cough, shortness of breath  PORTABLE CHEST - 1 VIEW  Comparison: 05/27/2013  Findings: The heart size and vascular pattern are normal.  The lungs are clear.  IMPRESSION: No acute abnormalities   Original Report Authenticated By: Esperanza Heir, M.D.    Scheduled Meds: . albuterol  2.5 mg Nebulization Q6H  . azithromycin  250 mg Intravenous QHS  . budesonide-formoterol  2 puff Inhalation BID  . Chlorhexidine Gluconate Cloth  6 each Topical Q0600  . enoxaparin (LOVENOX) injection  40 mg Subcutaneous QHS  . ferrous sulfate  325 mg Oral TID WC  . guaiFENesin  600 mg Oral BID  . ipratropium  0.5 mg Nebulization Q6H  . methylPREDNISolone (SOLU-MEDROL) injection  80 mg Intravenous BID  . mupirocin ointment  1 application Nasal BID  . potassium chloride  20 mEq Oral BID  . sodium chloride  3 mL Intravenous Q12H  . sodium chloride  3 mL Intravenous Q12H   Continuous Infusions:   Principal Problem:   Acute respiratory failure with hypoxia Active Problems:   Tachycardia   HTN (hypertension), malignant   GERD (gastroesophageal reflux disease)   Acute asthma exacerbation   Hypokalemia   Iron deficiency anemia   Obstructive sleep apnea    Time spent: > 35 minutes    [name] [name]  [facility] Pager [phone] If 7PM-7AM, please contact night-coverage at [email or web address], password [facility] 05/29/2013, 12:57 PM  LOS: 1 day    Addendum Hyperglycemia Paged by nursing who reported a blood sugar of 341.  Will order hgba1c and place on SSI.  - diabetic diet

## 2013-05-29 NOTE — Progress Notes (Signed)
Inpatient Diabetes Program Recommendations  AACE/ADA: New Consensus Statement on Inpatient Glycemic Control (2013)  Target Ranges:  Prepandial:   less than 140 mg/dL      Peak postprandial:   less than 180 mg/dL (1-2 hours)      Critically ill patients:  140 - 180 mg/dL   Results for PHILIPPA, VESSEY (MRN [id number]) as of 05/29/2013 14:39  Ref. Range 05/27/2013 18:41 05/28/2013 18:21 05/29/2013 04:43  Glucose Latest Range: 70-99 mg/dL 409 (H) 811 (H) 914 (H)    Inpatient Diabetes Program Recommendations Correction (SSI): Please order CBGs with Novolog moderate correction scale while inpatient and on steroids.  Note: Patient has a history of diabetes but is not on any medication for diabetes control as an outpatient.  Initial lab glucose noted to be 131 mg/dl on 7/82 @ 95:62 and fasting lab glucose this morning ws 341 mg/dl.  Patient is ordered to receive Solumedrol 80mg  IV BID which is likely contributing to hyperglycemia.  Please order CBGs with Novolog moderate correction scale ACHS while inpatient and on steroids.  Will continue to follow.  Thanks, Orlando Penner, RN, MSN, CCRN Diabetes Coordinator Inpatient Diabetes Program [phone]

## 2013-05-29 NOTE — Progress Notes (Signed)
Pt IV came out accidentally earlier. Pt is very hardstick, IV team came and was unsuccessful. MD notified. Pt to encourage to drink plenty of fluids then try IV restarted again. IV team aware.

## 2013-05-30 LAB — BASIC METABOLIC PANEL
Calcium: 9.1 mg/dL (ref 8.4–10.5)
Creatinine, Ser: 0.76 mg/dL (ref 0.50–1.10)
GFR calc Af Amer: >90 mL/min (ref >90)
GFR calc non Af Amer: >90 mL/min (ref >90)
Sodium: 138 mEq/L (ref 135–145)

## 2013-05-30 MED ORDER — IPRATROPIUM BROMIDE 0.02 % IN SOLN
0.5000 mg | RESPIRATORY_TRACT | Status: DC
  Administered 2013-05-30 (×2): 0.5 mg via RESPIRATORY_TRACT
  Filled 2013-05-30 (×3): qty 2.5

## 2013-05-30 MED ORDER — FLUTICASONE PROPIONATE 50 MCG/ACT NA SUSP: 2.0000 | Freq: Every day | NASAL | Status: AC

## 2013-05-30 MED ORDER — PREDNISONE (PAK) 10 MG PO TABS: 10.0000 mg | ORAL_TABLET | Freq: Every day | ORAL | Status: DC

## 2013-05-30 NOTE — Discharge Summary (Signed)
Physician Discharge Summary  [name] [name] YQM:[id number] DOB: [birth year] DOA: 05/28/2013  PCP: No Pcp  Admit date: 05/28/2013 Discharge date: 05/30/2013  Time spent: Greater than 30 minutes  Recommendations for Outpatient Follow-up:  -Advised to follow up with PCP in 2 weeks and with ENT as scheduled prior to admission.    Discharge Diagnoses:  Principal Problem:   Acute respiratory failure with hypoxia Active Problems:   HTN (hypertension), malignant   GERD (gastroesophageal reflux disease)   Tachycardia   Acute asthma exacerbation   Hypokalemia   Iron deficiency anemia   Obstructive sleep apnea   Hyperglycemia   Discharge Condition: Stable and improved.  Filed Weights   05/28/13 2231  Weight: 133.4 kg (294 lb 1.5 oz)    History of present illness:  Patient is a 37 yo female h/o ashtma, osa, htn, morbid obesity comes in for the second ED visit for worsening sob and wheezing that has been occurring for about one week. No n/v. No fevers. She was seen in ED last night given po steroids and zpack and went home but got worse today despite adequate outpt therapy with the above and bronchodilators. Denies any swelling or edema to ble. Has chronic sinusitis and nasal polyps for which contributes a lot to her asthma exac and is undergoing evaluation with ENT for sinus surgery in the near future. We were asked to admit her for further evaluation and management.   Hospital Course:   Acute Hypoxic Respiratory Failure -Resolved. -2/2 asthma exacerbation.  Asthma with Acute Exacerbation -No wheezing today. -Patient feels she is back to her baseline. -Will DC on symbicort, PRN albuterol and a steroid taper.  HTN -Continue home meds.  Hypokalemia -Repleted.  Iron Deficiency Anemia -Continue chronic iron supplementation.  Procedures:  None   Consultations:  None  Discharge Instructions  Discharge Orders   Future Orders Complete By Expires     Diet - low sodium heart  healthy  As directed     Discontinue IV  As directed     Increase activity slowly  As directed         Medication List    STOP taking these medications       azithromycin 250 MG tablet  Commonly known as:  ZITHROMAX     predniSONE 20 MG tablet  Commonly known as:  DELTASONE  Replaced by:  predniSONE 10 MG tablet      TAKE these medications       albuterol 108 (90 BASE) MCG/ACT inhaler  Commonly known as:  PROVENTIL HFA;VENTOLIN HFA  Inhale 2 puffs into the lungs every 6 (six) hours as needed for wheezing or shortness of breath.     budesonide-formoterol 160-4.5 MCG/ACT inhaler  Commonly known as:  SYMBICORT  Inhale 2 puffs into the lungs 2 (two) times daily.     diphenhydrAMINE 25 mg capsule  Commonly known as:  BENADRYL  Take 25 mg by mouth daily as needed for allergies.     ferrous sulfate 325 (65 FE) MG tablet  Take 1 tablet (325 mg total) by mouth 3 (three) times daily with meals.     fluticasone 50 MCG/ACT nasal spray  Commonly known as:  FLONASE  Place 2 sprays into the nose daily.     folic acid 400 MCG tablet  Commonly known as:  FOLVITE  Take 400 mcg by mouth every morning.     ipratropium-albuterol 0.5-2.5 (3) MG/3ML Soln  Commonly known as:  DUONEB  Take 3 mLs by nebulization  every 4 (four) hours as needed (for wheezing or shortness of breath).     irbesartan-hydrochlorothiazide 300-25 MG per tablet  Commonly known as:  AVALIDE  Take 1 tablet by mouth every morning.     Potassium 99 MG Tabs  Take 1 tablet by mouth 4 (four) times daily.     predniSONE 10 MG tablet  Commonly known as:  STERAPRED UNI-PAK  Take 1 tablet (10 mg total) by mouth daily. Take 6 tablets today, then decrease by 1 tablet daily until are all gone.     ranitidine 150 MG capsule  Commonly known as:  ZANTAC  Take 150 mg by mouth daily. For heartburn       Allergies  Allergen Reactions  . Aspirin Anaphylaxis  . Aspirin Anaphylaxis  . Ibuprofen Anaphylaxis  . Shellfish  Allergy Hives and Swelling    Facial and oral swelling  . Menthol Swelling    Tongue swelling  . Other Other (See Comments)    Plastic thermometer covers cause sloughing off of skin on tongue and inside mouth       Follow-up Information   Schedule an appointment as soon as possible for a visit in 2 weeks to follow up. (With your regular provider)        The results of significant diagnostics from this hospitalization (including imaging, microbiology, ancillary and laboratory) are listed below for reference.    Significant Diagnostic Studies: Dg Chest 2 View  05/27/2013   *RADIOLOGY REPORT*  Clinical Data: Shortness of breath, asthma  CHEST - 2 VIEW  Comparison: Prior radiograph from 04/16/2013  Findings: Cardiac and mediastinal silhouettes are able in size and contour, and remain within normal limits.  Mild central peribronchial thickening, unchanged. No new pulmonary parenchymal abnormalities. There is no pneumothorax.  IMPRESSION: Stable mild changes of chronic bronchitis and/or asthma. No acute cardiopulmonary disease.   Original Report Authenticated By: Rise Mu, M.D.   Dg Chest Port 1 View  05/28/2013   *RADIOLOGY REPORT*  Clinical Data: Cough, shortness of breath  PORTABLE CHEST - 1 VIEW  Comparison: 05/27/2013  Findings: The heart size and vascular pattern are normal.  The lungs are clear.  IMPRESSION: No acute abnormalities   Original Report Authenticated By: Esperanza Heir, M.D.    Microbiology: Recent Results (from the past 240 hour(s))  MRSA PCR SCREENING     Status: Abnormal   Collection Time    05/29/13 12:22 AM      Result Value Range Status   MRSA by PCR POSITIVE (*) NEGATIVE Final   Comment:            The GeneXpert MRSA Assay (FDA     approved for NASAL specimens     only), is one component of a     comprehensive MRSA colonization     surveillance program. It is not     intended to diagnose MRSA     infection nor to guide or     monitor treatment  for     MRSA infections.     RESULT CALLED TO, READ BACK BY AND VERIFIED WITH:     MILLER,T/4W @0159  ON 05/29/13 BY KARCZEWSKI,S.     Labs: Basic Metabolic Panel:  Recent Labs Lab 05/27/13 1841 05/28/13 1821 05/29/13 0443 05/30/13 0355  NA 138 140 136 138  K 2.3* 2.7* 3.4* 3.5  CL 103 104 103 105  CO2 24 20 20 26   GLUCOSE 131* 142* 341* 231*  BUN 8 7 10 10   CREATININE  0.81 0.72 0.73 0.76  CALCIUM 9.4 9.6 9.0 9.1   Liver Function Tests: No results found for this basename: AST, ALT, ALKPHOS, BILITOT, PROT, ALBUMIN,  in the last 168 hours No results found for this basename: LIPASE, AMYLASE,  in the last 168 hours No results found for this basename: AMMONIA,  in the last 168 hours CBC:  Recent Labs Lab 05/27/13 1841 05/28/13 1821 05/29/13 0443  WBC 8.7 11.2* 9.7  NEUTROABS 3.8 8.7*  --   HGB 9.4* 9.9* 8.8*  HCT 32.0* 32.7* 29.4*  MCV 66.8* 66.5* 67.6*  PLT 357 325 331   Cardiac Enzymes:  Recent Labs Lab 05/28/13 1821  TROPONINI <0.30   BNP: BNP (last 3 results)  Recent Labs  05/28/13 1821  PROBNP 56.7   CBG:  Recent Labs Lab 05/29/13 1856 05/29/13 2251 05/30/13 0801  GLUCAP 161* 143* 204*       Signed:  HERNANDEZ ACOSTA,ESTELA  [facility] Pager: [phone] 05/30/2013, 10:40 AM

## 2013-07-11 ENCOUNTER — Emergency Department (HOSPITAL_COMMUNITY): Payer: Medicare Other

## 2013-07-11 ENCOUNTER — Emergency Department (HOSPITAL_COMMUNITY)
Admission: EM | Admit: 2013-07-11 | Discharge: 2013-07-12 | Disposition: A | Discharge status: Home / Self Care | Payer: Medicare Other | Attending: Emergency Medicine | Admitting: Emergency Medicine

## 2013-07-11 DIAGNOSIS — R059 Cough, unspecified: Secondary | ICD-10-CM | POA: Insufficient documentation

## 2013-07-11 DIAGNOSIS — Z8719 Personal history of other diseases of the digestive system: Secondary | ICD-10-CM | POA: Insufficient documentation

## 2013-07-11 DIAGNOSIS — G8929 Other chronic pain: Secondary | ICD-10-CM | POA: Insufficient documentation

## 2013-07-11 DIAGNOSIS — R05 Cough: Secondary | ICD-10-CM | POA: Insufficient documentation

## 2013-07-11 DIAGNOSIS — D649 Anemia, unspecified: Secondary | ICD-10-CM | POA: Insufficient documentation

## 2013-07-11 DIAGNOSIS — J339 Nasal polyp, unspecified: Secondary | ICD-10-CM | POA: Insufficient documentation

## 2013-07-11 DIAGNOSIS — Z8659 Personal history of other mental and behavioral disorders: Secondary | ICD-10-CM | POA: Insufficient documentation

## 2013-07-11 DIAGNOSIS — I10 Essential (primary) hypertension: Secondary | ICD-10-CM | POA: Insufficient documentation

## 2013-07-11 DIAGNOSIS — Z8701 Personal history of pneumonia (recurrent): Secondary | ICD-10-CM | POA: Insufficient documentation

## 2013-07-11 DIAGNOSIS — Z8669 Personal history of other diseases of the nervous system and sense organs: Secondary | ICD-10-CM | POA: Insufficient documentation

## 2013-07-11 DIAGNOSIS — R509 Fever, unspecified: Secondary | ICD-10-CM | POA: Insufficient documentation

## 2013-07-11 DIAGNOSIS — R0602 Shortness of breath: Secondary | ICD-10-CM | POA: Insufficient documentation

## 2013-07-11 DIAGNOSIS — K219 Gastro-esophageal reflux disease without esophagitis: Secondary | ICD-10-CM | POA: Insufficient documentation

## 2013-07-11 DIAGNOSIS — D638 Anemia in other chronic diseases classified elsewhere: Secondary | ICD-10-CM

## 2013-07-11 DIAGNOSIS — E876 Hypokalemia: Secondary | ICD-10-CM | POA: Insufficient documentation

## 2013-07-11 DIAGNOSIS — IMO0002 Reserved for concepts with insufficient information to code with codable children: Secondary | ICD-10-CM | POA: Insufficient documentation

## 2013-07-11 DIAGNOSIS — Z87448 Personal history of other diseases of urinary system: Secondary | ICD-10-CM | POA: Insufficient documentation

## 2013-07-11 DIAGNOSIS — Z79899 Other long term (current) drug therapy: Secondary | ICD-10-CM | POA: Insufficient documentation

## 2013-07-11 DIAGNOSIS — E119 Type 2 diabetes mellitus without complications: Secondary | ICD-10-CM | POA: Insufficient documentation

## 2013-07-11 DIAGNOSIS — J3489 Other specified disorders of nose and nasal sinuses: Secondary | ICD-10-CM | POA: Insufficient documentation

## 2013-07-11 DIAGNOSIS — R000 Tachycardia, unspecified: Secondary | ICD-10-CM | POA: Insufficient documentation

## 2013-07-11 DIAGNOSIS — J45901 Unspecified asthma with (acute) exacerbation: Secondary | ICD-10-CM

## 2013-07-11 DIAGNOSIS — Z8709 Personal history of other diseases of the respiratory system: Secondary | ICD-10-CM | POA: Insufficient documentation

## 2013-07-11 DIAGNOSIS — R0789 Other chest pain: Secondary | ICD-10-CM | POA: Insufficient documentation

## 2013-07-11 LAB — BASIC METABOLIC PANEL
BUN: 6 mg/dL (ref 6–23)
Chloride: 101 mEq/L (ref 96–112)
Creatinine, Ser: 0.78 mg/dL (ref 0.50–1.10)
GFR calc Af Amer: >90 mL/min (ref >90)
GFR calc non Af Amer: >90 mL/min (ref >90)
Glucose, Bld: 178 mg/dL — ABNORMAL HIGH (ref 70–99)
Potassium: 2.7 mEq/L — CL (ref 3.5–5.1)

## 2013-07-11 LAB — CBC
HCT: 28.3 % — ABNORMAL LOW (ref 36.0–46.0)
Hemoglobin: 8.2 g/dL — ABNORMAL LOW (ref 12.0–15.0)
MCHC: 29 g/dL — ABNORMAL LOW (ref 30.0–36.0)
MCV: 65.5 fL — ABNORMAL LOW (ref 78.0–100.0)
RDW: 19 % — ABNORMAL HIGH (ref 11.5–15.5)

## 2013-07-11 MED ORDER — POTASSIUM CHLORIDE CRYS ER 20 MEQ PO TBCR
40.0000 meq | EXTENDED_RELEASE_TABLET | Freq: Once | ORAL | Status: AC
  Administered 2013-07-11: 40 meq via ORAL
  Filled 2013-07-11: qty 2

## 2013-07-11 MED ORDER — METHYLPREDNISOLONE SODIUM SUCC 125 MG IJ SOLR
125.0000 mg | Freq: Once | INTRAMUSCULAR | Status: AC
  Administered 2013-07-11: 125 mg via INTRAVENOUS
  Filled 2013-07-11: qty 2

## 2013-07-11 MED ORDER — ALBUTEROL (5 MG/ML) CONTINUOUS INHALATION SOLN
10.0000 mg/h | INHALATION_SOLUTION | RESPIRATORY_TRACT | Status: AC
  Administered 2013-07-11: 10 mg/h via RESPIRATORY_TRACT
  Filled 2013-07-11: qty 20

## 2013-07-11 MED ORDER — IPRATROPIUM BROMIDE 0.02 % IN SOLN
1.0000 mg | Freq: Once | RESPIRATORY_TRACT | Status: AC
  Administered 2013-07-11: 1 mg via RESPIRATORY_TRACT
  Filled 2013-07-11: qty 5

## 2013-07-11 NOTE — ED Notes (Signed)
Pt talking boisterously on telephone. Sats remain at 97% RA.

## 2013-07-11 NOTE — ED Notes (Signed)
Bed: WA02 Expected date: 07/11/13 Expected time: 9:41 PM Means of arrival: Ambulance Comments: 37 yo F  Asthma

## 2013-07-11 NOTE — ED Provider Notes (Signed)
CSN: [id number]     Arrival date & time 07/11/13  2150 History     First MD Initiated Contact with Patient 07/11/13 2153     Chief Complaint  Patient presents with  . Shortness of Breath  . Asthma   (Consider location/radiation/quality/duration/timing/severity/associated sxs/prior Treatment) The history is provided by the patient and medical records. No language interpreter was used.    [name] [name] is a 37 y.o. female  with a hx of asthma, nasal polyps, HTN, GERD presents to the Emergency Department complaining of gradual, persistent, progressively worsening SOB and wheezing.  Pt endorses seasonal allergies and URI symptoms for approx 1 week.  She reports cough for this length of time as well.  Pt has an appt with ENT next week for nasal polyp removal.  Pt reports using 3 albuterol treatments today without relief.  Pt has been hospitalized for her asthma and was intubated 2 years ago for an exacerbation. Nothing makes it better and exertion makes it worse.  Pt endorses subjective fever and chills today.  Pt denies headache, neck pain, chest pain, abd pain, N/V/D, weakness, dizziness, syncope, dysuria, hematuria.  Pt with BTL 12 years ago.  Past Medical History  Diagnosis Date  . Asthma   . Hypertension   . Chronic anemia   . Abnormal TSH   . Morbid obesity   . GERD (gastroesophageal reflux disease)   . Seasonal allergies   . Nasal polyps   . Diabetes mellitus     type 2  . Chronic bronchitis   . Sleep apnea   . Shortness of breath 12/14/11    "related to how bad my asthma is; sometimes @ rest, lying down, w/exertion"  . Pneumonia   . Blood transfusion   . Inguinal hernia unilateral, non-recurrent     right  . Kidney infection 2008  . Headache(784.0)   . Anxiety   . Depression   . PTSD (post-traumatic stress disorder)     "related to prior hospitalizations; things that happened when I was in hospital"  . Asthma    Past Surgical History  Procedure Laterality Date  .  Cervical cerclage  1999; 2002  . Tubal ligation  2002  . Cervical cerclage    . Tubal ligation     Family History  Problem Relation Age of Onset  . Diabetes Mother   . Heart disease Father    History  Substance Use Topics  . Smoking status: Never Smoker   . Smokeless tobacco: Never Used  . Alcohol Use: No     Comment: 12/14/11 "only on birthdays; parties, and such"   OB History   Grav Para Term Preterm Abortions TAB SAB Ect Mult Living                 Review of Systems  Constitutional: Negative for fever, diaphoresis, appetite change, fatigue and unexpected weight change.  HENT: Negative for rhinorrhea, mouth sores, neck stiffness and sinus pressure.   Eyes: Negative for visual disturbance.  Respiratory: Positive for cough, chest tightness, shortness of breath and wheezing. Negative for apnea, choking and stridor.   Cardiovascular: Negative for chest pain.  Gastrointestinal: Negative for nausea, vomiting, abdominal pain, diarrhea and constipation.  Endocrine: Negative for polydipsia, polyphagia and polyuria.  Genitourinary: Negative for dysuria, urgency, frequency and hematuria.  Musculoskeletal: Negative for back pain.  Skin: Negative for rash.  Allergic/Immunologic: Negative for immunocompromised state.  Neurological: Negative for syncope, light-headedness and headaches.  Hematological: Does not bruise/bleed easily.  Psychiatric/Behavioral:  Negative for sleep disturbance. The patient is not nervous/anxious.     Allergies  Aspirin; Aspirin; Ibuprofen; Shellfish allergy; Menthol; and Other  Home Medications   Current Outpatient Rx  Name  Route  Sig  Dispense  Refill  . albuterol (PROVENTIL HFA;VENTOLIN HFA) 108 (90 BASE) MCG/ACT inhaler   Inhalation   Inhale 2 puffs into the lungs every 6 (six) hours as needed for wheezing or shortness of breath.         . budesonide-formoterol (SYMBICORT) 160-4.5 MCG/ACT inhaler   Inhalation   Inhale 2 puffs into the lungs 2  (two) times daily.         . diphenhydrAMINE (BENADRYL) 25 mg capsule   Oral   Take 25 mg by mouth daily as needed for allergies.          . ferrous sulfate 325 (65 FE) MG tablet   Oral   Take 1 tablet (325 mg total) by mouth 3 (three) times daily with meals.   90 tablet   1   . fluticasone (FLONASE) 50 MCG/ACT nasal spray   Nasal   Place 2 sprays into the nose daily.   16 g   2   . folic acid (FOLVITE) 400 MCG tablet   Oral   Take 400 mcg by mouth every morning.          Marland Kitchen ipratropium-albuterol (DUONEB) 0.5-2.5 (3) MG/3ML SOLN   Nebulization   Take 3 mLs by nebulization every 4 (four) hours as needed (for wheezing or shortness of breath).          . irbesartan-hydrochlorothiazide (AVALIDE) 300-25 MG per tablet   Oral   Take 1 tablet by mouth every morning.          . Potassium 99 MG TABS   Oral   Take 1 tablet by mouth 4 (four) times daily.          . ranitidine (ZANTAC) 150 MG capsule   Oral   Take 150 mg by mouth daily. For heartburn         . predniSONE (DELTASONE) 20 MG tablet   Oral   Take 2 tablets (40 mg total) by mouth daily.   10 tablet   0    BP 145/98  Pulse 137  Temp(Src) 98.6 F (37 C) (Axillary)  SpO2 97%  LMP 07/09/2013 Physical Exam  Nursing note and vitals reviewed. Constitutional: She is oriented to person, place, and time. She appears well-developed and well-nourished. No distress.  Awake, alert, nontoxic appearance  HENT:  Head: Normocephalic and atraumatic.  Right Ear: External ear normal.  Left Ear: External ear normal.  Nose: Rhinorrhea present. Right sinus exhibits no maxillary sinus tenderness and no frontal sinus tenderness. Left sinus exhibits no maxillary sinus tenderness and no frontal sinus tenderness.  Mouth/Throat: Uvula is midline, oropharynx is clear and moist and mucous membranes are normal. Mucous membranes are not dry. No edematous. No oropharyngeal exudate, posterior oropharyngeal edema, posterior  oropharyngeal erythema or tonsillar abscesses.  Bilateral nasal polyps  Eyes: Conjunctivae and EOM are normal. Pupils are equal, round, and reactive to light. No scleral icterus.  Neck: Normal range of motion. Neck supple.  Cardiovascular: Regular rhythm, normal heart sounds and intact distal pulses.  Tachycardia present.   No murmur heard. Pulses:      Radial pulses are 2+ on the right side, and 2+ on the left side.       Dorsalis pedis pulses are 2+ on the right side, and  2+ on the left side.       Posterior tibial pulses are 2+ on the right side, and 2+ on the left side.  Tachycardic  Pulmonary/Chest: Accessory muscle usage present. Tachypnea noted. No respiratory distress. She has decreased breath sounds (throughout). She has wheezes (throughout). She has no rhonchi. She has no rales. She exhibits no tenderness and no bony tenderness.  Abdominal: Soft. Normal appearance and bowel sounds are normal. She exhibits no mass. There is no tenderness. There is no rigidity, no rebound, no guarding and no CVA tenderness.  obese  Musculoskeletal: Normal range of motion. She exhibits no edema and no tenderness.  Lymphadenopathy:    She has no cervical adenopathy.  Neurological: She is alert and oriented to person, place, and time. She exhibits normal muscle tone. Coordination normal.  Speech is clear and goal oriented Moves extremities without ataxia  Skin: Skin is warm and dry. No rash noted. She is not diaphoretic. No erythema.  Psychiatric: She has a normal mood and affect.    ED Course   Procedures (including critical care time)  Labs Reviewed  CBC - Abnormal; Notable for the following:    Hemoglobin 8.2 (*)    HCT 28.3 (*)    MCV 65.5 (*)    MCH 19.0 (*)    MCHC 29.0 (*)    RDW 19.0 (*)    Platelets 418 (*)    All other components within normal limits  BASIC METABOLIC PANEL - Abnormal; Notable for the following:    Potassium 2.7 (*)    Glucose, Bld 178 (*)    All other  components within normal limits   Dg Chest 2 View  07/11/2013   *RADIOLOGY REPORT*  Clinical Data: Cough, fever and wheezing.  CHEST - 2 VIEW  Comparison: Chest x-ray 05/28/2013.  Findings: Lung volumes are normal.  No consolidative airspace disease.  No pleural effusions.  No pneumothorax.  No pulmonary nodule or mass noted.  Pulmonary vasculature and the cardiomediastinal silhouette are within normal limits.  IMPRESSION: 1. No radiographic evidence of acute cardiopulmonary disease.   Original Report Authenticated By: Trudie Reed, M.D.   1. Asthma with acute exacerbation   2. Hypokalemia   3. Anemia due to chronic illness   4. Tachycardia     MDM  Hubert Azure presents with asthma exacerbation.  Will give continuous nebulizer, solumedrol and reassess.  Discussed possible use of magnesium and admission.  Also pending basic labs and CXR.  Pt SPO2 94% at rest, tachycardic and tachypenic.    12:38 AM Pt with resolution of wheezing and requesting discharge home.  Patient ambulated in ED with O2 saturations maintained >90 (at 97% while ambulating), no current signs of respiratory distress. Lung exam improved after nebulizer treatment. Prednisone given in the ED and pt will bd dc with 5 day burst. Pt states they are breathing at baseline. Pt has been instructed to continue using prescribed medications and to speak with PCP about today's exacerbation. Pt also give [facility].  CBC with anemia with a hgb of 8.2 and BMP with significant hypokalemia of 2.7.  Pt states both of these are baseline for her and she is taking both iron and potassium at home.  She refuses potassium repletion here in the department. I have encouraged close follow-up of these values by the PCP.  I have also discussed reasons to return immediately to the ER.  Patient expresses understanding and agrees with plan.  Dahlia Client Lleyton Byers, PA-C 07/12/13 [phone]

## 2013-07-11 NOTE — ED Notes (Signed)
Pt has had productive cough w/ yellow mucous x 3 days. Hx of severe asthma and nasal polyps which she is waiting to have removed. Duoneb given in route.

## 2013-07-11 NOTE — ED Notes (Signed)
Pt has been on phone ever since she arrived to emergency room even after advised that she should not be talking while taking neb tx.

## 2013-07-12 MED ORDER — PREDNISONE 20 MG PO TABS: 40.0000 mg | ORAL_TABLET | Freq: Every day | ORAL | Status: DC

## 2013-07-14 NOTE — ED Provider Notes (Signed)
Medical screening examination/treatment/procedure(s) were performed by non-physician practitioner and as supervising physician I was immediately available for consultation/collaboration.   Shresta Risden L Cina Klumpp, MD 07/14/13 0136 

## 2013-07-24 ENCOUNTER — Encounter (HOSPITAL_COMMUNITY): Payer: Self-pay | Admitting: Emergency Medicine

## 2013-07-24 ENCOUNTER — Emergency Department (HOSPITAL_COMMUNITY): Payer: Medicare Other

## 2013-07-24 ENCOUNTER — Inpatient Hospital Stay (HOSPITAL_COMMUNITY)
Admission: EM | Admit: 2013-07-24 | Discharge: 2013-07-24 | DRG: 202 | Disposition: A | Discharge status: Home / Self Care | Payer: Medicare Other | Attending: Internal Medicine | Admitting: Internal Medicine

## 2013-07-24 DIAGNOSIS — G4730 Sleep apnea, unspecified: Secondary | ICD-10-CM | POA: Diagnosis present

## 2013-07-24 DIAGNOSIS — K047 Periapical abscess without sinus: Secondary | ICD-10-CM

## 2013-07-24 DIAGNOSIS — D509 Iron deficiency anemia, unspecified: Secondary | ICD-10-CM | POA: Diagnosis present

## 2013-07-24 DIAGNOSIS — J45902 Unspecified asthma with status asthmaticus: Secondary | ICD-10-CM

## 2013-07-24 DIAGNOSIS — J9601 Acute respiratory failure with hypoxia: Secondary | ICD-10-CM

## 2013-07-24 DIAGNOSIS — J45909 Unspecified asthma, uncomplicated: Secondary | ICD-10-CM

## 2013-07-24 DIAGNOSIS — E119 Type 2 diabetes mellitus without complications: Secondary | ICD-10-CM | POA: Diagnosis present

## 2013-07-24 DIAGNOSIS — R739 Hyperglycemia, unspecified: Secondary | ICD-10-CM

## 2013-07-24 DIAGNOSIS — F4310 Post-traumatic stress disorder, unspecified: Secondary | ICD-10-CM | POA: Diagnosis present

## 2013-07-24 DIAGNOSIS — F329 Major depressive disorder, single episode, unspecified: Secondary | ICD-10-CM | POA: Diagnosis present

## 2013-07-24 DIAGNOSIS — E876 Hypokalemia: Secondary | ICD-10-CM | POA: Diagnosis present

## 2013-07-24 DIAGNOSIS — J329 Chronic sinusitis, unspecified: Secondary | ICD-10-CM

## 2013-07-24 DIAGNOSIS — I10 Essential (primary) hypertension: Secondary | ICD-10-CM | POA: Diagnosis present

## 2013-07-24 DIAGNOSIS — G4733 Obstructive sleep apnea (adult) (pediatric): Secondary | ICD-10-CM

## 2013-07-24 DIAGNOSIS — J40 Bronchitis, not specified as acute or chronic: Secondary | ICD-10-CM

## 2013-07-24 DIAGNOSIS — R0902 Hypoxemia: Secondary | ICD-10-CM | POA: Diagnosis present

## 2013-07-24 DIAGNOSIS — K219 Gastro-esophageal reflux disease without esophagitis: Secondary | ICD-10-CM

## 2013-07-24 DIAGNOSIS — Z79899 Other long term (current) drug therapy: Secondary | ICD-10-CM

## 2013-07-24 DIAGNOSIS — D649 Anemia, unspecified: Secondary | ICD-10-CM

## 2013-07-24 DIAGNOSIS — F3289 Other specified depressive episodes: Secondary | ICD-10-CM | POA: Diagnosis present

## 2013-07-24 DIAGNOSIS — J45901 Unspecified asthma with (acute) exacerbation: Principal | ICD-10-CM | POA: Diagnosis present

## 2013-07-24 DIAGNOSIS — F411 Generalized anxiety disorder: Secondary | ICD-10-CM | POA: Diagnosis present

## 2013-07-24 DIAGNOSIS — Z6841 Body Mass Index (BMI) 40.0 and over, adult: Secondary | ICD-10-CM

## 2013-07-24 DIAGNOSIS — J42 Unspecified chronic bronchitis: Secondary | ICD-10-CM | POA: Diagnosis present

## 2013-07-24 DIAGNOSIS — D638 Anemia in other chronic diseases classified elsewhere: Secondary | ICD-10-CM

## 2013-07-24 DIAGNOSIS — R000 Tachycardia, unspecified: Secondary | ICD-10-CM

## 2013-07-24 LAB — CBC WITH DIFFERENTIAL/PLATELET
Basophils Absolute: 0 10*3/uL (ref 0.0–0.1)
Basophils Absolute: 0 10*3/uL (ref 0.0–0.1)
Basophils Relative: 0 % (ref 0–1)
Lymphocytes Relative: 21 % (ref 12–46)
Lymphs Abs: 0.6 10*3/uL — ABNORMAL LOW (ref 0.7–4.0)
MCH: 18.4 pg — ABNORMAL LOW (ref 26.0–34.0)
MCHC: 28.7 g/dL — ABNORMAL LOW (ref 30.0–36.0)
MCHC: 28.3 g/dL — ABNORMAL LOW (ref 30.0–36.0)
MCV: 65 fL — ABNORMAL LOW (ref 78.0–100.0)
Monocytes Absolute: 0.1 10*3/uL (ref 0.1–1.0)
Neutro Abs: 4.9 10*3/uL (ref 1.7–7.7)
Neutrophils Relative %: 61 % (ref 43–77)
Platelets: 251 10*3/uL (ref 150–400)
RDW: 19.5 % — ABNORMAL HIGH (ref 11.5–15.5)
RDW: 19.4 % — ABNORMAL HIGH (ref 11.5–15.5)
WBC: 7.9 10*3/uL (ref 4.0–10.5)

## 2013-07-24 LAB — COMPREHENSIVE METABOLIC PANEL
ALT: 13 U/L (ref 0–35)
Alkaline Phosphatase: 81 U/L (ref 39–117)
BUN: 6 mg/dL (ref 6–23)
Calcium: 8.7 mg/dL (ref 8.4–10.5)
Creatinine, Ser: 0.69 mg/dL (ref 0.50–1.10)
GFR calc Af Amer: >90 mL/min (ref >90)
Total Bilirubin: 0.3 mg/dL (ref 0.3–1.2)
Total Protein: 6.7 g/dL (ref 6.0–8.3)

## 2013-07-24 LAB — BASIC METABOLIC PANEL
CO2: 23 mEq/L (ref 19–32)
Chloride: 102 mEq/L (ref 96–112)
GFR calc Af Amer: >90 mL/min (ref >90)
Potassium: 2.9 mEq/L — ABNORMAL LOW (ref 3.5–5.1)
Sodium: 136 mEq/L (ref 135–145)

## 2013-07-24 LAB — MRSA PCR SCREENING: MRSA by PCR: POSITIVE — AB

## 2013-07-24 LAB — GLUCOSE, CAPILLARY: Glucose-Capillary: 297 mg/dL — ABNORMAL HIGH (ref 70–99)

## 2013-07-24 MED ORDER — IPRATROPIUM BROMIDE 0.02 % IN SOLN
0.5000 mg | Freq: Once | RESPIRATORY_TRACT | Status: AC
  Administered 2013-07-24: 0.5 mg via RESPIRATORY_TRACT
  Filled 2013-07-24: qty 2.5

## 2013-07-24 MED ORDER — MUPIROCIN 2 % EX OINT
1.0000 "application " | TOPICAL_OINTMENT | Freq: Two times a day (BID) | CUTANEOUS | Status: DC
  Administered 2013-07-24: 1 via NASAL
  Filled 2013-07-24: qty 22

## 2013-07-24 MED ORDER — CHLORHEXIDINE GLUCONATE CLOTH 2 % EX PADS
6.0000 | MEDICATED_PAD | Freq: Every day | CUTANEOUS | Status: DC
  Administered 2013-07-24: 6 via TOPICAL

## 2013-07-24 MED ORDER — CLINDAMYCIN HCL 300 MG PO CAPS
300.0000 mg | ORAL_CAPSULE | Freq: Once | ORAL | Status: AC
  Administered 2013-07-24: 300 mg via ORAL
  Filled 2013-07-24: qty 1

## 2013-07-24 MED ORDER — POTASSIUM CHLORIDE CRYS ER 20 MEQ PO TBCR
40.0000 meq | EXTENDED_RELEASE_TABLET | Freq: Three times a day (TID) | ORAL | Status: DC
  Filled 2013-07-24 (×2): qty 2

## 2013-07-24 MED ORDER — SODIUM CHLORIDE 0.9 % IV SOLN: INTRAVENOUS | Status: DC

## 2013-07-24 MED ORDER — ACETAMINOPHEN 650 MG RE SUPP: 650.0000 mg | Freq: Four times a day (QID) | RECTAL | Status: DC | PRN

## 2013-07-24 MED ORDER — CLINDAMYCIN PHOSPHATE 300 MG/50ML IV SOLN
300.0000 mg | Freq: Four times a day (QID) | INTRAVENOUS | Status: DC
  Administered 2013-07-24: 300 mg via INTRAVENOUS
  Filled 2013-07-24 (×2): qty 50

## 2013-07-24 MED ORDER — ALBUTEROL SULFATE (5 MG/ML) 0.5% IN NEBU
2.5000 mg | INHALATION_SOLUTION | RESPIRATORY_TRACT | Status: DC
  Administered 2013-07-24: 2.5 mg via RESPIRATORY_TRACT
  Filled 2013-07-24: qty 0.5

## 2013-07-24 MED ORDER — CLINDAMYCIN HCL 300 MG PO CAPS: 300.0000 mg | ORAL_CAPSULE | Freq: Three times a day (TID) | ORAL | Status: DC

## 2013-07-24 MED ORDER — HEPARIN SODIUM (PORCINE) 5000 UNIT/ML IJ SOLN
5000.0000 [IU] | Freq: Three times a day (TID) | INTRAMUSCULAR | Status: DC
  Administered 2013-07-24: 5000 [IU] via SUBCUTANEOUS
  Filled 2013-07-24 (×4): qty 1

## 2013-07-24 MED ORDER — IPRATROPIUM BROMIDE 0.02 % IN SOLN
0.5000 mg | Freq: Once | RESPIRATORY_TRACT | Status: AC
  Administered 2013-07-24: 0.5 mg via RESPIRATORY_TRACT
  Filled 2013-07-24 (×2): qty 2.5

## 2013-07-24 MED ORDER — GUAIFENESIN ER 600 MG PO TB12
600.0000 mg | ORAL_TABLET | Freq: Two times a day (BID) | ORAL | Status: DC
  Administered 2013-07-24: 600 mg via ORAL
  Filled 2013-07-24 (×2): qty 1

## 2013-07-24 MED ORDER — POTASSIUM CHLORIDE CRYS ER 20 MEQ PO TBCR
30.0000 meq | EXTENDED_RELEASE_TABLET | Freq: Two times a day (BID) | ORAL | Status: DC
  Administered 2013-07-24: 30 meq via ORAL
  Filled 2013-07-24 (×2): qty 1

## 2013-07-24 MED ORDER — ALBUTEROL SULFATE (5 MG/ML) 0.5% IN NEBU
5.0000 mg | INHALATION_SOLUTION | Freq: Once | RESPIRATORY_TRACT | Status: AC
  Administered 2013-07-24: 5 mg via RESPIRATORY_TRACT
  Filled 2013-07-24: qty 1

## 2013-07-24 MED ORDER — FERROUS SULFATE 325 (65 FE) MG PO TABS
325.0000 mg | ORAL_TABLET | Freq: Three times a day (TID) | ORAL | Status: DC
  Administered 2013-07-24: 325 mg via ORAL
  Filled 2013-07-24 (×4): qty 1

## 2013-07-24 MED ORDER — FOLIC ACID 1 MG PO TABS
1000.0000 ug | ORAL_TABLET | Freq: Every morning | ORAL | Status: DC
  Administered 2013-07-24: 1 mg via ORAL
  Filled 2013-07-24: qty 1

## 2013-07-24 MED ORDER — METHYLPREDNISOLONE SODIUM SUCC 125 MG IJ SOLR
60.0000 mg | Freq: Four times a day (QID) | INTRAMUSCULAR | Status: DC
  Administered 2013-07-24: 60 mg via INTRAVENOUS
  Filled 2013-07-24 (×5): qty 0.96

## 2013-07-24 MED ORDER — ACETAMINOPHEN 325 MG PO TABS
650.0000 mg | ORAL_TABLET | Freq: Four times a day (QID) | ORAL | Status: DC | PRN
  Administered 2013-07-24: 650 mg via ORAL
  Filled 2013-07-24: qty 2

## 2013-07-24 MED ORDER — IPRATROPIUM BROMIDE 0.02 % IN SOLN
0.5000 mg | RESPIRATORY_TRACT | Status: DC
  Administered 2013-07-24: 0.5 mg via RESPIRATORY_TRACT
  Filled 2013-07-24: qty 2.5

## 2013-07-24 MED ORDER — IRBESARTAN 300 MG PO TABS
300.0000 mg | ORAL_TABLET | Freq: Every day | ORAL | Status: DC
  Administered 2013-07-24: 300 mg via ORAL
  Filled 2013-07-24: qty 1

## 2013-07-24 MED ORDER — HYDROCHLOROTHIAZIDE 25 MG PO TABS
25.0000 mg | ORAL_TABLET | Freq: Every day | ORAL | Status: DC
  Administered 2013-07-24: 25 mg via ORAL
  Filled 2013-07-24: qty 1

## 2013-07-24 MED ORDER — INSULIN ASPART 100 UNIT/ML ~~LOC~~ SOLN
0.0000 [IU] | Freq: Three times a day (TID) | SUBCUTANEOUS | Status: DC
  Administered 2013-07-24: 5 [IU] via SUBCUTANEOUS

## 2013-07-24 MED ORDER — ALBUTEROL SULFATE (5 MG/ML) 0.5% IN NEBU
5.0000 mg | INHALATION_SOLUTION | Freq: Once | RESPIRATORY_TRACT | Status: AC
  Administered 2013-07-24: 5 mg via RESPIRATORY_TRACT
  Filled 2013-07-24: qty 1
  Filled 2013-07-24 (×2): qty 0.5

## 2013-07-24 MED ORDER — FAMOTIDINE 20 MG PO TABS
20.0000 mg | ORAL_TABLET | Freq: Two times a day (BID) | ORAL | Status: DC
  Administered 2013-07-24: 20 mg via ORAL
  Filled 2013-07-24 (×2): qty 1

## 2013-07-24 MED ORDER — PREDNISONE 10 MG PO TABS: ORAL_TABLET | ORAL | Status: DC

## 2013-07-24 MED ORDER — IRBESARTAN-HYDROCHLOROTHIAZIDE 300-25 MG PO TABS: 1.0000 | ORAL_TABLET | Freq: Every morning | ORAL | Status: DC

## 2013-07-24 MED ORDER — POTASSIUM CHLORIDE 10 MEQ/100ML IV SOLN
10.0000 meq | Freq: Once | INTRAVENOUS | Status: AC
  Administered 2013-07-24: 10 meq via INTRAVENOUS
  Filled 2013-07-24: qty 100

## 2013-07-24 MED ORDER — INSULIN ASPART 100 UNIT/ML ~~LOC~~ SOLN: 0.0000 [IU] | Freq: Every day | SUBCUTANEOUS | Status: DC

## 2013-07-24 MED ORDER — MORPHINE SULFATE 4 MG/ML IJ SOLN
4.0000 mg | Freq: Once | INTRAMUSCULAR | Status: AC
  Administered 2013-07-24: 4 mg via INTRAVENOUS
  Filled 2013-07-24: qty 1

## 2013-07-24 MED ORDER — BUDESONIDE-FORMOTEROL FUMARATE 160-4.5 MCG/ACT IN AERO
2.0000 | INHALATION_SPRAY | Freq: Two times a day (BID) | RESPIRATORY_TRACT | Status: DC
  Administered 2013-07-24: 2 via RESPIRATORY_TRACT
  Filled 2013-07-24: qty 6

## 2013-07-24 MED ORDER — ALBUTEROL SULFATE (5 MG/ML) 0.5% IN NEBU: 2.5000 mg | INHALATION_SOLUTION | RESPIRATORY_TRACT | Status: DC | PRN

## 2013-07-24 MED ORDER — METHYLPREDNISOLONE SODIUM SUCC 125 MG IJ SOLR
125.0000 mg | Freq: Once | INTRAMUSCULAR | Status: AC
  Administered 2013-07-24: 125 mg via INTRAVENOUS
  Filled 2013-07-24: qty 2

## 2013-07-24 MED ORDER — SODIUM CHLORIDE 0.9 % IJ SOLN: 3.0000 mL | Freq: Two times a day (BID) | INTRAMUSCULAR | Status: DC

## 2013-07-24 MED ORDER — FLUTICASONE PROPIONATE 50 MCG/ACT NA SUSP
2.0000 | Freq: Every day | NASAL | Status: DC
  Administered 2013-07-24: 2 via NASAL
  Filled 2013-07-24: qty 16

## 2013-07-24 NOTE — Discharge Summary (Signed)
Physician Discharge Summary  [name] [name] QMV:[id number] DOB: [birth year] DOA: 07/24/2013  PCP: No Pcp  Admit date: 07/24/2013 Discharge date: 07/24/2013  Time spent: 40 minutes  Recommendations for Outpatient Follow-up:  1. Follow up with PCP  Discharge Diagnoses:  Principal Problem:   Acute asthma exacerbation Active Problems:   HTN (hypertension), malignant   Morbid obesity   GERD (gastroesophageal reflux disease)   Iron deficiency anemia   Hypokalemia   Discharge Condition: stable  Diet recommendation: heart healthy, low sodium diet  Filed Weights   07/24/13 0445  Weight: 133.358 kg (294 lb)    History of present illness:  37 y.o. female with Past medical history of asthma, hypertension, morbid obesity, sleep apnea.  She presented today with the complaint of progressively worsening shortness of breath over last 1 week associated with cough with thick yellowish sputum and chills without any fever. She also noted right-sided teeth pain that started since last 3 days.  She has used her nebulizer today to 4 times with minimal relief and therefore decided to come to the ER. She denies any sick contact, isn't smoking, or aspiration.   Hospital Course:  *Acute asthma exacerbation  - IV solumedrol, antibiotics and inhalers, started empirically on admission. - Her sats remain > 90 % with ambulation. - cont prednisone tapared.   Hypokalemia:  - resolved.   Iron deficiency anemia:  - Ferrous sulfate TID.   HTN (hypertension), malignant  - stable. Monitor.   Dental abscess:  - started on clindamycin 8.25.2014 cont a home for 7 days.   Procedures:  none  Consultations:  none  Discharge Exam: Filed Vitals:   07/24/13 0445  BP: 146/92  Pulse: 124  Temp: 98.5 F (36.9 C)  Resp: 20    General: A&O x3 Cardiovascular: RRR Respiratory: good air movement CAT B/L  Discharge Instructions  Discharge Orders   Future Orders Complete By Expires   Diet - low  sodium heart healthy  As directed    Increase activity slowly  As directed        Medication List         albuterol 108 (90 BASE) MCG/ACT inhaler  Commonly known as:  PROVENTIL HFA;VENTOLIN HFA  Inhale 2 puffs into the lungs every 6 (six) hours as needed for wheezing or shortness of breath.     budesonide-formoterol 160-4.5 MCG/ACT inhaler  Commonly known as:  SYMBICORT  Inhale 2 puffs into the lungs 2 (two) times daily.     clindamycin 300 MG capsule  Commonly known as:  CLEOCIN  Take 1 capsule (300 mg total) by mouth 3 (three) times daily.     diphenhydrAMINE 25 mg capsule  Commonly known as:  BENADRYL  Take 25 mg by mouth daily as needed for allergies.     ferrous sulfate 325 (65 FE) MG tablet  Take 1 tablet (325 mg total) by mouth 3 (three) times daily with meals.     fluticasone 50 MCG/ACT nasal spray  Commonly known as:  FLONASE  Place 2 sprays into the nose daily.     folic acid 400 MCG tablet  Commonly known as:  FOLVITE  Take 400 mcg by mouth every morning.     ipratropium-albuterol 0.5-2.5 (3) MG/3ML Soln  Commonly known as:  DUONEB  Take 3 mLs by nebulization every 4 (four) hours as needed (for wheezing or shortness of breath).     irbesartan-hydrochlorothiazide 300-25 MG per tablet  Commonly known as:  AVALIDE  Take 1 tablet by mouth  every morning.     Potassium 99 MG Tabs  Take 1 tablet by mouth 4 (four) times daily.     predniSONE 10 MG tablet  Commonly known as:  DELTASONE  Takes 6 tablets for 1 days, then 5 tablets for 1 days, then 4 tablets for 1 days, then 3 tablets for 1 days, then 2 tabs for 1 days, then 1 tab for 1 days, and then stop.     ranitidine 150 MG capsule  Commonly known as:  ZANTAC  Take 150 mg by mouth daily. For heartburn       Allergies  Allergen Reactions  . Aspirin Anaphylaxis  . Aspirin Anaphylaxis  . Ibuprofen Anaphylaxis  . Shellfish Allergy Hives and Swelling    Facial and oral swelling  . Menthol Swelling     Tongue swelling  . Other Other (See Comments)    Plastic thermometer covers cause sloughing off of skin on tongue and inside mouth      The results of significant diagnostics from this hospitalization (including imaging, microbiology, ancillary and laboratory) are listed below for reference.    Significant Diagnostic Studies: Dg Chest 2 View  07/24/2013   *RADIOLOGY REPORT*  Clinical Data: Asthma, shortness of breath  CHEST - 2 VIEW  Comparison: 07/11/2013; 05/27/2013 and 12/20/2012  Findings:  Examination is markedly degraded secondary to patient body habitus.  Grossly unchanged borderline enlarged cardiac silhouette and mediastinal contours given decreased lung volumes.  Apparent veiling opacities overlying the bilateral lungs are favored to represent overlying soft tissues.  No discrete focal airspace opacities.  No definite pleural effusion or pneumothorax.  No definite evidence of edema.  Grossly unchanged bones.  IMPRESSION: Degraded examination without definite acute cardiopulmonary disease.   Original Report Authenticated By: Tacey Ruiz, MD   Dg Chest 2 View  07/11/2013   *RADIOLOGY REPORT*  Clinical Data: Cough, fever and wheezing.  CHEST - 2 VIEW  Comparison: Chest x-ray 05/28/2013.  Findings: Lung volumes are normal.  No consolidative airspace disease.  No pleural effusions.  No pneumothorax.  No pulmonary nodule or mass noted.  Pulmonary vasculature and the cardiomediastinal silhouette are within normal limits.  IMPRESSION: 1. No radiographic evidence of acute cardiopulmonary disease.   Original Report Authenticated By: Trudie Reed, M.D.    Microbiology: Recent Results (from the past 240 hour(s))  MRSA PCR SCREENING     Status: Abnormal   Collection Time    07/24/13  4:45 AM      Result Value Range Status   MRSA by PCR POSITIVE (*) NEGATIVE Final   Comment:            The GeneXpert MRSA Assay (FDA     approved for NASAL specimens     only), is one component of a      comprehensive MRSA colonization     surveillance program. It is not     intended to diagnose MRSA     infection nor to guide or     monitor treatment for     MRSA infections.     RESULT CALLED TO, READ BACK BY AND VERIFIED WITH:     Alvie Heidelberg RN AT [phone] ON 08.26.14 BY SHUEA     Labs: Basic Metabolic Panel:  Recent Labs Lab 07/24/13 0118 07/24/13 0555  NA 136 136  K 2.9* 3.0*  CL 102 101  CO2 23 22  GLUCOSE 178* 213*  BUN 7 6  CREATININE 0.69 0.69  CALCIUM 8.7 8.7  Liver Function Tests:  Recent Labs Lab 07/24/13 0555  AST 13  ALT 13  ALKPHOS 81  BILITOT 0.3  PROT 6.7  ALBUMIN 3.7   No results found for this basename: LIPASE, AMYLASE,  in the last 168 hours No results found for this basename: AMMONIA,  in the last 168 hours CBC:  Recent Labs Lab 07/24/13 0118 07/24/13 0555  WBC 7.9 12.5*  NEUTROABS 4.9 11.8*  HGB 8.2* 8.1*  HCT 28.6* 28.6*  MCV 64.7* 65.0*  PLT 247 251   Cardiac Enzymes: No results found for this basename: CKTOTAL, CKMB, CKMBINDEX, TROPONINI,  in the last 168 hours BNP: BNP (last 3 results)  Recent Labs  05/28/13 1821  PROBNP 56.7   CBG:  Recent Labs Lab 07/24/13 0747  GLUCAP 297*       Signed:  Marinda Elk  [facility] 07/24/2013, 9:18 AM

## 2013-07-24 NOTE — ED Notes (Signed)
Bed: WA20 Expected date: 07/24/13 Expected time: 12:23 AM Means of arrival: Ambulance Comments: Wheezing/asthma

## 2013-07-24 NOTE — H&P (Signed)
[facility] History and Physical  Patient: [name] [name]  WJX:[id number]  DOB: 1976-09-15  DOA: 07/24/2013  Referring physician: Dr. Preston Fleeting PCP: No Pcp   Chief Complaint: Shortness of breath with cough  HPI: [name] [name] is a 37 y.o. female with Past medical history of asthma, hypertension, morbid obesity, sleep apnea. She presented today with the complaint of progressively worsening shortness of breath over last 1 week associated with cough with thick yellowish sputum and chills without any fever. She also noted right-sided teeth pain that started since last 3 days. She has used her nebulizer today to 4 times with minimal relief and therefore decided to come to the ER. She denies any sick contact, isn't smoking, or aspiration.  Review of Systems: as mentioned in the history of present illness.  A Comprehensive review of the other systems is negative.  Past Medical History  Diagnosis Date  . Asthma   . Hypertension   . Chronic anemia   . Abnormal TSH   . Morbid obesity   . GERD (gastroesophageal reflux disease)   . Seasonal allergies   . Nasal polyps   . Diabetes mellitus     type 2  . Chronic bronchitis   . Sleep apnea   . Shortness of breath 12/14/11    "related to how bad my asthma is; sometimes @ rest, lying down, w/exertion"  . Pneumonia   . Blood transfusion   . Inguinal hernia unilateral, non-recurrent     right  . Kidney infection 2008  . Headache(784.0)   . Anxiety   . Depression   . PTSD (post-traumatic stress disorder)     "related to prior hospitalizations; things that happened when I was in hospital"  . Asthma    Past Surgical History  Procedure Laterality Date  . Cervical cerclage  1999; 2002  . Tubal ligation  2002  . Cervical cerclage    . Tubal ligation     Social History:  reports that she has never smoked. She has never used smokeless tobacco. She reports that she does not drink alcohol or use illicit drugs. Patient is coming from  home. independent for most of her  ADL.  Allergies  Allergen Reactions  . Aspirin Anaphylaxis  . Aspirin Anaphylaxis  . Ibuprofen Anaphylaxis  . Shellfish Allergy Hives and Swelling    Facial and oral swelling  . Menthol Swelling    Tongue swelling  . Other Other (See Comments)    Plastic thermometer covers cause sloughing off of skin on tongue and inside mouth    Family History  Problem Relation Age of Onset  . Diabetes Mother   . Heart disease Father     Prior to Admission medications   Medication Sig Start Date End Date Taking? Authorizing Provider  albuterol (PROVENTIL HFA;VENTOLIN HFA) 108 (90 BASE) MCG/ACT inhaler Inhale 2 puffs into the lungs every 6 (six) hours as needed for wheezing or shortness of breath.   Yes Historical Provider, MD  budesonide-formoterol (SYMBICORT) 160-4.5 MCG/ACT inhaler Inhale 2 puffs into the lungs 2 (two) times daily.   Yes Historical Provider, MD  diphenhydrAMINE (BENADRYL) 25 mg capsule Take 25 mg by mouth daily as needed for allergies.    Yes Historical Provider, MD  ferrous sulfate 325 (65 FE) MG tablet Take 1 tablet (325 mg total) by mouth 3 (three) times daily with meals. 03/16/13  Yes Leroy Sea, MD  fluticasone (FLONASE) 50 MCG/ACT nasal spray Place 2 sprays into the nose daily. 05/30/13  Yes  Henderson Cloud, MD  folic acid (FOLVITE) 400 MCG tablet Take 400 mcg by mouth every morning.    Yes Historical Provider, MD  ipratropium-albuterol (DUONEB) 0.5-2.5 (3) MG/3ML SOLN Take 3 mLs by nebulization every 4 (four) hours as needed (for wheezing or shortness of breath).  09/01/12  Yes Heather Zenaida Niece Wingen, PA-C  irbesartan-hydrochlorothiazide (AVALIDE) 300-25 MG per tablet Take 1 tablet by mouth every morning.    Yes Historical Provider, MD  Potassium 99 MG TABS Take 1 tablet by mouth 4 (four) times daily.    Yes Historical Provider, MD  ranitidine (ZANTAC) 150 MG capsule Take 150 mg by mouth daily. For heartburn   Yes Historical  Provider, MD    Physical Exam: Filed Vitals:   07/24/13 0259 07/24/13 0302 07/24/13 0346 07/24/13 0445  BP:   157/98 146/92  Pulse: 120  117 124  Temp:    98.5 F (36.9 C)  TempSrc:    Axillary  Resp: 22   20  Height:    5\' 7"  (1.702 m)  Weight:    133.358 kg (294 lb)  SpO2: 100% 100% 100% 100%    General: Alert, Awake and Oriented to Time, Place and Person. Appear in moderate distress Eyes: PERRL ENT: Oral Mucosa clear dry. Neck: Difficult to assess JVD, no Carotid Bruits  Cardiovascular: S1 and S2 Present, no Murmur, Peripheral Pulses Present Respiratory: Bilateral Air entry equal and Decreased,  Basal Crackles, bilateral diffuse expiratory wheezes Abdomen: Bowel Sound Present, Soft and Non tender Skin: No Rash Extremities: No Pedal edema, no calf tenderness Neurologic: Grossly Unremarkable.  Labs on Admission:  CBC:  Recent Labs Lab 07/24/13 0118 07/24/13 0555  WBC 7.9 12.5*  NEUTROABS 4.9 PENDING  HGB 8.2* 8.1*  HCT 28.6* 28.6*  MCV 64.7* 65.0*  PLT 247 251    CMP     Component Value Date/Time   NA 136 07/24/2013 0118   K 2.9* 07/24/2013 0118   CL 102 07/24/2013 0118   CO2 23 07/24/2013 0118   GLUCOSE 178* 07/24/2013 0118   BUN 7 07/24/2013 0118   CREATININE 0.69 07/24/2013 0118   CALCIUM 8.7 07/24/2013 0118   PROT 7.1 12/14/2011 1629   ALBUMIN 3.6 12/14/2011 1629   AST 15 12/14/2011 1629   ALT 12 12/14/2011 1629   ALKPHOS 91 12/14/2011 1629   BILITOT 0.2* 12/14/2011 1629   GFRNONAA >90 07/24/2013 0118   GFRAA >90 07/24/2013 0118    No results found for this basename: LIPASE, AMYLASE,  in the last 168 hours No results found for this basename: AMMONIA,  in the last 168 hours  Cardiac Enzymes: No results found for this basename: CKTOTAL, CKMB, CKMBINDEX, TROPONINI,  in the last 168 hours  BNP (last 3 results)  Recent Labs  05/28/13 1821  PROBNP 56.7    Radiological Exams on Admission: Dg Chest 2 View  07/24/2013   *RADIOLOGY REPORT*  Clinical Data:  Asthma, shortness of breath  CHEST - 2 VIEW  Comparison: 07/11/2013; 05/27/2013 and 12/20/2012  Findings:  Examination is markedly degraded secondary to patient body habitus.  Grossly unchanged borderline enlarged cardiac silhouette and mediastinal contours given decreased lung volumes.  Apparent veiling opacities overlying the bilateral lungs are favored to represent overlying soft tissues.  No discrete focal airspace opacities.  No definite pleural effusion or pneumothorax.  No definite evidence of edema.  Grossly unchanged bones.  IMPRESSION: Degraded examination without definite acute cardiopulmonary disease.   Original Report Authenticated By: Tacey Ruiz, MD  Assessment/Plan Principal Problem:   Acute asthma exacerbation Active Problems:   HTN (hypertension), malignant   Morbid obesity   GERD (gastroesophageal reflux disease)   Iron deficiency anemia   Hypokalemia   1. Acute asthma exacerbation Patient has history of recurrent asthma exacerbation. She is supposed to go for surgery for nasal polyps but has delayed at present. She has significant wheezing on examination with hypoxia. It appears in respiratory distress. We will treat her with IV Solu-Medrol, nebulizations both scheduled and as needed, IV antibiotic clindamycin and oxygen as needed. We will also monitor her PEFR  2. dental pain with possible abscess. Been treated with IV clindamycin Richard color both lung infection as well as dental abscess. She may need outpatient dental appointment  3. Hypokalemia Appears chronic, continue replacement.  4. Hypertension Continue home antihypertensive.  DVT Prophylaxis: subcutaneous Heparin Nutrition: Diabetic diet  Code Status: Full    Author: Lynden Oxford, MD [facility] Pager: [phone] 07/24/2013, 6:02 AM    If 7PM-7AM, please contact night-coverage [email or web address] Password [email or web address]

## 2013-07-24 NOTE — Progress Notes (Signed)
[facility] PROGRESS NOTE  Assessment/Plan: *Acute asthma exacerbation - IV solumedrol, antibiotics and inhalers. - ambulate and check saturations. - change to orals, home today.  Hypokalemia: - replete, check a mag.   Iron deficiency anemia: - Ferrous sulfate TID.   HTN (hypertension), malignant - stable. Monitor.   Dental abscess: - started on clindamycin 8.25.2014    Code Status: full Family Communication: mone  Disposition Plan: inpatient   Consultants:  none  Procedures:  none  Antibiotics:  Clindamycin 8.26.2014  HPI/Subjective: Feel much better than yesterday  Objective: Filed Vitals:   07/24/13 0302 07/24/13 0346 07/24/13 0445 07/24/13 0803  BP:  157/98 146/92   Pulse:  117 124   Temp:   98.5 F (36.9 C)   TempSrc:   Axillary   Resp:   20   Height:   5\' 7"  (1.702 m)   Weight:   133.358 kg (294 lb)   SpO2: 100% 100% 100% 98%    Intake/Output Summary (Last 24 hours) at 07/24/13 0902 Last data filed at 07/24/13 0600  Gross per 24 hour  Intake  427.5 ml  Output      0 ml  Net  427.5 ml   Filed Weights   07/24/13 0445  Weight: 133.358 kg (294 lb)    Exam:  General: Alert, awake, oriented x3, in no acute distress.  HEENT: No bruits, no goiter.  Heart: Regular rate and rhythm, without murmurs, rubs, gallops.  Lungs: Good air movement, clear to auscultations. Abdomen: Soft, nontender, nondistended, positive bowel sounds.    Data Reviewed: Basic Metabolic Panel:  Recent Labs Lab 07/24/13 0118 07/24/13 0555  NA 136 136  K 2.9* 3.0*  CL 102 101  CO2 23 22  GLUCOSE 178* 213*  BUN 7 6  CREATININE 0.69 0.69  CALCIUM 8.7 8.7   Liver Function Tests:  Recent Labs Lab 07/24/13 0555  AST 13  ALT 13  ALKPHOS 81  BILITOT 0.3  PROT 6.7  ALBUMIN 3.7   No results found for this basename: LIPASE, AMYLASE,  in the last 168 hours No results found for this basename: AMMONIA,  in the last 168 hours CBC:  Recent Labs Lab  07/24/13 0118 07/24/13 0555  WBC 7.9 12.5*  NEUTROABS 4.9 11.8*  HGB 8.2* 8.1*  HCT 28.6* 28.6*  MCV 64.7* 65.0*  PLT 247 251   Cardiac Enzymes: No results found for this basename: CKTOTAL, CKMB, CKMBINDEX, TROPONINI,  in the last 168 hours BNP (last 3 results)  Recent Labs  05/28/13 1821  PROBNP 56.7   CBG:  Recent Labs Lab 07/24/13 0747  GLUCAP 297*    Recent Results (from the past 240 hour(s))  MRSA PCR SCREENING     Status: Abnormal   Collection Time    07/24/13  4:45 AM      Result Value Range Status   MRSA by PCR POSITIVE (*) NEGATIVE Final   Comment:            The GeneXpert MRSA Assay (FDA     approved for NASAL specimens     only), is one component of a     comprehensive MRSA colonization     surveillance program. It is not     intended to diagnose MRSA     infection nor to guide or     monitor treatment for     MRSA infections.     RESULT CALLED TO, READ BACK BY AND VERIFIED WITH:     Alvie Heidelberg  RN AT 0645 ON 08.26.14 BY SHUEA     Studies: Dg Chest 2 View  07/24/2013   *RADIOLOGY REPORT*  Clinical Data: Asthma, shortness of breath  CHEST - 2 VIEW  Comparison: 07/11/2013; 05/27/2013 and 12/20/2012  Findings:  Examination is markedly degraded secondary to patient body habitus.  Grossly unchanged borderline enlarged cardiac silhouette and mediastinal contours given decreased lung volumes.  Apparent veiling opacities overlying the bilateral lungs are favored to represent overlying soft tissues.  No discrete focal airspace opacities.  No definite pleural effusion or pneumothorax.  No definite evidence of edema.  Grossly unchanged bones.  IMPRESSION: Degraded examination without definite acute cardiopulmonary disease.   Original Report Authenticated By: Tacey Ruiz, MD    Scheduled Meds: . albuterol  2.5 mg Nebulization Q4H  . budesonide-formoterol  2 puff Inhalation BID  . Chlorhexidine Gluconate Cloth  6 each Topical Q0600  . clindamycin (CLEOCIN) IV  300  mg Intravenous Q6H  . famotidine  20 mg Oral BID  . ferrous sulfate  325 mg Oral TID WC  . fluticasone  2 spray Each Nare Daily  . folic acid  1,000 mcg Oral q morning - 10a  . guaiFENesin  600 mg Oral BID  . heparin  5,000 Units Subcutaneous Q8H  . hydrochlorothiazide  25 mg Oral Daily  . insulin aspart  0-5 Units Subcutaneous QHS  . insulin aspart  0-9 Units Subcutaneous TID WC  . ipratropium  0.5 mg Nebulization Q4H  . irbesartan  300 mg Oral Daily  . methylPREDNISolone (SOLU-MEDROL) injection  60 mg Intravenous Q6H  . mupirocin ointment  1 application Nasal BID  . potassium chloride  30 mEq Oral BID  . sodium chloride  3 mL Intravenous Q12H   Continuous Infusions: . sodium chloride 50 mL/hr (07/24/13 0515)     Marinda Elk  [facility] Pager [phone]. If 8PM-8AM, please contact night-coverage at [email or web address], password [facility] 07/24/2013, 9:02 AM  LOS: 0 days

## 2013-07-24 NOTE — ED Notes (Signed)
Per EMS, pt reports ShOB, hx asthma, pt noted to be wheezing.  Pt given 4 Albuterol and 1 Atrovent neb treatments.  Pt refused IV en route, stated the could only place BP cuff on certain areas of her arm, pt refusing to place temp probe in her mouth because she "is allergic."

## 2013-07-24 NOTE — ED Provider Notes (Signed)
CSN: [id number]     Arrival date & time 07/24/13  0034 History   First MD Initiated Contact with Patient 07/24/13 0109     Chief Complaint  Patient presents with  . Shortness of Breath   (Consider location/radiation/quality/duration/timing/severity/associated sxs/prior Treatment) Patient is a 37 y.o. female presenting with shortness of breath. The history is provided by the patient.  Shortness of Breath She has a history of asthma and nasal polyps. She states that her asthma has been giving her intermittent problems for the last several years but got significantly worse yesterday. She has used her albuterol nebulizer at home 4 times with and gets very brief relief of dyspnea and wheezing but within 30 minutes starts to feel bad again. She has a daily cough from but over the last several days the cough has been productive of thick yellowish sputum. She has had chills but no fever or sweats. She came in by ambulance and received an albuterol nebulizer treatment in the ambulance and also one in the ED prior to my seeing her. She is a nonsmoker. She is also complaining of pain in a right lower tooth for the last 3 days and has noted some swelling in the right side of her jaw. Pain is moderate to severe and she rated at 6/10. It is worse with pressure exposure to cold air.  Past Medical History  Diagnosis Date  . Asthma   . Hypertension   . Chronic anemia   . Abnormal TSH   . Morbid obesity   . GERD (gastroesophageal reflux disease)   . Seasonal allergies   . Nasal polyps   . Diabetes mellitus     type 2  . Chronic bronchitis   . Sleep apnea   . Shortness of breath 12/14/11    "related to how bad my asthma is; sometimes @ rest, lying down, w/exertion"  . Pneumonia   . Blood transfusion   . Inguinal hernia unilateral, non-recurrent     right  . Kidney infection 2008  . Headache(784.0)   . Anxiety   . Depression   . PTSD (post-traumatic stress disorder)     "related to prior  hospitalizations; things that happened when I was in hospital"  . Asthma    Past Surgical History  Procedure Laterality Date  . Cervical cerclage  1999; 2002  . Tubal ligation  2002  . Cervical cerclage    . Tubal ligation     Family History  Problem Relation Age of Onset  . Diabetes Mother   . Heart disease Father    History  Substance Use Topics  . Smoking status: Never Smoker   . Smokeless tobacco: Never Used  . Alcohol Use: No     Comment: 12/14/11 "only on birthdays; parties, and such"   OB History   Grav Para Term Preterm Abortions TAB SAB Ect Mult Living                 Review of Systems  Respiratory: Positive for shortness of breath.   All other systems reviewed and are negative.    Allergies  Aspirin; Aspirin; Ibuprofen; Shellfish allergy; Menthol; and Other  Home Medications   Current Outpatient Rx  Name  Route  Sig  Dispense  Refill  . albuterol (PROVENTIL HFA;VENTOLIN HFA) 108 (90 BASE) MCG/ACT inhaler   Inhalation   Inhale 2 puffs into the lungs every 6 (six) hours as needed for wheezing or shortness of breath.         Marland Kitchen  budesonide-formoterol (SYMBICORT) 160-4.5 MCG/ACT inhaler   Inhalation   Inhale 2 puffs into the lungs 2 (two) times daily.         . diphenhydrAMINE (BENADRYL) 25 mg capsule   Oral   Take 25 mg by mouth daily as needed for allergies.          . ferrous sulfate 325 (65 FE) MG tablet   Oral   Take 1 tablet (325 mg total) by mouth 3 (three) times daily with meals.   90 tablet   1   . fluticasone (FLONASE) 50 MCG/ACT nasal spray   Nasal   Place 2 sprays into the nose daily.   16 g   2   . folic acid (FOLVITE) 400 MCG tablet   Oral   Take 400 mcg by mouth every morning.          Marland Kitchen ipratropium-albuterol (DUONEB) 0.5-2.5 (3) MG/3ML SOLN   Nebulization   Take 3 mLs by nebulization every 4 (four) hours as needed (for wheezing or shortness of breath).          . irbesartan-hydrochlorothiazide (AVALIDE) 300-25 MG per  tablet   Oral   Take 1 tablet by mouth every morning.          . Potassium 99 MG TABS   Oral   Take 1 tablet by mouth 4 (four) times daily.          . ranitidine (ZANTAC) 150 MG capsule   Oral   Take 150 mg by mouth daily. For heartburn          BP 137/27  Pulse 127  Temp(Src) 98.5 F (36.9 C) (Oral)  Resp 24  SpO2 100%  LMP 07/09/2013 Physical Exam  Nursing note and vitals reviewed.  37 year old female, resting comfortably and in no acute distress. Vital signs are significant for tachycardia with heart rate 127, and tachypnea with respiratory rate of 24. Oxygen saturation is 100%, which is normal. Head is normocephalic and atraumatic. PERRLA, EOMI. Oropharynx is clear. Tooth #30 is markedly carious and wanted just about down to the gingival line. There is swelling of the gingiva and tenderness to palpation. There is some slight soft tissue swelling overlying this tooth. Neck is nontender and supple without adenopathy or JVD. Back is nontender and there is no CVA tenderness. Lungs have diffuse wheezes without rales or rhonchi. There is slight use of accessory muscles of respiration. Chest is nontender. Heart has regular rate and rhythm without murmur. Abdomen is soft, flat, nontender without masses or hepatosplenomegaly and peristalsis is normoactive. Extremities have no cyanosis or edema, full range of motion is present. Skin is warm and dry without rash. Neurologic: Mental status is normal, cranial nerves are intact, there are no motor or sensory deficits.  ED Course  Procedures (including critical care time) Results for orders placed during the hospital encounter of 07/24/13  CBC WITH DIFFERENTIAL      Result Value Range   WBC 7.9  4.0 - 10.5 K/uL   RBC 4.42  3.87 - 5.11 MIL/uL   Hemoglobin 8.2 (*) 12.0 - 15.0 g/dL   HCT 45.4 (*) 09.8 - 11.9 %   MCV 64.7 (*) 78.0 - 100.0 fL   MCH 18.6 (*) 26.0 - 34.0 pg   MCHC 28.7 (*) 30.0 - 36.0 g/dL   RDW 14.7 (*) 82.9 -  15.5 %   Platelets 247  150 - 400 K/uL   Neutrophils Relative % 61  43 - 77 %  Neutro Abs 4.9  1.7 - 7.7 K/uL   Lymphocytes Relative 21  12 - 46 %   Lymphs Abs 1.7  0.7 - 4.0 K/uL   Monocytes Relative 9  3 - 12 %   Monocytes Absolute 0.7  0.1 - 1.0 K/uL   Eosinophils Relative 8 (*) 0 - 5 %   Eosinophils Absolute 0.6  0.0 - 0.7 K/uL   Basophils Relative 0  0 - 1 %   Basophils Absolute 0.0  0.0 - 0.1 K/uL  BASIC METABOLIC PANEL      Result Value Range   Sodium 136  135 - 145 mEq/L   Potassium 2.9 (*) 3.5 - 5.1 mEq/L   Chloride 102  96 - 112 mEq/L   CO2 23  19 - 32 mEq/L   Glucose, Bld 178 (*) 70 - 99 mg/dL   BUN 7  6 - 23 mg/dL   Creatinine, Ser 1.61  0.50 - 1.10 mg/dL   Calcium 8.7  8.4 - 09.6 mg/dL   GFR calc non Af Amer >90  >90 mL/min   GFR calc Af Amer >90  >90 mL/min   Dg Chest 2 View  07/24/2013   *RADIOLOGY REPORT*  Clinical Data: Asthma, shortness of breath  CHEST - 2 VIEW  Comparison: 07/11/2013; 05/27/2013 and 12/20/2012  Findings:  Examination is markedly degraded secondary to patient body habitus.  Grossly unchanged borderline enlarged cardiac silhouette and mediastinal contours given decreased lung volumes.  Apparent veiling opacities overlying the bilateral lungs are favored to represent overlying soft tissues.  No discrete focal airspace opacities.  No definite pleural effusion or pneumothorax.  No definite evidence of edema.  Grossly unchanged bones.  IMPRESSION: Degraded examination without definite acute cardiopulmonary disease.   Original Report Authenticated By: Tacey Ruiz, MD    MDM   1. Asthma exacerbation   2. Dental abscess   3. Hypokalemia   4. Hyperglycemia    Exacerbation of asthma. Dental caries with abscess. She's given a dose of clindamycin and is given methylprednisolone intravenously as well as additional albuterol with Atrovent treatments.  Wheezing improved but was still present she was still somewhat cachectic after her third treatment.  Potassium is come back low and she's given a dose of intravenous potassium. Blood sugar is noted to be moderately elevated at 178 so this will need to be monitored given the fact that she has received a dose of methylprednisolone. Case is discussed with Dr. Allena Katz of [facility] who agrees to admit the patient.    Dione Booze, MD 07/24/13 [phone]

## 2013-08-24 ENCOUNTER — Emergency Department (HOSPITAL_COMMUNITY): Payer: Medicare Other

## 2013-08-24 ENCOUNTER — Inpatient Hospital Stay (HOSPITAL_COMMUNITY): Payer: Medicare Other

## 2013-08-24 ENCOUNTER — Encounter (HOSPITAL_COMMUNITY): Payer: Self-pay | Admitting: Emergency Medicine

## 2013-08-24 ENCOUNTER — Inpatient Hospital Stay (HOSPITAL_COMMUNITY)
Admission: EM | Admit: 2013-08-24 | Discharge: 2013-08-26 | DRG: 760 | Disposition: A | Discharge status: Home / Self Care | Payer: Medicare Other | Attending: Internal Medicine | Admitting: Internal Medicine

## 2013-08-24 DIAGNOSIS — K5900 Constipation, unspecified: Secondary | ICD-10-CM | POA: Diagnosis present

## 2013-08-24 DIAGNOSIS — F329 Major depressive disorder, single episode, unspecified: Secondary | ICD-10-CM | POA: Diagnosis present

## 2013-08-24 DIAGNOSIS — E119 Type 2 diabetes mellitus without complications: Secondary | ICD-10-CM | POA: Diagnosis present

## 2013-08-24 DIAGNOSIS — R739 Hyperglycemia, unspecified: Secondary | ICD-10-CM

## 2013-08-24 DIAGNOSIS — K219 Gastro-esophageal reflux disease without esophagitis: Secondary | ICD-10-CM | POA: Diagnosis present

## 2013-08-24 DIAGNOSIS — D638 Anemia in other chronic diseases classified elsewhere: Secondary | ICD-10-CM

## 2013-08-24 DIAGNOSIS — Z23 Encounter for immunization: Secondary | ICD-10-CM

## 2013-08-24 DIAGNOSIS — F3289 Other specified depressive episodes: Secondary | ICD-10-CM | POA: Diagnosis present

## 2013-08-24 DIAGNOSIS — J45901 Unspecified asthma with (acute) exacerbation: Secondary | ICD-10-CM | POA: Diagnosis present

## 2013-08-24 DIAGNOSIS — R52 Pain, unspecified: Secondary | ICD-10-CM

## 2013-08-24 DIAGNOSIS — J45909 Unspecified asthma, uncomplicated: Secondary | ICD-10-CM | POA: Diagnosis present

## 2013-08-24 DIAGNOSIS — E876 Hypokalemia: Secondary | ICD-10-CM | POA: Diagnosis present

## 2013-08-24 DIAGNOSIS — F411 Generalized anxiety disorder: Secondary | ICD-10-CM | POA: Diagnosis present

## 2013-08-24 DIAGNOSIS — D649 Anemia, unspecified: Secondary | ICD-10-CM | POA: Diagnosis present

## 2013-08-24 DIAGNOSIS — J339 Nasal polyp, unspecified: Secondary | ICD-10-CM | POA: Diagnosis present

## 2013-08-24 DIAGNOSIS — N83209 Unspecified ovarian cyst, unspecified side: Principal | ICD-10-CM | POA: Diagnosis present

## 2013-08-24 DIAGNOSIS — Z6841 Body Mass Index (BMI) 40.0 and over, adult: Secondary | ICD-10-CM

## 2013-08-24 DIAGNOSIS — D259 Leiomyoma of uterus, unspecified: Secondary | ICD-10-CM | POA: Diagnosis present

## 2013-08-24 DIAGNOSIS — T502X5A Adverse effect of carbonic-anhydrase inhibitors, benzothiadiazides and other diuretics, initial encounter: Secondary | ICD-10-CM | POA: Diagnosis present

## 2013-08-24 DIAGNOSIS — D509 Iron deficiency anemia, unspecified: Secondary | ICD-10-CM | POA: Diagnosis present

## 2013-08-24 DIAGNOSIS — R109 Unspecified abdominal pain: Secondary | ICD-10-CM | POA: Diagnosis present

## 2013-08-24 DIAGNOSIS — Z79899 Other long term (current) drug therapy: Secondary | ICD-10-CM

## 2013-08-24 DIAGNOSIS — J42 Unspecified chronic bronchitis: Secondary | ICD-10-CM | POA: Diagnosis present

## 2013-08-24 DIAGNOSIS — N390 Urinary tract infection, site not specified: Secondary | ICD-10-CM | POA: Diagnosis present

## 2013-08-24 DIAGNOSIS — I10 Essential (primary) hypertension: Secondary | ICD-10-CM | POA: Diagnosis present

## 2013-08-24 DIAGNOSIS — F4310 Post-traumatic stress disorder, unspecified: Secondary | ICD-10-CM | POA: Diagnosis present

## 2013-08-24 DIAGNOSIS — G4730 Sleep apnea, unspecified: Secondary | ICD-10-CM | POA: Diagnosis present

## 2013-08-24 LAB — CBC WITH DIFFERENTIAL/PLATELET
Eosinophils Relative: 3 % (ref 0–5)
HCT: 29.7 % — ABNORMAL LOW (ref 36.0–46.0)
Hemoglobin: 8.4 g/dL — ABNORMAL LOW (ref 12.0–15.0)
Lymphs Abs: 1.8 10*3/uL (ref 0.7–4.0)
MCV: 64.4 fL — ABNORMAL LOW (ref 78.0–100.0)
Monocytes Relative: 7 % (ref 3–12)
Neutro Abs: 8.2 10*3/uL — ABNORMAL HIGH (ref 1.7–7.7)
RBC: 4.61 MIL/uL (ref 3.87–5.11)
WBC: 11.1 10*3/uL — ABNORMAL HIGH (ref 4.0–10.5)

## 2013-08-24 LAB — PREGNANCY, URINE: Preg Test, Ur: NEGATIVE

## 2013-08-24 LAB — URINALYSIS, ROUTINE W REFLEX MICROSCOPIC
Bilirubin Urine: NEGATIVE
Ketones, ur: 15 mg/dL — AB
Nitrite: NEGATIVE
Specific Gravity, Urine: 1.019 (ref 1.005–1.030)
Urobilinogen, UA: 1 mg/dL (ref 0.0–1.0)

## 2013-08-24 LAB — RETICULOCYTES
RBC.: 4.67 MIL/uL (ref 3.87–5.11)
Retic Count, Absolute: 116.8 10*3/uL (ref 19.0–186.0)
Retic Ct Pct: 2.5 % (ref 0.4–3.1)

## 2013-08-24 LAB — COMPREHENSIVE METABOLIC PANEL
Albumin: 3.5 g/dL (ref 3.5–5.2)
BUN: 4 mg/dL — ABNORMAL LOW (ref 6–23)
Creatinine, Ser: 0.68 mg/dL (ref 0.50–1.10)
Total Protein: 6.9 g/dL (ref 6.0–8.3)

## 2013-08-24 LAB — URINE MICROSCOPIC-ADD ON

## 2013-08-24 LAB — VITAMIN B12: Vitamin B-12: 340 pg/mL (ref 211–911)

## 2013-08-24 LAB — MAGNESIUM: Magnesium: 1.6 mg/dL (ref 1.5–2.5)

## 2013-08-24 MED ORDER — POLYETHYLENE GLYCOL 3350 17 G PO PACK
17.0000 g | PACK | Freq: Every day | ORAL | Status: DC
  Administered 2013-08-24 – 2013-08-26 (×3): 17 g via ORAL
  Filled 2013-08-24 (×4): qty 1

## 2013-08-24 MED ORDER — POTASSIUM CHLORIDE CRYS ER 20 MEQ PO TBCR
20.0000 meq | EXTENDED_RELEASE_TABLET | Freq: Once | ORAL | Status: AC
  Administered 2013-08-24: 20 meq via ORAL
  Filled 2013-08-24: qty 1

## 2013-08-24 MED ORDER — ONDANSETRON HCL 4 MG/2ML IJ SOLN: 4.0000 mg | Freq: Four times a day (QID) | INTRAMUSCULAR | Status: DC | PRN

## 2013-08-24 MED ORDER — FENTANYL CITRATE 0.05 MG/ML IJ SOLN
100.0000 ug | Freq: Once | INTRAMUSCULAR | Status: AC
  Administered 2013-08-24: 100 ug via INTRAVENOUS
  Filled 2013-08-24: qty 2

## 2013-08-24 MED ORDER — FOLIC ACID 0.5 MG HALF TAB
500.0000 ug | ORAL_TABLET | Freq: Every morning | ORAL | Status: DC
  Administered 2013-08-24 – 2013-08-26 (×3): 0.5 mg via ORAL
  Filled 2013-08-24 (×4): qty 1

## 2013-08-24 MED ORDER — DEXTROSE 5 % IV SOLN
1.0000 g | INTRAVENOUS | Status: DC
  Administered 2013-08-24 – 2013-08-25 (×2): 1 g via INTRAVENOUS
  Filled 2013-08-24 (×3): qty 10

## 2013-08-24 MED ORDER — PREDNISONE 50 MG PO TABS
50.0000 mg | ORAL_TABLET | Freq: Every day | ORAL | Status: DC
  Administered 2013-08-24 – 2013-08-25 (×2): 50 mg via ORAL
  Filled 2013-08-24 (×2): qty 1

## 2013-08-24 MED ORDER — ALBUTEROL SULFATE (5 MG/ML) 0.5% IN NEBU
2.5000 mg | INHALATION_SOLUTION | Freq: Four times a day (QID) | RESPIRATORY_TRACT | Status: DC
  Administered 2013-08-24 – 2013-08-25 (×5): 2.5 mg via RESPIRATORY_TRACT
  Filled 2013-08-24 (×5): qty 0.5

## 2013-08-24 MED ORDER — SODIUM CHLORIDE 0.9 % IV BOLUS (SEPSIS)
1000.0000 mL | Freq: Once | INTRAVENOUS | Status: AC
  Administered 2013-08-24: 1000 mL via INTRAVENOUS

## 2013-08-24 MED ORDER — FLUTICASONE PROPIONATE 50 MCG/ACT NA SUSP
2.0000 | Freq: Every day | NASAL | Status: DC
Start: 2013-08-25 — End: 2013-08-26
  Administered 2013-08-25 – 2013-08-26 (×2): 2 via NASAL
  Filled 2013-08-24: qty 16

## 2013-08-24 MED ORDER — BUDESONIDE-FORMOTEROL FUMARATE 160-4.5 MCG/ACT IN AERO
2.0000 | INHALATION_SPRAY | Freq: Two times a day (BID) | RESPIRATORY_TRACT | Status: DC
  Administered 2013-08-24 – 2013-08-26 (×4): 2 via RESPIRATORY_TRACT
  Filled 2013-08-24: qty 6

## 2013-08-24 MED ORDER — ONDANSETRON HCL 4 MG/2ML IJ SOLN
4.0000 mg | Freq: Once | INTRAMUSCULAR | Status: AC
  Administered 2013-08-24: 4 mg via INTRAVENOUS
  Filled 2013-08-24: qty 2

## 2013-08-24 MED ORDER — ALBUTEROL SULFATE (5 MG/ML) 0.5% IN NEBU: 2.5000 mg | INHALATION_SOLUTION | RESPIRATORY_TRACT | Status: DC | PRN

## 2013-08-24 MED ORDER — HYDROMORPHONE HCL PF 1 MG/ML IJ SOLN
1.0000 mg | INTRAMUSCULAR | Status: DC | PRN
Start: 2013-08-24 — End: 2013-08-25
  Administered 2013-08-24 – 2013-08-25 (×2): 1 mg via INTRAVENOUS
  Filled 2013-08-24 (×2): qty 1

## 2013-08-24 MED ORDER — IOHEXOL 300 MG/ML  SOLN
100.0000 mL | Freq: Once | INTRAMUSCULAR | Status: AC | PRN
  Administered 2013-08-24: 100 mL via INTRAVENOUS

## 2013-08-24 MED ORDER — METOPROLOL TARTRATE 1 MG/ML IV SOLN
5.0000 mg | INTRAVENOUS | Status: DC | PRN
  Filled 2013-08-24: qty 5

## 2013-08-24 MED ORDER — POTASSIUM CHLORIDE IN NACL 40-0.9 MEQ/L-% IV SOLN
INTRAVENOUS | Status: DC
  Administered 2013-08-24: 100 mL/h via INTRAVENOUS
  Administered 2013-08-25: 05:00:00 via INTRAVENOUS
  Administered 2013-08-25 (×2): 20 mL/h via INTRAVENOUS
  Filled 2013-08-24 (×3): qty 1000

## 2013-08-24 MED ORDER — POTASSIUM CHLORIDE 10 MEQ/100ML IV SOLN
10.0000 meq | INTRAVENOUS | Status: AC
  Administered 2013-08-24 (×4): 10 meq via INTRAVENOUS
  Filled 2013-08-24 (×3): qty 100

## 2013-08-24 MED ORDER — FAMOTIDINE 20 MG PO TABS
20.0000 mg | ORAL_TABLET | Freq: Two times a day (BID) | ORAL | Status: DC
  Administered 2013-08-24 – 2013-08-26 (×5): 20 mg via ORAL
  Filled 2013-08-24 (×6): qty 1

## 2013-08-24 MED ORDER — HYDRALAZINE HCL 20 MG/ML IJ SOLN
5.0000 mg | Freq: Four times a day (QID) | INTRAMUSCULAR | Status: DC | PRN
  Administered 2013-08-24 – 2013-08-25 (×2): 5 mg via INTRAVENOUS
  Filled 2013-08-24 (×2): qty 1

## 2013-08-24 MED ORDER — INFLUENZA VAC SPLIT QUAD 0.5 ML IM SUSP
0.5000 mL | INTRAMUSCULAR | Status: AC
  Administered 2013-08-25: 0.5 mL via INTRAMUSCULAR
  Filled 2013-08-24 (×2): qty 0.5

## 2013-08-24 MED ORDER — ENOXAPARIN SODIUM 40 MG/0.4ML ~~LOC~~ SOLN
40.0000 mg | SUBCUTANEOUS | Status: DC
  Administered 2013-08-24 – 2013-08-25 (×2): 40 mg via SUBCUTANEOUS
  Filled 2013-08-24 (×3): qty 0.4

## 2013-08-24 NOTE — ED Notes (Signed)
Per EMS, asthma attack-given albuterol 5 mg and 0.5 mg of atrovent-relieved symptoms-also c/o LLQ pain which started yesterday-c/o constipation

## 2013-08-24 NOTE — Progress Notes (Signed)
Ur completed     

## 2013-08-24 NOTE — H&P (Addendum)
[facility] History and Physical  [name] [name] ZOX:[id number] DOB: 1976-10-02 DOA: 08/24/2013  Referring physician: EDP PCP: No Pcp   Chief Complaint: abd pain and asthma  HPI: [name] [name] is a 37 y.o. female with PMH of Asthma, Morbid obesity, DM, HTN, chronic hypokalemia, uterine fibroids presents to the ER by EMS today -She has multiple complaints, reportedly has been having L sided abd pain with nausea and vomiting for past couple of days. She describes the pain and sharp, radiating down to the suprapubic region, and L flank. She denies any diarrhea, last BM yesterday small, prior to that 1 week ago. She also reports fevers and chills on and off for 2 days. After she threw up her food and medicines yetserday she noticed that her breathing was getting bad too and started wheezing, this morning called EMS and presented to ER. EN route was given Albuterol nebs and atrovent with improvement in her breathing. In ER, continues to have mild L sided abd pain, WBC 11K, K of 2.8, UA cloudy with few WBC and many bacteria.   Review of Systems: The patient denies anorexia, fever, weight loss,, vision loss, decreased hearing, hoarseness, chest pain, syncope, dyspnea on exertion, peripheral edema, balance deficits, hemoptysis, abdominal pain, melena, hematochezia, severe indigestion/heartburn, hematuria, incontinence, genital sores, muscle weakness, suspicious skin lesions, transient blindness, difficulty walking, depression, unusual weight change, abnormal bleeding, enlarged lymph nodes, angioedema, and breast masses.    Past Medical History  Diagnosis Date  . Asthma   . Hypertension   . Chronic anemia   . Abnormal TSH   . Morbid obesity   . GERD (gastroesophageal reflux disease)   . Seasonal allergies   . Nasal polyps   . Diabetes mellitus     type 2  . Chronic bronchitis   . Sleep apnea   . Shortness of breath 12/14/11    "related to how bad my asthma is; sometimes @ rest,  lying down, w/exertion"  . Pneumonia   . Blood transfusion   . Inguinal hernia unilateral, non-recurrent     right  . Kidney infection 2008  . Headache(784.0)   . Anxiety   . Depression   . PTSD (post-traumatic stress disorder)     "related to prior hospitalizations; things that happened when I was in hospital"  . Asthma    Past Surgical History  Procedure Laterality Date  . Cervical cerclage  1999; 2002  . Tubal ligation  2002  . Cervical cerclage    . Tubal ligation     Social History:  reports that she has never smoked. She has never used smokeless tobacco. She reports that she does not drink alcohol or use illicit drugs. Lives at home with daughter, independent   Allergies  Allergen Reactions  . Aspirin Anaphylaxis  . Aspirin Anaphylaxis  . Ibuprofen Anaphylaxis  . Shellfish Allergy Hives and Swelling    Facial and oral swelling  . Menthol Swelling    Tongue swelling  . Other Other (See Comments)    Plastic thermometer covers cause sloughing off of skin on tongue and inside mouth    Family History  Problem Relation Age of Onset  . Diabetes Mother   . Heart disease Father     Prior to Admission medications   Medication Sig Start Date End Date Taking? Authorizing Provider  albuterol (PROVENTIL HFA;VENTOLIN HFA) 108 (90 BASE) MCG/ACT inhaler Inhale 2 puffs into the lungs every 6 (six) hours as needed for wheezing or shortness of breath.  Yes Historical Provider, MD  budesonide-formoterol (SYMBICORT) 160-4.5 MCG/ACT inhaler Inhale 2 puffs into the lungs 2 (two) times daily.   Yes Historical Provider, MD  diphenhydrAMINE (BENADRYL) 25 mg capsule Take 25 mg by mouth daily as needed for allergies.    Yes Historical Provider, MD  fluticasone (FLONASE) 50 MCG/ACT nasal spray Place 2 sprays into the nose daily. 05/30/13  Yes Henderson Cloud, MD  folic acid (FOLVITE) 400 MCG tablet Take 400 mcg by mouth every morning.    Yes Historical Provider, MD   ipratropium-albuterol (DUONEB) 0.5-2.5 (3) MG/3ML SOLN Take 3 mLs by nebulization every 4 (four) hours as needed (for wheezing or shortness of breath).  09/01/12  Yes Heather Zenaida Niece Wingen, PA-C  irbesartan-hydrochlorothiazide (AVALIDE) 300-25 MG per tablet Take 1 tablet by mouth every morning.    Yes Historical Provider, MD  Potassium 99 MG TABS Take 1 tablet by mouth 4 (four) times daily.    Yes Historical Provider, MD  ranitidine (ZANTAC) 150 MG capsule Take 150 mg by mouth daily. For heartburn   Yes Historical Provider, MD   Physical Exam: Filed Vitals:   08/24/13 0915  BP: 142/69  Pulse: 123  Temp: 97.4 F (36.3 C)  Resp: 16     General:  AAOx3, uncomfortable appearing  HEENt: PERRLA, EOMI  Cardiovascular: S1S2/RRR  Respiratory: diminished BS, no wheezes  Abdomen: soft, L sided tenderness, no rigidity or rebound, BS diminished but present  Skin: no rashes   Musculoskeletal: no edema c/c  Psychiatric: appropriate mood and affect  Neurologic: nonfocal  Labs on Admission:  Basic Metabolic Panel:  Recent Labs Lab 08/24/13 1021  NA 138  K 2.8*  CL 101  CO2 24  GLUCOSE 176*  BUN 4*  CREATININE 0.68  CALCIUM 8.8  MG 1.6   Liver Function Tests:  Recent Labs Lab 08/24/13 1021  AST 13  ALT 12  ALKPHOS 90  BILITOT 0.3  PROT 6.9  ALBUMIN 3.5    Recent Labs Lab 08/24/13 1021  LIPASE 14   No results found for this basename: AMMONIA,  in the last 168 hours CBC:  Recent Labs Lab 08/24/13 1021  WBC 11.1*  NEUTROABS 8.2*  HGB 8.4*  HCT 29.7*  MCV 64.4*  PLT 374   Cardiac Enzymes: No results found for this basename: CKTOTAL, CKMB, CKMBINDEX, TROPONINI,  in the last 168 hours  BNP (last 3 results)  Recent Labs  05/28/13 1821  PROBNP 56.7   CBG: No results found for this basename: GLUCAP,  in the last 168 hours  Radiological Exams on Admission: Dg Abd 1 View  08/24/2013   CLINICAL DATA:  Abdominal pain in the left lower quadrant. Nausea,  vomiting, and constipation.  EXAM: ABDOMEN - 1 VIEW  COMPARISON:  None.  FINDINGS: Borderline prominence of stool proximally in the colon.  Sclerosis along both sacroiliac joints with sacroiliac joint widening.  Mild degenerative axial loss of articular space in the hips.  No dilated small bowel identified.  IMPRESSION: 1. Bilateral sacroiliitis, with subcortical sclerosis and sacroiliac joint widening/erosion. 2. Mild degenerative loss of articular space in the hips. 3. Borderline appearance for constipation.   Electronically Signed   By: Herbie Baltimore   On: 08/24/2013 12:13     Assessment/Plan Active Problems:   GERD (gastroesophageal reflux disease)   Chronic anemia   Type 2 diabetes mellitus   Asthma   Hyperglycemia   Abdominal pain, acute   HTN (hypertension)   1. Abdominal pain/N and Vomiting -  Differential includes Pyelonephritis vs Constipation vs GYN related -STAT Urine pregnancy -CT abdomen pelvis with IV contrast -clears, supportive care -empiric ceftriaxone -FU Urine Cx  2. Asthma mild exacerbation -improved, prednisone 50mg  x1 today then quick taper -albuterol nebs  3. Hypokalemia -chronic, likely diuretics related -replace IV and PO -stop HCTZ indefinitely  4. HTN -stable, stop HCTZ, resume ARB in 1-2days  5. DM -add SSI, hbaic 6.6 in 7/14  6. Anemia -chronic, microcytic, likely Iron defi from fibroids and heavy menstrual blood loss  DVT proph: lovenox  Code Status: FuLL Family Communication: none at bedside Disposition Plan: inpatient  Time spent:  [facility] Pager [phone]  If 7PM-7AM, please contact night-coverage [email or web address] Password [facility] 08/24/2013, 1:34 PM

## 2013-08-24 NOTE — ED Notes (Signed)
Attempted to call report to floor.  Nurse will call back she was in a contact room.

## 2013-08-24 NOTE — Progress Notes (Signed)
Received report from RN in ED, pt will transfer to floor after abd. CT.

## 2013-08-24 NOTE — ED Notes (Signed)
Restarted IV in right AC.  Fluids infusing again on pump.

## 2013-08-24 NOTE — ED Notes (Signed)
Urine sent to lab for pregnancy test.

## 2013-08-24 NOTE — ED Notes (Signed)
Patient's IV came out while in radiology.  200 ml NS in.

## 2013-08-24 NOTE — ED Provider Notes (Signed)
CSN: [id number]     Arrival date & time 08/24/13  0908 History   First MD Initiated Contact with Patient 08/24/13 [phone]     Chief Complaint  Patient presents with  . Asthma  . LLQ pain     HPI Patient with left sided abdominal pain radiating to lower abdominal area. Patient reports nausea and vomiting, pain with urination. Rates pain as a 9/10.  Patient also complains of constipation.  Patient admitted for similar complaints in November of 2013 and had diagnostic workup which included ultrasound and it showed a fibroid tumor.  Patient has not had that addressed since then.  Patient states she's been unable hold down even liquids.  Past Medical History  Diagnosis Date  . Asthma   . Hypertension   . Chronic anemia   . Abnormal TSH   . Morbid obesity   . GERD (gastroesophageal reflux disease)   . Seasonal allergies   . Nasal polyps   . Diabetes mellitus     type 2  . Chronic bronchitis   . Sleep apnea   . Shortness of breath 12/14/11    "related to how bad my asthma is; sometimes @ rest, lying down, w/exertion"  . Pneumonia   . Blood transfusion   . Inguinal hernia unilateral, non-recurrent     right  . Kidney infection 2008  . Headache(784.0)   . Anxiety   . Depression   . PTSD (post-traumatic stress disorder)     "related to prior hospitalizations; things that happened when I was in hospital"  . Asthma    Past Surgical History  Procedure Laterality Date  . Cervical cerclage  1999; 2002  . Tubal ligation  2002  . Cervical cerclage    . Tubal ligation     Family History  Problem Relation Age of Onset  . Diabetes Mother   . Heart disease Father    History  Substance Use Topics  . Smoking status: Never Smoker   . Smokeless tobacco: Never Used  . Alcohol Use: No     Comment: 12/14/11 "only on birthdays; parties, and such"   OB History   Grav Para Term Preterm Abortions TAB SAB Ect Mult Living                 Review of Systems All other systems reviewed and are  negative Allergies  Aspirin; Aspirin; Ibuprofen; Shellfish allergy; Menthol; and Other  Home Medications   No current outpatient prescriptions on file. BP 122/59  Pulse 100  Temp(Src) 98 F (36.7 C) (Axillary)  Resp 18  Ht 5\' 6"  (1.676 m)  Wt 288 lb 12.8 oz (131 kg)  BMI 46.64 kg/m2  SpO2 92%  LMP 08/10/2013 Physical Exam  Nursing note and vitals reviewed. Constitutional: She is oriented to person, place, and time. She appears well-developed and well-nourished.  Non-toxic appearance. She does not appear ill. She appears distressed (Appears distressed from pain).  Morbidly obese  HENT:  Head: Normocephalic and atraumatic.  Eyes: Pupils are equal, round, and reactive to light.  Neck: Normal range of motion.  Cardiovascular: Normal rate and intact distal pulses.   Pulmonary/Chest: No respiratory distress.  Abdominal: Normal appearance. She exhibits no distension.  Musculoskeletal: Normal range of motion.  Neurological: She is alert and oriented to person, place, and time. No cranial nerve deficit.  Skin: Skin is warm and dry. No rash noted.  Psychiatric: She has a normal mood and affect. Her behavior is normal.    ED Course  Procedures (including critical care time) Labs Review Labs Reviewed  URINALYSIS, ROUTINE W REFLEX MICROSCOPIC - Abnormal; Notable for the following:    APPearance CLOUDY (*)    Glucose, UA 100 (*)    Ketones, ur 15 (*)    Leukocytes, UA TRACE (*)    All other components within normal limits  COMPREHENSIVE METABOLIC PANEL - Abnormal; Notable for the following:    Potassium 2.8 (*)    Glucose, Bld 176 (*)    BUN 4 (*)    All other components within normal limits  CBC WITH DIFFERENTIAL - Abnormal; Notable for the following:    WBC 11.1 (*)    Hemoglobin 8.4 (*)    HCT 29.7 (*)    MCV 64.4 (*)    MCH 18.2 (*)    MCHC 28.3 (*)    RDW 20.0 (*)    Neutro Abs 8.2 (*)    All other components within normal limits  URINE MICROSCOPIC-ADD ON - Abnormal;  Notable for the following:    Squamous Epithelial / LPF FEW (*)    Bacteria, UA MANY (*)    All other components within normal limits  IRON AND TIBC - Abnormal; Notable for the following:    Iron <10 (*)    UIBC 414 (*)    All other components within normal limits  FERRITIN - Abnormal; Notable for the following:    Ferritin 4 (*)    All other components within normal limits  CBC - Abnormal; Notable for the following:    WBC 12.4 (*)    Hemoglobin 7.4 (*)    HCT 26.5 (*)    MCV 64.6 (*)    MCH 18.0 (*)    MCHC 27.9 (*)    RDW 20.2 (*)    All other components within normal limits  BASIC METABOLIC PANEL - Abnormal; Notable for the following:    Glucose, Bld 148 (*)    BUN 4 (*)    All other components within normal limits  URINE CULTURE  LIPASE, BLOOD  MAGNESIUM  PREGNANCY, URINE  VITAMIN B12  FOLATE  RETICULOCYTES   Imaging Review Dg Abd 1 View  08/24/2013   CLINICAL DATA:  Abdominal pain in the left lower quadrant. Nausea, vomiting, and constipation.  EXAM: ABDOMEN - 1 VIEW  COMPARISON:  None.  FINDINGS: Borderline prominence of stool proximally in the colon.  Sclerosis along both sacroiliac joints with sacroiliac joint widening.  Mild degenerative axial loss of articular space in the hips.  No dilated small bowel identified.  IMPRESSION: 1. Bilateral sacroiliitis, with subcortical sclerosis and sacroiliac joint widening/erosion. 2. Mild degenerative loss of articular space in the hips. 3. Borderline appearance for constipation.   Electronically Signed   By: Herbie Baltimore   On: 08/24/2013 12:13      MDM   1. Anemia due to chronic illness   2. Asthma   3. GERD (gastroesophageal reflux disease)   4. Abdominal pain, acute   5. HTN (hypertension)       Nelia Shi, MD 08/25/13 [phone]

## 2013-08-24 NOTE — ED Notes (Signed)
Patient with left sided abdominal pain radiating to lower abdominal area.  Patient reports nausea and vomiting, pain with urination.  Rates pain as a 9/10.

## 2013-08-25 LAB — URINE CULTURE

## 2013-08-25 LAB — BASIC METABOLIC PANEL
CO2: 20 mEq/L (ref 19–32)
Calcium: 8.8 mg/dL (ref 8.4–10.5)
Chloride: 105 mEq/L (ref 96–112)
Creatinine, Ser: 0.58 mg/dL (ref 0.50–1.10)
GFR calc Af Amer: >90 mL/min (ref >90)
Sodium: 138 mEq/L (ref 135–145)

## 2013-08-25 LAB — CBC
Hemoglobin: 7.4 g/dL — ABNORMAL LOW (ref 12.0–15.0)
MCH: 18 pg — ABNORMAL LOW (ref 26.0–34.0)
Platelets: 314 10*3/uL (ref 150–400)
RBC: 4.1 MIL/uL (ref 3.87–5.11)
WBC: 12.4 10*3/uL — ABNORMAL HIGH (ref 4.0–10.5)

## 2013-08-25 MED ORDER — FERUMOXYTOL INJECTION 510 MG/17 ML
510.0000 mg | Freq: Once | INTRAVENOUS | Status: AC
  Administered 2013-08-25: 510 mg via INTRAVENOUS
  Filled 2013-08-25: qty 17

## 2013-08-25 MED ORDER — PREDNISONE 20 MG PO TABS
40.0000 mg | ORAL_TABLET | Freq: Every day | ORAL | Status: DC
  Administered 2013-08-26: 40 mg via ORAL
  Filled 2013-08-25 (×2): qty 2

## 2013-08-25 NOTE — Progress Notes (Signed)
[facility] PROGRESS NOTE  [name] [name] ZOX:[id number] DOB: 09-16-1976 DOA: 08/24/2013 PCP: No Pcp  Assessment/Plan: 1. Abdominal pain/N and Vomiting  -Likely ovarian cysts vs Constipation vs UTI -Urine pregnancy negative -CT abdomen pelvis with ovarian cysts no other pathology -advance diet, laxatives, supportive care  -empiric ceftriaxone  -FU Urine Cx   2. Asthma mild exacerbation  -improved, cut down prednisone quick taper  -albuterol nebs   3. Hypokalemia  -chronic, likely diuretics related  -improved -stop HCTZ indefinitely   4. HTN  -stable, stop HCTZ, resume ARB in 1-2days   5. DM  -add SSI, hbaic 6.6 in 7/14   6. Anemia  -chronic, microcytic, likely Iron defi from fibroids and heavy menstrual blood loss  -add IV Iron, will need Iron at DC  DVT proph: lovenox   Code Status: Full Family Communication: none at bedside Disposition Plan: home tomorrow if stable    HPI/Subjective: Occasional cough and wheeze, abdominal pain better, no BM yet  Objective: Filed Vitals:   08/25/13 1404  BP: 101/66  Pulse: 107  Temp: 98.7 F (37.1 C)  Resp: 16    Intake/Output Summary (Last 24 hours) at 08/25/13 1612 Last data filed at 08/25/13 0900  Gross per 24 hour  Intake 1708.33 ml  Output      1 ml  Net 1707.33 ml   Filed Weights   08/24/13 1521  Weight: 131 kg (288 lb 12.8 oz)    Exam:   General:  AAOx3  Cardiovascular: S1S2/RRR  Respiratory: CTAB  Abdomen: soft, NT, BS present  Musculoskeletal: no edema c/c   Data Reviewed: Basic Metabolic Panel:  Recent Labs Lab 08/24/13 1021 08/25/13 0516  NA 138 138  K 2.8* 3.9  CL 101 105  CO2 24 20  GLUCOSE 176* 148*  BUN 4* 4*  CREATININE 0.68 0.58  CALCIUM 8.8 8.8  MG 1.6  --    Liver Function Tests:  Recent Labs Lab 08/24/13 1021  AST 13  ALT 12  ALKPHOS 90  BILITOT 0.3  PROT 6.9  ALBUMIN 3.5    Recent Labs Lab 08/24/13 1021  LIPASE 14   No results found for this  basename: AMMONIA,  in the last 168 hours CBC:  Recent Labs Lab 08/24/13 1021 08/25/13 0516  WBC 11.1* 12.4*  NEUTROABS 8.2*  --   HGB 8.4* 7.4*  HCT 29.7* 26.5*  MCV 64.4* 64.6*  PLT 374 314   Cardiac Enzymes: No results found for this basename: CKTOTAL, CKMB, CKMBINDEX, TROPONINI,  in the last 168 hours BNP (last 3 results)  Recent Labs  05/28/13 1821  PROBNP 56.7   CBG: No results found for this basename: GLUCAP,  in the last 168 hours  Recent Results (from the past 240 hour(s))  URINE CULTURE     Status: None   Collection Time    08/24/13 11:13 AM      Result Value Range Status   Specimen Description URINE, CLEAN CATCH   Final   Special Requests NONE   Final   Culture  Setup Time     Final   Value: 08/24/2013 14:08     Performed at [facility] Count     Final   Value: >=100,000 COLONIES/ML     Performed at [facility]   Culture     Final   Value: GROUP B STREP(S.AGALACTIAE)ISOLATED     Note: TESTING AGAINST S. AGALACTIAE NOT ROUTINELY PERFORMED DUE TO PREDICTABILITY OF AMP/PEN/VAN SUSCEPTIBILITY.  Performed at [facility]   Report Status 08/25/2013 FINAL   Final     Studies: Dg Abd 1 View  08/24/2013   CLINICAL DATA:  Abdominal pain in the left lower quadrant. Nausea, vomiting, and constipation.  EXAM: ABDOMEN - 1 VIEW  COMPARISON:  None.  FINDINGS: Borderline prominence of stool proximally in the colon.  Sclerosis along both sacroiliac joints with sacroiliac joint widening.  Mild degenerative axial loss of articular space in the hips.  No dilated small bowel identified.  IMPRESSION: 1. Bilateral sacroiliitis, with subcortical sclerosis and sacroiliac joint widening/erosion. 2. Mild degenerative loss of articular space in the hips. 3. Borderline appearance for constipation.   Electronically Signed   By: Herbie Baltimore   On: 08/24/2013 12:13   Ct Abdomen Pelvis W Contrast  08/24/2013   CLINICAL DATA:  Left lower quadrant  abdominal pain, nausea  EXAM: CT ABDOMEN AND PELVIS WITH CONTRAST  TECHNIQUE: Multidetector CT imaging of the abdomen and pelvis was performed using the standard protocol following bolus administration of intravenous contrast.  CONTRAST:  OMNIPAQUE IOHEXOL 300 MG/ML  SOLN  COMPARISON:  No similar prior exam is available at this institution for comparison or on [facility].  FINDINGS: The lung bases are clear. Hepatic hypodensity likely indicates underlying steatosis without focal abnormality or intrahepatic ductal dilatation. 2 mm too small to characterize right mid cortical renal hypodensities image 24 noted. Adrenal glands, left kidney, gallbladder, pancreas, and spleen are unremarkable. No ascites, lymphadenopathy, or free air.  No bowel wall thickening or focal segmental dilatation. The ovaries are enlarged and hypodense bilaterally with suggestion of underlying follicles not accurately measured on CT. Moderate free pelvic fluid is noted. The uterus is unremarkable. Appendix is normal. Bladder is decompressed but unremarkable. No acute osseous abnormality. Osteitis condensans ilii noted.  IMPRESSION: Enlarged hypodense ovaries bilaterally which could indicate the presence of polycystic ovarian syndrome in the appropriate clinical context but not optimally evaluated at CT. The presence of free fluid and asymmetric left ovarian prominence could indicate a recently ruptured physiologic cyst. Tubo-ovarian abscess or less likely ovarian torsion could appear similar.  Transabdominal and transvaginal pelvic ultrasound recommended for further evaluation.  No other abnormality in the abdomen or pelvis to explain the history of pain.   Electronically Signed   By: Christiana Pellant M.D.   On: 08/24/2013 15:13    Scheduled Meds: . albuterol  2.5 mg Nebulization QID  . budesonide-formoterol  2 puff Inhalation BID  . cefTRIAXone (ROCEPHIN)  IV  1 g Intravenous Q24H  . enoxaparin (LOVENOX) injection  40 mg  Subcutaneous Q24H  . famotidine  20 mg Oral BID  . fluticasone  2 spray Each Nare Daily  . folic acid  500 mcg Oral q morning - 10a  . polyethylene glycol  17 g Oral Daily  . [START ON 08/26/2013] predniSONE  40 mg Oral Q breakfast   Continuous Infusions: . 0.9 % NaCl with KCl 40 mEq / L 20 mL/hr (08/25/13 1531)    Active Problems:   GERD (gastroesophageal reflux disease)   Chronic anemia   Type 2 diabetes mellitus   Asthma   Hyperglycemia   Abdominal pain, acute   HTN (hypertension)    Time spent:    [facility]  [facility] Pager [phone]. If 7PM-7AM, please contact night-coverage at [email or web address], password [facility] 08/25/2013, 4:12 PM  LOS: 1 day

## 2013-08-26 DIAGNOSIS — J45901 Unspecified asthma with (acute) exacerbation: Secondary | ICD-10-CM

## 2013-08-26 DIAGNOSIS — R7309 Other abnormal glucose: Secondary | ICD-10-CM

## 2013-08-26 DIAGNOSIS — D509 Iron deficiency anemia, unspecified: Secondary | ICD-10-CM

## 2013-08-26 MED ORDER — PREDNISONE 20 MG PO TABS: 40.0000 mg | ORAL_TABLET | Freq: Every day | ORAL | Status: DC

## 2013-08-26 MED ORDER — FERROUS GLUCONATE 216 MG PO TABS: 216.0000 mg | ORAL_TABLET | Freq: Three times a day (TID) | ORAL | Status: DC

## 2013-08-26 MED ORDER — ALBUTEROL SULFATE (5 MG/ML) 0.5% IN NEBU
2.5000 mg | INHALATION_SOLUTION | Freq: Three times a day (TID) | RESPIRATORY_TRACT | Status: DC
  Administered 2013-08-26: 2.5 mg via RESPIRATORY_TRACT
  Filled 2013-08-26: qty 0.5

## 2013-08-26 MED ORDER — HYDROCODONE-ACETAMINOPHEN 5-325 MG PO TABS
1.0000 | ORAL_TABLET | Freq: Four times a day (QID) | ORAL | Status: DC | PRN
  Administered 2013-08-26: 1 via ORAL
  Filled 2013-08-26: qty 1

## 2013-08-26 MED ORDER — IRBESARTAN 300 MG PO TABS: 300.0000 mg | ORAL_TABLET | Freq: Every day | ORAL | Status: DC

## 2013-08-26 MED ORDER — ALBUTEROL SULFATE (5 MG/ML) 0.5% IN NEBU
2.5000 mg | INHALATION_SOLUTION | Freq: Four times a day (QID) | RESPIRATORY_TRACT | Status: DC | PRN
  Administered 2013-08-26: 2.5 mg via RESPIRATORY_TRACT
  Filled 2013-08-26: qty 0.5

## 2013-08-26 MED ORDER — CEPHALEXIN 500 MG PO CAPS: 500.0000 mg | ORAL_CAPSULE | Freq: Three times a day (TID) | ORAL | Status: DC

## 2013-08-26 NOTE — Progress Notes (Signed)
Patient being discharged home, discharge instruction given understanding verbalized, bus pass provide for patient, no additional concerns.

## 2013-08-26 NOTE — Care Management Note (Signed)
Cm spoke with patient at the bedside concerning no PCP on record. Cm provided pt with information concerning [facility] to establish Primary Care. Pt insured, no difficulties affording medications. Pt requested assistance with transportation. CSW provided pt with bus pass prior to discharge. No other needs assessed at this time.   Roxy Manns Letina Luckett,RN,MSN [phone]

## 2013-09-08 NOTE — Discharge Summary (Signed)
Physician Discharge Summary  [name] [name] ZOX:[id number] DOB: [birth year] DOA: 08/24/2013  PCP: No Pcp  Admit date: 08/24/2013 Discharge date: 09/08/2013  Time spent: 45 minutes  Recommendations for Outpatient Follow-up:  1. PCP in 1 week, seen by case manager and given resources for [facility]  Discharge Diagnoses:  Active Problems:   GERD (gastroesophageal reflux disease)   Chronic anemia   Type 2 diabetes mellitus   Asthma   Hyperglycemia   Abdominal pain, acute   HTN (hypertension)   Discharge Condition: stable  Diet recommendation: Low sodium, carb modified  Filed Weights   08/24/13 1521  Weight: 131 kg (288 lb 12.8 oz)    History of present illness:  Chief Complaint: abd pain and asthma  HPI: [name] [name] is a 37 y.o. female with PMH of Asthma, Morbid obesity, DM, HTN, chronic hypokalemia, uterine fibroids presents to the ER by EMS today  -She has multiple complaints, reportedly has been having L sided abd pain with nausea and vomiting for past couple of days.  She describes the pain and sharp, radiating down to the suprapubic region, and L flank. She denies any diarrhea, last BM yesterday small, prior to that 1 week ago.  She also reports fevers and chills on and off for 2 days.  After she threw up her food and medicines yetserday she noticed that her breathing was getting bad too and started wheezing, this morning called EMS and presented to ER.  EN route was given Albuterol nebs and atrovent with improvement in her breathing.  In ER, continues to have mild L sided abd pain, WBC 11K, K of 2.8, UA cloudy with few WBC and many bacteria.   Hospital Course:  1. Abdominal pain/N and Vomiting  -Likely ovarian cysts vs Constipation vs UTI  -Urine pregnancy negative  -CT abdomen pelvis with ovarian cysts no other pathology  -advance diet, laxatives, supportive care  -UA abnormal but without any urianry symptoms hence consistent with asymptomatic  bacteruria  2. Asthma mild exacerbation  -improved, treated with steroids, nebs - prednisone quick taper   3. Hypokalemia  -chronic, likely diuretics related  -improved  -stop HCTZ indefinitely   4. HTN  -stable, stopped HCTZ, irbesartan added by itself   5. DM  -add SSI, hbaic 6.6 in 7/14   6. Anemia  -chronic, microcytic, likely Iron defi from fibroids and heavy menstrual blood loss  -Given Iv Iron, started on Iron at DC   Discharge Exam: Filed Vitals:   08/26/13 0636  BP: 139/88  Pulse:   Temp:   Resp:     General: AAOx3 Cardiovascular: S1S2/RRR Respiratory: CTAB  Discharge Instructions  Discharge Orders   Future Orders Complete By Expires   Diet - low sodium heart healthy  As directed    Diet Carb Modified  As directed    Increase activity slowly  As directed        Medication List    STOP taking these medications       irbesartan-hydrochlorothiazide 300-25 MG per tablet  Commonly known as:  AVALIDE     Potassium 99 MG Tabs      TAKE these medications       albuterol 108 (90 BASE) MCG/ACT inhaler  Commonly known as:  PROVENTIL HFA;VENTOLIN HFA  Inhale 2 puffs into the lungs every 6 (six) hours as needed for wheezing or shortness of breath.     budesonide-formoterol 160-4.5 MCG/ACT inhaler  Commonly known as:  SYMBICORT  Inhale 2 puffs into the  lungs 2 (two) times daily.     cephALEXin 500 MG capsule  Commonly known as:  KEFLEX  Take 1 capsule (500 mg total) by mouth 3 (three) times daily. For 3 days     diphenhydrAMINE 25 mg capsule  Commonly known as:  BENADRYL  Take 25 mg by mouth daily as needed for allergies.     ferrous gluconate 216 MG tablet  Commonly known as:  FERGON  Take 1 tablet (216 mg total) by mouth 3 (three) times daily with meals. Start in 1 week     fluticasone 50 MCG/ACT nasal spray  Commonly known as:  FLONASE  Place 2 sprays into the nose daily.     folic acid 400 MCG tablet  Commonly known as:  FOLVITE  Take  400 mcg by mouth every morning.     ipratropium-albuterol 0.5-2.5 (3) MG/3ML Soln  Commonly known as:  DUONEB  Take 3 mLs by nebulization every 4 (four) hours as needed (for wheezing or shortness of breath).     irbesartan 300 MG tablet  Commonly known as:  AVAPRO  Take 1 tablet (300 mg total) by mouth at bedtime.     predniSONE 20 MG tablet  Commonly known as:  DELTASONE  Take 2 tablets (40 mg total) by mouth daily with breakfast. Take 40mg  for 2 days then 20mg  for 2 days then STOP     ranitidine 150 MG capsule  Commonly known as:  ZANTAC  Take 150 mg by mouth daily. For heartburn       Allergies  Allergen Reactions  . Aspirin Anaphylaxis  . Aspirin Anaphylaxis  . Ibuprofen Anaphylaxis  . Shellfish Allergy Hives and Swelling    Facial and oral swelling  . Menthol Swelling    Tongue swelling  . Other Other (See Comments)    Plastic thermometer covers cause sloughing off of skin on tongue and inside mouth       Follow-up Information   Follow up with PCP. Schedule an appointment as soon as possible for a visit in 10 days.      Follow up with Gynecologist. Schedule an appointment as soon as possible for a visit in 1 month.       The results of significant diagnostics from this hospitalization (including imaging, microbiology, ancillary and laboratory) are listed below for reference.    Significant Diagnostic Studies: Dg Abd 1 View  08/24/2013   CLINICAL DATA:  Abdominal pain in the left lower quadrant. Nausea, vomiting, and constipation.  EXAM: ABDOMEN - 1 VIEW  COMPARISON:  None.  FINDINGS: Borderline prominence of stool proximally in the colon.  Sclerosis along both sacroiliac joints with sacroiliac joint widening.  Mild degenerative axial loss of articular space in the hips.  No dilated small bowel identified.  IMPRESSION: 1. Bilateral sacroiliitis, with subcortical sclerosis and sacroiliac joint widening/erosion. 2. Mild degenerative loss of articular space in the hips.  3. Borderline appearance for constipation.   Electronically Signed   By: Herbie Baltimore   On: 08/24/2013 12:13   Ct Abdomen Pelvis W Contrast  08/24/2013   CLINICAL DATA:  Left lower quadrant abdominal pain, nausea  EXAM: CT ABDOMEN AND PELVIS WITH CONTRAST  TECHNIQUE: Multidetector CT imaging of the abdomen and pelvis was performed using the standard protocol following bolus administration of intravenous contrast.  CONTRAST:  OMNIPAQUE IOHEXOL 300 MG/ML  SOLN  COMPARISON:  No similar prior exam is available at this institution for comparison or on [facility].  FINDINGS: The  lung bases are clear. Hepatic hypodensity likely indicates underlying steatosis without focal abnormality or intrahepatic ductal dilatation. 2 mm too small to characterize right mid cortical renal hypodensities image 24 noted. Adrenal glands, left kidney, gallbladder, pancreas, and spleen are unremarkable. No ascites, lymphadenopathy, or free air.  No bowel wall thickening or focal segmental dilatation. The ovaries are enlarged and hypodense bilaterally with suggestion of underlying follicles not accurately measured on CT. Moderate free pelvic fluid is noted. The uterus is unremarkable. Appendix is normal. Bladder is decompressed but unremarkable. No acute osseous abnormality. Osteitis condensans ilii noted.  IMPRESSION: Enlarged hypodense ovaries bilaterally which could indicate the presence of polycystic ovarian syndrome in the appropriate clinical context but not optimally evaluated at CT. The presence of free fluid and asymmetric left ovarian prominence could indicate a recently ruptured physiologic cyst. Tubo-ovarian abscess or less likely ovarian torsion could appear similar.  Transabdominal and transvaginal pelvic ultrasound recommended for further evaluation.  No other abnormality in the abdomen or pelvis to explain the history of pain.   Electronically Signed   By: Christiana Pellant M.D.   On: 08/24/2013 15:13     Microbiology: No results found for this or any previous visit (from the past 240 hour(s)).   Labs: Basic Metabolic Panel: No results found for this basename: NA, K, CL, CO2, GLUCOSE, BUN, CREATININE, CALCIUM, MG, PHOS,  in the last 168 hours Liver Function Tests: No results found for this basename: AST, ALT, ALKPHOS, BILITOT, PROT, ALBUMIN,  in the last 168 hours No results found for this basename: LIPASE, AMYLASE,  in the last 168 hours No results found for this basename: AMMONIA,  in the last 168 hours CBC: No results found for this basename: WBC, NEUTROABS, HGB, HCT, MCV, PLT,  in the last 168 hours Cardiac Enzymes: No results found for this basename: CKTOTAL, CKMB, CKMBINDEX, TROPONINI,  in the last 168 hours BNP: BNP (last 3 results)  Recent Labs  05/28/13 1821  PROBNP 56.7   CBG: No results found for this basename: GLUCAP,  in the last 168 hours     Signed:  Stormie Ventola  [facility] 09/08/2013, 6:02 PM

## 2013-10-02 ENCOUNTER — Encounter (HOSPITAL_COMMUNITY): Payer: Self-pay | Admitting: Emergency Medicine

## 2013-10-02 ENCOUNTER — Emergency Department (HOSPITAL_COMMUNITY)
Admission: EM | Admit: 2013-10-02 | Discharge: 2013-10-02 | Disposition: A | Discharge status: Home / Self Care | Payer: Medicare Other | Attending: Emergency Medicine | Admitting: Emergency Medicine

## 2013-10-02 ENCOUNTER — Emergency Department (HOSPITAL_COMMUNITY): Payer: Medicare Other

## 2013-10-02 DIAGNOSIS — D638 Anemia in other chronic diseases classified elsewhere: Secondary | ICD-10-CM | POA: Insufficient documentation

## 2013-10-02 DIAGNOSIS — E876 Hypokalemia: Secondary | ICD-10-CM

## 2013-10-02 DIAGNOSIS — IMO0002 Reserved for concepts with insufficient information to code with codable children: Secondary | ICD-10-CM | POA: Insufficient documentation

## 2013-10-02 DIAGNOSIS — I10 Essential (primary) hypertension: Secondary | ICD-10-CM | POA: Insufficient documentation

## 2013-10-02 DIAGNOSIS — Z8659 Personal history of other mental and behavioral disorders: Secondary | ICD-10-CM | POA: Insufficient documentation

## 2013-10-02 DIAGNOSIS — R000 Tachycardia, unspecified: Secondary | ICD-10-CM | POA: Insufficient documentation

## 2013-10-02 DIAGNOSIS — J45901 Unspecified asthma with (acute) exacerbation: Secondary | ICD-10-CM | POA: Insufficient documentation

## 2013-10-02 DIAGNOSIS — Z8701 Personal history of pneumonia (recurrent): Secondary | ICD-10-CM | POA: Insufficient documentation

## 2013-10-02 DIAGNOSIS — Z87448 Personal history of other diseases of urinary system: Secondary | ICD-10-CM | POA: Insufficient documentation

## 2013-10-02 DIAGNOSIS — E119 Type 2 diabetes mellitus without complications: Secondary | ICD-10-CM | POA: Insufficient documentation

## 2013-10-02 DIAGNOSIS — K219 Gastro-esophageal reflux disease without esophagitis: Secondary | ICD-10-CM | POA: Insufficient documentation

## 2013-10-02 DIAGNOSIS — R509 Fever, unspecified: Secondary | ICD-10-CM | POA: Insufficient documentation

## 2013-10-02 DIAGNOSIS — Z79899 Other long term (current) drug therapy: Secondary | ICD-10-CM | POA: Insufficient documentation

## 2013-10-02 DIAGNOSIS — J339 Nasal polyp, unspecified: Secondary | ICD-10-CM | POA: Insufficient documentation

## 2013-10-02 LAB — CBC WITH DIFFERENTIAL/PLATELET
Basophils Absolute: 0 10*3/uL (ref 0.0–0.1)
Eosinophils Relative: 14 % — ABNORMAL HIGH (ref 0–5)
HCT: 29.9 % — ABNORMAL LOW (ref 36.0–46.0)
Lymphs Abs: 3.2 10*3/uL (ref 0.7–4.0)
MCH: 20.5 pg — ABNORMAL LOW (ref 26.0–34.0)
MCV: 68.3 fL — ABNORMAL LOW (ref 78.0–100.0)
Monocytes Relative: 7 % (ref 3–12)
Platelets: 351 10*3/uL (ref 150–400)
RBC: 4.38 MIL/uL (ref 3.87–5.11)
RDW: 21.6 % — ABNORMAL HIGH (ref 11.5–15.5)
WBC: 8.6 10*3/uL (ref 4.0–10.5)

## 2013-10-02 LAB — BASIC METABOLIC PANEL
BUN: 9 mg/dL (ref 6–23)
Calcium: 9.2 mg/dL (ref 8.4–10.5)
Chloride: 105 mEq/L (ref 96–112)
GFR calc Af Amer: >90 mL/min (ref >90)
Potassium: 2.9 mEq/L — ABNORMAL LOW (ref 3.5–5.1)
Sodium: 138 mEq/L (ref 135–145)

## 2013-10-02 MED ORDER — POTASSIUM CHLORIDE 10 MEQ/100ML IV SOLN
10.0000 meq | Freq: Once | INTRAVENOUS | Status: AC
  Administered 2013-10-02: 10 meq via INTRAVENOUS
  Filled 2013-10-02: qty 100

## 2013-10-02 MED ORDER — POTASSIUM CHLORIDE CRYS ER 20 MEQ PO TBCR
20.0000 meq | EXTENDED_RELEASE_TABLET | Freq: Once | ORAL | Status: AC
  Administered 2013-10-02: 20 meq via ORAL
  Filled 2013-10-02: qty 1

## 2013-10-02 NOTE — Progress Notes (Signed)
Patient confirms that she does not have a pcp.  EDCM instructed patient to call the phone number on the back of her insurance card or go to [employer] so that she may find a physician who is close to her and within network.  EDCM explained to patient importance of having a pcp.  Patient verbalized understanding.  No further needs at this time.

## 2013-10-02 NOTE — ED Notes (Signed)
Pt reports she took two breathing tx at home before calling EMS- given two in route as documented.

## 2013-10-02 NOTE — ED Notes (Signed)
Bed: UJ81 Expected date:  Expected time:  Means of arrival:  Comments: Hold

## 2013-10-02 NOTE — ED Notes (Signed)
Patient transported to X-ray 

## 2013-10-02 NOTE — ED Provider Notes (Signed)
  Medical screening examination/treatment/procedure(s) were performed by non-physician practitioner and as supervising physician I was immediately available for consultation/collaboration.  EKG Interpretation   None          Tacarra Justo, MD 10/02/13 2321 

## 2013-10-02 NOTE — ED Notes (Signed)
Pt presents shortness of reath x 3 days. Seen at PCP dx with sinus infection- increased SOB today. Productive cough and fever today. EMS upon arrival Albuterol/atrovent 5/0.5 mg given X 2 , solumedrol 125mg  given IN ROUTE for wheezing.  Improved wheezing with meds.

## 2013-10-02 NOTE — ED Provider Notes (Signed)
CSN: [id number]     Arrival date & time 10/02/13  1725 History   First MD Initiated Contact with Patient 10/02/13 1730     Chief Complaint  Patient presents with  . Asthma   (Consider location/radiation/quality/duration/timing/severity/associated sxs/prior Treatment) The history is provided by the patient and medical records.   This is a 37 year old female with past medical history significant for asthma, hypertension, chronic anemia, nasal polyps, diabetes, presenting to the ED for asthma exacerbation that started last night.  Pt seen multiple times in the ED for the same.  Patient states she has been short of breath and wheezing throughout the entire day today. Also notes an intermittent fever with productive cough with yellow/green sputum for the past few days.  Pt states she gave herself 2 nebulizer treatments prior to calling EMS without noted improvement.  Pt was given an additional nebulizer treatment and solu-medrol by EMS en route to the ED.  On arrival pt remains midly tachypneic, but able to speak in full complete sentences.  O2 sats 93% on RA.  Denies any chest pain or palpitations.  Past Medical History  Diagnosis Date  . Asthma   . Hypertension   . Chronic anemia   . Abnormal TSH   . Morbid obesity   . GERD (gastroesophageal reflux disease)   . Seasonal allergies   . Nasal polyps   . Diabetes mellitus     type 2  . Chronic bronchitis   . Sleep apnea   . Shortness of breath 12/14/11    "related to how bad my asthma is; sometimes @ rest, lying down, w/exertion"  . Pneumonia   . Blood transfusion   . Inguinal hernia unilateral, non-recurrent     right  . Kidney infection 2008  . Headache(784.0)   . Anxiety   . Depression   . PTSD (post-traumatic stress disorder)     "related to prior hospitalizations; things that happened when I was in hospital"  . Asthma    Past Surgical History  Procedure Laterality Date  . Cervical cerclage  1999; 2002  . Tubal ligation  2002   . Cervical cerclage    . Tubal ligation     Family History  Problem Relation Age of Onset  . Diabetes Mother   . Heart disease Father    History  Substance Use Topics  . Smoking status: Never Smoker   . Smokeless tobacco: Never Used  . Alcohol Use: No     Comment: 12/14/11 "only on birthdays; parties, and such"   OB History   Grav Para Term Preterm Abortions TAB SAB Ect Mult Living                 Review of Systems  Respiratory: Positive for shortness of breath and wheezing.   All other systems reviewed and are negative.    Allergies  Aspirin; Aspirin; Ibuprofen; Shellfish allergy; Menthol; and Other  Home Medications   Current Outpatient Rx  Name  Route  Sig  Dispense  Refill  . albuterol (PROVENTIL HFA;VENTOLIN HFA) 108 (90 BASE) MCG/ACT inhaler   Inhalation   Inhale 2 puffs into the lungs every 6 (six) hours as needed for wheezing or shortness of breath.         . budesonide-formoterol (SYMBICORT) 160-4.5 MCG/ACT inhaler   Inhalation   Inhale 2 puffs into the lungs 2 (two) times daily.         . cephALEXin (KEFLEX) 500 MG capsule   Oral   Take  1 capsule (500 mg total) by mouth 3 (three) times daily. For 3 days   6 capsule   0   . diphenhydrAMINE (BENADRYL) 25 mg capsule   Oral   Take 25 mg by mouth daily as needed for allergies.          . ferrous gluconate (FERGON) 216 MG tablet   Oral   Take 1 tablet (216 mg total) by mouth 3 (three) times daily with meals. Start in 1 week   60 tablet   0   . fluticasone (FLONASE) 50 MCG/ACT nasal spray   Nasal   Place 2 sprays into the nose daily.   16 g   2   . folic acid (FOLVITE) 400 MCG tablet   Oral   Take 400 mcg by mouth every morning.          Marland Kitchen ipratropium-albuterol (DUONEB) 0.5-2.5 (3) MG/3ML SOLN   Nebulization   Take 3 mLs by nebulization every 4 (four) hours as needed (for wheezing or shortness of breath).          . irbesartan (AVAPRO) 300 MG tablet   Oral   Take 1 tablet (300 mg  total) by mouth at bedtime.   30 tablet   0   . predniSONE (DELTASONE) 20 MG tablet   Oral   Take 2 tablets (40 mg total) by mouth daily with breakfast. Take 40mg  for 2 days then 20mg  for 2 days then STOP   6 tablet   0   . ranitidine (ZANTAC) 150 MG capsule   Oral   Take 150 mg by mouth daily. For heartburn          SpO2 93%  Physical Exam  Nursing note and vitals reviewed. Constitutional: She is oriented to person, place, and time. No distress.  obese  HENT:  Head: Normocephalic and atraumatic.  Mouth/Throat: Uvula is midline, oropharynx is clear and moist and mucous membranes are normal. No uvula swelling. No oropharyngeal exudate, posterior oropharyngeal edema, posterior oropharyngeal erythema or tonsillar abscesses.  Nasal polyps bilaterally; airway widely patent  Eyes: Conjunctivae and EOM are normal. Pupils are equal, round, and reactive to light.  Neck: Normal range of motion.  Cardiovascular: Regular rhythm and normal heart sounds.  Tachycardia present.   Pulmonary/Chest: No accessory muscle usage. Tachypnea noted. No respiratory distress. She has wheezes.  Minimally tachypneic without accessory muscle use or signs of distress; diffuse inspiratory and expiratory wheezes throughout; speaking in full complete sentences without difficulty  Musculoskeletal: Normal range of motion.  Neurological: She is alert and oriented to person, place, and time.  Skin: Skin is warm and dry. She is not diaphoretic.  Psychiatric: She has a normal mood and affect.    ED Course  Procedures (including critical care time) Labs Review Labs Reviewed - No data to display Imaging Review No results found.  EKG Interpretation   None       MDM   1. Asthma exacerbation, mild   2. Hypokalemia   3. Anemia of chronic disease    On arrival pt using 4th nebulizer treatment.  Will obtain CXR and screening labs due to chronic anemia and recurrent hypokalemia.  6:42 PM Pt re-assessed.  Has completed 4th nebulizer treatment and states she feels much better.  Breathing has slowed.  Had full conversation with pt regarding her sx, O2 sats remained stable at 95% while she was talking.  CXR clear.  Labs pending at this time.  9:17 PM KCl has finished.  Pts breathing remains at baseline without signs of distress, currently talking on the phone with her family members, O2 sats stable on RA.  Pt will be d/c.  I have strongly encouraged her to FU with a PCP in the area to maintain better control of her asthma-- referrals and resource guide given.  Discussed plan with pt, she agreed.  Strict return precautions advised should sx change or worsen.    Filed Vitals:   10/02/13 2156  BP: 140/81  Pulse: 127  Temp:   Resp: 18   Pt remains tachycardiac at time of discharge-- i suspect this is from large amounts of albuterol treatments given throughout the day today.  Garlon Hatchet, PA-C 10/02/13 2241

## 2013-10-03 ENCOUNTER — Inpatient Hospital Stay (HOSPITAL_COMMUNITY)
Admission: EM | Admit: 2013-10-03 | Discharge: 2013-10-06 | DRG: 202 | Disposition: A | Discharge status: Home / Self Care | Payer: Medicare Other | Attending: Internal Medicine | Admitting: Internal Medicine

## 2013-10-03 ENCOUNTER — Encounter (HOSPITAL_COMMUNITY): Payer: Self-pay | Admitting: Emergency Medicine

## 2013-10-03 DIAGNOSIS — R109 Unspecified abdominal pain: Secondary | ICD-10-CM

## 2013-10-03 DIAGNOSIS — Z6841 Body Mass Index (BMI) 40.0 and over, adult: Secondary | ICD-10-CM

## 2013-10-03 DIAGNOSIS — J9600 Acute respiratory failure, unspecified whether with hypoxia or hypercapnia: Secondary | ICD-10-CM | POA: Diagnosis present

## 2013-10-03 DIAGNOSIS — J45909 Unspecified asthma, uncomplicated: Secondary | ICD-10-CM

## 2013-10-03 DIAGNOSIS — G4733 Obstructive sleep apnea (adult) (pediatric): Secondary | ICD-10-CM

## 2013-10-03 DIAGNOSIS — J45902 Unspecified asthma with status asthmaticus: Secondary | ICD-10-CM

## 2013-10-03 DIAGNOSIS — J329 Chronic sinusitis, unspecified: Secondary | ICD-10-CM

## 2013-10-03 DIAGNOSIS — J42 Unspecified chronic bronchitis: Secondary | ICD-10-CM | POA: Diagnosis present

## 2013-10-03 DIAGNOSIS — D649 Anemia, unspecified: Secondary | ICD-10-CM

## 2013-10-03 DIAGNOSIS — Z79899 Other long term (current) drug therapy: Secondary | ICD-10-CM

## 2013-10-03 DIAGNOSIS — R000 Tachycardia, unspecified: Secondary | ICD-10-CM

## 2013-10-03 DIAGNOSIS — I10 Essential (primary) hypertension: Secondary | ICD-10-CM

## 2013-10-03 DIAGNOSIS — T380X5A Adverse effect of glucocorticoids and synthetic analogues, initial encounter: Secondary | ICD-10-CM | POA: Diagnosis present

## 2013-10-03 DIAGNOSIS — F329 Major depressive disorder, single episode, unspecified: Secondary | ICD-10-CM | POA: Diagnosis present

## 2013-10-03 DIAGNOSIS — N920 Excessive and frequent menstruation with regular cycle: Secondary | ICD-10-CM | POA: Diagnosis present

## 2013-10-03 DIAGNOSIS — F3289 Other specified depressive episodes: Secondary | ICD-10-CM | POA: Diagnosis present

## 2013-10-03 DIAGNOSIS — G4730 Sleep apnea, unspecified: Secondary | ICD-10-CM | POA: Diagnosis present

## 2013-10-03 DIAGNOSIS — D509 Iron deficiency anemia, unspecified: Secondary | ICD-10-CM

## 2013-10-03 DIAGNOSIS — E876 Hypokalemia: Secondary | ICD-10-CM

## 2013-10-03 DIAGNOSIS — F4310 Post-traumatic stress disorder, unspecified: Secondary | ICD-10-CM | POA: Diagnosis present

## 2013-10-03 DIAGNOSIS — E119 Type 2 diabetes mellitus without complications: Secondary | ICD-10-CM

## 2013-10-03 DIAGNOSIS — R739 Hyperglycemia, unspecified: Secondary | ICD-10-CM

## 2013-10-03 DIAGNOSIS — F411 Generalized anxiety disorder: Secondary | ICD-10-CM | POA: Diagnosis present

## 2013-10-03 DIAGNOSIS — J9601 Acute respiratory failure with hypoxia: Secondary | ICD-10-CM | POA: Diagnosis present

## 2013-10-03 DIAGNOSIS — K219 Gastro-esophageal reflux disease without esophagitis: Secondary | ICD-10-CM

## 2013-10-03 DIAGNOSIS — J40 Bronchitis, not specified as acute or chronic: Secondary | ICD-10-CM

## 2013-10-03 DIAGNOSIS — J45901 Unspecified asthma with (acute) exacerbation: Principal | ICD-10-CM

## 2013-10-03 DIAGNOSIS — D638 Anemia in other chronic diseases classified elsewhere: Secondary | ICD-10-CM

## 2013-10-03 LAB — CBC WITH DIFFERENTIAL/PLATELET
Basophils Relative: 0 % (ref 0–1)
Eosinophils Relative: 0 % (ref 0–5)
HCT: 29.1 % — ABNORMAL LOW (ref 36.0–46.0)
Hemoglobin: 8.7 g/dL — ABNORMAL LOW (ref 12.0–15.0)
Lymphocytes Relative: 26 % (ref 12–46)
MCH: 20.5 pg — ABNORMAL LOW (ref 26.0–34.0)
MCHC: 29.9 g/dL — ABNORMAL LOW (ref 30.0–36.0)
Monocytes Absolute: 1.1 10*3/uL — ABNORMAL HIGH (ref 0.1–1.0)
Monocytes Relative: 10 % (ref 3–12)
Neutro Abs: 7.2 10*3/uL (ref 1.7–7.7)
RBC: 4.24 MIL/uL (ref 3.87–5.11)
WBC: 11.2 10*3/uL — ABNORMAL HIGH (ref 4.0–10.5)

## 2013-10-03 LAB — BASIC METABOLIC PANEL
BUN: 9 mg/dL (ref 6–23)
CO2: 22 mEq/L (ref 19–32)
Chloride: 107 mEq/L (ref 96–112)
Glucose, Bld: 163 mg/dL — ABNORMAL HIGH (ref 70–99)
Potassium: 3.1 mEq/L — ABNORMAL LOW (ref 3.5–5.1)
Sodium: 142 mEq/L (ref 135–145)

## 2013-10-03 MED ORDER — AZITHROMYCIN 250 MG PO TABS: 250.0000 mg | ORAL_TABLET | Freq: Every day | ORAL | Status: DC

## 2013-10-03 MED ORDER — METHYLPREDNISOLONE SODIUM SUCC 125 MG IJ SOLR
125.0000 mg | Freq: Once | INTRAMUSCULAR | Status: AC
  Administered 2013-10-03: 125 mg via INTRAVENOUS
  Filled 2013-10-03: qty 2

## 2013-10-03 MED ORDER — ALBUTEROL SULFATE (2.5 MG/3ML) 0.083% IN NEBU: 2.5000 mg | INHALATION_SOLUTION | RESPIRATORY_TRACT | Status: DC | PRN

## 2013-10-03 MED ORDER — IPRATROPIUM BROMIDE 0.02 % IN SOLN
0.5000 mg | Freq: Once | RESPIRATORY_TRACT | Status: AC
  Administered 2013-10-03: 0.5 mg via RESPIRATORY_TRACT
  Filled 2013-10-03: qty 2.5

## 2013-10-03 MED ORDER — ALBUTEROL SULFATE (5 MG/ML) 0.5% IN NEBU
2.5000 mg | INHALATION_SOLUTION | Freq: Once | RESPIRATORY_TRACT | Status: AC
  Administered 2013-10-03: 2.5 mg via RESPIRATORY_TRACT
  Filled 2013-10-03: qty 0.5

## 2013-10-03 MED ORDER — MAGNESIUM SULFATE 40 MG/ML IJ SOLN
2.0000 g | Freq: Once | INTRAMUSCULAR | Status: AC
  Administered 2013-10-03: 2 g via INTRAVENOUS
  Filled 2013-10-03: qty 50

## 2013-10-03 MED ORDER — PREDNISONE 20 MG PO TABS: 60.0000 mg | ORAL_TABLET | Freq: Every day | ORAL | Status: DC

## 2013-10-03 MED ORDER — ALBUTEROL SULFATE HFA 108 (90 BASE) MCG/ACT IN AERS: 2.0000 | INHALATION_SPRAY | RESPIRATORY_TRACT | Status: DC | PRN

## 2013-10-03 NOTE — ED Provider Notes (Addendum)
CSN: [id number]     Arrival date & time 10/03/13  1928 History   First MD Initiated Contact with Patient 10/03/13 1936     Chief Complaint  Patient presents with  . Asthma   (Consider location/radiation/quality/duration/timing/severity/associated sxs/prior Treatment) HPI Comments: Patient presents to the ER for evaluation of wheezing and shortness of breath. Patient reports that she has been experiencing sinus infection for the last several days is currently on antibiotic for this. She has been having postnasal drainage and her asthma has worsened. She was seen yesterday in given multiple nebulizer treatments. She says she often needs prednisone when her asthma gets this bad, but it was not prescribed. Recent worsened today. At arrival she was 88% on room air. Patient is not experiencing any fever. She reports that she has used her nebulizer and Symbicort today without improvement.  Patient is a 37 y.o. female presenting with asthma.  Asthma Associated symptoms include shortness of breath.    Past Medical History  Diagnosis Date  . Asthma   . Hypertension   . Chronic anemia   . Abnormal TSH   . Morbid obesity   . GERD (gastroesophageal reflux disease)   . Seasonal allergies   . Nasal polyps   . Diabetes mellitus     type 2  . Chronic bronchitis   . Sleep apnea   . Shortness of breath 12/14/11    "related to how bad my asthma is; sometimes @ rest, lying down, w/exertion"  . Pneumonia   . Blood transfusion   . Inguinal hernia unilateral, non-recurrent     right  . Kidney infection 2008  . Headache(784.0)   . Anxiety   . Depression   . PTSD (post-traumatic stress disorder)     "related to prior hospitalizations; things that happened when I was in hospital"  . Asthma    Past Surgical History  Procedure Laterality Date  . Cervical cerclage  1999; 2002  . Tubal ligation  2002  . Cervical cerclage    . Tubal ligation     Family History  Problem Relation Age of Onset  .  Diabetes Mother   . Heart disease Father    History  Substance Use Topics  . Smoking status: Never Smoker   . Smokeless tobacco: Never Used  . Alcohol Use: No     Comment: 12/14/11 "only on birthdays; parties, and such"   OB History   Grav Para Term Preterm Abortions TAB SAB Ect Mult Living                 Review of Systems  Constitutional: Negative for fatigue.  HENT: Positive for congestion.   Respiratory: Positive for shortness of breath and wheezing.   All other systems reviewed and are negative.    Allergies  Aspirin; Aspirin; Ibuprofen; Shellfish allergy; Menthol; and Other  Home Medications   Current Outpatient Rx  Name  Route  Sig  Dispense  Refill  . albuterol (PROVENTIL HFA;VENTOLIN HFA) 108 (90 BASE) MCG/ACT inhaler   Inhalation   Inhale 2 puffs into the lungs every 6 (six) hours as needed for wheezing or shortness of breath.         . budesonide-formoterol (SYMBICORT) 160-4.5 MCG/ACT inhaler   Inhalation   Inhale 2 puffs into the lungs 2 (two) times daily.         . cephALEXin (KEFLEX) 500 MG capsule   Oral   Take 500 mg by mouth 3 (three) times daily. For 3 days         .  diphenhydrAMINE (BENADRYL) 25 mg capsule   Oral   Take 25 mg by mouth daily as needed for allergies.          . ferrous gluconate (FERGON) 216 MG tablet   Oral   Take 1 tablet (216 mg total) by mouth 3 (three) times daily with meals. Start in 1 week   60 tablet   0   . fluticasone (FLONASE) 50 MCG/ACT nasal spray   Nasal   Place 2 sprays into the nose daily.   16 g   2   . folic acid (FOLVITE) 400 MCG tablet   Oral   Take 400 mcg by mouth every morning.          Marland Kitchen ipratropium-albuterol (DUONEB) 0.5-2.5 (3) MG/3ML SOLN   Nebulization   Take 3 mLs by nebulization every 4 (four) hours as needed (for wheezing or shortness of breath).          . ranitidine (ZANTAC) 150 MG capsule   Oral   Take 150 mg by mouth daily. For heartburn          SpO2 88% Physical  Exam  Constitutional: She is oriented to person, place, and time. She appears well-developed and well-nourished. No distress.  HENT:  Head: Normocephalic and atraumatic.  Right Ear: Hearing normal.  Left Ear: Hearing normal.  Nose: Nose normal.  Mouth/Throat: Oropharynx is clear and moist and mucous membranes are normal.  Eyes: Conjunctivae and EOM are normal. Pupils are equal, round, and reactive to light.  Neck: Normal range of motion. Neck supple.  Cardiovascular: Regular rhythm, S1 normal and S2 normal.  Exam reveals no gallop and no friction rub.   No murmur heard. Pulmonary/Chest: Effort normal. No accessory muscle usage. No respiratory distress. She has wheezes. She has no rhonchi. She has no rales. She exhibits no tenderness.  Abdominal: Soft. Normal appearance and bowel sounds are normal. There is no hepatosplenomegaly. There is no tenderness. There is no rebound, no guarding, no tenderness at McBurney's point and negative Murphy's sign. No hernia.  Musculoskeletal: Normal range of motion.  Neurological: She is alert and oriented to person, place, and time. She has normal strength. No cranial nerve deficit or sensory deficit. Coordination normal. GCS eye subscore is 4. GCS verbal subscore is 5. GCS motor subscore is 6.  Skin: Skin is warm, dry and intact. No rash noted. No cyanosis.  Psychiatric: She has a normal mood and affect. Her speech is normal and behavior is normal. Thought content normal.    ED Course  Procedures (including critical care time) Labs Review Labs Reviewed - No data to display Imaging Review Dg Chest 2 View  10/02/2013   CLINICAL DATA:  Cough  EXAM: CHEST  2 VIEW  COMPARISON:  Chest radiograph 08/24/2013.  FINDINGS: Stable cardiac and mediastinal contours. No consolidative pulmonary opacities. No pleural effusion or pneumothorax. Regional skeleton is unremarkable.  IMPRESSION: No acute cardiopulmonary process.   Electronically Signed   By: Annia Belt M.D.    On: 10/02/2013 18:21    EKG Interpretation   None       MDM  Diagnosis: Asthma exacerbation; Hypokalemia  Patient presents today for evaluation of asthma exacerbation. Patient was seen yesterday for similar situation. She ministry with nebulizers and discharged. Patient reports that her dilators have not been helping today. She indicates that in the past has required prednisone which has similar episodes. Patient brought to the ER by ambulance. She was mildly hypoxic initially, room air oxygen saturation  88%. After nebulizer, oxygen saturation has been 95% or greater. She is given several nebulizers here in the ER. Additionally she was given Solu-Medrol and magnesium and observe for a period of time. Records from yesterday were reviewed. Chest x-ray did not show pneumonia, no concern for pneumonia today. As her oxygen saturation is currently normal on room air, she is appropriate for discharge. Will be prescribed prednisone and zithromax.  Addendum: After discharge instructions given to the patient, she attempted to ambulate to the exit and became short of breath. Patient reported being weak, dizzy and feeling like she was going to pass out. Re-evaluation revealed mild wheezing, increased work of breathing. Some of the wheezing seemed to be forced expirations are or wheezing, but as the patient inability to ambulate down the hallway and did have some mild desaturations, would require hospitalization.  Gilda Crease, MD 10/03/13 1610  Gilda Crease, MD 10/06/13 [phone]

## 2013-10-03 NOTE — ED Notes (Signed)
Pt transported from home by EMS with c/o asthma exacerbation. Pt used neb at home with no relief pt seen yesterday for same. Pt rcvd duoneb enroute to ED. Reports some relief. Pt was 88% on RA per EMS. A & O.

## 2013-10-03 NOTE — ED Notes (Signed)
Pt presents w/ c/o SOB, seen yesterday for the same.  Pt reports that she has used her inhaler x 7 today.  Last admission in September for known hx of asthma.

## 2013-10-03 NOTE — ED Notes (Signed)
Bed: RESA Expected date: 10/03/13 Expected time: 7:21 PM Means of arrival: Ambulance Comments: Asthma

## 2013-10-03 NOTE — ED Notes (Signed)
Amy from respiratory notified of nebs and will be here shortly to administer.

## 2013-10-03 NOTE — ED Notes (Signed)
Pt noted to be on laptop, cell phone w/o O2 in place.

## 2013-10-04 ENCOUNTER — Encounter (HOSPITAL_COMMUNITY): Payer: Self-pay | Admitting: Orthopedic Surgery

## 2013-10-04 DIAGNOSIS — J329 Chronic sinusitis, unspecified: Secondary | ICD-10-CM

## 2013-10-04 DIAGNOSIS — D638 Anemia in other chronic diseases classified elsewhere: Secondary | ICD-10-CM

## 2013-10-04 DIAGNOSIS — K219 Gastro-esophageal reflux disease without esophagitis: Secondary | ICD-10-CM

## 2013-10-04 DIAGNOSIS — J45901 Unspecified asthma with (acute) exacerbation: Principal | ICD-10-CM

## 2013-10-04 DIAGNOSIS — J9600 Acute respiratory failure, unspecified whether with hypoxia or hypercapnia: Secondary | ICD-10-CM

## 2013-10-04 LAB — COMPREHENSIVE METABOLIC PANEL
ALT: 13 U/L (ref 0–35)
Albumin: 3.6 g/dL (ref 3.5–5.2)
Alkaline Phosphatase: 85 U/L (ref 39–117)
BUN: 11 mg/dL (ref 6–23)
Calcium: 9 mg/dL (ref 8.4–10.5)
Potassium: 4 mEq/L (ref 3.5–5.1)
Sodium: 136 mEq/L (ref 135–145)
Total Protein: 7 g/dL (ref 6.0–8.3)

## 2013-10-04 LAB — BLOOD GAS, ARTERIAL
Acid-base deficit: 4.1 mmol/L — ABNORMAL HIGH (ref 0.0–2.0)
Bicarbonate: 19 mEq/L — ABNORMAL LOW (ref 20.0–24.0)
Drawn by: 11249
TCO2: 17.8 mmol/L (ref 0–100)
pCO2 arterial: 28.7 mmHg — ABNORMAL LOW (ref 35.0–45.0)
pH, Arterial: 7.435 (ref 7.350–7.450)
pO2, Arterial: 64 mmHg — ABNORMAL LOW (ref 80.0–100.0)

## 2013-10-04 LAB — POCT PREGNANCY, URINE: Preg Test, Ur: NEGATIVE

## 2013-10-04 LAB — GLUCOSE, CAPILLARY
Glucose-Capillary: 271 mg/dL — ABNORMAL HIGH (ref 70–99)
Glucose-Capillary: 229 mg/dL — ABNORMAL HIGH (ref 70–99)
Glucose-Capillary: 314 mg/dL — ABNORMAL HIGH (ref 70–99)

## 2013-10-04 LAB — CBC
MCH: 20.2 pg — ABNORMAL LOW (ref 26.0–34.0)
MCHC: 29.5 g/dL — ABNORMAL LOW (ref 30.0–36.0)
RDW: 22.2 % — ABNORMAL HIGH (ref 11.5–15.5)

## 2013-10-04 MED ORDER — ALUM & MAG HYDROXIDE-SIMETH 200-200-20 MG/5ML PO SUSP: 30.0000 mL | Freq: Four times a day (QID) | ORAL | Status: DC | PRN

## 2013-10-04 MED ORDER — POTASSIUM CHLORIDE CRYS ER 20 MEQ PO TBCR
40.0000 meq | EXTENDED_RELEASE_TABLET | Freq: Once | ORAL | Status: AC
  Administered 2013-10-04: 40 meq via ORAL
  Filled 2013-10-04: qty 2

## 2013-10-04 MED ORDER — METHYLPREDNISOLONE SODIUM SUCC 125 MG IJ SOLR
80.0000 mg | Freq: Three times a day (TID) | INTRAMUSCULAR | Status: DC
  Administered 2013-10-04 (×2): 80 mg via INTRAVENOUS
  Filled 2013-10-04 (×4): qty 1.28

## 2013-10-04 MED ORDER — ALBUTEROL SULFATE (5 MG/ML) 0.5% IN NEBU: 2.5000 mg | INHALATION_SOLUTION | RESPIRATORY_TRACT | Status: DC | PRN

## 2013-10-04 MED ORDER — ENOXAPARIN SODIUM 40 MG/0.4ML ~~LOC~~ SOLN: 40.0000 mg | SUBCUTANEOUS | Status: DC

## 2013-10-04 MED ORDER — SODIUM CHLORIDE 0.9 % IV SOLN
INTRAVENOUS | Status: AC
  Administered 2013-10-04: 02:00:00 via INTRAVENOUS

## 2013-10-04 MED ORDER — GUAIFENESIN ER 600 MG PO TB12
600.0000 mg | ORAL_TABLET | Freq: Two times a day (BID) | ORAL | Status: DC
  Administered 2013-10-04 – 2013-10-06 (×6): 600 mg via ORAL
  Filled 2013-10-04 (×7): qty 1

## 2013-10-04 MED ORDER — FAMOTIDINE 20 MG PO TABS
20.0000 mg | ORAL_TABLET | Freq: Every day | ORAL | Status: DC
  Administered 2013-10-04 – 2013-10-06 (×3): 20 mg via ORAL
  Filled 2013-10-04 (×3): qty 1

## 2013-10-04 MED ORDER — ALBUTEROL SULFATE (5 MG/ML) 0.5% IN NEBU
2.5000 mg | INHALATION_SOLUTION | Freq: Four times a day (QID) | RESPIRATORY_TRACT | Status: DC
  Administered 2013-10-04 – 2013-10-06 (×10): 2.5 mg via RESPIRATORY_TRACT
  Filled 2013-10-04 (×10): qty 0.5

## 2013-10-04 MED ORDER — FLUTICASONE PROPIONATE 50 MCG/ACT NA SUSP
2.0000 | Freq: Every day | NASAL | Status: DC
  Administered 2013-10-04 – 2013-10-06 (×3): 2 via NASAL
  Filled 2013-10-04 (×2): qty 16

## 2013-10-04 MED ORDER — INSULIN ASPART 100 UNIT/ML ~~LOC~~ SOLN
0.0000 [IU] | Freq: Three times a day (TID) | SUBCUTANEOUS | Status: DC
  Administered 2013-10-04: 14:00:00 5 [IU] via SUBCUTANEOUS
  Administered 2013-10-04: 18:00:00 3 [IU] via SUBCUTANEOUS
  Administered 2013-10-04: 09:00:00 5 [IU] via SUBCUTANEOUS
  Administered 2013-10-05: 08:00:00 2 [IU] via SUBCUTANEOUS
  Administered 2013-10-05: 6 [IU] via SUBCUTANEOUS
  Administered 2013-10-05: 3 [IU] via SUBCUTANEOUS
  Administered 2013-10-06: 09:00:00 1 [IU] via SUBCUTANEOUS

## 2013-10-04 MED ORDER — FOLIC ACID 0.5 MG HALF TAB
500.0000 ug | ORAL_TABLET | Freq: Every morning | ORAL | Status: DC
  Administered 2013-10-04 – 2013-10-06 (×3): 0.5 mg via ORAL
  Filled 2013-10-04 (×3): qty 1

## 2013-10-04 MED ORDER — ACETAMINOPHEN 650 MG RE SUPP: 650.0000 mg | Freq: Four times a day (QID) | RECTAL | Status: DC | PRN

## 2013-10-04 MED ORDER — LORATADINE 10 MG PO TABS
10.0000 mg | ORAL_TABLET | Freq: Every day | ORAL | Status: DC
  Administered 2013-10-04 – 2013-10-06 (×3): 10 mg via ORAL
  Filled 2013-10-04 (×3): qty 1

## 2013-10-04 MED ORDER — ONDANSETRON HCL 4 MG PO TABS: 4.0000 mg | ORAL_TABLET | Freq: Four times a day (QID) | ORAL | Status: DC | PRN

## 2013-10-04 MED ORDER — AMLODIPINE BESYLATE 5 MG PO TABS
5.0000 mg | ORAL_TABLET | Freq: Every day | ORAL | Status: DC
  Administered 2013-10-05 – 2013-10-06 (×2): 5 mg via ORAL
  Filled 2013-10-04 (×2): qty 1

## 2013-10-04 MED ORDER — ENOXAPARIN SODIUM 60 MG/0.6ML ~~LOC~~ SOLN
60.0000 mg | Freq: Every day | SUBCUTANEOUS | Status: DC
  Administered 2013-10-04 – 2013-10-06 (×3): 60 mg via SUBCUTANEOUS
  Filled 2013-10-04 (×3): qty 0.6

## 2013-10-04 MED ORDER — ONDANSETRON HCL 4 MG/2ML IJ SOLN: 4.0000 mg | Freq: Four times a day (QID) | INTRAMUSCULAR | Status: DC | PRN

## 2013-10-04 MED ORDER — BUDESONIDE 0.25 MG/2ML IN SUSP
0.2500 mg | Freq: Two times a day (BID) | RESPIRATORY_TRACT | Status: DC
  Administered 2013-10-04 – 2013-10-06 (×5): 0.25 mg via RESPIRATORY_TRACT
  Filled 2013-10-04 (×11): qty 2

## 2013-10-04 MED ORDER — ACETAMINOPHEN 325 MG PO TABS: 650.0000 mg | ORAL_TABLET | Freq: Four times a day (QID) | ORAL | Status: DC | PRN

## 2013-10-04 MED ORDER — OXYMETAZOLINE HCL 0.05 % NA SOLN
1.0000 | Freq: Two times a day (BID) | NASAL | Status: AC
  Administered 2013-10-04 – 2013-10-06 (×6): 1 via NASAL
  Filled 2013-10-04: qty 15

## 2013-10-04 MED ORDER — IPRATROPIUM BROMIDE 0.02 % IN SOLN
0.5000 mg | Freq: Four times a day (QID) | RESPIRATORY_TRACT | Status: DC
  Administered 2013-10-04 – 2013-10-06 (×10): 0.5 mg via RESPIRATORY_TRACT
  Filled 2013-10-04 (×10): qty 2.5

## 2013-10-04 MED ORDER — FERROUS GLUCONATE 324 (38 FE) MG PO TABS
324.0000 mg | ORAL_TABLET | Freq: Three times a day (TID) | ORAL | Status: DC
  Administered 2013-10-04 – 2013-10-06 (×7): 324 mg via ORAL
  Filled 2013-10-04 (×10): qty 1

## 2013-10-04 MED ORDER — PREDNISONE 50 MG PO TABS
60.0000 mg | ORAL_TABLET | Freq: Every day | ORAL | Status: DC
  Administered 2013-10-05 – 2013-10-06 (×2): 60 mg via ORAL
  Filled 2013-10-04 (×3): qty 1

## 2013-10-04 MED ORDER — INSULIN ASPART 100 UNIT/ML ~~LOC~~ SOLN
3.0000 [IU] | Freq: Three times a day (TID) | SUBCUTANEOUS | Status: DC
  Administered 2013-10-05 – 2013-10-06 (×4): 3 [IU] via SUBCUTANEOUS

## 2013-10-04 MED ORDER — INSULIN ASPART 100 UNIT/ML ~~LOC~~ SOLN
8.0000 [IU] | Freq: Once | SUBCUTANEOUS | Status: AC
  Administered 2013-10-04: 8 [IU] via SUBCUTANEOUS

## 2013-10-04 MED ORDER — LEVOFLOXACIN IN D5W 750 MG/150ML IV SOLN
750.0000 mg | Freq: Every day | INTRAVENOUS | Status: DC
  Administered 2013-10-04: 02:00:00 750 mg via INTRAVENOUS
  Filled 2013-10-04 (×2): qty 150

## 2013-10-04 NOTE — Progress Notes (Signed)
Utilization review completed.  

## 2013-10-04 NOTE — ED Notes (Signed)
Report given to Darl Pikes, RN on 6 east.  Informed Darl Pikes that pt had been d/c so her IV had been removed and not reinserted.

## 2013-10-04 NOTE — ED Notes (Signed)
Pt brought back to room by Amy from respiratory d/t wheezing.  Pt is 96% on RA at this time.

## 2013-10-04 NOTE — Clinical Documentation Improvement (Signed)
Possible Clinical Conditions? Acute Respiratory Failure Acute on Chronic Respiratory Failure Chronic Respiratory Failure Other Condition Cannot Clinically Determine   Supporting Information: Risk Factors:(As per ED notes)Asthma Exacerbation Signs & Symptoms:(As per ED notes)"She was mildly hypoxic initially, room air oxygen saturation 88%. After nebulizer, oxygen saturation has been 95% or greater. She is given several nebulizers here in the ER.  Additionally she was given Solu-Medrol and magnesium and observe for a period of time. "  Diagnostics: CXR =10-02-13 IMPRESSION:  No acute cardiopulmonary process.  Treatment: albuterol (PROVENTIL) (5 MG/ML) 0.5% nebulizer solution 2.5 mg   [[id number]]    Ordered Dose: 2.5 mg Route: Nebulization Frequency: Every 2 hours PRN for wheezing   budesonide (PULMICORT) nebulizer solution 0.25 mg   [[id number]]    Ordered Dose: 0.25 mg Route: Nebulization Frequency: 2 times daily   ipratropium (ATROVENT) nebulizer solution 0.5 mg   [[id number]]    Ordered Dose: 0.5 mg Route: Nebulization Frequency: Every 6 hours   predniSONE (DELTASONE) 20 MG tablet [[id number]]   Order Details    Dose: 60 mg Route: Oral Frequency: Daily with breakfast     albuterol (PROVENTIL HFA;VENTOLIN HFA) 108 (90 BASE) MCG/ACT inhaler [[id number]]   Order Details    Dose: 2 puff Route: Inhalation Frequency: Every 6 hours PRN       Thank You, Nevin Bloodgood, RN, BSN, CCDS Clinical Documentation Specialist:  [phone]  [facility]- Health Information Management [phone]

## 2013-10-04 NOTE — Progress Notes (Addendum)
[facility] PROGRESS NOTE  [name] [name] WUJ:[id number] DOB: 20-Jul-1976 DOA: 10/03/2013 PCP: No Pcp  Assessment/Plan  Acute hypoxic respiratory failure due to acute asthma exacerbation   - Continue IV Solu-Medrol today -  Continue budesonide -  Continue nebulized bronchodilators -  No indication for empiric antibiotics for asthma exacerbation and no PNA on CXR so d/c Levaquin -  Patient compliant with symbicort and duonebs at home and still having exacerbations.  Will touch base with pulmonology in AM regarding controller medications and follow up.    . Chronic anemia likely due to iron deficiency from menorrhagia, -  Continue iron supplementation  -  Patient not on OCP, but recommend outpatient follow up to help decrease menorrhagia  . GERD (gastroesophageal reflux disease) stable.  Continue H2 blocker  . Hypokalemia resolved with oral supplementation   . Sinusitis, chronic  -  Continue [place] course of antihistamines, nasal decongestant spray for 3 days, and Flonase.  T2DM, hyperglycemic due to steroids -  Diabetic diet -  Taper steroids  -  Continue low dose SSI and add 3 units aspart with meals  Diet:  Diabetic  Access:  PIV IVF:  OFF Proph:  lovenox  Code Status: full Family Communication: patient alone Disposition Plan: pending improvement in SOB   Consultants:  none  Procedures:  CXR  Antibiotics:  Levofloxacin 11/6    HPI/Subjective:  Breathing is already improving.  Still SOB at rest, but was able to eat some breakfast.      Objective: Filed Vitals:   10/04/13 0509 10/04/13 1005 10/04/13 1554 10/04/13 1600  BP: 159/95  110/65   Pulse: 104  106   Temp: 97.5 F (36.4 C)  98.3 F (36.8 C)   TempSrc: Oral  Oral   Resp: 18  16   Height:      Weight:      SpO2: 100% 99% 100% 99%    Intake/Output Summary (Last 24 hours) at 10/04/13 1741 Last data filed at 10/04/13 1500  Gross per 24 hour  Intake 761.25 ml  Output   1001 ml  Net  -239.75 ml   Filed Weights   10/04/13 0141  Weight: 135.172 kg (298 lb)    Exam:   General:  Obese AAF, SCM, suprasternal, supraclavicular rtx with exertion  HEENT:  NCAT, MMM  Cardiovascular:  RRR, nl S1, S2 no mrg, 2+ pulses, warm extremities  Respiratory:  Rhonchorous, full exp wheeze, no focal rales  Abdomen:   NABS, soft, NT/ND  MSK:   Normal tone and bulk, no LEE  Neuro:  Grossly intact  Data Reviewed: Basic Metabolic Panel:  Recent Labs Lab 10/02/13 1800 10/03/13 1945 10/04/13 0420  NA 138 142 136  K 2.9* 3.1* 4.0  CL 105 107 103  CO2 21 22 18*  GLUCOSE 110* 163* 315*  BUN 9 9 11   CREATININE 0.77 0.67 0.75  CALCIUM 9.2 9.2 9.0   Liver Function Tests:  Recent Labs Lab 10/04/13 0420  AST 15  ALT 13  ALKPHOS 85  BILITOT 0.2*  PROT 7.0  ALBUMIN 3.6   No results found for this basename: LIPASE, AMYLASE,  in the last 168 hours No results found for this basename: AMMONIA,  in the last 168 hours CBC:  Recent Labs Lab 10/02/13 1800 10/03/13 1945 10/04/13 0420  WBC 8.6 11.2* 8.6  NEUTROABS 3.6 7.2  --   HGB 9.0* 8.7* 8.4*  HCT 29.9* 29.1* 28.5*  MCV 68.3* 68.6* 68.7*  PLT 351 433* 384  Cardiac Enzymes: No results found for this basename: CKTOTAL, CKMB, CKMBINDEX, TROPONINI,  in the last 168 hours BNP (last 3 results)  Recent Labs  05/28/13 1821  PROBNP 56.7   CBG:  Recent Labs Lab 10/04/13 0732 10/04/13 1325 10/04/13 1703  GLUCAP 226* 271* 229*    No results found for this or any previous visit (from the past 240 hour(s)).   Studies: Dg Chest 2 View  10/02/2013   CLINICAL DATA:  Cough  EXAM: CHEST  2 VIEW  COMPARISON:  Chest radiograph 08/24/2013.  FINDINGS: Stable cardiac and mediastinal contours. No consolidative pulmonary opacities. No pleural effusion or pneumothorax. Regional skeleton is unremarkable.  IMPRESSION: No acute cardiopulmonary process.   Electronically Signed   By: Annia Belt M.D.   On: 10/02/2013 18:21     Scheduled Meds: . albuterol  2.5 mg Nebulization Q6H  . [START ON 10/05/2013] amLODipine  5 mg Oral Daily  . budesonide (PULMICORT) nebulizer solution  0.25 mg Nebulization BID  . enoxaparin (LOVENOX) injection  60 mg Subcutaneous Daily  . famotidine  20 mg Oral Daily  . ferrous gluconate  324 mg Oral TID WC  . fluticasone  2 spray Each Nare Daily  . folic acid  500 mcg Oral q morning - 10a  . guaiFENesin  600 mg Oral BID  . insulin aspart  0-9 Units Subcutaneous TID WC  . ipratropium  0.5 mg Nebulization Q6H  . levofloxacin (LEVAQUIN) IV  750 mg Intravenous QHS  . loratadine  10 mg Oral Daily  . methylPREDNISolone (SOLU-MEDROL) injection  80 mg Intravenous Q8H  . oxymetazoline  1 spray Each Nare BID   Continuous Infusions:   Active Problems:   GERD (gastroesophageal reflux disease)   Chronic anemia   Acute asthma exacerbation   Hypokalemia   Sinusitis, chronic    Time spent: 30 min    Yukari Flax, [facility]  [facility] Pager [phone]. If 7PM-7AM, please contact night-coverage at [email or web address], password [facility] 10/04/2013, 5:41 PM  LOS: 1 day

## 2013-10-04 NOTE — Progress Notes (Signed)
Inpatient Diabetes Program Recommendations  AACE/ADA: New Consensus Statement on Inpatient Glycemic Control (2013)  Target Ranges:  Prepandial:   less than 140 mg/dL      Peak postprandial:   less than 180 mg/dL (1-2 hours)      Critically ill patients:  140 - 180 mg/dL   Reason for Visit: Hyprglycemia Results for KATELEE, SCHUPP (MRN [id number]) as of 10/04/2013 10:21  Ref. Range 07/24/2013 07:47  Glucose-Capillary Latest Range: 70-99 mg/dL 621 (H)  Results for IEISHA, GAO (MRN [id number]) as of 10/04/2013 10:21  Ref. Range 10/03/2013 19:45 10/04/2013 04:20  Sodium Latest Range: 135-145 mEq/L 142 136  Potassium Latest Range: 3.5-5.1 mEq/L 3.1 (L) 4.0  Chloride Latest Range: 96-112 mEq/L 107 103  CO2 Latest Range: 19-32 mEq/L 22 18 (L)  BUN Latest Range: 6-23 mg/dL 9 11  Creatinine Latest Range: 0.50-1.10 mg/dL 9.62 9.52  Calcium Latest Range: 8.4-10.5 mg/dL 9.2 9.0  GFR calc non Af Amer Latest Range: >90 mL/min >90 >90  GFR calc Af Amer Latest Range: >90 mL/min >90 >90  Glucose Latest Range: 70-99 mg/dL 841 (H) 324 (H)     Inpatient Diabetes Program Recommendations Correction (SSI): Please order CBGs with moderate correction while inpatient and on steroids   Note: Patient has a history of diabetes but is not on any medication for diabetes control as an outpatient. Initial lab glucose noted to be 163 mg/dl and fasting lab glucose this morning ws 315 mg/dl. Patient is ordered to receive Solumedrol 80mg  IV TID which is likely contributing to hyperglycemia. Please order CBGs with Novolog moderate correction scale ACHS while inpatient and on steroids. Will continue to follow.   Thank you. Ailene Ards, RD, LDN, CDE Inpatient Diabetes Coordinator [phone]

## 2013-10-04 NOTE — H&P (Signed)
PATIENT DETAILS Name: Kiyanna Biegler Age: 37 y.o. Sex: female Date of Birth: 22-May-1976 Admit Date: 10/03/2013 PCP:No Pcp   CHIEF COMPLAINT:  Shortness of breath for the past 3-4 days  HPI: Terianne Thaker is a 37 y.o. female with a Past Medical History of bronchial asthma, chronic sinusitis, anemia who presents today with the above noted complaint. Per patient, approximately 3-4 days ago patient started developing shortness of breath, along with cough and hoarseness of her voice. She claims the cough is mostly productive of yellowish phlegm. She also claims that she has chronic sinusitis and thinks she has a postnasal drip. She started using her nebulized bronchodilators at home, she still did not feel better and came to the emergency room yesterday, where she was treated with nebulized bronchodilators, Solu-Medrol and sent home. Unfortunately she did not feel any better, presented back to the emergency room with persistent symptoms. The ED physician treated her with Solu-Medrol, IV magnesium and nebulized bronchodilators, attempted to discharge her however she was persistently short of breath. I was then asked to admit this patient for further evaluation and treatment. During my evaluation, patient seemed comfortable, who to saturation was in the high to mid 90s on just 2 L of oxygen. She was breathing comfortably and was not in any distress. She denied any nausea, vomiting or abdominal pain. She denied any chest pain.   ALLERGIES:   Allergies  Allergen Reactions  . Aspirin Anaphylaxis  . Aspirin Anaphylaxis  . Ibuprofen Anaphylaxis  . Shellfish Allergy Hives and Swelling    Facial and oral swelling  . Menthol Swelling    Tongue swelling  . Other Other (See Comments)    Plastic thermometer covers cause sloughing off of skin on tongue and inside mouth    PAST MEDICAL HISTORY: Past Medical History  Diagnosis Date  . Asthma   . Hypertension   . Chronic anemia   . Abnormal TSH    . Morbid obesity   . GERD (gastroesophageal reflux disease)   . Seasonal allergies   . Nasal polyps   . Diabetes mellitus     type 2  . Chronic bronchitis   . Sleep apnea   . Shortness of breath 12/14/11    "related to how bad my asthma is; sometimes @ rest, lying down, w/exertion"  . Pneumonia   . Blood transfusion   . Inguinal hernia unilateral, non-recurrent     right  . Kidney infection 2008  . Headache(784.0)   . Anxiety   . Depression   . PTSD (post-traumatic stress disorder)     "related to prior hospitalizations; things that happened when I was in hospital"  . Asthma     PAST SURGICAL HISTORY: Past Surgical History  Procedure Laterality Date  . Cervical cerclage  1999; 2002  . Tubal ligation  2002  . Cervical cerclage    . Tubal ligation      MEDICATIONS AT HOME: Prior to Admission medications   Medication Sig Start Date End Date Taking? Authorizing Provider  albuterol (PROVENTIL HFA;VENTOLIN HFA) 108 (90 BASE) MCG/ACT inhaler Inhale 2 puffs into the lungs every 6 (six) hours as needed for wheezing or shortness of breath.   Yes Historical Provider, MD  budesonide-formoterol (SYMBICORT) 160-4.5 MCG/ACT inhaler Inhale 2 puffs into the lungs 2 (two) times daily.   Yes Historical Provider, MD  cephALEXin (KEFLEX) 500 MG capsule Take 500 mg by mouth 3 (three) times daily. For 3 days 10/01/13  Yes Zannie Cove, MD  diphenhydrAMINE (BENADRYL)  25 mg capsule Take 25 mg by mouth daily as needed for allergies.    Yes Historical Provider, MD  ferrous gluconate (FERGON) 216 MG tablet Take 1 tablet (216 mg total) by mouth 3 (three) times daily with meals. Start in 1 week 08/26/13  Yes Zannie Cove, MD  fluticasone [facility]) 50 MCG/ACT nasal spray Place 2 sprays into the nose daily. 05/30/13  Yes Henderson Cloud, MD  folic acid (FOLVITE) 400 MCG tablet Take 400 mcg by mouth every morning.    Yes Historical Provider, MD  ipratropium-albuterol (DUONEB) 0.5-2.5 (3) MG/3ML  SOLN Take 3 mLs by nebulization every 4 (four) hours as needed (for wheezing or shortness of breath).  09/01/12  Yes Heather Laisure, PA-C  ranitidine (ZANTAC) 150 MG capsule Take 150 mg by mouth daily. For heartburn   Yes Historical Provider, MD  albuterol (PROVENTIL HFA;VENTOLIN HFA) 108 (90 BASE) MCG/ACT inhaler Inhale 2 puffs into the lungs every 4 (four) hours as needed for wheezing or shortness of breath. 10/03/13   Gilda Crease, MD  albuterol (PROVENTIL) (2.5 MG/3ML) 0.083% nebulizer solution Take 3 mLs (2.5 mg total) by nebulization every 4 (four) hours as needed for wheezing or shortness of breath. 10/03/13   Gilda Crease, MD  azithromycin (ZITHROMAX) 250 MG tablet Take 1 tablet (250 mg total) by mouth daily. Take first 2 tablets together, then 1 every day until finished. 10/03/13   Gilda Crease, MD  predniSONE (DELTASONE) 20 MG tablet Take 3 tablets (60 mg total) by mouth daily with breakfast. 10/03/13   Gilda Crease, MD    FAMILY HISTORY: Family History  Problem Relation Age of Onset  . Diabetes Mother   . Heart disease Father     SOCIAL HISTORY:  reports that she has never smoked. She has never used smokeless tobacco. She reports that she does not drink alcohol or use illicit drugs.  REVIEW OF SYSTEMS:  Constitutional:   No  weight loss, night sweats,  Fevers, chills, fatigue.  HEENT:    No headaches, Difficulty swallowing,Tooth/dental problems,Sore throat,  No sneezing, itching, ear ache   Cardio-vascular: No chest pain,  Orthopnea, PND, swelling in lower extremities, anasarca,  dizziness, palpitations  GI:  No heartburn, indigestion, abdominal pain, nausea, vomiting, diarrhea, change in bowel habits, loss of appetite  Resp:   No excess mucus, no productive cough,,  No coughing up of blood.No change in color of mucus.No chest wall deformity  Skin:  no rash or lesions.  GU:  no dysuria, change in color of urine, no urgency or  frequency.  No flank pain.  Musculoskeletal: No joint pain or swelling.  No decreased range of motion.  No back pain.  Psych: No change in mood or affect. No depression or anxiety.  No memory loss.   PHYSICAL EXAM: Blood pressure 156/96, pulse 98, temperature 98.8 F (37.1 C), temperature source Axillary, resp. rate 18, last menstrual period 08/27/2013, SpO2 96.00%.  General appearance :Awake, alert, not in any distress. Speech Clear. Not toxic Looking. Coarse voice. HEENT: Atraumatic and Normocephalic, pupils equally reactive to light and accomodation Neck: supple, no JVD. No cervical lymphadenopathy.  Chest:Good air entry bilaterally, coarse rhonchi heard in all lung zones  CVS: S1 S2 regular, no murmurs.  Abdomen: Bowel sounds present, Non tender and not distended with no gaurding, rigidity or rebound. Extremities: B/L Lower Ext shows no edema, both legs are warm to touch Neurology: Awake alert, and oriented X 3, CN II-XII intact, Non focal  Skin:No Rash Wounds:N/A  LABS ON ADMISSION:   Recent Labs  10/02/13 1800 10/03/13 1945  NA 138 142  K 2.9* 3.1*  CL 105 107  CO2 21 22  GLUCOSE 110* 163*  BUN 9 9  CREATININE 0.77 0.67  CALCIUM 9.2 9.2   No results found for this basename: AST, ALT, ALKPHOS, BILITOT, PROT, ALBUMIN,  in the last 72 hours No results found for this basename: LIPASE, AMYLASE,  in the last 72 hours  Recent Labs  10/02/13 1800 10/03/13 1945  WBC 8.6 11.2*  NEUTROABS 3.6 7.2  HGB 9.0* 8.7*  HCT 29.9* 29.1*  MCV 68.3* 68.6*  PLT 351 433*   No results found for this basename: CKTOTAL, CKMB, CKMBINDEX, TROPONINI,  in the last 72 hours No results found for this basename: DDIMER,  in the last 72 hours No components found with this basename: POCBNP,    RADIOLOGIC STUDIES ON ADMISSION: Dg Chest 2 View  10/02/2013   CLINICAL DATA:  Cough  EXAM: CHEST  2 VIEW  COMPARISON:  Chest radiograph 08/24/2013.  FINDINGS: Stable cardiac and mediastinal  contours. No consolidative pulmonary opacities. No pleural effusion or pneumothorax. Regional skeleton is unremarkable.  IMPRESSION: No acute cardiopulmonary process.   Electronically Signed   By: Annia Belt M.D.   On: 10/02/2013 18:21    ASSESSMENT AND PLAN: Present on Admission:  . Acute asthma exacerbation - Since symptoms are persistent, will admit to a medical surgical unit. Will start her on scheduled IV Solu-Medrol, nebulized bronchodilators and empiric Levaquin. We'll monitor closely and follow clinical course and adjust treatment accordingly.   . Chronic anemia - Patient gives a history of menorrhagia, suspect anemia secondary to this. We'll continue on iron supplementation   . GERD (gastroesophageal reflux disease) - Will continue with Axid   . Hypokalemia - Will replete and recheck in a.m.   . Sinusitis, chronic - Suspect she may have a postnasal drip causing worsening of her respiratory symptoms- will start on antihistamines, nasal decongestant spray for 3 days, continue with Flonase.   Further plan will depend as patient's clinical course evolves and further radiologic and laboratory data become available. Patient will be monitored closely.   DVT Prophylaxis: Prophylactic Lovenox   Code Status: Full Code  Total time spent for admission equals 45 minutes.  [facility] Pager [phone]  If 7PM-7AM, please contact night-coverage [email or web address] Password [facility] 10/04/2013, 12:27 AM

## 2013-10-05 LAB — BASIC METABOLIC PANEL
BUN: 12 mg/dL (ref 6–23)
CO2: 22 mEq/L (ref 19–32)
Calcium: 9.6 mg/dL (ref 8.4–10.5)
Creatinine, Ser: 0.75 mg/dL (ref 0.50–1.10)
GFR calc Af Amer: >90 mL/min (ref >90)
GFR calc non Af Amer: >90 mL/min (ref >90)
Glucose, Bld: 243 mg/dL — ABNORMAL HIGH (ref 70–99)
Potassium: 4 mEq/L (ref 3.5–5.1)
Sodium: 135 mEq/L (ref 135–145)

## 2013-10-05 LAB — GLUCOSE, CAPILLARY
Glucose-Capillary: 209 mg/dL — ABNORMAL HIGH (ref 70–99)
Glucose-Capillary: 178 mg/dL — ABNORMAL HIGH (ref 70–99)

## 2013-10-05 LAB — MRSA PCR SCREENING: MRSA by PCR: POSITIVE — AB

## 2013-10-05 NOTE — Progress Notes (Signed)
[facility] PROGRESS NOTE  Dyan Labarbera VHQ:[id number] DOB: 1976/02/14 DOA: 10/03/2013 PCP: No Pcp  Brief narrative: 37 year-old female with past medical history significant for bronchial asthma, sinusitis, anemia who presented to [facility] ED 10/03/2013 with worsening shortness of breath associated with productive cough of yellow sputum. Patient reported her symptoms started 3-4 days prior to this admission. Patient used bronchodilator nebulizers at home but this did not provide significant symptomatic relief. Patient was seen in emergency room one day prior to this admission and after nebulizer treatments and Solu-Medrol was subsequently discharged home. Because her symptoms did not improve she ultimately came back for further evaluation and treatment. In ED, initial vitals were stable. The patient received 3 nebulizer treatments and IV Solu-Medrol but had persistent shortness of breath and has required the admission. Chest x-ray on admission did not show acute cardiopulmonary process.  Assessment/Plan   Principal problem: Acute hypoxic respiratory failure due to acute asthma exacerbation  - Continue albuterol and Atrovent every 6 hours scheduled; may use albuterol every 2 hours as needed for shortness of breath or wheezing - Continue budesonide twice daily nebulizer treatment - Continue prednisone 60 mg daily - Patient was initially started on Levaquin for possible pneumonia. Chest x-ray did not reveal acute cardiopulmonary process so Levaquin has been discontinued - Continue Mucinex 600 mg by mouth twice a day - Please note that patient is on sliding scale insulin for hyperglycemia induced by steroids  Active problems:  Anemia of chronic disease  - Secondary to chronic iron deficiency with menorrhagia  - Continue ferrous sulfate supplementation  - Continue folic acid   GERD - Continue Pepcid 20 mg daily  Hypokalemia  - resolved with oral supplementation   Sinusitis, chronic  -  Continue Flonase daily  Steroid-induced hyperglycemia - CBGs in past 24 hours are 229, 314 and 189 - Diabetic coordinator is following - Continue sliding scale insulin  Hypertension - Continue Norvasc 5 mg daily   Code Status: full  Family Communication: No family at the bedside Disposition Plan: Discharge home likely in the next 24 hours  Consultants:  none Procedures:  CXR Antibiotics:  Levofloxacin 11/6    HPI/Subjective: No events overnight.   Objective: Filed Vitals:   10/04/13 2205 10/05/13 0224 10/05/13 0520 10/05/13 0754  BP: 145/92  145/91   Pulse: 105  82   Temp: 98.1 F (36.7 C)  98 F (36.7 C)   TempSrc: Oral  Oral   Resp: 20  16   Height:      Weight:   134.8 kg (297 lb 2.9 oz)   SpO2: 97% 100% 100% 95%    Intake/Output Summary (Last 24 hours) at 10/05/13 0919 Last data filed at 10/05/13 0837  Gross per 24 hour  Intake   1190 ml  Output   1650 ml  Net   -460 ml    Exam:   General:  Pt is alert, follows commands appropriately, not in acute distress  Cardiovascular: Regular rate and rhythm, S1/S2, no murmurs, no rubs, no gallops  Respiratory: slight wheezing in upper lobes, no crackles  Abdomen: Soft, non tender, non distended, bowel sounds present, no guarding  Extremities: No edema, pulses DP and PT palpable bilaterally  Neuro: Grossly nonfocal  Data Reviewed: Basic Metabolic Panel:  Recent Labs Lab 10/02/13 1800 10/03/13 1945 10/04/13 0420  NA 138 142 136  K 2.9* 3.1* 4.0  CL 105 107 103  CO2 21 22 18*  GLUCOSE 110* 163* 315*  BUN 9 9  11  CREATININE 0.77 0.67 0.75  CALCIUM 9.2 9.2 9.0   Liver Function Tests:  Recent Labs Lab 10/04/13 0420  AST 15  ALT 13  ALKPHOS 85  BILITOT 0.2*  PROT 7.0  ALBUMIN 3.6   No results found for this basename: LIPASE, AMYLASE,  in the last 168 hours No results found for this basename: AMMONIA,  in the last 168 hours CBC:  Recent Labs Lab 10/02/13 1800 10/03/13 1945  10/04/13 0420  WBC 8.6 11.2* 8.6  NEUTROABS 3.6 7.2  --   HGB 9.0* 8.7* 8.4*  HCT 29.9* 29.1* 28.5*  MCV 68.3* 68.6* 68.7*  PLT 351 433* 384   Cardiac Enzymes: No results found for this basename: CKTOTAL, CKMB, CKMBINDEX, TROPONINI,  in the last 168 hours BNP: No components found with this basename: POCBNP,  CBG:  Recent Labs Lab 10/04/13 0732 10/04/13 1325 10/04/13 1703 10/04/13 2202 10/05/13 0714  GLUCAP 226* 271* 229* 314* 189*    No results found for this or any previous visit (from the past 240 hour(s)).   Scheduled Meds: . albuterol  2.5 mg Nebulization Q6H  . amLODipine  5 mg Oral Daily  . budesonide   0.25 mg Nebulization BID  . enoxaparin (LOVENOX) i  60 mg Subcutaneous Daily  . famotidine  20 mg Oral Daily  . ferrous gluconate  324 mg Oral TID WC  . fluticasone  2 spray Each Nare Daily  . folic acid  500 mcg Oral q morning - 10a  . guaiFENesin  600 mg Oral BID  . insulin aspart  0-9 Units Subcutaneous TID WC  . insulin aspart  3 Units Subcutaneous TID WC  . ipratropium  0.5 mg Nebulization Q6H  . loratadine  10 mg Oral Daily  . predniSONE  60 mg Oral Q breakfast    Debbora Presto, MD  [facility] Pager [phone]  If 7PM-7AM, please contact night-coverage [email or web address] Password [email or web address] 10/05/2013, 9:19 AM   LOS: 2 days

## 2013-10-05 NOTE — Progress Notes (Signed)
Inpatient Diabetes Program Recommendations  AACE/ADA: New Consensus Statement on Inpatient Glycemic Control (2013)  Target Ranges:  Prepandial:   less than 140 mg/dL      Peak postprandial:   less than 180 mg/dL (1-2 hours)      Critically ill patients:  140 - 180 mg/dL   Reason for Visit: Hyperglycemia  Results for TERRELL, SHIMKO (MRN [id number]) as of 10/05/2013 14:14  Ref. Range 10/04/2013 07:32 10/04/2013 13:25 10/04/2013 17:03 10/04/2013 22:02 10/05/2013 07:14 10/05/2013 11:16  Glucose-Capillary Latest Range: 70-99 mg/dL 865 (H) 784 (H) 696 (H) 314 (H) 189 (H) 219 (H)   Steroids decreasing. Blood sugars continue to be elevated.  Recommendations: Add HS correction. Increase meal coverage insulin to Novolog 4 units tidwc. Since steroids are decreasing, would not add basal insulin at this time unless FBS >180 mg/dL.  Will continue to follow. Thank you. Ailene Ards, RD, LDN, CDE Inpatient Diabetes Coordinator [phone]

## 2013-10-06 DIAGNOSIS — J45909 Unspecified asthma, uncomplicated: Secondary | ICD-10-CM

## 2013-10-06 LAB — GLUCOSE, CAPILLARY: Glucose-Capillary: 131 mg/dL — ABNORMAL HIGH (ref 70–99)

## 2013-10-06 MED ORDER — IPRATROPIUM-ALBUTEROL 0.5-2.5 (3) MG/3ML IN SOLN: 3.0000 mL | RESPIRATORY_TRACT | Status: AC | PRN

## 2013-10-06 MED ORDER — ALBUTEROL SULFATE HFA 108 (90 BASE) MCG/ACT IN AERS: 2.0000 | INHALATION_SPRAY | RESPIRATORY_TRACT | Status: DC | PRN

## 2013-10-06 MED ORDER — GUAIFENESIN ER 600 MG PO TB12: 600.0000 mg | ORAL_TABLET | Freq: Two times a day (BID) | ORAL | Status: DC

## 2013-10-06 MED ORDER — PREDNISONE 20 MG PO TABS: ORAL_TABLET | ORAL | Status: DC

## 2013-10-06 MED ORDER — AZITHROMYCIN 250 MG PO TABS: 250.0000 mg | ORAL_TABLET | Freq: Every day | ORAL | Status: DC

## 2013-10-06 NOTE — Progress Notes (Signed)
10/06/2013 1115 [facility] delivered to room pt nebulizer machine. Isidoro Donning RN CCM Case Mgmt phone [phone]

## 2013-10-06 NOTE — Progress Notes (Signed)
Pt discharged home. Pt verbalized understanding discharge instructions and follow-up care if she needs. Pt denied having pain at discharge. Pt wheeled down by NT.

## 2013-10-06 NOTE — Discharge Summary (Signed)
Physician Discharge Summary  [name] [name] NFA:[id number] DOB: Dec 15, 1975 DOA: 10/03/2013  PCP: No Pcp  Admit date: 10/03/2013 Discharge date: 10/06/2013  Recommendations for Outpatient Follow-up:  1. Pt will need to follow up with PCP in 2-3 weeks post discharge 2. Please obtain BMP to evaluate electrolytes and kidney function 3. Please also check CBC to evaluate Hg and Hct levels  Discharge Diagnoses:  Active Problems:   GERD (gastroesophageal reflux disease)   Chronic anemia   Acute respiratory failure with hypoxia   Acute asthma exacerbation   Hypokalemia   Sinusitis, chronic  Discharge Condition: Stable  Diet recommendation: Heart healthy diet discussed in details   Brief narrative:  37 year-old female with past medical history significant for bronchial asthma, sinusitis, anemia who presented to [facility] ED 10/03/2013 with worsening shortness of breath associated with productive cough of yellow sputum. Patient reported her symptoms started 3-4 days prior to this admission. Patient used bronchodilator nebulizers at home but this did not provide significant symptomatic relief. Patient was seen in emergency room one day prior to this admission and after nebulizer treatments and Solu-Medrol was subsequently discharged home. Because her symptoms did not improve she ultimately came back for further evaluation and treatment.   In ED, initial vitals were stable. The patient received 3 nebulizer treatments and IV Solu-Medrol but had persistent shortness of breath and has required the admission. Chest x-ray on admission did not show acute cardiopulmonary process.  Assessment/Plan   Principal problem:  Acute hypoxic respiratory failure due to acute asthma exacerbation  - Continued albuterol and Atrovent every 6 hours scheduled and albuterol every 2 hours as needed for shortness of breath or wheezing  - Continue budesonide twice daily nebulizer treatment  - Continue prednisone taper pack upon  discharge  - Patient was initially started on Levaquin for possible pneumonia. Chest x-ray did not reveal acute cardiopulmonary process - Pt was discharged on Zithromax PO to complete therapy  - Continue Mucinex 600 mg by mouth twice a day  - Please note that patient was on sliding scale insulin for hyperglycemia induced by steroids  Active problems:  Anemia of chronic disease  - Secondary to chronic iron deficiency with menorrhagia  - Continue ferrous sulfate supplementation  - Continue folic acid  GERD  - Continue Pepcid 20 mg daily  Hypokalemia  - resolved with oral supplementation  Sinusitis, chronic  - Continue Flonase daily  Steroid-induced hyperglycemia  - CBGs in past 24 hours are 229, 314 and 189  - Diabetic coordinator consulted  - Continued sliding scale insulin inpatient  Hypertension  - Continue Norvasc 5 mg daily   Code Status: full  Family Communication: No family at the bedside  Disposition Plan: Discharge home  Consultants:  none Procedures:  CXR Antibiotics:  Levofloxacin 11/6 - 11/08 Zithromax 11/08    Discharge Exam: Filed Vitals:   10/06/13 0610  BP: 142/96  Pulse: 85  Temp: 97.1 F (36.2 C)  Resp: 16   Filed Vitals:   10/05/13 2100 10/06/13 0217 10/06/13 0610 10/06/13 0742  BP: 157/99  142/96   Pulse: 117  85   Temp: 97 F (36.1 C)  97.1 F (36.2 C)   TempSrc:      Resp: 20  16   Height:      Weight:      SpO2: 98% 97% 94% 97%    General: Pt is alert, follows commands appropriately, not in acute distress Cardiovascular: Regular rate and rhythm, S1/S2 +, no murmurs, no rubs,  no gallops Respiratory: Clear to auscultation bilaterally, no wheezing, no crackles, no rhonchi Abdominal: Soft, non tender, non distended, bowel sounds +, no guarding Extremities: no edema, no cyanosis, pulses palpable bilaterally DP and PT Neuro: Grossly nonfocal  Discharge Instructions  Discharge Orders   Future Orders Complete By Expires   Diet - low  sodium heart healthy  As directed    Increase activity slowly  As directed        Medication List    STOP taking these medications       cephALEXin 500 MG capsule  Commonly known as:  KEFLEX      TAKE these medications       albuterol 108 (90 BASE) MCG/ACT inhaler  Commonly known as:  PROVENTIL HFA;VENTOLIN HFA  Inhale 2 puffs into the lungs every 4 (four) hours as needed for wheezing or shortness of breath.     azithromycin 250 MG tablet  Commonly known as:  ZITHROMAX  Take 1 tablet (250 mg total) by mouth daily. Take first 2 tablets together, then 1 every day until finished.     budesonide-formoterol 160-4.5 MCG/ACT inhaler  Commonly known as:  SYMBICORT  Inhale 2 puffs into the lungs 2 (two) times daily.     diphenhydrAMINE 25 mg capsule  Commonly known as:  BENADRYL  Take 25 mg by mouth daily as needed for allergies.     ferrous gluconate 216 MG tablet  Commonly known as:  FERGON  Take 1 tablet (216 mg total) by mouth 3 (three) times daily with meals. Start in 1 week     fluticasone 50 MCG/ACT nasal spray  Commonly known as:  FLONASE  Place 2 sprays into the nose daily.     folic acid 400 MCG tablet  Commonly known as:  FOLVITE  Take 400 mcg by mouth every morning.     guaiFENesin 600 MG 12 hr tablet  Commonly known as:  MUCINEX  Take 1 tablet (600 mg total) by mouth 2 (two) times daily.     ipratropium-albuterol 0.5-2.5 (3) MG/3ML Soln  Commonly known as:  DUONEB  Take 3 mLs by nebulization every 4 (four) hours as needed (for wheezing or shortness of breath).     predniSONE 20 MG tablet  Commonly known as:  DELTASONE  Take 60 mg tablet today and taper down by 10 mg daily until completed     ranitidine 150 MG capsule  Commonly known as:  ZANTAC  Take 150 mg by mouth daily. For heartburn           Follow-up Information   Follow up with [facility].   Contact information:   [address] [place]  [address] [phone]      Follow up with Debbora Presto, MD. (As needed if symptoms worsen call my cell phone [phone])    Specialty:  Internal Medicine   Contact information:   [address] [place] [address] [phone]        The results of significant diagnostics from this hospitalization (including imaging, microbiology, ancillary and laboratory) are listed below for reference.     Microbiology: Recent Results (from the past 240 hour(s))  MRSA PCR SCREENING     Status: Abnormal   Collection Time    10/05/13  1:44 PM      Result Value Range Status   MRSA by PCR POSITIVE (*) NEGATIVE Final   Comment:  The GeneXpert MRSA Assay (FDA     approved for NASAL specimens     only), is one component of a     comprehensive MRSA colonization     surveillance program. It is not     intended to diagnose MRSA     infection nor to guide or     monitor treatment for     MRSA infections.     RESULT CALLED TO, READ BACK BY AND VERIFIED WITH:     T.BROADNAX RN AT 1715 ON [id number] BY C.BONGEL     Labs: Basic Metabolic Panel:  Recent Labs Lab 10/02/13 1800 10/03/13 1945 10/04/13 0420 10/05/13 0945  NA 138 142 136 135  K 2.9* 3.1* 4.0 4.0  CL 105 107 103 102  CO2 21 22 18* 22  GLUCOSE 110* 163* 315* 243*  BUN 9 9 11 12   CREATININE 0.77 0.67 0.75 0.75  CALCIUM 9.2 9.2 9.0 9.6   Liver Function Tests:  Recent Labs Lab 10/04/13 0420  AST 15  ALT 13  ALKPHOS 85  BILITOT 0.2*  PROT 7.0  ALBUMIN 3.6   CBC:  Recent Labs Lab 10/02/13 1800 10/03/13 1945 10/04/13 0420  WBC 8.6 11.2* 8.6  NEUTROABS 3.6 7.2  --   HGB 9.0* 8.7* 8.4*  HCT 29.9* 29.1* 28.5*  MCV 68.3* 68.6* 68.7*  PLT 351 433* 384    BNP (last 3 results)  Recent Labs  05/28/13 1821  PROBNP 56.7   CBG:  Recent Labs Lab 10/05/13 0714 10/05/13 1116 10/05/13 1707 10/05/13 2146 10/06/13 0731  GLUCAP 189* 219* 209* 178* 131*     SIGNED: Time coordinating  discharge: Over 30 minutes  Debbora Presto, MD  [facility] 10/06/2013, 8:19 AM Pager [phone]  If 7PM-7AM, please contact night-coverage [email or web address] Password [email or web address]

## 2013-10-17 ENCOUNTER — Emergency Department (HOSPITAL_COMMUNITY): Payer: Medicare Other

## 2013-10-17 ENCOUNTER — Inpatient Hospital Stay (HOSPITAL_COMMUNITY)
Admission: EM | Admit: 2013-10-17 | Discharge: 2013-10-20 | DRG: 202 | Disposition: A | Discharge status: Home / Self Care | Payer: Medicare Other | Attending: Internal Medicine | Admitting: Internal Medicine

## 2013-10-17 ENCOUNTER — Encounter (HOSPITAL_COMMUNITY): Payer: Self-pay | Admitting: Emergency Medicine

## 2013-10-17 DIAGNOSIS — Z8249 Family history of ischemic heart disease and other diseases of the circulatory system: Secondary | ICD-10-CM

## 2013-10-17 DIAGNOSIS — J45902 Unspecified asthma with status asthmaticus: Principal | ICD-10-CM | POA: Diagnosis present

## 2013-10-17 DIAGNOSIS — F411 Generalized anxiety disorder: Secondary | ICD-10-CM | POA: Diagnosis present

## 2013-10-17 DIAGNOSIS — J45901 Unspecified asthma with (acute) exacerbation: Secondary | ICD-10-CM

## 2013-10-17 DIAGNOSIS — J45909 Unspecified asthma, uncomplicated: Secondary | ICD-10-CM

## 2013-10-17 DIAGNOSIS — R000 Tachycardia, unspecified: Secondary | ICD-10-CM | POA: Diagnosis present

## 2013-10-17 DIAGNOSIS — I10 Essential (primary) hypertension: Secondary | ICD-10-CM | POA: Diagnosis present

## 2013-10-17 DIAGNOSIS — F4310 Post-traumatic stress disorder, unspecified: Secondary | ICD-10-CM | POA: Diagnosis present

## 2013-10-17 DIAGNOSIS — E119 Type 2 diabetes mellitus without complications: Secondary | ICD-10-CM | POA: Diagnosis present

## 2013-10-17 DIAGNOSIS — Z6841 Body Mass Index (BMI) 40.0 and over, adult: Secondary | ICD-10-CM

## 2013-10-17 DIAGNOSIS — J9601 Acute respiratory failure with hypoxia: Secondary | ICD-10-CM

## 2013-10-17 DIAGNOSIS — D638 Anemia in other chronic diseases classified elsewhere: Secondary | ICD-10-CM | POA: Diagnosis present

## 2013-10-17 DIAGNOSIS — J329 Chronic sinusitis, unspecified: Secondary | ICD-10-CM | POA: Diagnosis present

## 2013-10-17 DIAGNOSIS — D509 Iron deficiency anemia, unspecified: Secondary | ICD-10-CM

## 2013-10-17 DIAGNOSIS — G4730 Sleep apnea, unspecified: Secondary | ICD-10-CM | POA: Diagnosis present

## 2013-10-17 DIAGNOSIS — J40 Bronchitis, not specified as acute or chronic: Secondary | ICD-10-CM

## 2013-10-17 DIAGNOSIS — R109 Unspecified abdominal pain: Secondary | ICD-10-CM

## 2013-10-17 DIAGNOSIS — Z833 Family history of diabetes mellitus: Secondary | ICD-10-CM

## 2013-10-17 DIAGNOSIS — F329 Major depressive disorder, single episode, unspecified: Secondary | ICD-10-CM | POA: Diagnosis present

## 2013-10-17 DIAGNOSIS — G4733 Obstructive sleep apnea (adult) (pediatric): Secondary | ICD-10-CM

## 2013-10-17 DIAGNOSIS — D649 Anemia, unspecified: Secondary | ICD-10-CM | POA: Diagnosis present

## 2013-10-17 DIAGNOSIS — K219 Gastro-esophageal reflux disease without esophagitis: Secondary | ICD-10-CM | POA: Diagnosis present

## 2013-10-17 DIAGNOSIS — J9600 Acute respiratory failure, unspecified whether with hypoxia or hypercapnia: Secondary | ICD-10-CM

## 2013-10-17 DIAGNOSIS — R739 Hyperglycemia, unspecified: Secondary | ICD-10-CM

## 2013-10-17 DIAGNOSIS — E876 Hypokalemia: Secondary | ICD-10-CM

## 2013-10-17 DIAGNOSIS — F3289 Other specified depressive episodes: Secondary | ICD-10-CM | POA: Diagnosis present

## 2013-10-17 DIAGNOSIS — Z87440 Personal history of urinary (tract) infections: Secondary | ICD-10-CM

## 2013-10-17 LAB — BASIC METABOLIC PANEL
Calcium: 9.2 mg/dL (ref 8.4–10.5)
Chloride: 103 mEq/L (ref 96–112)
Creatinine, Ser: 0.76 mg/dL (ref 0.50–1.10)
GFR calc Af Amer: >90 mL/min (ref >90)
GFR calc non Af Amer: >90 mL/min (ref >90)
Glucose, Bld: 235 mg/dL — ABNORMAL HIGH (ref 70–99)
Potassium: 3.2 mEq/L — ABNORMAL LOW (ref 3.5–5.1)

## 2013-10-17 LAB — MAGNESIUM: Magnesium: 2 mg/dL (ref 1.5–2.5)

## 2013-10-17 LAB — GLUCOSE, CAPILLARY: Glucose-Capillary: 213 mg/dL — ABNORMAL HIGH (ref 70–99)

## 2013-10-17 MED ORDER — SODIUM CHLORIDE 0.9 % IJ SOLN
3.0000 mL | Freq: Two times a day (BID) | INTRAMUSCULAR | Status: DC
  Administered 2013-10-17 – 2013-10-20 (×5): 3 mL via INTRAVENOUS

## 2013-10-17 MED ORDER — LORAZEPAM 1 MG PO TABS
2.0000 mg | ORAL_TABLET | Freq: Once | ORAL | Status: AC
  Administered 2013-10-17: 23:00:00 2 mg via ORAL
  Filled 2013-10-17: qty 2

## 2013-10-17 MED ORDER — SODIUM CHLORIDE 0.9 % IV SOLN: 250.0000 mL | INTRAVENOUS | Status: DC | PRN

## 2013-10-17 MED ORDER — MAGNESIUM SULFATE 40 MG/ML IJ SOLN
2.0000 g | INTRAMUSCULAR | Status: AC
  Administered 2013-10-17: 2 g via INTRAVENOUS
  Filled 2013-10-17: qty 50

## 2013-10-17 MED ORDER — MUPIROCIN 2 % EX OINT
1.0000 "application " | TOPICAL_OINTMENT | Freq: Two times a day (BID) | CUTANEOUS | Status: DC
  Administered 2013-10-17 – 2013-10-20 (×6): 1 via NASAL
  Filled 2013-10-17: qty 22

## 2013-10-17 MED ORDER — ALBUTEROL (5 MG/ML) CONTINUOUS INHALATION SOLN
15.0000 mg/h | INHALATION_SOLUTION | RESPIRATORY_TRACT | Status: AC
  Administered 2013-10-17: 15 mg/h via RESPIRATORY_TRACT

## 2013-10-17 MED ORDER — LEVOFLOXACIN 750 MG PO TABS
750.0000 mg | ORAL_TABLET | Freq: Every day | ORAL | Status: DC
  Administered 2013-10-17 – 2013-10-19 (×3): 750 mg via ORAL
  Filled 2013-10-17 (×4): qty 1

## 2013-10-17 MED ORDER — INSULIN ASPART 100 UNIT/ML ~~LOC~~ SOLN
0.0000 [IU] | Freq: Three times a day (TID) | SUBCUTANEOUS | Status: DC
  Administered 2013-10-17: 21:00:00 5 [IU] via SUBCUTANEOUS
  Administered 2013-10-18 (×2): 8 [IU] via SUBCUTANEOUS

## 2013-10-17 MED ORDER — ENOXAPARIN SODIUM 80 MG/0.8ML ~~LOC~~ SOLN
65.0000 mg | SUBCUTANEOUS | Status: DC
  Administered 2013-10-17 – 2013-10-19 (×3): 65 mg via SUBCUTANEOUS
  Filled 2013-10-17 (×4): qty 0.8

## 2013-10-17 MED ORDER — CHLORHEXIDINE GLUCONATE CLOTH 2 % EX PADS
6.0000 | MEDICATED_PAD | Freq: Every day | CUTANEOUS | Status: DC
  Administered 2013-10-19: 06:00:00 6 via TOPICAL

## 2013-10-17 MED ORDER — PREDNISONE 20 MG PO TABS
60.0000 mg | ORAL_TABLET | Freq: Once | ORAL | Status: AC
  Administered 2013-10-17: 60 mg via ORAL
  Filled 2013-10-17: qty 3

## 2013-10-17 MED ORDER — ACETAMINOPHEN 325 MG PO TABS: 650.0000 mg | ORAL_TABLET | Freq: Four times a day (QID) | ORAL | Status: DC | PRN

## 2013-10-17 MED ORDER — GUAIFENESIN ER 600 MG PO TB12
600.0000 mg | ORAL_TABLET | Freq: Two times a day (BID) | ORAL | Status: DC
  Administered 2013-10-17 – 2013-10-20 (×6): 600 mg via ORAL
  Filled 2013-10-17 (×7): qty 1

## 2013-10-17 MED ORDER — LEVALBUTEROL HCL 0.63 MG/3ML IN NEBU
0.6300 mg | INHALATION_SOLUTION | RESPIRATORY_TRACT | Status: DC | PRN
  Administered 2013-10-18: 06:00:00 0.63 mg via RESPIRATORY_TRACT
  Filled 2013-10-17: qty 3

## 2013-10-17 MED ORDER — FOLIC ACID 400 MCG PO TABS: 400.0000 ug | ORAL_TABLET | Freq: Every morning | ORAL | Status: DC

## 2013-10-17 MED ORDER — LEVALBUTEROL HCL 0.63 MG/3ML IN NEBU
0.6300 mg | INHALATION_SOLUTION | RESPIRATORY_TRACT | Status: DC
  Administered 2013-10-17 – 2013-10-20 (×14): 0.63 mg via RESPIRATORY_TRACT
  Filled 2013-10-17 (×32): qty 3

## 2013-10-17 MED ORDER — IPRATROPIUM BROMIDE 0.02 % IN SOLN
0.5000 mg | Freq: Once | RESPIRATORY_TRACT | Status: AC
  Administered 2013-10-17: 0.5 mg via RESPIRATORY_TRACT
  Filled 2013-10-17: qty 2.5

## 2013-10-17 MED ORDER — FLUTICASONE PROPIONATE 50 MCG/ACT NA SUSP
2.0000 | Freq: Every day | NASAL | Status: DC
  Administered 2013-10-18 – 2013-10-20 (×3): 2 via NASAL
  Filled 2013-10-17: qty 16

## 2013-10-17 MED ORDER — FERROUS GLUCONATE 324 (38 FE) MG PO TABS
324.0000 mg | ORAL_TABLET | Freq: Three times a day (TID) | ORAL | Status: DC
  Administered 2013-10-18 – 2013-10-20 (×7): 324 mg via ORAL
  Filled 2013-10-17 (×10): qty 1

## 2013-10-17 MED ORDER — ACETAMINOPHEN 650 MG RE SUPP: 650.0000 mg | Freq: Four times a day (QID) | RECTAL | Status: DC | PRN

## 2013-10-17 MED ORDER — IPRATROPIUM BROMIDE 0.02 % IN SOLN
0.5000 mg | RESPIRATORY_TRACT | Status: DC
  Administered 2013-10-17 (×2): 0.5 mg via RESPIRATORY_TRACT
  Filled 2013-10-17 (×2): qty 2.5

## 2013-10-17 MED ORDER — SODIUM CHLORIDE 0.9 % IJ SOLN: 3.0000 mL | INTRAMUSCULAR | Status: DC | PRN

## 2013-10-17 MED ORDER — ONDANSETRON HCL 4 MG PO TABS: 4.0000 mg | ORAL_TABLET | Freq: Four times a day (QID) | ORAL | Status: DC | PRN

## 2013-10-17 MED ORDER — ALBUTEROL SULFATE HFA 108 (90 BASE) MCG/ACT IN AERS: 2.0000 | INHALATION_SPRAY | Freq: Four times a day (QID) | RESPIRATORY_TRACT | Status: DC

## 2013-10-17 MED ORDER — ONDANSETRON HCL 4 MG/2ML IJ SOLN: 4.0000 mg | Freq: Four times a day (QID) | INTRAMUSCULAR | Status: DC | PRN

## 2013-10-17 MED ORDER — FOLIC ACID 0.5 MG HALF TAB
0.5000 mg | ORAL_TABLET | Freq: Every day | ORAL | Status: DC
  Administered 2013-10-18 – 2013-10-20 (×3): 0.5 mg via ORAL
  Filled 2013-10-17 (×3): qty 1

## 2013-10-17 MED ORDER — METHYLPREDNISOLONE SODIUM SUCC 125 MG IJ SOLR
125.0000 mg | Freq: Four times a day (QID) | INTRAMUSCULAR | Status: DC
  Administered 2013-10-17 – 2013-10-19 (×6): 125 mg via INTRAVENOUS
  Filled 2013-10-17 (×11): qty 2

## 2013-10-17 MED ORDER — FAMOTIDINE 20 MG PO TABS
20.0000 mg | ORAL_TABLET | Freq: Every day | ORAL | Status: DC
  Administered 2013-10-18 – 2013-10-20 (×3): 20 mg via ORAL
  Filled 2013-10-17 (×3): qty 1

## 2013-10-17 NOTE — Progress Notes (Signed)
Discussed admission status with Dr. Dhungel. 

## 2013-10-17 NOTE — ED Notes (Signed)
Bed: ZO10 Expected date:  Expected time:  Means of arrival:  Comments: ems- 37 yo asthma

## 2013-10-17 NOTE — ED Notes (Signed)
hospitalist at bedside

## 2013-10-17 NOTE — ED Notes (Signed)
X-ray at bedside

## 2013-10-17 NOTE — ED Notes (Signed)
Per ems: pt from home, called out d/t asthma and SOB. Pt reports using 7 breathing treatments since 0500 this morning. EMS assessed pt this morning, pt refused transfer at that time. 5mg  albuterol neb given in route. ST on monitor - pulse 110. bp 168/72

## 2013-10-17 NOTE — Progress Notes (Signed)
The best peak flow with 3 attempts was 90cc

## 2013-10-17 NOTE — H&P (Signed)
[facility] History and Physical  [name] [name] WJX:[id number] DOB: 21-Jan-1976 DOA: 10/17/2013  Referring physician: ED PCP: No Pcp   Chief Complaint: Shortness of breath with wheezing since 2 days  HPI:  37 year old obese female with history of moderate persistent asthma, diabetes mellitus, chronic sinusitis, iron deficiency anemia who presented with shortness of breath relieved persistent wheezing for past 2 days. She was admitted about 10 days back with similar symptoms off acute asthma exacerbation with hypoxic respiratory failure. Patient also reports subjective fever and cough. She took frequent rounds of nebulizer without much beef. She also reports finishing a course of prednisone due to his back after her recent discharge. In the ED patient was noted to be quite wheezy and tachycardic. She was treated with one dose of IV sodium Medrol, albuterol and Atrovent nebulizer and IV magnesium with minimal improvement in symptoms. Her oxygen sat was improved to 93% on room air alert she was still tachycardic to 120s even after 45 minutes of continuous nebulizer. [facility] asked to admit patient under observation. Patient reports taking her medications regularly. She uses albuterol and Atrovent nebulizers, albuterol MDI and Symbicort at home. Denies any sick contacts or any specific triggering agents. She reports having asthma symptoms for possible 12 years and has exacerbation symptoms almost daily 1-2 months with frequent ED visits. She denies smoking or being exposed to smoke or fumes. She reports having subjective fevers and chronic sinusitis. She denies any headache, blurred vision, chest pain, palpitations, nausea, vomiting, abdominal pain, bodyaches, bowel or urinary symptoms. Patient has received flu  vaccine during the season. Denies recent travel.  Review of Systems:  Constitutional: Subjective fever, denies chills, diaphoresis, appetite change and fatigue.  HEENT: Nasal  congestion and sinusitis, Denies photophobia, eye pain, redness, hearing loss, ear pain, congestion, sore throat, rhinorrhea, sneezing, mouth sores, trouble swallowing, neck pain, neck stiffness and tinnitus.   Respiratory: SOB, DOE, cough, and wheezing, denies chest tightness,   Cardiovascular: Denies chest pain, palpitations and leg swelling.  Gastrointestinal: Denies nausea, vomiting, abdominal pain, diarrhea, constipation, blood in stool and abdominal distention.  Genitourinary: Denies dysuria, urgency, frequency, hematuria, flank pain and difficulty urinating.  Endocrine:polyuria, polydipsia. Musculoskeletal: Denies myalgias, back pain, joint swelling, arthralgias and gait problem.  Neurological: Denies dizziness, seizures, syncope, weakness, light-headedness, numbness and headaches.     Past Medical History  Diagnosis Date  . Asthma   . Hypertension   . Chronic anemia   . Abnormal TSH   . Morbid obesity   . GERD (gastroesophageal reflux disease)   . Seasonal allergies   . Nasal polyps   . Diabetes mellitus     type 2  . Chronic bronchitis   . Sleep apnea   . Shortness of breath 12/14/11    "related to how bad my asthma is; sometimes @ rest, lying down, w/exertion"  . Pneumonia   . Blood transfusion   . Inguinal hernia unilateral, non-recurrent     right  . Kidney infection 2008  . Headache(784.0)   . Anxiety   . Depression   . PTSD (post-traumatic stress disorder)     "related to prior hospitalizations; things that happened when I was in hospital"  . Asthma    Past Surgical History  Procedure Laterality Date  . Cervical cerclage  1999; 2002  . Tubal ligation  2002  . Cervical cerclage    . Tubal ligation     Social History:  reports that she has never smoked. She has never used smokeless  tobacco. She reports that she does not drink alcohol or use illicit drugs.  Allergies  Allergen Reactions  . Aspirin Anaphylaxis  . Aspirin Anaphylaxis  . Ibuprofen  Anaphylaxis  . Shellfish Allergy Hives and Swelling    Facial and oral swelling  . Menthol Swelling    Tongue swelling  . Other Other (See Comments)    Plastic thermometer covers cause sloughing off of skin on tongue and inside mouth    Family History  Problem Relation Age of Onset  . Diabetes Mother   . Heart disease Father     Prior to Admission medications   Medication Sig Start Date End Date Taking? Authorizing Provider  albuterol (PROVENTIL HFA;VENTOLIN HFA) 108 (90 BASE) MCG/ACT inhaler Inhale 2 puffs into the lungs every 4 (four) hours as needed for wheezing or shortness of breath. 10/06/13  Yes Dorothea Ogle, MD  budesonide-formoterol [facility]) 160-4.5 MCG/ACT inhaler Inhale 2 puffs into the lungs 2 (two) times daily.   Yes Historical Provider, MD  diphenhydrAMINE (BENADRYL) 25 mg capsule Take 25 mg by mouth daily as needed for allergies.    Yes Historical Provider, MD  ferrous gluconate (FERGON) 216 MG tablet Take 1 tablet (216 mg total) by mouth 3 (three) times daily with meals. Start in 1 week 08/26/13  Yes Zannie Cove, MD  fluticasone [facility]) 50 MCG/ACT nasal spray Place 2 sprays into the nose daily. 05/30/13  Yes Henderson Cloud, MD  folic acid (FOLVITE) 400 MCG tablet Take 400 mcg by mouth every morning.    Yes Historical Provider, MD  ipratropium-albuterol (DUONEB) 0.5-2.5 (3) MG/3ML SOLN Take 3 mLs by nebulization every 4 (four) hours as needed (for wheezing or shortness of breath). 10/06/13  Yes Dorothea Ogle, MD  ranitidine (ZANTAC) 150 MG capsule Take 150 mg by mouth daily. For heartburn   Yes Historical Provider, MD  azithromycin (ZITHROMAX) 250 MG tablet Take 1 tablet (250 mg total) by mouth daily. Take first 2 tablets together, then 1 every day until finished. 10/06/13   Dorothea Ogle, MD    Physical Exam:  Filed Vitals:   10/17/13 1636 10/17/13 1700 10/17/13 1730 10/17/13 1818  BP:    126/63  Pulse:  122 125   Temp:      TempSrc:      Resp:  22 18  20   SpO2: 9% 100% 100% 97%    Constitutional: Vital signs reviewed.  Middle aged obese female lying in bed in no acute distress HEENT: No pallor, no icterus, moist oral mucosa, no sinus tenderness Chest: Diffuse bilateral expiratory wheezes, no rhonchi or crackles Cardiovascular: S1 and S2 tachycardic, no murmurs rub or gallop Abdominal: Soft. Non-tender, non-distended, bowel sounds are normal, no masses, organomegaly, or guarding present.   extremities: Warm, no edema CNS: AAO x3  Labs on Admission:  Basic Metabolic Panel: No results found for this basename: NA, K, CL, CO2, GLUCOSE, BUN, CREATININE, CALCIUM, MG, PHOS,  in the last 168 hours Liver Function Tests: No results found for this basename: AST, ALT, ALKPHOS, BILITOT, PROT, ALBUMIN,  in the last 168 hours No results found for this basename: LIPASE, AMYLASE,  in the last 168 hours No results found for this basename: AMMONIA,  in the last 168 hours CBC: No results found for this basename: WBC, NEUTROABS, HGB, HCT, MCV, PLT,  in the last 168 hours Cardiac Enzymes: No results found for this basename: CKTOTAL, CKMB, CKMBINDEX, TROPONINI,  in the last 168 hours BNP: No components found  with this basename: POCBNP,  CBG: No results found for this basename: GLUCAP,  in the last 168 hours  Radiological Exams on Admission: Dg Chest Port 1 View  10/17/2013   CLINICAL DATA:  Shortness of breath, hypertension, asthma  EXAM: PORTABLE CHEST - 1 VIEW  COMPARISON:  10/02/2013  FINDINGS: Slight rotation to the left. Normal heart size and vascularity. No focal pneumonia, collapse or consolidation. No effusion or pneumothorax. No osseous abnormality.  IMPRESSION: No acute chest findings.  Stable exam.   Electronically Signed   By: Ruel Favors M.D.   On: 10/17/2013 16:48      Assessment/Plan Principal Problem:   Asthma with status asthmaticus Admit to observation telemetry I will place her on IV Solu-Medrol 125 mg every 6 hours. I will  place her on Xopenex nebulizer every 4 hours and give the 2 hours as needed. I will also place her on Atrovent nebulizer every 4 hours. -Monitor O2 sats. I will also place her on Levaquin and given acute bronchitis-like symptoms. She will need to be discharged on longer steroid taper. She reports having an appointment set up with Dr. Sherene Sires later this month. -Check BMET and magnesium level.  Active Problems:    Tachycardia In the setting of acute asthma exacerbation. Will place on Xopenex instead of albuterol    GERD (gastroesophageal reflux disease) Continue ranitidine    Chronic anemia Continue  iron supplements    Type 2 diabetes mellitus Not on any medications. Last A1c in July of 6.6. Repeat A1c.       HTN (hypertension) Not on any medications (stable  DVT prophylaxis: Subcutaneous Lovenox  Diet: Diabetic    Code Status: Full code Family Communication: At bedside Disposition Plan: Home once improved  Eddie North [facility] Pager [phone]  If 7PM-7AM, please contact night-coverage [email or web address] Password [facility] 10/17/2013, 6:49 PM  Total time spent on admission: 55 MINUTES

## 2013-10-17 NOTE — ED Provider Notes (Signed)
CSN: [id number]     Arrival date & time 10/17/13  1609 History   First MD Initiated Contact with Patient 10/17/13 1610     Chief Complaint  Patient presents with  . Shortness of Breath  . Asthma   (Consider location/radiation/quality/duration/timing/severity/associated sxs/prior Treatment) HPI  [name] [name] is a 37 y.o. female BIBEMS c/o SOB & wheezing worsening over the course of the day. Pt reports subjective fever. Has had 7x nebs at home with no relief. Pt has h/o intubations (2012), frequent hospitalizations for asthma. Not on chronic steroids, but finished burst x1 week ago. Level V caveat secondary to acuity. Patient denies any history of DVT or PE, recent immobilizations, calf pain or leg swelling.  Past Medical History  Diagnosis Date  . Asthma   . Hypertension   . Chronic anemia   . Abnormal TSH   . Morbid obesity   . GERD (gastroesophageal reflux disease)   . Seasonal allergies   . Nasal polyps   . Diabetes mellitus     type 2  . Chronic bronchitis   . Sleep apnea   . Shortness of breath 12/14/11    "related to how bad my asthma is; sometimes @ rest, lying down, w/exertion"  . Pneumonia   . Blood transfusion   . Inguinal hernia unilateral, non-recurrent     right  . Kidney infection 2008  . Headache(784.0)   . Anxiety   . Depression   . PTSD (post-traumatic stress disorder)     "related to prior hospitalizations; things that happened when I was in hospital"  . Asthma    Past Surgical History  Procedure Laterality Date  . Cervical cerclage  1999; 2002  . Tubal ligation  2002  . Cervical cerclage    . Tubal ligation     Family History  Problem Relation Age of Onset  . Diabetes Mother   . Heart disease Father    History  Substance Use Topics  . Smoking status: Never Smoker   . Smokeless tobacco: Never Used  . Alcohol Use: No     Comment: 12/14/11 "only on birthdays; parties, and such"   OB History   Grav Para Term Preterm Abortions TAB SAB Ect  Mult Living                 Review of Systems 10 systems reviewed and found to be negative, except as noted in the HPI  Allergies  Aspirin; Aspirin; Ibuprofen; Shellfish allergy; Menthol; and Other  Home Medications   Current Outpatient Rx  Name  Route  Sig  Dispense  Refill  . albuterol (PROVENTIL HFA;VENTOLIN HFA) 108 (90 BASE) MCG/ACT inhaler   Inhalation   Inhale 2 puffs into the lungs every 4 (four) hours as needed for wheezing or shortness of breath.   1 Inhaler   11   . budesonide-formoterol (SYMBICORT) 160-4.5 MCG/ACT inhaler   Inhalation   Inhale 2 puffs into the lungs 2 (two) times daily.         . diphenhydrAMINE (BENADRYL) 25 mg capsule   Oral   Take 25 mg by mouth daily as needed for allergies.          . ferrous gluconate (FERGON) 216 MG tablet   Oral   Take 1 tablet (216 mg total) by mouth 3 (three) times daily with meals. Start in 1 week   60 tablet   0   . fluticasone (FLONASE) 50 MCG/ACT nasal spray   Nasal   Place 2  sprays into the nose daily.   16 g   2   . folic acid (FOLVITE) 400 MCG tablet   Oral   Take 400 mcg by mouth every morning.          Marland Kitchen ipratropium-albuterol (DUONEB) 0.5-2.5 (3) MG/3ML SOLN   Nebulization   Take 3 mLs by nebulization every 4 (four) hours as needed (for wheezing or shortness of breath).   360 mL   11   . ranitidine (ZANTAC) 150 MG capsule   Oral   Take 150 mg by mouth daily. For heartburn         . azithromycin (ZITHROMAX) 250 MG tablet   Oral   Take 1 tablet (250 mg total) by mouth daily. Take first 2 tablets together, then 1 every day until finished.   7 tablet   0    BP 159/92  Pulse 125  Temp(Src) 97.7 F (36.5 C) (Oral)  Resp 18  SpO2 100%  LMP 09/16/2013 Physical Exam  Nursing note and vitals reviewed. Constitutional: She is oriented to person, place, and time. She appears well-developed and well-nourished. No distress.  obese  HENT:  Head: Normocephalic.  Eyes: Conjunctivae and EOM  are normal.  Cardiovascular:  Mild tachycardia  Pulmonary/Chest: Effort normal. No stridor.  Pt is tripoding, no stridor, audible wheezing. Gold air movement in all feilds  Musculoskeletal: Normal range of motion.  Neurological: She is alert and oriented to person, place, and time.  Psychiatric: She has a normal mood and affect.    ED Course  Procedures (including critical care time) Labs Review Labs Reviewed - No data to display Imaging Review Dg Chest Port 1 View  10/17/2013   CLINICAL DATA:  Shortness of breath, hypertension, asthma  EXAM: PORTABLE CHEST - 1 VIEW  COMPARISON:  10/02/2013  FINDINGS: Slight rotation to the left. Normal heart size and vascularity. No focal pneumonia, collapse or consolidation. No effusion or pneumothorax. No osseous abnormality.  IMPRESSION: No acute chest findings.  Stable exam.   Electronically Signed   By: Ruel Favors M.D.   On: 10/17/2013 16:48    EKG Interpretation   None       MDM   1. Asthma exacerbation      Filed Vitals:   10/17/13 1616 10/17/13 1636 10/17/13 1700 10/17/13 1730  BP: 159/92     Pulse: 119  122 125  Temp: 97.7 F (36.5 C)     TempSrc: Oral     Resp: 24  22 18   SpO2: 100% 9% 100% 100%     [name] [name] is a 37 y.o. female with severe asthma requiring intubations in the past with SOB, and subjective fever onset this AM. CAT (duoneb), Prednisone and mag sulfate initiated.   Portable asked rays show no infiltrate.  Patient reevaluated after continuous nebulizer given. She does show slight improvement however there is still significant expiratory wheezing. Oxygen removed and the patient's saturation rate remained above 93% at rest. Heart rate remains in the 120s 45 minutes after and does continuous nebulizer. Patient needs admission for asthma exacerbation. Patient agrees with care plan.  Pt will be admitted to Dr. Gonzella Lex for Obs.   Medications  magnesium sulfate IVPB 2 g 50 mL (2 g Intravenous New  Bag/Given 10/17/13 1703)  albuterol (PROVENTIL,VENTOLIN) solution continuous neb (0 mg/hr Nebulization Stopped 10/17/13 1736)  predniSONE (DELTASONE) tablet 60 mg (60 mg Oral Given 10/17/13 1638)  ipratropium (ATROVENT) nebulizer solution 0.5 mg (0.5 mg Nebulization Given 10/17/13 1635)  Note: Portions of this report may have been transcribed using voice recognition software. Every effort was made to ensure accuracy; however, inadvertent computerized transcription errors may be present      Wynetta Emery, PA-C 10/17/13 1814

## 2013-10-17 NOTE — Progress Notes (Signed)
Patient confirms her pcp is Dr. Sherene Sires.  Patient has made an appointment, she has not seen this physician yet.

## 2013-10-17 NOTE — Progress Notes (Signed)
Utilization Review completed.  Avaleigh Decuir RN CM  

## 2013-10-18 DIAGNOSIS — J45901 Unspecified asthma with (acute) exacerbation: Secondary | ICD-10-CM

## 2013-10-18 LAB — GLUCOSE, CAPILLARY
Glucose-Capillary: 263 mg/dL — ABNORMAL HIGH (ref 70–99)
Glucose-Capillary: 299 mg/dL — ABNORMAL HIGH (ref 70–99)
Glucose-Capillary: 301 mg/dL — ABNORMAL HIGH (ref 70–99)

## 2013-10-18 MED ORDER — INSULIN ASPART 100 UNIT/ML ~~LOC~~ SOLN
6.0000 [IU] | Freq: Three times a day (TID) | SUBCUTANEOUS | Status: DC
Start: 2013-10-18 — End: 2013-10-20
  Administered 2013-10-18 – 2013-10-20 (×6): 6 [IU] via SUBCUTANEOUS

## 2013-10-18 MED ORDER — IPRATROPIUM BROMIDE 0.02 % IN SOLN
0.5000 mg | Freq: Four times a day (QID) | RESPIRATORY_TRACT | Status: DC
  Administered 2013-10-18 – 2013-10-20 (×7): 0.5 mg via RESPIRATORY_TRACT
  Filled 2013-10-18 (×8): qty 2.5

## 2013-10-18 MED ORDER — SODIUM CHLORIDE 0.9 % IV BOLUS (SEPSIS)
500.0000 mL | Freq: Once | INTRAVENOUS | Status: AC
  Administered 2013-10-18: 01:00:00 500 mL via INTRAVENOUS

## 2013-10-18 MED ORDER — METOPROLOL TARTRATE 1 MG/ML IV SOLN
5.0000 mg | Freq: Once | INTRAVENOUS | Status: AC
  Administered 2013-10-18: 5 mg via INTRAVENOUS
  Filled 2013-10-18: qty 5

## 2013-10-18 MED ORDER — SODIUM CHLORIDE 0.9 % IV BOLUS (SEPSIS)
500.0000 mL | Freq: Once | INTRAVENOUS | Status: AC
  Administered 2013-10-18: 03:00:00 500 mL via INTRAVENOUS

## 2013-10-18 MED ORDER — INSULIN ASPART 100 UNIT/ML ~~LOC~~ SOLN
0.0000 [IU] | Freq: Three times a day (TID) | SUBCUTANEOUS | Status: DC
  Administered 2013-10-18 – 2013-10-19 (×2): 11 [IU] via SUBCUTANEOUS
  Administered 2013-10-19 (×2): 7 [IU] via SUBCUTANEOUS
  Administered 2013-10-20: 15 [IU] via SUBCUTANEOUS
  Administered 2013-10-20: 12:00:00 20 [IU] via SUBCUTANEOUS

## 2013-10-18 NOTE — Progress Notes (Signed)
[facility] PROGRESS NOTE  [name] [name] ZOX:[id number] DOB: 1975-12-27 DOA: 10/17/2013 PCP: Sandrea Hughs, MD  Assessment/Plan: Asthma -Still with significant wheezing today. -Continue steroids at current dose (no tapering as of yet). -Continue nebs. -Has follow up with pulmonary for later this month.  Tachycardia -Sinus. -Related to acute asthma and use of B-agonists.  Dm 2 -CBGs remain elevated. -Suspect worsened by steroid use. -Increase SSI to resistant.  GERD -Continue ranitidine.  AOCD -Continue iron supplementation.  HTN -Well controlled.  Code Status: Full Code Family Communication: Patient only  Disposition Plan: Home when medically stable.   Consultants:  None   Antibiotics:  Levaquin 10/17/13-->   Subjective: Still wheezing today. Quite talkative and tangential.  Objective: Filed Vitals:   10/18/13 0215 10/18/13 0626 10/18/13 0846 10/18/13 1157  BP: 155/95 125/93    Pulse: 118 127    Temp:  98.3 F (36.8 C)    TempSrc:  Oral    Resp: 22 22    Height:      Weight:      SpO2: 97% 92% 94% 98%    Intake/Output Summary (Last 24 hours) at 10/18/13 1237 Last data filed at 10/18/13 0321  Gross per 24 hour  Intake    243 ml  Output    550 ml  Net   -307 ml   Filed Weights   10/18/13 0032  Weight: 135.444 kg (298 lb 9.6 oz)    Exam:   General:  AA Ox3  Cardiovascular: Tachy, regular  Respiratory: diffuse bilateral expiratory wheezes.  Abdomen: Obese/S/NT/ND/+BS  Extremities: trace bilateral edema.   Neurologic:  Intact and non-focal.  Data Reviewed: Basic Metabolic Panel:  Recent Labs Lab 10/17/13 2022  NA 137  K 3.2*  CL 103  CO2 19  GLUCOSE 235*  BUN 8  CREATININE 0.76  CALCIUM 9.2  MG 2.0   Liver Function Tests: No results found for this basename: AST, ALT, ALKPHOS, BILITOT, PROT, ALBUMIN,  in the last 168 hours No results found for this basename: LIPASE, AMYLASE,  in the last 168 hours No results  found for this basename: AMMONIA,  in the last 168 hours CBC: No results found for this basename: WBC, NEUTROABS, HGB, HCT, MCV, PLT,  in the last 168 hours Cardiac Enzymes: No results found for this basename: CKTOTAL, CKMB, CKMBINDEX, TROPONINI,  in the last 168 hours BNP (last 3 results)  Recent Labs  05/28/13 1821  PROBNP 56.7   CBG:  Recent Labs Lab 10/17/13 2008 10/18/13 0726 10/18/13 1148  GLUCAP 213* 263* 299*    No results found for this or any previous visit (from the past 240 hour(s)).   Studies: Dg Chest Port 1 View  10/17/2013   CLINICAL DATA:  Shortness of breath, hypertension, asthma  EXAM: PORTABLE CHEST - 1 VIEW  COMPARISON:  10/02/2013  FINDINGS: Slight rotation to the left. Normal heart size and vascularity. No focal pneumonia, collapse or consolidation. No effusion or pneumothorax. No osseous abnormality.  IMPRESSION: No acute chest findings.  Stable exam.   Electronically Signed   By: Ruel Favors M.D.   On: 10/17/2013 16:48    Scheduled Meds: . Chlorhexidine Gluconate Cloth  6 each Topical Q0600  . enoxaparin (LOVENOX) injection  65 mg Subcutaneous Q24H  . famotidine  20 mg Oral Daily  . ferrous gluconate  324 mg Oral TID WC  . fluticasone  2 spray Each Nare Daily  . folic acid  0.5 mg Oral Daily  . guaiFENesin  600  mg Oral BID  . insulin aspart  0-15 Units Subcutaneous TID WC  . ipratropium  0.5 mg Nebulization QID  . levalbuterol  0.63 mg Nebulization Q4H  . levofloxacin  750 mg Oral QHS  . methylPREDNISolone (SOLU-MEDROL) injection  125 mg Intravenous Q6H  . mupirocin ointment  1 application Nasal BID  . sodium chloride  3 mL Intravenous Q12H   Continuous Infusions:   Principal Problem:   Asthma with status asthmaticus Active Problems:   GERD (gastroesophageal reflux disease)   Chronic anemia   Type 2 diabetes mellitus   Tachycardia   Sinusitis, chronic   HTN (hypertension)    Time spent: 35 minutes    HERNANDEZ  [name],[name] Bachicha  [facility] Pager [phone]  If 7PM-7AM, please contact night-coverage at [email or web address], password [facility] 10/18/2013, 12:37 PM  LOS: 1 day

## 2013-10-18 NOTE — ED Provider Notes (Signed)
Medical screening examination/treatment/procedure(s) were performed by non-physician practitioner and as supervising physician I was immediately available for consultation/collaboration.  EKG Interpretation    Date/Time:    Ventricular Rate:    PR Interval:    QRS Duration:   QT Interval:    QTC Calculation:   R Axis:     Text Interpretation:                Shon Baton, MD 10/18/13 1238

## 2013-10-18 NOTE — Progress Notes (Signed)
Inpatient Diabetes Program Recommendations  AACE/ADA: New Consensus Statement on Inpatient Glycemic Control (2013)  Target Ranges:  Prepandial:   less than 140 mg/dL      Peak postprandial:   less than 180 mg/dL (1-2 hours)      Critically ill patients:  140 - 180 mg/dL   Results for MAURYA, NETHERY (MRN [id number]) as of 10/18/2013 12:11  Ref. Range 10/17/2013 20:08 10/18/2013 07:26 10/18/2013 11:48  Glucose-Capillary Latest Range: 70-99 mg/dL 130 (H) 865 (H) 784 (H)    Inpatient Diabetes Program Recommendations Insulin - Basal: Please consider ordering low dose basal insulin if continued on steroids; recommend starting Levemir 10 units Q24H (starting now). Correction (SSI): Please increase Novolog correction to resistant scale while on steroids.  Also, please consider adding Novolog bedtime correction scale.  Note: Patient has a history of diabetes but does not taken any medications for diabetes management as an outpatient.  Currently, patient is ordered to receive Novolog 0-15 units AC for inpatient glycemic control.  Patient is ordered Solumedrol 125 mg Q6H which is contributing to hyperglycemia.  Please increase Novolog correction to resistant scale and add Novolog bedtime correction scale.  If patient will continue on steroids, please consider ordering low dose basal insulin.  Will continue to follow.  Thanks, Orlando Penner, RN, MSN, CCRN Diabetes Coordinator Inpatient Diabetes Program [phone] (Team Pager) [phone] (AP office) [phone] [facility] office)

## 2013-10-19 LAB — BASIC METABOLIC PANEL
BUN: 16 mg/dL (ref 6–23)
CO2: 17 mEq/L — ABNORMAL LOW (ref 19–32)
Calcium: 9.9 mg/dL (ref 8.4–10.5)
GFR calc non Af Amer: >90 mL/min (ref >90)
Glucose, Bld: 303 mg/dL — ABNORMAL HIGH (ref 70–99)

## 2013-10-19 LAB — CBC
HCT: 30.2 % — ABNORMAL LOW (ref 36.0–46.0)
MCH: 20.4 pg — ABNORMAL LOW (ref 26.0–34.0)
MCHC: 30.1 g/dL (ref 30.0–36.0)
MCV: 67.6 fL — ABNORMAL LOW (ref 78.0–100.0)
Platelets: 337 10*3/uL (ref 150–400)
RBC: 4.47 MIL/uL (ref 3.87–5.11)
WBC: 18.9 10*3/uL — ABNORMAL HIGH (ref 4.0–10.5)

## 2013-10-19 LAB — GLUCOSE, CAPILLARY
Glucose-Capillary: 238 mg/dL — ABNORMAL HIGH (ref 70–99)
Glucose-Capillary: 297 mg/dL — ABNORMAL HIGH (ref 70–99)

## 2013-10-19 MED ORDER — METHYLPREDNISOLONE SODIUM SUCC 125 MG IJ SOLR
80.0000 mg | Freq: Three times a day (TID) | INTRAMUSCULAR | Status: DC
  Administered 2013-10-19 – 2013-10-20 (×2): 80 mg via INTRAVENOUS
  Filled 2013-10-19 (×5): qty 1.28

## 2013-10-19 NOTE — Progress Notes (Signed)
Inpatient Diabetes Program Recommendations  AACE/ADA: New Consensus Statement on Inpatient Glycemic Control (2013)  Target Ranges:  Prepandial:   less than 140 mg/dL      Peak postprandial:   less than 180 mg/dL (1-2 hours)      Critically ill patients:  140 - 180 mg/dL   Reason for Visit: Hyperglycemia  Eating 100%.  Blood sugars in 200-300s.  Results for [name] [name], [name] [name] (MRN [id number]) as of 10/19/2013 10:10  Ref. Range 10/17/2013 20:08 10/18/2013 07:26 10/18/2013 11:48 10/18/2013 16:44 10/18/2013 22:27 10/19/2013 08:01  Glucose-Capillary Latest Range: 70-99 mg/dL 562 (H) 130 (H) 865 (H) 285 (H) 301 (H) 238 (H)   Please consider addition of basal insulin - Levemir 10 units Q24 hours. Add HS correction.  Will follow while inpatient. Thank you. Ailene Ards, RD, LDN, CDE Inpatient Diabetes Coordinator [phone]

## 2013-10-19 NOTE — Progress Notes (Signed)
[facility] PROGRESS NOTE  [name] [name] ZOX:[id number] DOB: 1976/11/07 DOA: 10/17/2013 PCP: Sandrea Hughs, MD  Assessment/Plan: Asthma -Appears much improved today. -Start to titrate steroids today. -Continue nebs. -Has follow up with pulmonary for later this month.  Tachycardia -Sinus. -Related to acute asthma and use of B-agonists. -Improved.  Dm 2 -CBGs remain elevated. -Suspect worsened by steroid use. -Increase SSI to resistant. -Suspect will start to trend down with initiation of steroid taper.  GERD -Continue ranitidine.  AOCD -Continue iron supplementation.  HTN -Well controlled.  Code Status: Full Code Family Communication: Patient only  Disposition Plan: Home when medically stable.   Consultants:  None   Antibiotics:  Levaquin 10/17/13-->   Subjective: Feels like her SOB has improved significantly.  Objective: Filed Vitals:   10/18/13 2003 10/18/13 2300 10/19/13 0500 10/19/13 0901  BP:  147/87 139/89   Pulse:  122 108   Temp:  98.1 F (36.7 C) 98.6 F (37 C)   TempSrc:  Oral Oral   Resp:  20 22   Height:      Weight:      SpO2: 98% 100% 100% 100%    Intake/Output Summary (Last 24 hours) at 10/19/13 1422 Last data filed at 10/19/13 0900  Gross per 24 hour  Intake    240 ml  Output    200 ml  Net     40 ml   Filed Weights   10/18/13 0032  Weight: 135.444 kg (298 lb 9.6 oz)    Exam:   General:  AA Ox3  Cardiovascular: Tachy, regular  Respiratory: Mild exp wheezes  Abdomen: Obese/S/NT/ND/+BS  Extremities: trace bilateral edema.   Neurologic:  Intact and non-focal.  Data Reviewed: Basic Metabolic Panel:  Recent Labs Lab 10/17/13 2022 10/19/13 0500  NA 137 136  K 3.2* 4.1  CL 103 105  CO2 19 17*  GLUCOSE 235* 303*  BUN 8 16  CREATININE 0.76 0.78  CALCIUM 9.2 9.9  MG 2.0  --    Liver Function Tests: No results found for this basename: AST, ALT, ALKPHOS, BILITOT, PROT, ALBUMIN,  in the last 168  hours No results found for this basename: LIPASE, AMYLASE,  in the last 168 hours No results found for this basename: AMMONIA,  in the last 168 hours CBC:  Recent Labs Lab 10/19/13 0500  WBC 18.9*  HGB 9.1*  HCT 30.2*  MCV 67.6*  PLT 337   Cardiac Enzymes: No results found for this basename: CKTOTAL, CKMB, CKMBINDEX, TROPONINI,  in the last 168 hours BNP (last 3 results)  Recent Labs  05/28/13 1821  PROBNP 56.7   CBG:  Recent Labs Lab 10/18/13 1148 10/18/13 1644 10/18/13 2227 10/19/13 0801 10/19/13 1202  GLUCAP 299* 285* 301* 238* 297*    No results found for this or any previous visit (from the past 240 hour(s)).   Studies: Dg Chest Port 1 View  10/17/2013   CLINICAL DATA:  Shortness of breath, hypertension, asthma  EXAM: PORTABLE CHEST - 1 VIEW  COMPARISON:  10/02/2013  FINDINGS: Slight rotation to the left. Normal heart size and vascularity. No focal pneumonia, collapse or consolidation. No effusion or pneumothorax. No osseous abnormality.  IMPRESSION: No acute chest findings.  Stable exam.   Electronically Signed   By: Ruel Favors M.D.   On: 10/17/2013 16:48    Scheduled Meds: . Chlorhexidine Gluconate Cloth  6 each Topical Q0600  . enoxaparin (LOVENOX) injection  65 mg Subcutaneous Q24H  . famotidine  20 mg Oral  Daily  . ferrous gluconate  324 mg Oral TID WC  . fluticasone  2 spray Each Nare Daily  . folic acid  0.5 mg Oral Daily  . guaiFENesin  600 mg Oral BID  . insulin aspart  0-20 Units Subcutaneous TID WC  . insulin aspart  6 Units Subcutaneous TID WC  . ipratropium  0.5 mg Nebulization QID  . levalbuterol  0.63 mg Nebulization Q4H  . levofloxacin  750 mg Oral QHS  . methylPREDNISolone (SOLU-MEDROL) injection  80 mg Intravenous Q8H  . mupirocin ointment  1 application Nasal BID  . sodium chloride  3 mL Intravenous Q12H   Continuous Infusions:   Principal Problem:   Asthma with status asthmaticus Active Problems:   GERD (gastroesophageal  reflux disease)   Chronic anemia   Type 2 diabetes mellitus   Tachycardia   Sinusitis, chronic   HTN (hypertension)    Time spent: 35 minutes    HERNANDEZ ACOSTA,ESTELA  [facility] Pager [phone]  If 7PM-7AM, please contact night-coverage at [email or web address], password [facility] 10/19/2013, 2:22 PM  LOS: 2 days

## 2013-10-20 DIAGNOSIS — D638 Anemia in other chronic diseases classified elsewhere: Secondary | ICD-10-CM

## 2013-10-20 LAB — CBC
HCT: 29.2 % — ABNORMAL LOW (ref 36.0–46.0)
MCH: 20.2 pg — ABNORMAL LOW (ref 26.0–34.0)
MCHC: 29.5 g/dL — ABNORMAL LOW (ref 30.0–36.0)
MCV: 68.7 fL — ABNORMAL LOW (ref 78.0–100.0)
Platelets: 330 10*3/uL (ref 150–400)
RBC: 4.25 MIL/uL (ref 3.87–5.11)
WBC: 11.6 10*3/uL — ABNORMAL HIGH (ref 4.0–10.5)

## 2013-10-20 LAB — BASIC METABOLIC PANEL
CO2: 22 mEq/L (ref 19–32)
Calcium: 9 mg/dL (ref 8.4–10.5)
Chloride: 102 mEq/L (ref 96–112)
Creatinine, Ser: 0.79 mg/dL (ref 0.50–1.10)
Glucose, Bld: 332 mg/dL — ABNORMAL HIGH (ref 70–99)
Potassium: 4.1 mEq/L (ref 3.5–5.1)
Sodium: 135 mEq/L (ref 135–145)

## 2013-10-20 MED ORDER — LEVOFLOXACIN 750 MG PO TABS: 750.0000 mg | ORAL_TABLET | Freq: Every day | ORAL | Status: DC

## 2013-10-20 MED ORDER — PREDNISONE 10 MG PO TABS: 10.0000 mg | ORAL_TABLET | Freq: Every day | ORAL | Status: DC

## 2013-10-20 MED ORDER — BUDESONIDE-FORMOTEROL FUMARATE 160-4.5 MCG/ACT IN AERO: 2.0000 | INHALATION_SPRAY | Freq: Two times a day (BID) | RESPIRATORY_TRACT | Status: DC

## 2013-10-20 NOTE — Discharge Summary (Signed)
Physician Discharge Summary  [name] [name] NWG:[id number] DOB: 1976-06-14 DOA: 10/17/2013  PCP: Sandrea Hughs, MD  Admit date: 10/17/2013 Discharge date: 10/20/2013  Time spent: 45 minutes  Recommendations for Outpatient Follow-up:  -Advised to follow up with her pulmonologist as scheduled for later this month.   Discharge Diagnoses:  Principal Problem:   Asthma with status asthmaticus Active Problems:   GERD (gastroesophageal reflux disease)   Chronic anemia   Type 2 diabetes mellitus   Tachycardia   Sinusitis, chronic   HTN (hypertension)   Discharge Condition: Stable and improved.  Filed Weights   10/18/13 0032  Weight: 135.444 kg (298 lb 9.6 oz)    History of present illness:  37 year old obese female with history of moderate persistent asthma, diabetes mellitus, chronic sinusitis, iron deficiency anemia who presented with shortness of breath relieved persistent wheezing for past 2 days. She was admitted about 10 days back with similar symptoms off acute asthma exacerbation with hypoxic respiratory failure. Patient also reports subjective fever and cough. She took frequent rounds of nebulizer without much beef. She also reports finishing a course of prednisone due to his back after her recent discharge. In the ED patient was noted to be quite wheezy and tachycardic. She was treated with one dose of IV sodium Medrol, albuterol and Atrovent nebulizer and IV magnesium with minimal improvement in symptoms. Her oxygen sat was improved to 93% on room air alert she was still tachycardic to 120s even after 45 minutes of continuous nebulizer. [facility] asked to admit patient under observation.  Patient reports taking her medications regularly. She uses albuterol and Atrovent nebulizers, albuterol MDI and Symbicort at home. Denies any sick contacts or any specific triggering agents. She reports having asthma symptoms for possible 12 years and has exacerbation symptoms almost daily  1-2 months with frequent ED visits. She denies smoking or being exposed to smoke or fumes. She reports having subjective fevers and chronic sinusitis. She denies any headache, blurred vision, chest pain, palpitations, nausea, vomiting, abdominal pain, bodyaches, bowel or urinary symptoms. Patient has received flu vaccine during the season. Denies recent travel. We were asked to admit her for further evaluation and management.   Hospital Course:   Asthma  -Much improved today. -Feels ready to go home. -Continue symbicort, albuterol, slow prednisone taper. -Has follow up with pulmonary for later this month.   Tachycardia  -Sinus.  -Related to acute asthma and use of B-agonists.  -Resolved.  Dm 2  -CBGs remained elevated while in the hospital, suspect 2/2 steroid dose. -Should start to improve as steroids are tapered down.  GERD  -Continue ranitidine.   AOCD  -Continue iron supplementation.   HTN  -Well controlled.   Procedures:  None   Consultations:  None  Discharge Instructions  Discharge Orders   Future Appointments Provider Department Dept Phone   11/01/2013 9:45 AM Nyoka Cowden, MD [facility] [phone]   Future Orders Complete By Expires   Discontinue IV  As directed    Increase activity slowly  As directed        Medication List    STOP taking these medications       azithromycin 250 MG tablet  Commonly known as:  ZITHROMAX      TAKE these medications       albuterol 108 (90 BASE) MCG/ACT inhaler  Commonly known as:  PROVENTIL HFA;VENTOLIN HFA  Inhale 2 puffs into the lungs every 4 (four) hours as needed for wheezing or shortness of breath.  budesonide-formoterol 160-4.5 MCG/ACT inhaler  Commonly known as:  SYMBICORT  Inhale 2 puffs into the lungs 2 (two) times daily.     diphenhydrAMINE 25 mg capsule  Commonly known as:  BENADRYL  Take 25 mg by mouth daily as needed for allergies.     ferrous gluconate 216 MG tablet   Commonly known as:  FERGON  Take 1 tablet (216 mg total) by mouth 3 (three) times daily with meals. Start in 1 week     fluticasone 50 MCG/ACT nasal spray  Commonly known as:  FLONASE  Place 2 sprays into the nose daily.     folic acid 400 MCG tablet  Commonly known as:  FOLVITE  Take 400 mcg by mouth every morning.     ipratropium-albuterol 0.5-2.5 (3) MG/3ML Soln  Commonly known as:  DUONEB  Take 3 mLs by nebulization every 4 (four) hours as needed (for wheezing or shortness of breath).     levofloxacin 750 MG tablet  Commonly known as:  LEVAQUIN  Take 1 tablet (750 mg total) by mouth at bedtime. For 5 more days     predniSONE 10 MG tablet  Commonly known as:  DELTASONE  Take 1 tablet (10 mg total) by mouth daily with breakfast. Take 6 tablets for 3 days, decrease by 1 tablet every 3 days until no more left.     ranitidine 150 MG capsule  Commonly known as:  ZANTAC  Take 150 mg by mouth daily. For heartburn       Allergies  Allergen Reactions  . Aspirin Anaphylaxis  . Aspirin Anaphylaxis  . Ibuprofen Anaphylaxis  . Shellfish Allergy Hives and Swelling    Facial and oral swelling  . Menthol Swelling    Tongue swelling  . Other Other (See Comments)    Plastic thermometer covers cause sloughing off of skin on tongue and inside mouth       Follow-up Information   Follow up with Sandrea Hughs, MD. Schedule an appointment as soon as possible for a visit in 2 weeks.   Specialty:  Pulmonary Disease   Contact information:   [address] [place] [address] [phone]        The results of significant diagnostics from this hospitalization (including imaging, microbiology, ancillary and laboratory) are listed below for reference.    Significant Diagnostic Studies: Dg Chest 2 View  10/02/2013   CLINICAL DATA:  Cough  EXAM: CHEST  2 VIEW  COMPARISON:  Chest radiograph 08/24/2013.  FINDINGS: Stable cardiac and mediastinal contours. No consolidative pulmonary  opacities. No pleural effusion or pneumothorax. Regional skeleton is unremarkable.  IMPRESSION: No acute cardiopulmonary process.   Electronically Signed   By: Annia Belt M.D.   On: 10/02/2013 18:21   Dg Chest Port 1 View  10/17/2013   CLINICAL DATA:  Shortness of breath, hypertension, asthma  EXAM: PORTABLE CHEST - 1 VIEW  COMPARISON:  10/02/2013  FINDINGS: Slight rotation to the left. Normal heart size and vascularity. No focal pneumonia, collapse or consolidation. No effusion or pneumothorax. No osseous abnormality.  IMPRESSION: No acute chest findings.  Stable exam.   Electronically Signed   By: Ruel Favors M.D.   On: 10/17/2013 16:48    Microbiology: No results found for this or any previous visit (from the past 240 hour(s)).   Labs: Basic Metabolic Panel:  Recent Labs Lab 10/17/13 2022 10/19/13 0500 10/20/13 0418  NA 137 136 135  K 3.2* 4.1 4.1  CL 103 105 102  CO2 19 17* 22  GLUCOSE 235* 303* 332*  BUN 8 16 18   CREATININE 0.76 0.78 0.79  CALCIUM 9.2 9.9 9.0  MG 2.0  --   --    Liver Function Tests: No results found for this basename: AST, ALT, ALKPHOS, BILITOT, PROT, ALBUMIN,  in the last 168 hours No results found for this basename: LIPASE, AMYLASE,  in the last 168 hours No results found for this basename: AMMONIA,  in the last 168 hours CBC:  Recent Labs Lab 10/19/13 0500 10/20/13 0418  WBC 18.9* 11.6*  HGB 9.1* 8.6*  HCT 30.2* 29.2*  MCV 67.6* 68.7*  PLT 337 330   Cardiac Enzymes: No results found for this basename: CKTOTAL, CKMB, CKMBINDEX, TROPONINI,  in the last 168 hours BNP: BNP (last 3 results)  Recent Labs  05/28/13 1821  PROBNP 56.7   CBG:  Recent Labs Lab 10/19/13 1202 10/19/13 1704 10/19/13 2111 10/20/13 0803 10/20/13 1142  GLUCAP 297* 240* 188* 316* 351*       Signed:  HERNANDEZ ACOSTA,ESTELA  [facility] Pager: [phone] 10/20/2013, 1:46 PM

## 2013-10-20 NOTE — Progress Notes (Signed)
Discharge to home, alert and oriented, no complaints of any pain or discomfort,not in distress. PIV removed no s/s of infiltration or swelling. D/c instructions done and discussed with patient.

## 2013-11-01 ENCOUNTER — Institutional Professional Consult (permissible substitution): Payer: Self-pay | Admitting: Internal Medicine

## 2013-11-01 ENCOUNTER — Ambulatory Visit (INDEPENDENT_AMBULATORY_CARE_PROVIDER_SITE_OTHER): Payer: Medicare Other | Admitting: Internal Medicine

## 2013-11-01 ENCOUNTER — Encounter: Payer: Self-pay | Admitting: Internal Medicine

## 2013-11-01 VITALS — BP 148/92 | HR 122 | Temp 99.7°F | Ht 65.0 in | Wt 300.0 lb

## 2013-11-01 DIAGNOSIS — J339 Nasal polyp, unspecified: Secondary | ICD-10-CM

## 2013-11-01 DIAGNOSIS — J45909 Unspecified asthma, uncomplicated: Secondary | ICD-10-CM

## 2013-11-01 DIAGNOSIS — T39095A Adverse effect of salicylates, initial encounter: Secondary | ICD-10-CM

## 2013-11-01 MED ORDER — PREDNISONE (PAK) 10 MG PO TABS: ORAL_TABLET | ORAL | Status: DC

## 2013-11-01 MED ORDER — PANTOPRAZOLE SODIUM 40 MG PO TBEC: 40.0000 mg | DELAYED_RELEASE_TABLET | Freq: Every day | ORAL | Status: AC

## 2013-11-01 NOTE — Progress Notes (Addendum)
Subjective:    Patient ID: [name] [name], female    DOB: July 09, 1976   MRN: [id number]  HPI  63 yobf never smoker h/o nasal polyps and aspirin sensitivity  onset persistent asthma during last pregnancy in 2002 with baseline wt 130 but mulitple courses of prednisone caused wt gain to peak of around 340  Referred by [facility] p admit 11/19 /22/2014   to pulmonary 11/01/13 for eval of dtc asthma with very poor baseline hfa technique.   11/01/2013 1st [facility] office visit/ [name] [name] cc sob x 2005 only a week or two better at a time, typcially p prednisone,  never desensitized to asa, with less and less benefit to use of saba's assoc with severe persistent nasal congestion "I have polyps but my breathing is so bad they won't operate" Q who is they ?  A I don't know.  Supposed to be on symbicort 160 2bid but not clear she's using correctly.  Just completed last course prednisone.  No obvious day to day or daytime variabilty or assoc excess or purulent mucus or cp. No unusual exp hx or h/o childhood pna/ asthma or knowledge of premature birth.  Sleeping in recliner most nights due to nasal congestion and wheezing.  Denies any obvious fluctuation of symptoms with weather or environmental changes or other aggravating or alleviating factors except as outlined above   Current Medications, Allergies, Complete Past Medical History, Past Surgical History, Family History, and Social History were reviewed in [employer] record.   .        Review of Systems  Constitutional: Negative for fever, chills and unexpected weight change.  HENT: Positive for congestion, dental problem, ear pain and sneezing. Negative for nosebleeds, postnasal drip, rhinorrhea, sinus pressure, sore throat, trouble swallowing and voice change.   Eyes: Negative for visual disturbance.  Respiratory: Positive for shortness of breath. Negative for cough and choking.   Cardiovascular: Negative for  chest pain and leg swelling.  Gastrointestinal: Negative for vomiting, abdominal pain and diarrhea.  Genitourinary: Negative for difficulty urinating.       Acid Heartburn Indigestion  Musculoskeletal: Negative for arthralgias.  Skin: Negative for rash.  Neurological: Positive for headaches. Negative for tremors and syncope.  Hematological: Does not bruise/bleed easily.       Objective:   Physical Exam  Obese amb bf nad  Wt Readings from Last 3 Encounters:  11/01/13 300 lb (136.079 kg)  10/18/13 298 lb 9.6 oz (135.444 kg)  10/05/13 297 lb 2.9 oz (134.8 kg)     HEENT: nl dentition,  and orophanx. Nl external ear canals without cough reflex Severe bilateral turbinate edema with non-specific features.   NECK :  without JVD/Nodes/TM/ nl carotid upstrokes bilaterally   LUNGS: no acc muscle use,  Late bilateral exp wheeze, min increase Texp (p saba w/in 4 h)   CV:  RRR  no s3 or murmur or increase in P2, no edema   ABD:  soft and nontender with nl excursion in the supine position. No bruits or organomegaly, bowel sounds nl  MS:  warm without deformities, calf tenderness, cyanosis or clubbing  SKIN: warm and dry without lesions    NEURO:  alert, approp, no deficits    cxr 10/17/13  Slight rotation to the left. Normal heart size and vascularity. No  focal pneumonia, collapse or consolidation. No effusion or  pneumothorax. No osseous abnormality.  IMPRESSION:  No acute chest findings. Stable exam.       Assessment &  Plan:

## 2013-11-01 NOTE — Patient Instructions (Signed)
Symbicort 160 Take 2 puffs first thing in am and then another 2 puffs about 12 hours later.   Work on inhaler technique:  relax and gently blow all the way out then take a nice smooth deep breath back in, triggering the inhaler at same time you start breathing in.  Hold for up to 5 seconds if you can.  Rinse and gargle with water when done     Pantoprazole (protonix) 40 mg   Take 30-60 min before first meal of the day and zantac 150 mg  one bedtime until return to office - this is the best way to tell whether stomach acid is contributing to your problem.   GERD (REFLUX)  is an extremely common cause of respiratory symptoms, many times with no significant heartburn at all.    It can be treated with medication, but also with lifestyle changes including avoidance of late meals, excessive alcohol, smoking cessation, and avoid fatty foods, chocolate, peppermint, colas, red wine, and acidic juices such as orange juice.  NO MINT OR MENTHOL PRODUCTS SO NO COUGH DROPS  USE SUGARLESS CANDY INSTEAD (jolley ranchers or Stover's)  NO OIL BASED VITAMINS - use powdered substitutes.  Only use your albuterol (ventolin) as a rescue medication to be used if you can't catch your breath by resting or doing a relaxed purse lip breathing pattern.  - The less you use it, the better it will work when you need it. - Ok to use up to 2 pffs every 4 hours if you must but call for immediate appointment if use goes up over your usual need - Don't leave home without it !!  (think of it like your spare tire for your car)  - only use the nebulizer after you try the ventolin first   Prednisone 10 mg take  4 each am x 2 days,   2 each am x 2 days,  1 each am x 2 days and stop   Please schedule a follow up office visit in 2 weeks, sooner if needed

## 2013-11-03 NOTE — Assessment & Plan Note (Signed)
DDX of  difficult airways managment all start with A and  include Adherence, Ace Inhibitors, Acid Reflux, Active Sinus Disease, Alpha 1 Antitripsin deficiency, Anxiety masquerading as Airways dz,  ABPA,  allergy(esp in young), Aspiration (esp in elderly), Adverse effects of DPI,  Active smokers, plus two Bs  = Bronchiectasis and Beta blocker use..and one C= CHF   In this case Adherence is the biggest issue and starts with  inability to use HFA effectively and also  understand that SABA treats the symptoms but doesn't get to the underlying problem (inflammation).  I used  the analogy of putting steroid cream on a rash to help explain the meaning of topical therapy and the need to get the drug to the target tissue.    The proper method of use, as well as anticipated side effects, of a metered-dose inhaler are discussed and demonstrated to the patient. Improved effectiveness after extensive coaching during this visit to a level of approximately  75% so start symbicort 160 2bid   ? Acid (or non-acid) GERD > always difficult to exclude as up to 75% of pts in some series report no assoc GI/ Heartburn symptoms> rec max (24h)  acid suppression and diet restrictions/ reviewed and instructions given in writting  Aspirin sensitivity noted but apparently already tried on singulair and failed > consider asa desensitization at some point if fails to gain control with conventional rx  See instructions for specific recommendations which were reviewed directly with the patient who was given a copy with highlighter outlining the key components.

## 2013-11-15 ENCOUNTER — Ambulatory Visit: Payer: Self-pay | Admitting: Internal Medicine

## 2013-12-03 ENCOUNTER — Ambulatory Visit: Payer: Self-pay | Admitting: Internal Medicine

## 2013-12-06 ENCOUNTER — Encounter: Payer: Self-pay | Admitting: Internal Medicine

## 2013-12-28 ENCOUNTER — Emergency Department (HOSPITAL_COMMUNITY): Payer: Medicare Other

## 2013-12-28 ENCOUNTER — Encounter (HOSPITAL_COMMUNITY): Payer: Self-pay | Admitting: Emergency Medicine

## 2013-12-28 ENCOUNTER — Inpatient Hospital Stay (HOSPITAL_COMMUNITY)
Admission: EM | Admit: 2013-12-28 | Discharge: 2013-12-30 | DRG: 202 | Disposition: A | Discharge status: Home / Self Care | Payer: Medicare Other | Attending: Internal Medicine | Admitting: Internal Medicine

## 2013-12-28 DIAGNOSIS — Z6841 Body Mass Index (BMI) 40.0 and over, adult: Secondary | ICD-10-CM

## 2013-12-28 DIAGNOSIS — R0602 Shortness of breath: Secondary | ICD-10-CM | POA: Diagnosis present

## 2013-12-28 DIAGNOSIS — R739 Hyperglycemia, unspecified: Secondary | ICD-10-CM

## 2013-12-28 DIAGNOSIS — I10 Essential (primary) hypertension: Secondary | ICD-10-CM | POA: Diagnosis present

## 2013-12-28 DIAGNOSIS — J45901 Unspecified asthma with (acute) exacerbation: Principal | ICD-10-CM | POA: Diagnosis present

## 2013-12-28 DIAGNOSIS — E876 Hypokalemia: Secondary | ICD-10-CM | POA: Diagnosis present

## 2013-12-28 DIAGNOSIS — R7309 Other abnormal glucose: Secondary | ICD-10-CM

## 2013-12-28 DIAGNOSIS — Z8249 Family history of ischemic heart disease and other diseases of the circulatory system: Secondary | ICD-10-CM

## 2013-12-28 DIAGNOSIS — D509 Iron deficiency anemia, unspecified: Secondary | ICD-10-CM | POA: Diagnosis present

## 2013-12-28 DIAGNOSIS — Z833 Family history of diabetes mellitus: Secondary | ICD-10-CM

## 2013-12-28 DIAGNOSIS — K219 Gastro-esophageal reflux disease without esophagitis: Secondary | ICD-10-CM | POA: Diagnosis present

## 2013-12-28 DIAGNOSIS — J45902 Unspecified asthma with status asthmaticus: Secondary | ICD-10-CM | POA: Insufficient documentation

## 2013-12-28 DIAGNOSIS — E119 Type 2 diabetes mellitus without complications: Secondary | ICD-10-CM | POA: Diagnosis present

## 2013-12-28 LAB — CBC WITH DIFFERENTIAL/PLATELET
Basophils Absolute: 0 10*3/uL (ref 0.0–0.1)
Basophils Relative: 0 % (ref 0–1)
EOS ABS: 1.8 10*3/uL — AB (ref 0.0–0.7)
Eosinophils Relative: 15 % — ABNORMAL HIGH (ref 0–5)
HEMATOCRIT: 28.7 % — AB (ref 36.0–46.0)
Hemoglobin: 8.1 g/dL — ABNORMAL LOW (ref 12.0–15.0)
LYMPHS PCT: 24 % (ref 12–46)
Lymphs Abs: 2.9 10*3/uL (ref 0.7–4.0)
MCH: 18.2 pg — ABNORMAL LOW (ref 26.0–34.0)
MCHC: 28.2 g/dL — ABNORMAL LOW (ref 30.0–36.0)
MCV: 64.5 fL — ABNORMAL LOW (ref 78.0–100.0)
MONOS PCT: 11 % (ref 3–12)
Monocytes Absolute: 1.3 10*3/uL — ABNORMAL HIGH (ref 0.1–1.0)
NEUTROS ABS: 6 10*3/uL (ref 1.7–7.7)
NEUTROS PCT: 50 % (ref 43–77)
Platelets: 434 10*3/uL — ABNORMAL HIGH (ref 150–400)
RBC: 4.45 MIL/uL (ref 3.87–5.11)
RDW: 18.5 % — AB (ref 11.5–15.5)
WBC: 12 10*3/uL — AB (ref 4.0–10.5)

## 2013-12-28 LAB — BASIC METABOLIC PANEL
BUN: 7 mg/dL (ref 6–23)
CHLORIDE: 98 meq/L (ref 96–112)
CO2: 23 mEq/L (ref 19–32)
Calcium: 8.7 mg/dL (ref 8.4–10.5)
Creatinine, Ser: 0.92 mg/dL (ref 0.50–1.10)
GFR, EST NON AFRICAN AMERICAN: 78 mL/min — AB (ref >90)
Glucose, Bld: 161 mg/dL — ABNORMAL HIGH (ref 70–99)
POTASSIUM: 3 meq/L — AB (ref 3.7–5.3)
SODIUM: 139 meq/L (ref 137–147)

## 2013-12-28 LAB — POTASSIUM: POTASSIUM: 3.5 meq/L — AB (ref 3.7–5.3)

## 2013-12-28 LAB — INFLUENZA PANEL BY PCR (TYPE A & B)
H1N1FLUPCR: NOT DETECTED
INFLBPCR: NEGATIVE
Influenza A By PCR: NEGATIVE

## 2013-12-28 LAB — GLUCOSE, CAPILLARY: GLUCOSE-CAPILLARY: 418 mg/dL — AB (ref 70–99)

## 2013-12-28 LAB — MAGNESIUM: Magnesium: 1.4 mg/dL — ABNORMAL LOW (ref 1.5–2.5)

## 2013-12-28 LAB — HEMOGLOBIN A1C
Hgb A1c MFr Bld: 7.8 % — ABNORMAL HIGH (ref <5.7)
Mean Plasma Glucose: 177 mg/dL — ABNORMAL HIGH (ref <117)

## 2013-12-28 LAB — MRSA PCR SCREENING: MRSA BY PCR: POSITIVE — AB

## 2013-12-28 LAB — TSH: TSH: 0.419 u[IU]/mL (ref 0.350–4.500)

## 2013-12-28 MED ORDER — BUDESONIDE 0.25 MG/2ML IN SUSP
0.2500 mg | Freq: Two times a day (BID) | RESPIRATORY_TRACT | Status: DC
  Administered 2013-12-28 – 2013-12-30 (×5): 0.25 mg via RESPIRATORY_TRACT
  Filled 2013-12-28 (×10): qty 2

## 2013-12-28 MED ORDER — IPRATROPIUM BROMIDE 0.02 % IN SOLN
RESPIRATORY_TRACT | Status: AC
  Filled 2013-12-28: qty 2.5

## 2013-12-28 MED ORDER — POTASSIUM 95 MG PO TABS: 1.0000 | ORAL_TABLET | Freq: Every day | ORAL | Status: DC

## 2013-12-28 MED ORDER — HYDRALAZINE HCL 20 MG/ML IJ SOLN
10.0000 mg | INTRAMUSCULAR | Status: DC | PRN
  Administered 2013-12-28: 10 mg via INTRAVENOUS
  Filled 2013-12-28: qty 1

## 2013-12-28 MED ORDER — ALBUTEROL (5 MG/ML) CONTINUOUS INHALATION SOLN
INHALATION_SOLUTION | RESPIRATORY_TRACT | Status: AC
  Filled 2013-12-28: qty 20

## 2013-12-28 MED ORDER — ONDANSETRON HCL 4 MG/2ML IJ SOLN: 4.0000 mg | Freq: Four times a day (QID) | INTRAMUSCULAR | Status: DC | PRN

## 2013-12-28 MED ORDER — AMLODIPINE BESYLATE 5 MG PO TABS
5.0000 mg | ORAL_TABLET | Freq: Every day | ORAL | Status: DC
  Administered 2013-12-28 – 2013-12-30 (×3): 5 mg via ORAL
  Filled 2013-12-28 (×3): qty 1

## 2013-12-28 MED ORDER — POTASSIUM GLUCONATE 595 (99 K) MG PO TABS
595.0000 mg | ORAL_TABLET | Freq: Every day | ORAL | Status: DC
  Administered 2013-12-28 – 2013-12-30 (×3): 595 mg via ORAL
  Filled 2013-12-28 (×3): qty 1

## 2013-12-28 MED ORDER — IRBESARTAN 300 MG PO TABS
300.0000 mg | ORAL_TABLET | Freq: Every day | ORAL | Status: DC
  Administered 2013-12-28 – 2013-12-30 (×3): 300 mg via ORAL
  Filled 2013-12-28 (×3): qty 1

## 2013-12-28 MED ORDER — PANTOPRAZOLE SODIUM 40 MG PO TBEC: 40.0000 mg | DELAYED_RELEASE_TABLET | Freq: Every day | ORAL | Status: DC

## 2013-12-28 MED ORDER — LEVOFLOXACIN IN D5W 750 MG/150ML IV SOLN
750.0000 mg | Freq: Every day | INTRAVENOUS | Status: DC
  Administered 2013-12-28 – 2013-12-29 (×2): 750 mg via INTRAVENOUS
  Filled 2013-12-28 (×2): qty 150

## 2013-12-28 MED ORDER — INSULIN GLARGINE 100 UNIT/ML ~~LOC~~ SOLN
5.0000 [IU] | Freq: Every day | SUBCUTANEOUS | Status: DC
  Administered 2013-12-28 – 2013-12-30 (×3): 5 [IU] via SUBCUTANEOUS
  Filled 2013-12-28 (×4): qty 0.05

## 2013-12-28 MED ORDER — ACETAMINOPHEN 325 MG PO TABS: 650.0000 mg | ORAL_TABLET | Freq: Four times a day (QID) | ORAL | Status: DC | PRN

## 2013-12-28 MED ORDER — IPRATROPIUM BROMIDE 0.02 % IN SOLN
0.5000 mg | Freq: Four times a day (QID) | RESPIRATORY_TRACT | Status: DC
  Administered 2013-12-28 – 2013-12-29 (×7): 0.5 mg via RESPIRATORY_TRACT
  Filled 2013-12-28 (×6): qty 2.5

## 2013-12-28 MED ORDER — LEVALBUTEROL HCL 0.63 MG/3ML IN NEBU
INHALATION_SOLUTION | RESPIRATORY_TRACT | Status: AC
  Filled 2013-12-28: qty 3

## 2013-12-28 MED ORDER — FOLIC ACID 1 MG PO TABS
500.0000 ug | ORAL_TABLET | Freq: Every morning | ORAL | Status: DC
  Administered 2013-12-28 – 2013-12-30 (×3): 0.5 mg via ORAL
  Filled 2013-12-28 (×3): qty 0.5

## 2013-12-28 MED ORDER — PREDNISONE 20 MG PO TABS
20.0000 mg | ORAL_TABLET | Freq: Two times a day (BID) | ORAL | Status: DC
  Administered 2013-12-28 – 2013-12-30 (×4): 20 mg via ORAL
  Filled 2013-12-28 (×7): qty 1

## 2013-12-28 MED ORDER — SODIUM CHLORIDE 0.9 % IJ SOLN
3.0000 mL | Freq: Two times a day (BID) | INTRAMUSCULAR | Status: DC
  Administered 2013-12-28 – 2013-12-30 (×3): 3 mL via INTRAVENOUS

## 2013-12-28 MED ORDER — FERROUS GLUCONATE 324 (38 FE) MG PO TABS
324.0000 mg | ORAL_TABLET | Freq: Three times a day (TID) | ORAL | Status: DC
  Administered 2013-12-28 – 2013-12-30 (×6): 324 mg via ORAL
  Filled 2013-12-28 (×9): qty 1

## 2013-12-28 MED ORDER — LEVALBUTEROL HCL 0.63 MG/3ML IN NEBU
0.6300 mg | INHALATION_SOLUTION | Freq: Four times a day (QID) | RESPIRATORY_TRACT | Status: DC
  Administered 2013-12-28 – 2013-12-29 (×7): 0.63 mg via RESPIRATORY_TRACT
  Filled 2013-12-28 (×11): qty 3

## 2013-12-28 MED ORDER — ONDANSETRON HCL 4 MG PO TABS: 4.0000 mg | ORAL_TABLET | Freq: Four times a day (QID) | ORAL | Status: DC | PRN

## 2013-12-28 MED ORDER — INSULIN ASPART 100 UNIT/ML ~~LOC~~ SOLN
3.0000 [IU] | Freq: Three times a day (TID) | SUBCUTANEOUS | Status: DC
  Administered 2013-12-28 – 2013-12-30 (×6): 3 [IU] via SUBCUTANEOUS

## 2013-12-28 MED ORDER — BENZONATATE 100 MG PO CAPS
100.0000 mg | ORAL_CAPSULE | Freq: Three times a day (TID) | ORAL | Status: DC | PRN
  Administered 2013-12-28: 100 mg via ORAL
  Filled 2013-12-28: qty 1

## 2013-12-28 MED ORDER — LEVALBUTEROL HCL 0.63 MG/3ML IN NEBU: 0.6300 mg | INHALATION_SOLUTION | Freq: Four times a day (QID) | RESPIRATORY_TRACT | Status: DC | PRN

## 2013-12-28 MED ORDER — METHYLPREDNISOLONE SODIUM SUCC 125 MG IJ SOLR
INTRAMUSCULAR | Status: AC
  Filled 2013-12-28: qty 2

## 2013-12-28 MED ORDER — ENOXAPARIN SODIUM 80 MG/0.8ML ~~LOC~~ SOLN
70.0000 mg | SUBCUTANEOUS | Status: DC
  Administered 2013-12-28 – 2013-12-29 (×2): 70 mg via SUBCUTANEOUS
  Filled 2013-12-28 (×3): qty 0.8

## 2013-12-28 MED ORDER — INSULIN ASPART 100 UNIT/ML ~~LOC~~ SOLN: 0.0000 [IU] | Freq: Three times a day (TID) | SUBCUTANEOUS | Status: DC

## 2013-12-28 MED ORDER — METHYLPREDNISOLONE SODIUM SUCC 40 MG IJ SOLR
40.0000 mg | Freq: Every day | INTRAMUSCULAR | Status: DC
  Administered 2013-12-28: 40 mg via INTRAVENOUS
  Filled 2013-12-28: qty 1

## 2013-12-28 MED ORDER — PANTOPRAZOLE SODIUM 40 MG PO TBEC
40.0000 mg | DELAYED_RELEASE_TABLET | Freq: Every day | ORAL | Status: DC
  Administered 2013-12-28 – 2013-12-30 (×3): 40 mg via ORAL
  Filled 2013-12-28 (×3): qty 1

## 2013-12-28 MED ORDER — FLUTICASONE PROPIONATE 50 MCG/ACT NA SUSP
2.0000 | Freq: Every day | NASAL | Status: DC
  Administered 2013-12-28 – 2013-12-30 (×3): 2 via NASAL
  Filled 2013-12-28: qty 16

## 2013-12-28 MED ORDER — INSULIN ASPART 100 UNIT/ML ~~LOC~~ SOLN
0.0000 [IU] | Freq: Three times a day (TID) | SUBCUTANEOUS | Status: DC
  Administered 2013-12-28: 15 [IU] via SUBCUTANEOUS
  Administered 2013-12-28: 11 [IU] via SUBCUTANEOUS
  Administered 2013-12-29 (×2): 8 [IU] via SUBCUTANEOUS
  Administered 2013-12-29 – 2013-12-30 (×2): 3 [IU] via SUBCUTANEOUS

## 2013-12-28 MED ORDER — DIPHENHYDRAMINE HCL 25 MG PO CAPS: 25.0000 mg | ORAL_CAPSULE | Freq: Every day | ORAL | Status: DC | PRN

## 2013-12-28 MED ORDER — POTASSIUM CHLORIDE CRYS ER 20 MEQ PO TBCR: 40.0000 meq | EXTENDED_RELEASE_TABLET | Freq: Once | ORAL | Status: DC

## 2013-12-28 MED ORDER — METHYLPREDNISOLONE SODIUM SUCC 125 MG IJ SOLR
125.0000 mg | Freq: Once | INTRAMUSCULAR | Status: AC
  Administered 2013-12-28: 125 mg via INTRAVENOUS

## 2013-12-28 MED ORDER — LEVALBUTEROL HCL 0.63 MG/3ML IN NEBU
0.6300 mg | INHALATION_SOLUTION | RESPIRATORY_TRACT | Status: DC | PRN
  Filled 2013-12-28: qty 3

## 2013-12-28 MED ORDER — ACETAMINOPHEN 650 MG RE SUPP: 650.0000 mg | Freq: Four times a day (QID) | RECTAL | Status: DC | PRN

## 2013-12-28 MED ORDER — INSULIN ASPART 100 UNIT/ML ~~LOC~~ SOLN
0.0000 [IU] | Freq: Every day | SUBCUTANEOUS | Status: DC
  Administered 2013-12-28: 4 [IU] via SUBCUTANEOUS
  Administered 2013-12-29: 2 [IU] via SUBCUTANEOUS

## 2013-12-28 MED ORDER — ALBUTEROL (5 MG/ML) CONTINUOUS INHALATION SOLN
10.0000 mg/h | INHALATION_SOLUTION | Freq: Once | RESPIRATORY_TRACT | Status: AC
  Administered 2013-12-28: 10 mg/h via RESPIRATORY_TRACT

## 2013-12-28 MED ORDER — ALBUTEROL SULFATE (2.5 MG/3ML) 0.083% IN NEBU
5.0000 mg | INHALATION_SOLUTION | Freq: Once | RESPIRATORY_TRACT | Status: AC
  Administered 2013-12-28: 5 mg via RESPIRATORY_TRACT
  Filled 2013-12-28: qty 6

## 2013-12-28 MED ORDER — OSELTAMIVIR PHOSPHATE 75 MG PO CAPS
75.0000 mg | ORAL_CAPSULE | Freq: Two times a day (BID) | ORAL | Status: DC
  Administered 2013-12-28: 75 mg via ORAL
  Filled 2013-12-28 (×2): qty 1

## 2013-12-28 NOTE — ED Notes (Signed)
Patient is alert and oriented x3.  She is complaining shortness of breath with a history of asthma. Patient was given an albuterol x2 with atrovent by EMS.  Patient has upper airway expiatory wheezing PER EMS.

## 2013-12-28 NOTE — Progress Notes (Addendum)
Inpatient Diabetes Program Recommendations  AACE/ADA: New Consensus Statement on Inpatient Glycemic Control (2013)  Target Ranges:  Prepandial:   less than 140 mg/dL      Peak postprandial:   less than 180 mg/dL (1-2 hours)      Critically ill patients:  140 - 180 mg/dL   Reason for Visit: Hyperglycemia  Results for SHALVA, ROZYCKI (MRN [id number]) as of 12/28/2013 12:08  Ref. Range 12/28/2013 11:30  Glucose-Capillary Latest Range: 70-99 mg/dL 418 (H)        Inpatient Diabetes Program Recommendations    Insulin - Basal: Please consider ordering low dose basal insulin if continued on steroids; recommend starting Levemir 10 units Q24H (starting now).  Correction (SSI): Please increase Novolog correction to resistant scale while on steroids. Also, please consider adding Novolog bedtime correction scale. Please add CHO mod med to heart healthy diet. Updated HgbA1C is pending.    Note: Patient has a history of diabetes but does not taken any medications for diabetes management as an outpatient. Currently, patient is ordered to receive Novolog 0-15 units AC for inpatient glycemic control. Patient is ordered Solumedrol 40 mg QD which is contributing to hyperglycemia. Please increase Novolog correction to resistant scale and add Novolog bedtime correction scale. If patient will continue on steroids, please consider ordering low dose basal insulin. Will continue to follow.  Will continue to follow. Thank you. Lorenda Peck, RD, LDN, CDE Inpatient Diabetes Coordinator [phone]

## 2013-12-28 NOTE — Care Management Note (Addendum)
    Page 1 of 1   12/28/2013     4:04:17 PM   CARE MANAGEMENT NOTE 12/28/2013  Patient:  [name] [name],[name] [name]   Account Number:  [id number]  Date Initiated:  12/28/2013  Documentation initiated by:  Dessa Phi  Subjective/Objective Assessment:   38 Y/O F ADMITTED W/SOB.ASTHMA.KG:YJEHUD.     Action/Plan:   FROM HOME.HAS PCP,PHARMACY-[facility]   Anticipated DC Date:  12/30/2013   Anticipated DC Plan:  [place]  CM consult      Choice offered to / List presented to:             Status of service:  In process, will continue to follow Medicare Important Message given?   (If response is "NO", the following Medicare IM given date fields will be blank) Date Medicare IM given:   Date Additional Medicare IM given:    Discharge Disposition:    Per UR Regulation:  Reviewed for med. necessity/level of care/duration of stay  If discussed at Long Length of Stay Meetings, dates discussed:    Comments:  12/28/13 Aronda Burford RN,BSN NCM [phone] BENEFIT CHECKED FOR SYMBICORT-PER INSURANCE CO-PAY $45/30DAY SUPPLY/NO PRE [place].TC [facility]- [facility][phone](MAE),SPOKE TO PHARMACIST WHO SAID WHEN THEY RUN HER INSURANCE CARD IT SHOWS A COST OF $250,THIS IS POSSIBLY HER DEDUCTIBLE BUT I CAN'T CONFIRM THAT.WILL INFORM PATIENT THAT HERE IN THE HOSPITAL WE CAN ONLY TELL HER WHAT INFO WE ARE GIVEN.PATIENT NOW TELLS ME THAT SHE IS AWARE OF THE PHARMACIST & HER STATEMENT FROM BEFORE,& THAT PATIENT HAD TO GO TO THE SUPERVISOR TO GET THE COST OF THE MEDICINE CORRECT.I ADVISED HER TO GO TO THE SUPERVISOR AGAIN OR JUST CHANGE PHARMACIES.PATIENT VOICED UNDERSTANDING.MD NOTIFIED.

## 2013-12-28 NOTE — ED Notes (Signed)
Pt ambulated over 50 feet.   Pt was wheezing loud while working to breath.    Pt was tired, coughing, and diaphoretic with unsteady gait when getting back in bed.   Oxygen remained at 97%.

## 2013-12-28 NOTE — Progress Notes (Signed)
Patient seen and examined today. Agree with H&P.   Barnet Benavides M. Cruzita Lederer, MD [facility] [phone]

## 2013-12-28 NOTE — Progress Notes (Signed)
ANTIBIOTIC CONSULT NOTE - INITIAL  Pharmacy Consult for Levaquin Indication: Bronchitis  Allergies  Allergen Reactions  . Aspirin Anaphylaxis  . Aspirin Anaphylaxis  . Ibuprofen Anaphylaxis  . Shellfish Allergy Hives and Swelling    Facial and oral swelling  . Menthol Swelling    Tongue swelling  . Other Other (See Comments)    Plastic thermometer covers cause sloughing off of skin on tongue and inside mouth    Patient Measurements: Height: 5\' 6"  (167.6 cm) Weight: 306 lb 14.1 oz (139.2 kg) IBW/kg (Calculated) : 59.3 Adjusted Body Weight:   Vital Signs: Temp: 98.3 F (36.8 C) (01/30 0635) Temp src: Axillary (01/30 0635) BP: 155/103 mmHg (01/30 0635) Pulse Rate: 131 (01/30 0635) Intake/Output from previous day:   Intake/Output from this shift:    Labs:  Recent Labs  12/28/13 0130  WBC 12.0*  HGB 8.1*  PLT 434*  CREATININE 0.92   Estimated Creatinine Clearance: 119.5 ml/min (by C-G formula based on Cr of 0.92). No results found for this basename: VANCOTROUGH, VANCOPEAK, VANCORANDOM, GENTTROUGH, GENTPEAK, GENTRANDOM, TOBRATROUGH, TOBRAPEAK, TOBRARND, AMIKACINPEAK, AMIKACINTROU, AMIKACIN,  in the last 72 hours   Microbiology: No results found for this or any previous visit (from the past 720 hour(s)).  Medical History: Past Medical History  Diagnosis Date  . Asthma   . Hypertension   . Chronic anemia   . Abnormal TSH   . Morbid obesity   . GERD (gastroesophageal reflux disease)   . Seasonal allergies   . Nasal polyps   . Diabetes mellitus     type 2  . Chronic bronchitis   . Sleep apnea   . Shortness of breath 12/14/11    "related to how bad my asthma is; sometimes @ rest, lying down, w/exertion"  . Pneumonia   . Blood transfusion   . Inguinal hernia unilateral, non-recurrent     right  . Kidney infection 2008  . Headache(784.0)   . Anxiety   . Depression   . PTSD (post-traumatic stress disorder)     "related to prior hospitalizations; things  that happened when I was in hospital"  . Asthma     Assessment: 13 yoF with PMHx asthma and chronic bronchitis presents to ED with SOB and wheezing.  Pharmacy consulted to dose levaquin for acute bronchitis and asthma exacerbation.      D1 Abx 1/30 >> Levaquin >>  Tm24h: Afebrile WBC: Slightly elevated 12, steroids started Renal: SCr 0.92, CrCl >100  Micro: None  Goal of Therapy:  Eradication of Infection  Plan:  Levaquin 750mg  IV q 24 hours.    Ralene Bathe, PharmD, BCPS 12/28/2013, 8:52 AM  Pager: [phone]

## 2013-12-28 NOTE — ED Provider Notes (Signed)
CSN: [id number]     Arrival date & time 12/28/13  0102 History   First MD Initiated Contact with Patient 12/28/13 [phone]     Chief Complaint  Patient presents with  . Asthma   HPI  History provided by the patient. Patient is a 38 year old African-American female with history of hypertension, diabetes, morbid obesity, and asthma who presents with complaints of shortness of breath and asthma symptoms. Patient reports having symptoms for the past 3 days it has been worsening. She has been using her home inhalers and medications regularly without any improvement. She reports having frequent bad asthma attacks with multiple hospitalizations every year. Her last asthma attack was in November and she was hospitalized for several days. Today she reports having some associated fevers, productive cough of yellow and green phlegm as well as some body aches. She denies having any chest pain or hemoptysis. She also has has a few occasions of posttussive emesis. She has been able to eat and drink fluids. Denies any diarrhea. No other aggravating or alleviating factors. No other associated symptoms.   Past Medical History  Diagnosis Date  . Asthma   . Hypertension   . Chronic anemia   . Abnormal TSH   . Morbid obesity   . GERD (gastroesophageal reflux disease)   . Seasonal allergies   . Nasal polyps   . Diabetes mellitus     type 2  . Chronic bronchitis   . Sleep apnea   . Shortness of breath 12/14/11    "related to how bad my asthma is; sometimes @ rest, lying down, w/exertion"  . Pneumonia   . Blood transfusion   . Inguinal hernia unilateral, non-recurrent     right  . Kidney infection 2008  . Headache(784.0)   . Anxiety   . Depression   . PTSD (post-traumatic stress disorder)     "related to prior hospitalizations; things that happened when I was in hospital"  . Asthma    Past Surgical History  Procedure Laterality Date  . Cervical cerclage  1999; 2002  . Tubal ligation  2002  . Cervical  cerclage    . Tubal ligation     Family History  Problem Relation Age of Onset  . Diabetes Mother   . Heart disease Father   . Heart disease Sister    History  Substance Use Topics  . Smoking status: Never Smoker   . Smokeless tobacco: Never Used  . Alcohol Use: No     Comment: 12/14/11 "only on birthdays; parties, and such"   OB History   Grav Para Term Preterm Abortions TAB SAB Ect Mult Living                 Review of Systems  Constitutional: Positive for fever and chills. Negative for appetite change.  HENT: Positive for congestion and rhinorrhea.   Respiratory: Positive for cough, shortness of breath and wheezing.   Cardiovascular: Negative for chest pain.  Gastrointestinal: Positive for vomiting. Negative for diarrhea.  All other systems reviewed and are negative.    Allergies  Aspirin; Aspirin; Ibuprofen; Shellfish allergy; Menthol; and Other  Home Medications   Current Outpatient Rx  Name  Route  Sig  Dispense  Refill  . albuterol (PROVENTIL HFA;VENTOLIN HFA) 108 (90 BASE) MCG/ACT inhaler   Inhalation   Inhale 2 puffs into the lungs every 4 (four) hours as needed for wheezing or shortness of breath.   1 Inhaler   11   . budesonide-formoterol (SYMBICORT)  160-4.5 MCG/ACT inhaler   Inhalation   Inhale 2 puffs into the lungs 2 (two) times daily.   1 Inhaler   12   . diphenhydrAMINE (BENADRYL) 25 mg capsule   Oral   Take 25 mg by mouth daily as needed for allergies.          . ferrous gluconate (FERGON) 216 MG tablet   Oral   Take 1 tablet (216 mg total) by mouth 3 (three) times daily with meals. Start in 1 week   60 tablet   0   . fluticasone (FLONASE) 50 MCG/ACT nasal spray   Nasal   Place 2 sprays into the nose daily.   16 g   2   . folic acid (FOLVITE) 829 MCG tablet   Oral   Take 400 mcg by mouth every morning.          Marland Kitchen ipratropium-albuterol (DUONEB) 0.5-2.5 (3) MG/3ML SOLN   Nebulization   Take 3 mLs by nebulization every 4 (four)  hours as needed (for wheezing or shortness of breath).   360 mL   11   . pantoprazole (PROTONIX) 40 MG tablet   Oral   Take 1 tablet (40 mg total) by mouth daily. Take 30-60 min before first meal of the day   30 tablet   2   . predniSONE (STERAPRED UNI-PAK) 10 MG tablet      Prednisone 10 mg take  4 each am x 2 days,   2 each am x 2 days,  1 each am x2days and stop   14 tablet   0   . ranitidine (ZANTAC) 150 MG capsule   Oral   Take 150 mg by mouth daily. For heartburn          There were no vitals taken for this visit. Physical Exam  Nursing note and vitals reviewed. Constitutional: She is oriented to person, place, and time. She appears well-developed and well-nourished. No distress.  HENT:  Head: Normocephalic.  Neck: Normal range of motion. Neck supple.  No meningeal signs.  Cardiovascular: Regular rhythm.  Tachycardia present.   Pulmonary/Chest: No accessory muscle usage. Tachypnea noted. No respiratory distress. She has wheezes. She has no rales.  Diffuse expiratory and inspiratory wheezing. Slight tachypnea but is able to answer questions in full sentence.  Abdominal: Soft. There is no tenderness.  Musculoskeletal: Normal range of motion.  Neurological: She is alert and oriented to person, place, and time.  Skin: Skin is warm and dry. No rash noted.  Psychiatric: She has a normal mood and affect. Her behavior is normal.    ED Course  Procedures  DIAGNOSTIC STUDIES: Oxygen Saturation is 95% on 2 L.    COORDINATION OF CARE:  Nursing notes reviewed. Vital signs reviewed. Initial pt interview and examination performed.   1:15 AM patient seen and evaluated. Patient currently receiving a second albuterol breathing treatment. Slight tachypnea. Diffuse wheezing.  Patient reports requiring admission multiple times in the past. Discussed work up plan with pt at bedside, which includes testing and chest x-ray. Pt agrees with plan.  3:15AM patient reports having some  improvement of shortness of breath and wheezing. She is tachycardic following albuterol treatment. Respirations are normal. O2 sats around 95-96% on 2 L.  5:00 AM patient continues to be short of breath with diffuse wheezing despite several treatments and hour-long bed. She becomes even more shorter breath with activity and walking. She does maintain good O2 sats with nasal cannula at 2 L. At  this point we'll plan for admission for continued treatment.  5:25 AM spoke with [facility].  They will see patient and admit. He would like an EKG for patient's tachycardia holding orders for telemetry bed.    Treatment plan initiated: Medications  potassium chloride SA (K-DUR,KLOR-CON) CR tablet 40 mEq (not administered)  methylPREDNISolone sodium succinate (SOLU-MEDROL) 125 mg/2 mL injection 125 mg (125 mg Intravenous Given 12/28/13 0144)  albuterol (PROVENTIL,VENTOLIN) solution continuous neb (0 mg/hr Nebulization Duplicate [id number])   Results for orders placed during the hospital encounter of 12/28/13  CBC WITH DIFFERENTIAL      Result Value Range   WBC 12.0 (*) 4.0 - 10.5 K/uL   RBC 4.45  3.87 - 5.11 MIL/uL   Hemoglobin 8.1 (*) 12.0 - 15.0 g/dL   HCT 28.7 (*) 36.0 - 46.0 %   MCV 64.5 (*) 78.0 - 100.0 fL   MCH 18.2 (*) 26.0 - 34.0 pg   MCHC 28.2 (*) 30.0 - 36.0 g/dL   RDW 18.5 (*) 11.5 - 15.5 %   Platelets 434 (*) 150 - 400 K/uL   Neutrophils Relative % 50  43 - 77 %   Lymphocytes Relative 24  12 - 46 %   Monocytes Relative 11  3 - 12 %   Eosinophils Relative 15 (*) 0 - 5 %   Basophils Relative 0  0 - 1 %   Neutro Abs 6.0  1.7 - 7.7 K/uL   Lymphs Abs 2.9  0.7 - 4.0 K/uL   Monocytes Absolute 1.3 (*) 0.1 - 1.0 K/uL   Eosinophils Absolute 1.8 (*) 0.0 - 0.7 K/uL   Basophils Absolute 0.0  0.0 - 0.1 K/uL   RBC Morphology ELLIPTOCYTES    BASIC METABOLIC PANEL      Result Value Range   Sodium 139  137 - 147 mEq/L   Potassium 3.0 (*) 3.7 - 5.3 mEq/L   Chloride 98  96 - 112 mEq/L    CO2 23  19 - 32 mEq/L   Glucose, Bld 161 (*) 70 - 99 mg/dL   BUN 7  6 - 23 mg/dL   Creatinine, Ser 0.92  0.50 - 1.10 mg/dL   Calcium 8.7  8.4 - 10.5 mg/dL   GFR calc non Af Amer 78 (*) >90 mL/min   GFR calc Af Amer >90  >90 mL/min      Imaging Review Dg Chest 2 View  12/28/2013   CLINICAL DATA:  Asthma, shortness of breath, fever.  EXAM: CHEST  2 VIEW  COMPARISON:  10/17/2013, 12/20/2012, 02/08/2013  FINDINGS: Cardiomediastinal contours are within normal range. Mild central peribronchial thickening. No confluent airspace opacity, pleural effusion, or pneumothorax. Normal expansion. No acute osseous finding.  IMPRESSION: Mild central peribronchial thickening is similar to priors; favor chronic bronchitic change secondary to reactive airway disease given the stated history. No focal consolidation.   Electronically Signed   By: Carlos Levering M.D.   On: 12/28/2013 02:43    EKG Interpretation   None       MDM   1. Status asthmaticus        Martie Lee, [place] 12/28/13 [phone]

## 2013-12-28 NOTE — H&P (Signed)
[facility] History and Physical  [name] [name] [id number] DOB: 11-13-1976 DOA: 12/28/2013  Referring physician: ER physician. PCP: No PCP Per Patient   Chief Complaint: Shortness of breath.  HPI: [name] [name] is a 38 y.o. female with history of gout last one hypertension, presents to the ER because of worsening shortness of breath. Patient has been having shortness of breath or productive cough for last 4 days. Denies any definite fever but had subjective feeling of chills. Denies chest pain. Patient in the ER was found to be wheezing and was given hour-long nebulizer despite which patient is still feeling tight to breathe. Patient has been admitted for asthma exacerbation. Patient otherwise denies any nausea vomiting abdominal pain diarrhea.    Review of Systems: As presented in the history of presenting illness, rest negative.  Past Medical History  Diagnosis Date  . Asthma   . Hypertension   . Chronic anemia   . Abnormal TSH   . Morbid obesity   . GERD (gastroesophageal reflux disease)   . Seasonal allergies   . Nasal polyps   . Diabetes mellitus     type 2  . Chronic bronchitis   . Sleep apnea   . Shortness of breath 12/14/11    "related to how bad my asthma is; sometimes @ rest, lying down, w/exertion"  . Pneumonia   . Blood transfusion   . Inguinal hernia unilateral, non-recurrent     right  . Kidney infection 2008  . Headache(784.0)   . Anxiety   . Depression   . PTSD (post-traumatic stress disorder)     "related to prior hospitalizations; things that happened when I was in hospital"  . Asthma    Past Surgical History  Procedure Laterality Date  . Cervical cerclage  1999; 2002  . Tubal ligation  2002  . Cervical cerclage    . Tubal ligation     Social History:  reports that she has never smoked. She has never used smokeless tobacco. She reports that she does not drink alcohol or use illicit drugs. Where does patient live home. Can patient  participate in ADLs? Yes.  Allergies  Allergen Reactions  . Aspirin Anaphylaxis  . Aspirin Anaphylaxis  . Ibuprofen Anaphylaxis  . Shellfish Allergy Hives and Swelling    Facial and oral swelling  . Menthol Swelling    Tongue swelling  . Other Other (See Comments)    Plastic thermometer covers cause sloughing off of skin on tongue and inside mouth    Family History:  Family History  Problem Relation Age of Onset  . Diabetes Mother   . Heart disease Father   . Heart disease Sister       Prior to Admission medications   Medication Sig Start Date End Date Taking? Authorizing Provider  albuterol (PROVENTIL HFA;VENTOLIN HFA) 108 (90 BASE) MCG/ACT inhaler Inhale 2 puffs into the lungs every 4 (four) hours as needed for wheezing or shortness of breath. 10/06/13  Yes Theodis Blaze, MD  budesonide-formoterol [facility] 2) 160-4.5 MCG/ACT inhaler Inhale 2 puffs into the lungs 2 (two) times daily. 10/20/13  Yes Erline Hau, MD  diphenhydrAMINE (BENADRYL) 25 mg capsule Take 25 mg by mouth daily as needed for allergies.    Yes Historical Provider, MD  ferrous gluconate (FERGON) 216 MG tablet Take 1 tablet (216 mg total) by mouth 3 (three) times daily with meals. Start in 1 week 08/26/13  Yes Domenic Polite, MD  fluticasone [facility]) 50 MCG/ACT nasal spray Place 2  sprays into the nose daily. 05/30/13  Yes Erline Hau, MD  folic acid (FOLVITE) 989 MCG tablet Take 400 mcg by mouth every morning.    Yes Historical Provider, MD  ipratropium-albuterol (DUONEB) 0.5-2.5 (3) MG/3ML SOLN Take 3 mLs by nebulization every 4 (four) hours as needed (for wheezing or shortness of breath). 10/06/13  Yes Theodis Blaze, MD  pantoprazole (PROTONIX) 40 MG tablet Take 1 tablet (40 mg total) by mouth daily. Take 30-60 min before first meal of the day 11/01/13  Yes Tanda Rockers, MD  Potassium 95 MG TABS Take 1 tablet by mouth daily.   Yes Historical Provider, MD    Physical Exam: Filed Vitals:    12/28/13 0225 12/28/13 0254 12/28/13 0515 12/28/13 0635  BP:  144/80  155/103  Pulse: 138 127  131  Temp:  98.7 F (37.1 C)  98.3 F (36.8 C)  TempSrc:  Oral  Axillary  Resp: 24 18  20   Height:    5\' 6"  (1.676 m)  SpO2: 95% 95% 98% 100%     General:  Well-developed and nourished.  Eyes: Anicteric no pallor.  ENT: No discharge from the ears eyes nose mouth.  Neck: No mass felt.  Cardiovascular: S1-S2 heard.  Respiratory: Bilateral expiratory wheezes no crepitations.  Abdomen: Soft nontender bowel sounds present.  Skin: No rash.  Musculoskeletal: No edema.  Psychiatric: Appears normal.  Neurologic: Alert awake oriented to time place and person. Moves all extremities.  Labs on Admission:  Basic Metabolic Panel:  Recent Labs Lab 12/28/13 0130  NA 139  K 3.0*  CL 98  CO2 23  GLUCOSE 161*  BUN 7  CREATININE 0.92  CALCIUM 8.7   Liver Function Tests: No results found for this basename: AST, ALT, ALKPHOS, BILITOT, PROT, ALBUMIN,  in the last 168 hours No results found for this basename: LIPASE, AMYLASE,  in the last 168 hours No results found for this basename: AMMONIA,  in the last 168 hours CBC:  Recent Labs Lab 12/28/13 0130  WBC 12.0*  NEUTROABS 6.0  HGB 8.1*  HCT 28.7*  MCV 64.5*  PLT 434*   Cardiac Enzymes: No results found for this basename: CKTOTAL, CKMB, CKMBINDEX, TROPONINI,  in the last 168 hours  BNP (last 3 results)  Recent Labs  05/28/13 1821  PROBNP 56.7   CBG: No results found for this basename: GLUCAP,  in the last 168 hours  Radiological Exams on Admission: Dg Chest 2 View  12/28/2013   CLINICAL DATA:  Asthma, shortness of breath, fever.  EXAM: CHEST  2 VIEW  COMPARISON:  10/17/2013, 12/20/2012, 02/08/2013  FINDINGS: Cardiomediastinal contours are within normal range. Mild central peribronchial thickening. No confluent airspace opacity, pleural effusion, or pneumothorax. Normal expansion. No acute osseous finding.   IMPRESSION: Mild central peribronchial thickening is similar to priors; favor chronic bronchitic change secondary to reactive airway disease given the stated history. No focal consolidation.   Electronically Signed   By: Carlos Levering M.D.   On: 12/28/2013 02:43     Assessment/Plan Principal Problem:   Acute asthma exacerbation Active Problems:   HTN (hypertension), malignant   Hyperglycemia   Hypokalemia   Asthma exacerbation   1. Acute asthma exacerbation - patient is unable to talk complete sentences without difficulty. Since patient also has productive cough I have placed patient on empiric antibiotics and will continue with IV steroids Pulmicort and nebulizer. I have chosen to Xopenex because of patient's sinus tachycardia. Check influenza PCR. Until  then we will add Tamiflu. 2. Hypertension uncontrolled - patient states she was not taking Avalide for some time as she ran out of the prescription. I have added Irbesartan 300 mg which he takes at home. Closely follow blood pressure trends. 3. Hyperglycemia - patient states she gets hyperglycemia only when she uses steroids otherwise she is not diabetic. Check hemoglobin A1c and I have placed patient on insulin sliding-scale. 4. Hypokalemia - patient did receive pressure more in the ER. Recheck potassium levels. Check magnesium. 5. Chronic anemia, iron deficiency - continue iron.  I have reviewed patient's old charts and labs.  Code Status: Full code.  Family Communication: None.  Disposition Plan: Admit to inpatient.    KAKRAKANDY,ARSHAD N. [facility] Pager [phone].  If 7PM-7AM, please contact night-coverage [email or web address] Password [email or web address] 12/28/2013, 6:50 AM

## 2013-12-28 NOTE — ED Provider Notes (Signed)
Medical screening examination/treatment/procedure(s) were conducted as a shared visit with non-physician practitioner(s) and myself.  I personally evaluated the patient during the encounter.  Presents with shortness of breath, wheezing history of asthma. Lung sounds with inspiratory and expiratory wheezes and increased work of breathing.  Steroids and multiple breathing treatments provided. Symptoms not improving in the ED.   MED admit for asthma exacerbation  Teressa Lower, MD 12/28/13 [phone]

## 2013-12-29 DIAGNOSIS — E119 Type 2 diabetes mellitus without complications: Secondary | ICD-10-CM | POA: Diagnosis present

## 2013-12-29 LAB — BASIC METABOLIC PANEL
BUN: 8 mg/dL (ref 6–23)
CALCIUM: 9 mg/dL (ref 8.4–10.5)
CO2: 23 mEq/L (ref 19–32)
CREATININE: 0.7 mg/dL (ref 0.50–1.10)
Chloride: 105 mEq/L (ref 96–112)
GFR calc Af Amer: >90 mL/min (ref >90)
Glucose, Bld: 249 mg/dL — ABNORMAL HIGH (ref 70–99)
Potassium: 3.8 mEq/L (ref 3.7–5.3)
Sodium: 140 mEq/L (ref 137–147)

## 2013-12-29 LAB — GLUCOSE, CAPILLARY
GLUCOSE-CAPILLARY: 316 mg/dL — AB (ref 70–99)
GLUCOSE-CAPILLARY: 329 mg/dL — AB (ref 70–99)
GLUCOSE-CAPILLARY: 233 mg/dL — AB (ref 70–99)
GLUCOSE-CAPILLARY: 193 mg/dL — AB (ref 70–99)
Glucose-Capillary: 343 mg/dL — ABNORMAL HIGH (ref 70–99)
Glucose-Capillary: 259 mg/dL — ABNORMAL HIGH (ref 70–99)

## 2013-12-29 LAB — ABO/RH: ABO/RH(D): O POS

## 2013-12-29 LAB — PREGNANCY, URINE: Preg Test, Ur: NEGATIVE

## 2013-12-29 LAB — PREPARE RBC (CROSSMATCH)

## 2013-12-29 LAB — CBC
HCT: 25.8 % — ABNORMAL LOW (ref 36.0–46.0)
HEMOGLOBIN: 7.1 g/dL — AB (ref 12.0–15.0)
MCH: 17.9 pg — ABNORMAL LOW (ref 26.0–34.0)
MCHC: 27.5 g/dL — ABNORMAL LOW (ref 30.0–36.0)
MCV: 65 fL — ABNORMAL LOW (ref 78.0–100.0)
PLATELETS: 331 10*3/uL (ref 150–400)
RBC: 3.97 MIL/uL (ref 3.87–5.11)
RDW: 18.3 % — ABNORMAL HIGH (ref 11.5–15.5)
WBC: 13.5 10*3/uL — AB (ref 4.0–10.5)

## 2013-12-29 LAB — IRON AND TIBC
IRON: 25 ug/dL — AB (ref 42–135)
Saturation Ratios: 6 % — ABNORMAL LOW (ref 20–55)
TIBC: 415 ug/dL (ref 250–470)
UIBC: 390 ug/dL (ref 125–400)

## 2013-12-29 LAB — FERRITIN: FERRITIN: 5 ng/mL — AB (ref 10–291)

## 2013-12-29 MED ORDER — LEVOFLOXACIN 750 MG PO TABS
750.0000 mg | ORAL_TABLET | Freq: Every day | ORAL | Status: DC
  Administered 2013-12-30: 750 mg via ORAL
  Filled 2013-12-29: qty 1

## 2013-12-29 MED ORDER — FUROSEMIDE 10 MG/ML IJ SOLN
20.0000 mg | Freq: Once | INTRAMUSCULAR | Status: AC
  Administered 2013-12-29: 20 mg via INTRAVENOUS
  Filled 2013-12-29: qty 2

## 2013-12-29 MED ORDER — GUAIFENESIN 100 MG/5ML PO SOLN
5.0000 mL | ORAL | Status: DC | PRN
  Administered 2013-12-29 – 2013-12-30 (×3): 100 mg via ORAL
  Filled 2013-12-29 (×3): qty 10

## 2013-12-29 NOTE — Progress Notes (Signed)
PROGRESS NOTE  [name] [name] SAY:[id number] DOB: 05-11-1976 DOA: 12/28/2013 PCP: No PCP Per Patient  Assessment/Plan: Acute asthma exacerbation - improving, but with persistent minimal wheezing today.  Hypertension uncontrolled - patient states she was not taking Avalide for some time as she ran out of the prescription. I have added Irbesartan 300 mg which he takes at home. Added Norvasc due to persistent hypertension.  Hyperglycemia due to newly diagnosed DM - patient states she gets hyperglycemia only when she uses steroids otherwise she is not diabetic. A1C 7.8 however which is placing her into DM range.  Chronic anemia, iron deficiency - continue iron, however patient is not taking iron supplements at home. She only takes folic acid. Her Hb this morning is 7.1, no bleeding, will transfuse 1U. Recheck iron studies. Her anemia is likely due to heavy menses and history of fibroids. At times her bleeding is so severe that she cannot leave her home for 1-2 days.    Diet: diabetic Fluids: none DVT Prophylaxis: Lovenox  Code Status: Full Family Communication: none  Disposition Plan: home when ready, 1-2 days  Consultants:  none  Procedures:  none   Antibiotics Levofloxacin 1/30 >>  HPI/Subjective: - feeling better, breathing better.   Objective: Filed Vitals:   12/28/13 2040 12/29/13 0028 12/29/13 0508 12/29/13 0812  BP: 132/80  118/76   Pulse: 110  104   Temp: 98.2 F (36.8 C)  97.7 F (36.5 C)   TempSrc: Oral  Oral   Resp: 20  20   Height:      Weight:      SpO2: 100% 93% 100% 100%    Intake/Output Summary (Last 24 hours) at 12/29/13 1258 Last data filed at 12/29/13 1037  Gross per 24 hour  Intake    486 ml  Output    300 ml  Net    186 ml   Filed Weights   12/28/13 0635  Weight: 139.2 kg (306 lb 14.1 oz)    Exam:  General:  NAD  Cardiovascular: regular rate and rhythm, without MRG  Respiratory: good air movement, minimal diffuse expiratory  wheezing  Abdomen: soft, not tender to palpation, positive bowel sounds  MSK: no peripheral edema  Neuro: non focal  Data Reviewed: Basic Metabolic Panel:  Recent Labs Lab 12/28/13 0130 12/28/13 0133 12/28/13 1550 12/29/13 0600  NA 139  --   --  140  K 3.0*  --  3.5* 3.8  CL 98  --   --  105  CO2 23  --   --  23  GLUCOSE 161*  --   --  249*  BUN 7  --   --  8  CREATININE 0.92  --   --  0.70  CALCIUM 8.7  --   --  9.0  MG  --  1.4*  --   --    CBC:  Recent Labs Lab 12/28/13 0130 12/29/13 0600  WBC 12.0* 13.5*  NEUTROABS 6.0  --   HGB 8.1* 7.1*  HCT 28.7* 25.8*  MCV 64.5* 65.0*  PLT 434* 331   BNP (last 3 results)  Recent Labs  05/28/13 1821  PROBNP 56.7   CBG:  Recent Labs Lab 12/28/13 1710 12/28/13 2114 12/29/13 0806 12/29/13 1005 12/29/13 1230  GLUCAP 329* 343* 233* 259* 193*    Recent Results (from the past 240 hour(s))  MRSA PCR SCREENING     Status: Abnormal   Collection Time    12/28/13 11:32 AM  Result Value Range Status   MRSA by PCR POSITIVE (*) NEGATIVE Final   Comment:            The GeneXpert MRSA Assay (FDA     approved for NASAL specimens     only), is one component of a     comprehensive MRSA colonization     surveillance program. It is not     intended to diagnose MRSA     infection nor to guide or     monitor treatment for     MRSA infections.     RESULT CALLED TO, READ BACK BY AND VERIFIED WITH:     LEWIS,S AT 1345 ON [id number] BY HOOKER,B     Studies: Dg Chest 2 View  12/28/2013   CLINICAL DATA:  Asthma, shortness of breath, fever.  EXAM: CHEST  2 VIEW  COMPARISON:  10/17/2013, 12/20/2012, 02/08/2013  FINDINGS: Cardiomediastinal contours are within normal range. Mild central peribronchial thickening. No confluent airspace opacity, pleural effusion, or pneumothorax. Normal expansion. No acute osseous finding.  IMPRESSION: Mild central peribronchial thickening is similar to priors; favor chronic bronchitic change  secondary to reactive airway disease given the stated history. No focal consolidation.   Electronically Signed   By: Carlos Levering M.D.   On: 12/28/2013 02:43    Scheduled Meds: . amLODipine  5 mg Oral Daily  . budesonide (PULMICORT) nebulizer solution  0.25 mg Nebulization BID  . enoxaparin (LOVENOX) injection  70 mg Subcutaneous Q24H  . ferrous gluconate  324 mg Oral TID WC  . fluticasone  2 spray Each Nare Daily  . folic acid  676 mcg Oral q morning - 10a  . furosemide  20 mg Intravenous Once  . insulin aspart  0-15 Units Subcutaneous TID WC  . insulin aspart  0-5 Units Subcutaneous QHS  . insulin aspart  3 Units Subcutaneous TID WC  . insulin glargine  5 Units Subcutaneous Daily  . ipratropium  0.5 mg Nebulization Q6H  . irbesartan  300 mg Oral Daily  . levalbuterol  0.63 mg Nebulization Q6H  . [START ON 12/30/2013] levofloxacin  750 mg Oral Daily  . pantoprazole  40 mg Oral Daily  . potassium gluconate  595 mg Oral Daily  . predniSONE  20 mg Oral BID WC  . sodium chloride  3 mL Intravenous Q12H  . sodium chloride  3 mL Intravenous Q12H   Continuous Infusions:   Principal Problem:   Acute asthma exacerbation Active Problems:   HTN (hypertension), malignant   Hyperglycemia   Hypokalemia   Asthma exacerbation   Diabetes mellitus  Time spent: [place], MD [facility] Pager [phone]. If 7 PM - 7 AM, please contact night-coverage at [email or web address], password [facility] 12/29/2013, 12:58 PM  LOS: 1 day

## 2013-12-30 LAB — CBC
HCT: 28.6 % — ABNORMAL LOW (ref 36.0–46.0)
HEMOGLOBIN: 8.5 g/dL — AB (ref 12.0–15.0)
MCH: 19.9 pg — ABNORMAL LOW (ref 26.0–34.0)
MCHC: 29.7 g/dL — AB (ref 30.0–36.0)
MCV: 66.8 fL — ABNORMAL LOW (ref 78.0–100.0)
Platelets: 335 10*3/uL (ref 150–400)
RBC: 4.28 MIL/uL (ref 3.87–5.11)
RDW: 20.4 % — AB (ref 11.5–15.5)
WBC: 12.3 10*3/uL — ABNORMAL HIGH (ref 4.0–10.5)

## 2013-12-30 LAB — BASIC METABOLIC PANEL
BUN: 12 mg/dL (ref 6–23)
CALCIUM: 8.6 mg/dL (ref 8.4–10.5)
CO2: 24 mEq/L (ref 19–32)
Chloride: 105 mEq/L (ref 96–112)
Creatinine, Ser: 0.72 mg/dL (ref 0.50–1.10)
GLUCOSE: 209 mg/dL — AB (ref 70–99)
POTASSIUM: 3.5 meq/L — AB (ref 3.7–5.3)
Sodium: 140 mEq/L (ref 137–147)

## 2013-12-30 MED ORDER — LEVALBUTEROL HCL 0.63 MG/3ML IN NEBU
0.6300 mg | INHALATION_SOLUTION | RESPIRATORY_TRACT | Status: DC
Start: 2013-12-30 — End: 2013-12-30
  Administered 2013-12-30 (×2): 0.63 mg via RESPIRATORY_TRACT
  Filled 2013-12-30 (×8): qty 3

## 2013-12-30 MED ORDER — METFORMIN HCL 500 MG PO TABS: 1000.0000 mg | ORAL_TABLET | Freq: Two times a day (BID) | ORAL | Status: AC

## 2013-12-30 MED ORDER — IRBESARTAN-HYDROCHLOROTHIAZIDE 300-12.5 MG PO TABS: 1.0000 | ORAL_TABLET | Freq: Every day | ORAL | Status: AC

## 2013-12-30 MED ORDER — FERROUS GLUCONATE 324 (38 FE) MG PO TABS: 324.0000 mg | ORAL_TABLET | Freq: Three times a day (TID) | ORAL | Status: AC

## 2013-12-30 MED ORDER — IPRATROPIUM BROMIDE 0.02 % IN SOLN
0.5000 mg | Freq: Four times a day (QID) | RESPIRATORY_TRACT | Status: DC
  Administered 2013-12-30: 0.5 mg via RESPIRATORY_TRACT
  Filled 2013-12-30 (×2): qty 2.5

## 2013-12-30 MED ORDER — LEVOFLOXACIN 750 MG PO TABS: 750.0000 mg | ORAL_TABLET | Freq: Every day | ORAL | Status: DC

## 2013-12-30 MED ORDER — PREDNISONE 20 MG PO TABS: 40.0000 mg | ORAL_TABLET | Freq: Every day | ORAL | Status: DC

## 2013-12-30 MED ORDER — BUDESONIDE-FORMOTEROL FUMARATE 160-4.5 MCG/ACT IN AERO: 2.0000 | INHALATION_SPRAY | Freq: Two times a day (BID) | RESPIRATORY_TRACT | Status: AC

## 2013-12-30 MED ORDER — ALBUTEROL SULFATE HFA 108 (90 BASE) MCG/ACT IN AERS: 2.0000 | INHALATION_SPRAY | RESPIRATORY_TRACT | Status: AC | PRN

## 2013-12-30 NOTE — Progress Notes (Signed)
Patient/family received discharge instructions, follow-up appts, medication lists, and when to call the doctor. All questions answered and pt verbalizes understanding.  Blanchard Kelch, RN

## 2013-12-30 NOTE — Discharge Summary (Signed)
Physician Discharge Summary  [name] WNI:[id number] DOB: 09/14/1976 DOA: 12/28/2013  PCP: No PCP Per Patient  Admit date: 12/28/2013 Discharge date: 12/30/2013  Time spent: 35 minutes  Recommendations for Outpatient Follow-up:  1. Follow up with Dr. Melvyn Novas in  3 days as previously scheduled  Discharge Diagnoses:  Principal Problem:   Acute asthma exacerbation Active Problems:   HTN (hypertension), malignant   Hyperglycemia   Hypokalemia   Asthma exacerbation   Diabetes mellitus  Discharge Condition: stable  Diet recommendation: diabetic  Filed Weights   12/28/13 0635  Weight: 139.2 kg (306 lb 14.1 oz)   History of present illness:  [name] [name] is a 38 y.o. female with history of gout last one hypertension, presents to the ER because of worsening shortness of breath. Patient has been having shortness of breath or productive cough for last 4 days. Denies any definite fever but had subjective feeling of chills. Denies chest pain. Patient in the ER was found to be wheezing and was given hour-long nebulizer despite which patient is still feeling tight to breathe. Patient has been admitted for asthma exacerbation. Patient otherwise denies any nausea vomiting abdominal pain diarrhea.   Hospital Course:  Acute asthma exacerbation - patient was admitted with asthma exacerbation. She was started on Duonebs, steroids and levofloxacin with significant improvement in her symptoms and breathing status. She was transitioned to oral antibiotics and prednisone prior to discharge. She ran out of her Symbicort few days prior.  Hypertension uncontrolled - patient states she was not taking Avalide for some time as she ran out of the prescription. I have added Irbesartan 300 mg which he takes at home with good control, with good control of her blood pressure, and a prescription has been provided to her on discharge.  Hyperglycemia due to newly diagnosed DM - patient states she gets  hyperglycemia only when she uses steroids otherwise she is not diabetic. A1C 7.8 however which is placing her into DM range. I have started Metformin 500 BID for a week then to increase to 1000 BID. She used to be on Metformin in the past.  Chronic anemia, iron deficiency - patient is not taking iron supplements at home. She only takes folic acid. She has chronic anemia and she received 1 transfusion in the hospital with good improvement in her Hb. No bleeding events. Iron studies confirm iron deficiency. Her anemia is likely due to heavy menses and history of fibroids. At times her bleeding is so severe that she cannot leave her home for 1-2 days. Advised for Gyn outpatient follow up. Iron prescription given to patient.   Procedures:  none   Consultations:  none  Discharge Exam: Filed Vitals:   12/29/13 2049 12/30/13 0137 12/30/13 0445 12/30/13 0849  BP: 130/82  137/85   Pulse: 104  102   Temp: 98.5 F (36.9 C)  98 F (36.7 C)   TempSrc: Oral  Oral   Resp: 20  18   Height:      Weight:      SpO2: 99% 97% 98% 100%   General: NAD Cardiovascular: RRR Respiratory: CTA biL  Discharge Instructions   Future Appointments Provider Department Dept Phone   01/02/2014 2:30 PM Tanda Rockers, MD [facility] [phone]       Medication List         albuterol 108 (90 BASE) MCG/ACT inhaler  Commonly known as:  PROVENTIL HFA;VENTOLIN HFA  Inhale 2 puffs into the lungs every 4 (four) hours  as needed for wheezing or shortness of breath.     budesonide-formoterol 160-4.5 MCG/ACT inhaler  Commonly known as:  SYMBICORT  Inhale 2 puffs into the lungs 2 (two) times daily.     diphenhydrAMINE 25 mg capsule  Commonly known as:  BENADRYL  Take 25 mg by mouth daily as needed for allergies.     ferrous gluconate 324 MG tablet  Commonly known as:  FERGON  Take 1 tablet (324 mg total) by mouth 3 (three) times daily with meals.     fluticasone 50 MCG/ACT nasal spray  Commonly  known as:  FLONASE  Place 2 sprays into the nose daily.     folic acid A999333 MCG tablet  Commonly known as:  FOLVITE  Take 400 mcg by mouth every morning.     ipratropium-albuterol 0.5-2.5 (3) MG/3ML Soln  Commonly known as:  DUONEB  Take 3 mLs by nebulization every 4 (four) hours as needed (for wheezing or shortness of breath).     irbesartan-hydrochlorothiazide 300-12.5 MG per tablet  Commonly known as:  AVALIDE  Take 1 tablet by mouth daily.     levofloxacin 750 MG tablet  Commonly known as:  LEVAQUIN  Take 1 tablet (750 mg total) by mouth daily.     metFORMIN 500 MG tablet  Commonly known as:  GLUCOPHAGE  Take 2 tablets (1,000 mg total) by mouth 2 (two) times daily with a meal. Take 1 tablet twice daily for 7 days then increase to 2 tablet twice daily.     pantoprazole 40 MG tablet  Commonly known as:  PROTONIX  Take 1 tablet (40 mg total) by mouth daily. Take 30-60 min before first meal of the day     Potassium 95 MG Tabs  Take 1 tablet by mouth daily.     predniSONE 20 MG tablet  Commonly known as:  DELTASONE  Take 2 tablets (40 mg total) by mouth daily with breakfast. 40 mg daily for 3 days then 20 mg daily for 3 days then 10 mg daily for 4 days then stop.       Follow-up Information   Follow up with Christinia Gully, MD. Schedule an appointment as soon as possible for a visit in 1 week.   Specialty:  Pulmonary Disease   Contact information:   [address] [place] [address] [phone]      The results of significant diagnostics from this hospitalization (including imaging, microbiology, ancillary and laboratory) are listed below for reference.    Significant Diagnostic Studies: Dg Chest 2 View  12/28/2013   CLINICAL DATA:  Asthma, shortness of breath, fever.  EXAM: CHEST  2 VIEW  COMPARISON:  10/17/2013, 12/20/2012, 02/08/2013  FINDINGS: Cardiomediastinal contours are within normal range. Mild central peribronchial thickening. No confluent airspace  opacity, pleural effusion, or pneumothorax. Normal expansion. No acute osseous finding.  IMPRESSION: Mild central peribronchial thickening is similar to priors; favor chronic bronchitic change secondary to reactive airway disease given the stated history. No focal consolidation.   Electronically Signed   By: Carlos Levering M.D.   On: 12/28/2013 02:43   Microbiology: Recent Results (from the past 240 hour(s))  MRSA PCR SCREENING     Status: Abnormal   Collection Time    12/28/13 11:32 AM      Result Value Range Status   MRSA by PCR POSITIVE (*) NEGATIVE Final   Comment:            The GeneXpert MRSA Assay (FDA  approved for NASAL specimens     only), is one component of a     comprehensive MRSA colonization     surveillance program. It is not     intended to diagnose MRSA     infection nor to guide or     monitor treatment for     MRSA infections.     RESULT CALLED TO, READ BACK BY AND VERIFIED WITH:     LEWIS,S AT 1345 ON [id number] BY HOOKER,B   Labs: Basic Metabolic Panel:  Recent Labs Lab 12/28/13 0130 12/28/13 0133 12/28/13 1550 12/29/13 0600 12/30/13 0558  NA 139  --   --  140 140  K 3.0*  --  3.5* 3.8 3.5*  CL 98  --   --  105 105  CO2 23  --   --  23 24  GLUCOSE 161*  --   --  249* 209*  BUN 7  --   --  8 12  CREATININE 0.92  --   --  0.70 0.72  CALCIUM 8.7  --   --  9.0 8.6  MG  --  1.4*  --   --   --    CBC:  Recent Labs Lab 12/28/13 0130 12/29/13 0600 12/30/13 0558  WBC 12.0* 13.5* 12.3*  NEUTROABS 6.0  --   --   HGB 8.1* 7.1* 8.5*  HCT 28.7* 25.8* 28.6*  MCV 64.5* 65.0* 66.8*  PLT 434* 331 335   BNP: BNP (last 3 results)  Recent Labs  05/28/13 1821  PROBNP 56.7   CBG:  Recent Labs Lab 12/28/13 1710 12/28/13 2114 12/29/13 0806 12/29/13 1005 12/29/13 1230  GLUCAP 329* 343* 233* 259* 193*   Signed: GHERGHE, COSTIN  [facility] 12/30/2013, 1:57 PM

## 2013-12-30 NOTE — Discharge Instructions (Signed)

## 2013-12-31 LAB — TYPE AND SCREEN
ABO/RH(D): O POS
ANTIBODY SCREEN: NEGATIVE
Unit division: 0

## 2014-01-01 ENCOUNTER — Inpatient Hospital Stay (HOSPITAL_COMMUNITY)
Admission: EM | Admit: 2014-01-01 | Discharge: 2014-01-06 | DRG: 208 | Disposition: A | Discharge status: Home / Self Care | Payer: Medicare Other | Attending: Pulmonary Disease | Admitting: Pulmonary Disease

## 2014-01-01 ENCOUNTER — Encounter (HOSPITAL_COMMUNITY): Payer: Self-pay | Admitting: Emergency Medicine

## 2014-01-01 ENCOUNTER — Emergency Department (HOSPITAL_COMMUNITY): Payer: Medicare Other

## 2014-01-01 ENCOUNTER — Inpatient Hospital Stay (HOSPITAL_COMMUNITY): Payer: Medicare Other

## 2014-01-01 DIAGNOSIS — I10 Essential (primary) hypertension: Secondary | ICD-10-CM | POA: Diagnosis present

## 2014-01-01 DIAGNOSIS — J9601 Acute respiratory failure with hypoxia: Secondary | ICD-10-CM

## 2014-01-01 DIAGNOSIS — F411 Generalized anxiety disorder: Secondary | ICD-10-CM | POA: Diagnosis present

## 2014-01-01 DIAGNOSIS — J42 Unspecified chronic bronchitis: Secondary | ICD-10-CM | POA: Diagnosis present

## 2014-01-01 DIAGNOSIS — J9600 Acute respiratory failure, unspecified whether with hypoxia or hypercapnia: Secondary | ICD-10-CM | POA: Diagnosis present

## 2014-01-01 DIAGNOSIS — E876 Hypokalemia: Secondary | ICD-10-CM | POA: Diagnosis present

## 2014-01-01 DIAGNOSIS — F3289 Other specified depressive episodes: Secondary | ICD-10-CM | POA: Diagnosis present

## 2014-01-01 DIAGNOSIS — J45902 Unspecified asthma with status asthmaticus: Principal | ICD-10-CM | POA: Diagnosis present

## 2014-01-01 DIAGNOSIS — K219 Gastro-esophageal reflux disease without esophagitis: Secondary | ICD-10-CM | POA: Diagnosis present

## 2014-01-01 DIAGNOSIS — Z91013 Allergy to seafood: Secondary | ICD-10-CM

## 2014-01-01 DIAGNOSIS — Z888 Allergy status to other drugs, medicaments and biological substances status: Secondary | ICD-10-CM

## 2014-01-01 DIAGNOSIS — G4733 Obstructive sleep apnea (adult) (pediatric): Secondary | ICD-10-CM

## 2014-01-01 DIAGNOSIS — D649 Anemia, unspecified: Secondary | ICD-10-CM | POA: Diagnosis present

## 2014-01-01 DIAGNOSIS — J45901 Unspecified asthma with (acute) exacerbation: Secondary | ICD-10-CM | POA: Insufficient documentation

## 2014-01-01 DIAGNOSIS — R000 Tachycardia, unspecified: Secondary | ICD-10-CM | POA: Diagnosis present

## 2014-01-01 DIAGNOSIS — F329 Major depressive disorder, single episode, unspecified: Secondary | ICD-10-CM | POA: Diagnosis present

## 2014-01-01 DIAGNOSIS — Z6841 Body Mass Index (BMI) 40.0 and over, adult: Secondary | ICD-10-CM

## 2014-01-01 DIAGNOSIS — G4730 Sleep apnea, unspecified: Secondary | ICD-10-CM | POA: Diagnosis present

## 2014-01-01 DIAGNOSIS — R490 Dysphonia: Secondary | ICD-10-CM | POA: Diagnosis not present

## 2014-01-01 DIAGNOSIS — Z79899 Other long term (current) drug therapy: Secondary | ICD-10-CM

## 2014-01-01 DIAGNOSIS — Z833 Family history of diabetes mellitus: Secondary | ICD-10-CM

## 2014-01-01 DIAGNOSIS — E119 Type 2 diabetes mellitus without complications: Secondary | ICD-10-CM | POA: Diagnosis present

## 2014-01-01 DIAGNOSIS — F4310 Post-traumatic stress disorder, unspecified: Secondary | ICD-10-CM | POA: Diagnosis present

## 2014-01-01 DIAGNOSIS — Z886 Allergy status to analgesic agent status: Secondary | ICD-10-CM

## 2014-01-01 LAB — BLOOD GAS, ARTERIAL
Acid-base deficit: 1.6 mmol/L (ref 0.0–2.0)
Acid-base deficit: 1.9 mmol/L (ref 0.0–2.0)
BICARBONATE: 26.4 meq/L — AB (ref 20.0–24.0)
Bicarbonate: 25.6 mEq/L — ABNORMAL HIGH (ref 20.0–24.0)
DRAWN BY: 295031
Drawn by: 295031
FIO2: 1 %
FIO2: 0.4 %
MECHVT: 550 mL
O2 SAT: 99.3 %
O2 Saturation: 99.6 %
PATIENT TEMPERATURE: 37
PEEP: 5 cmH2O
Patient temperature: 98.6
RATE: 12 resp/min
TCO2: 24.4 mmol/L (ref 0–100)
TCO2: 25.7 mmol/L (ref 0–100)
pCO2 arterial: 59 mmHg (ref 35.0–45.0)
pCO2 arterial: 69 mmHg (ref 35.0–45.0)
pH, Arterial: 7.26 — ABNORMAL LOW (ref 7.350–7.450)
pH, Arterial: 7.208 — ABNORMAL LOW (ref 7.350–7.450)
pO2, Arterial: 339 mmHg — ABNORMAL HIGH (ref 80.0–100.0)
pO2, Arterial: 232 mmHg — ABNORMAL HIGH (ref 80.0–100.0)

## 2014-01-01 LAB — CBC WITH DIFFERENTIAL/PLATELET
BASOS ABS: 0 10*3/uL (ref 0.0–0.1)
Basophils Relative: 0 % (ref 0–1)
EOS ABS: 1.6 10*3/uL — AB (ref 0.0–0.7)
Eosinophils Relative: 7 % — ABNORMAL HIGH (ref 0–5)
HEMATOCRIT: 35.1 % — AB (ref 36.0–46.0)
Hemoglobin: 10.4 g/dL — ABNORMAL LOW (ref 12.0–15.0)
LYMPHS PCT: 9 % — AB (ref 12–46)
Lymphs Abs: 2.1 10*3/uL (ref 0.7–4.0)
MCH: 19.7 pg — ABNORMAL LOW (ref 26.0–34.0)
MCHC: 29.6 g/dL — AB (ref 30.0–36.0)
MCV: 66.4 fL — AB (ref 78.0–100.0)
MONOS PCT: 8 % (ref 3–12)
Monocytes Absolute: 1.9 10*3/uL — ABNORMAL HIGH (ref 0.1–1.0)
Neutro Abs: 17.9 10*3/uL — ABNORMAL HIGH (ref 1.7–7.7)
Neutrophils Relative %: 76 % (ref 43–77)
PLATELETS: 501 10*3/uL — AB (ref 150–400)
RBC: 5.29 MIL/uL — ABNORMAL HIGH (ref 3.87–5.11)
RDW: 20.7 % — ABNORMAL HIGH (ref 11.5–15.5)
WBC: 23.5 10*3/uL — ABNORMAL HIGH (ref 4.0–10.5)

## 2014-01-01 LAB — COMPREHENSIVE METABOLIC PANEL
ALBUMIN: 3.9 g/dL (ref 3.5–5.2)
ALT: 20 U/L (ref 0–35)
AST: 22 U/L (ref 0–37)
Alkaline Phosphatase: 88 U/L (ref 39–117)
BUN: 9 mg/dL (ref 6–23)
CO2: 24 mEq/L (ref 19–32)
CREATININE: 0.86 mg/dL (ref 0.50–1.10)
Calcium: 8.3 mg/dL — ABNORMAL LOW (ref 8.4–10.5)
Chloride: 97 mEq/L (ref 96–112)
GFR calc Af Amer: >90 mL/min (ref >90)
GFR calc non Af Amer: 85 mL/min — ABNORMAL LOW (ref >90)
Glucose, Bld: 228 mg/dL — ABNORMAL HIGH (ref 70–99)
Potassium: 3.3 mEq/L — ABNORMAL LOW (ref 3.7–5.3)
Sodium: 138 mEq/L (ref 137–147)
TOTAL PROTEIN: 7.3 g/dL (ref 6.0–8.3)
Total Bilirubin: <0.2 mg/dL — ABNORMAL LOW (ref 0.3–1.2)

## 2014-01-01 LAB — GLUCOSE, CAPILLARY
GLUCOSE-CAPILLARY: 254 mg/dL — AB (ref 70–99)
GLUCOSE-CAPILLARY: 310 mg/dL — AB (ref 70–99)
Glucose-Capillary: 208 mg/dL — ABNORMAL HIGH (ref 70–99)
Glucose-Capillary: 188 mg/dL — ABNORMAL HIGH (ref 70–99)

## 2014-01-01 LAB — URINALYSIS, ROUTINE W REFLEX MICROSCOPIC
BILIRUBIN URINE: NEGATIVE
KETONES UR: NEGATIVE mg/dL
Leukocytes, UA: NEGATIVE
Nitrite: NEGATIVE
Specific Gravity, Urine: 1.027 (ref 1.005–1.030)
Urobilinogen, UA: 0.2 mg/dL (ref 0.0–1.0)
pH: 5.5 (ref 5.0–8.0)

## 2014-01-01 LAB — URINE MICROSCOPIC-ADD ON

## 2014-01-01 LAB — PROCALCITONIN

## 2014-01-01 LAB — POCT I-STAT TROPONIN I: Troponin i, poc: 0.01 ng/mL (ref 0.00–0.08)

## 2014-01-01 LAB — CG4 I-STAT (LACTIC ACID): Lactic Acid, Venous: 2.59 mmol/L — ABNORMAL HIGH (ref 0.5–2.2)

## 2014-01-01 LAB — D-DIMER, QUANTITATIVE: D-Dimer, Quant: 0.52 ug/mL-FEU — ABNORMAL HIGH (ref 0.00–0.48)

## 2014-01-01 MED ORDER — LIDOCAINE HCL (CARDIAC) 20 MG/ML IV SOLN
INTRAVENOUS | Status: AC
  Filled 2014-01-01: qty 5

## 2014-01-01 MED ORDER — INSULIN ASPART 100 UNIT/ML ~~LOC~~ SOLN: 2.0000 [IU] | SUBCUTANEOUS | Status: DC

## 2014-01-01 MED ORDER — MONTELUKAST SODIUM 5 MG PO CHEW
10.0000 mg | CHEWABLE_TABLET | Freq: Every day | ORAL | Status: DC
  Administered 2014-01-01 – 2014-01-05 (×5): 10 mg
  Filled 2014-01-01 (×8): qty 2

## 2014-01-01 MED ORDER — FUROSEMIDE 10 MG/ML IJ SOLN
100.0000 mg | Freq: Once | INTRAVENOUS | Status: DC
  Filled 2014-01-01 (×2): qty 10

## 2014-01-01 MED ORDER — ETOMIDATE 2 MG/ML IV SOLN
INTRAVENOUS | Status: AC
  Filled 2014-01-01: qty 20

## 2014-01-01 MED ORDER — MAGNESIUM SULFATE 50 % IJ SOLN: 1.0000 g | Freq: Once | INTRAMUSCULAR | Status: DC

## 2014-01-01 MED ORDER — ALBUTEROL (5 MG/ML) CONTINUOUS INHALATION SOLN
10.0000 mg/h | INHALATION_SOLUTION | RESPIRATORY_TRACT | Status: DC
  Administered 2014-01-01: 10 mg/h via RESPIRATORY_TRACT
  Filled 2014-01-01: qty 20

## 2014-01-01 MED ORDER — FLUTICASONE PROPIONATE 50 MCG/ACT NA SUSP
2.0000 | Freq: Every day | NASAL | Status: DC
Start: 2014-01-02 — End: 2014-01-06
  Administered 2014-01-03 – 2014-01-04 (×2): 2 via NASAL
  Filled 2014-01-01: qty 16

## 2014-01-01 MED ORDER — ALBUTEROL SULFATE (2.5 MG/3ML) 0.083% IN NEBU
5.0000 mg | INHALATION_SOLUTION | Freq: Once | RESPIRATORY_TRACT | Status: AC
  Administered 2014-01-01: 5 mg via RESPIRATORY_TRACT
  Filled 2014-01-01: qty 6

## 2014-01-01 MED ORDER — ALBUTEROL SULFATE (2.5 MG/3ML) 0.083% IN NEBU
2.5000 mg | INHALATION_SOLUTION | Freq: Four times a day (QID) | RESPIRATORY_TRACT | Status: DC
  Administered 2014-01-01 – 2014-01-02 (×2): 2.5 mg via RESPIRATORY_TRACT
  Filled 2014-01-01 (×2): qty 3

## 2014-01-01 MED ORDER — FENTANYL CITRATE 0.05 MG/ML IJ SOLN: 100.0000 ug | INTRAMUSCULAR | Status: DC | PRN

## 2014-01-01 MED ORDER — INSULIN GLARGINE 100 UNIT/ML ~~LOC~~ SOLN
20.0000 [IU] | Freq: Every day | SUBCUTANEOUS | Status: DC
  Administered 2014-01-01: 20 [IU] via SUBCUTANEOUS
  Filled 2014-01-01 (×2): qty 0.2

## 2014-01-01 MED ORDER — METHYLPREDNISOLONE SODIUM SUCC 125 MG IJ SOLR
60.0000 mg | Freq: Four times a day (QID) | INTRAMUSCULAR | Status: DC
  Administered 2014-01-01 – 2014-01-03 (×6): 60 mg via INTRAVENOUS
  Filled 2014-01-01 (×2): qty 0.96
  Filled 2014-01-01: qty 2
  Filled 2014-01-01 (×3): qty 0.96
  Filled 2014-01-01: qty 2
  Filled 2014-01-01 (×5): qty 0.96

## 2014-01-01 MED ORDER — BUDESONIDE 0.25 MG/2ML IN SUSP
0.2500 mg | Freq: Four times a day (QID) | RESPIRATORY_TRACT | Status: DC
  Administered 2014-01-01 – 2014-01-02 (×2): 0.25 mg via RESPIRATORY_TRACT
  Filled 2014-01-01 (×3): qty 2

## 2014-01-01 MED ORDER — ARTIFICIAL TEARS OP OINT: 1.0000 "application " | TOPICAL_OINTMENT | Freq: Three times a day (TID) | OPHTHALMIC | Status: DC

## 2014-01-01 MED ORDER — SODIUM CHLORIDE 0.9 % IV SOLN
3.0000 ug/kg/min | INTRAVENOUS | Status: DC
  Administered 2014-01-01 – 2014-01-02 (×2): 3 ug/kg/min via INTRAVENOUS
  Filled 2014-01-01 (×3): qty 20

## 2014-01-01 MED ORDER — DEXMEDETOMIDINE HCL IN NACL 200 MCG/50ML IV SOLN
0.4000 ug/kg/h | INTRAVENOUS | Status: DC
  Filled 2014-01-01: qty 50

## 2014-01-01 MED ORDER — PANTOPRAZOLE SODIUM 40 MG IV SOLR
40.0000 mg | Freq: Two times a day (BID) | INTRAVENOUS | Status: DC
  Administered 2014-01-01 – 2014-01-02 (×3): 40 mg via INTRAVENOUS
  Filled 2014-01-01 (×5): qty 40

## 2014-01-01 MED ORDER — FENTANYL CITRATE 0.05 MG/ML IJ SOLN: 50.0000 ug | INTRAMUSCULAR | Status: DC | PRN

## 2014-01-01 MED ORDER — IPRATROPIUM BROMIDE 0.02 % IN SOLN
0.5000 mg | Freq: Four times a day (QID) | RESPIRATORY_TRACT | Status: DC
  Administered 2014-01-01 – 2014-01-02 (×5): 0.5 mg via RESPIRATORY_TRACT
  Filled 2014-01-01 (×5): qty 2.5

## 2014-01-01 MED ORDER — SODIUM CHLORIDE 0.9 % IV SOLN
INTRAVENOUS | Status: DC
  Administered 2014-01-01 – 2014-01-02 (×2): via INTRAVENOUS

## 2014-01-01 MED ORDER — SUCCINYLCHOLINE CHLORIDE 20 MG/ML IJ SOLN
INTRAMUSCULAR | Status: AC
  Filled 2014-01-01: qty 1

## 2014-01-01 MED ORDER — LUBRIFRESH P.M. OP OINT
TOPICAL_OINTMENT | Freq: Three times a day (TID) | OPHTHALMIC | Status: DC
  Administered 2014-01-01 – 2014-01-02 (×3): via OPHTHALMIC
  Filled 2014-01-01: qty 3.5

## 2014-01-01 MED ORDER — LORAZEPAM 2 MG/ML IJ SOLN
INTRAMUSCULAR | Status: AC
  Administered 2014-01-01: 1 mg
  Filled 2014-01-01: qty 1

## 2014-01-01 MED ORDER — PROPOFOL 10 MG/ML IV EMUL
5.0000 ug/kg/min | INTRAVENOUS | Status: DC
  Administered 2014-01-01: 10 ug/kg/min via INTRAVENOUS
  Administered 2014-01-01: 70 ug/kg/min via INTRAVENOUS
  Administered 2014-01-02: 50 ug/kg/min via INTRAVENOUS
  Administered 2014-01-02: 55 ug/kg/min via INTRAVENOUS
  Administered 2014-01-02 (×2): 45 ug/kg/min via INTRAVENOUS
  Administered 2014-01-02: 55 ug/kg/min via INTRAVENOUS
  Administered 2014-01-02 (×4): 50 ug/kg/min via INTRAVENOUS
  Administered 2014-01-02: 35 ug/kg/min via INTRAVENOUS
  Administered 2014-01-03: 30 ug/kg/min via INTRAVENOUS
  Administered 2014-01-03 (×3): 45 ug/kg/min via INTRAVENOUS
  Filled 2014-01-01 (×2): qty 100
  Filled 2014-01-01: qty 200
  Filled 2014-01-01 (×15): qty 100

## 2014-01-01 MED ORDER — ALBUTEROL SULFATE (2.5 MG/3ML) 0.083% IN NEBU: 2.5000 mg | INHALATION_SOLUTION | RESPIRATORY_TRACT | Status: DC | PRN

## 2014-01-01 MED ORDER — SODIUM CHLORIDE 0.9 % IV SOLN: 250.0000 mL | INTRAVENOUS | Status: DC | PRN

## 2014-01-01 MED ORDER — MIDAZOLAM HCL 2 MG/2ML IJ SOLN
INTRAMUSCULAR | Status: AC
  Administered 2014-01-01: 4 mg
  Filled 2014-01-01: qty 4

## 2014-01-01 MED ORDER — MAGNESIUM SULFATE IN D5W 10-5 MG/ML-% IV SOLN
1.0000 g | Freq: Once | INTRAVENOUS | Status: AC
Start: 2014-01-01 — End: 2014-01-01
  Administered 2014-01-01: 1 g via INTRAVENOUS
  Filled 2014-01-01: qty 100

## 2014-01-01 MED ORDER — CISATRACURIUM BOLUS VIA INFUSION
0.0500 mg/kg | Freq: Once | INTRAVENOUS | Status: DC
  Filled 2014-01-01: qty 7

## 2014-01-01 MED ORDER — FENTANYL CITRATE 0.05 MG/ML IJ SOLN
INTRAMUSCULAR | Status: AC
  Administered 2014-01-01: 50 ug
  Filled 2014-01-01: qty 2

## 2014-01-01 MED ORDER — METHYLPREDNISOLONE SODIUM SUCC 40 MG IJ SOLR: 40.0000 mg | Freq: Two times a day (BID) | INTRAMUSCULAR | Status: DC

## 2014-01-01 MED ORDER — HEPARIN SODIUM (PORCINE) 5000 UNIT/ML IJ SOLN
5000.0000 [IU] | Freq: Three times a day (TID) | INTRAMUSCULAR | Status: DC
  Administered 2014-01-01 – 2014-01-06 (×14): 5000 [IU] via SUBCUTANEOUS
  Filled 2014-01-01 (×17): qty 1

## 2014-01-01 MED ORDER — SODIUM CHLORIDE 0.9 % IV SOLN
25.0000 ug/h | INTRAVENOUS | Status: DC
  Administered 2014-01-01: 100 ug/h via INTRAVENOUS
  Administered 2014-01-01: 50 ug/h via INTRAVENOUS
  Administered 2014-01-02: 200 ug/h via INTRAVENOUS
  Filled 2014-01-01 (×3): qty 50

## 2014-01-01 MED ORDER — MONTELUKAST SODIUM 5 MG PO CHEW
5.0000 mg | CHEWABLE_TABLET | Freq: Every day | ORAL | Status: DC
  Filled 2014-01-01: qty 1

## 2014-01-01 MED ORDER — HYDRALAZINE HCL 20 MG/ML IJ SOLN
10.0000 mg | INTRAMUSCULAR | Status: DC | PRN
  Administered 2014-01-03: 40 mg via INTRAVENOUS
  Administered 2014-01-03: 20 mg via INTRAVENOUS
  Filled 2014-01-01 (×2): qty 2

## 2014-01-01 MED ORDER — ROCURONIUM BROMIDE 50 MG/5ML IV SOLN
INTRAVENOUS | Status: AC
  Administered 2014-01-01: 50 mg
  Filled 2014-01-01: qty 2

## 2014-01-01 MED ORDER — ROCURONIUM BROMIDE 50 MG/5ML IV SOLN
INTRAVENOUS | Status: AC
  Filled 2014-01-01: qty 2

## 2014-01-01 MED ORDER — ETOMIDATE 2 MG/ML IV SOLN
INTRAVENOUS | Status: AC
  Administered 2014-01-01: 30 mg
  Filled 2014-01-01: qty 20

## 2014-01-01 MED ORDER — INSULIN ASPART 100 UNIT/ML ~~LOC~~ SOLN
0.0000 [IU] | SUBCUTANEOUS | Status: DC
  Administered 2014-01-01 – 2014-01-02 (×2): 15 [IU] via SUBCUTANEOUS
  Administered 2014-01-02: 11 [IU] via SUBCUTANEOUS
  Administered 2014-01-02 (×2): 7 [IU] via SUBCUTANEOUS
  Administered 2014-01-02: 11 [IU] via SUBCUTANEOUS
  Administered 2014-01-02 – 2014-01-03 (×5): 7 [IU] via SUBCUTANEOUS
  Administered 2014-01-03: 15 [IU] via SUBCUTANEOUS
  Administered 2014-01-03 (×2): 11 [IU] via SUBCUTANEOUS
  Administered 2014-01-04 (×3): 4 [IU] via SUBCUTANEOUS

## 2014-01-01 MED ORDER — METHYLPREDNISOLONE SODIUM SUCC 125 MG IJ SOLR
125.0000 mg | Freq: Once | INTRAMUSCULAR | Status: AC
  Administered 2014-01-01: 125 mg via INTRAVENOUS
  Filled 2014-01-01: qty 2

## 2014-01-01 NOTE — ED Notes (Signed)
Pt moving head, propofol rate increased.

## 2014-01-01 NOTE — ED Notes (Signed)
Critical care placing central line. Pt sedated, VS WNL.

## 2014-01-01 NOTE — ED Notes (Signed)
Pt's o2 sats dropped to 88%, critical care MD at bedside and informed. Propofol infusion increased to 20.

## 2014-01-01 NOTE — ED Notes (Signed)
Pt unresponsive. Intubation begun

## 2014-01-01 NOTE — ED Provider Notes (Signed)
CSN: [id number]     Arrival date & time 01/01/14  1343 History   First MD Initiated Contact with Patient 01/01/14 1409    level 5 due to respiratory distress Chief Complaint  Patient presents with  . Shortness of Breath   (Consider location/radiation/quality/duration/timing/severity/associated sxs/prior Treatment) HPI 38 y.o. Female with history of asthma discharged 2 days ago after admission for asthma.  States doing better until 1 pm.  When she became acutely short of breath with worsening wheezing.  EMS transported patient here on nrb and she continues very short of breath and is able to speak in only 1-2 word phrases.  Past Medical History  Diagnosis Date  . Asthma   . Hypertension   . Chronic anemia   . Abnormal TSH   . Morbid obesity   . GERD (gastroesophageal reflux disease)   . Seasonal allergies   . Nasal polyps   . Diabetes mellitus     type 2  . Chronic bronchitis   . Sleep apnea   . Shortness of breath 12/14/11    "related to how bad my asthma is; sometimes @ rest, lying down, w/exertion"  . Pneumonia   . Blood transfusion   . Inguinal hernia unilateral, non-recurrent     right  . Kidney infection 2008  . Headache(784.0)   . Anxiety   . Depression   . PTSD (post-traumatic stress disorder)     "related to prior hospitalizations; things that happened when I was in hospital"  . Asthma    Past Surgical History  Procedure Laterality Date  . Cervical cerclage  1999; 2002  . Tubal ligation  2002  . Cervical cerclage    . Tubal ligation     Family History  Problem Relation Age of Onset  . Diabetes Mother   . Heart disease Father   . Heart disease Sister    History  Substance Use Topics  . Smoking status: Never Smoker   . Smokeless tobacco: Never Used  . Alcohol Use: No     Comment: 12/14/11 "only on birthdays; parties, and such"   OB History   Grav Para Term Preterm Abortions TAB SAB Ect Mult Living                 Review of Systems  Unable to perform  ROS   Allergies  Aspirin; Aspirin; Ibuprofen; Shellfish allergy; Menthol; and Other  Home Medications   Current Outpatient Rx  Name  Route  Sig  Dispense  Refill  . albuterol (PROVENTIL HFA;VENTOLIN HFA) 108 (90 BASE) MCG/ACT inhaler   Inhalation   Inhale 2 puffs into the lungs every 4 (four) hours as needed for wheezing or shortness of breath.   1 Inhaler   11   . budesonide-formoterol (SYMBICORT) 160-4.5 MCG/ACT inhaler   Inhalation   Inhale 2 puffs into the lungs 2 (two) times daily.   1 Inhaler   1   . diphenhydrAMINE (BENADRYL) 25 mg capsule   Oral   Take 25 mg by mouth daily as needed for allergies.          . ferrous gluconate (FERGON) 324 MG tablet   Oral   Take 1 tablet (324 mg total) by mouth 3 (three) times daily with meals.   90 tablet   3   . fluticasone (FLONASE) 50 MCG/ACT nasal spray   Nasal   Place 2 sprays into the nose daily.   16 g   2   . folic acid (FOLVITE) 696  MCG tablet   Oral   Take 400 mcg by mouth every morning.          Marland Kitchen ipratropium-albuterol (DUONEB) 0.5-2.5 (3) MG/3ML SOLN   Nebulization   Take 3 mLs by nebulization every 4 (four) hours as needed (for wheezing or shortness of breath).   360 mL   11   . irbesartan-hydrochlorothiazide (AVALIDE) 300-12.5 MG per tablet   Oral   Take 1 tablet by mouth daily.   30 tablet   1   . levofloxacin (LEVAQUIN) 750 MG tablet   Oral   Take 1 tablet (750 mg total) by mouth daily.   4 tablet   0   . metFORMIN (GLUCOPHAGE) 500 MG tablet   Oral   Take 2 tablets (1,000 mg total) by mouth 2 (two) times daily with a meal. Take 1 tablet twice daily for 7 days then increase to 2 tablet twice daily.   120 tablet   2   . pantoprazole (PROTONIX) 40 MG tablet   Oral   Take 1 tablet (40 mg total) by mouth daily. Take 30-60 min before first meal of the day   30 tablet   2   . Potassium 95 MG TABS   Oral   Take 1 tablet by mouth daily.         . predniSONE (DELTASONE) 20 MG tablet    Oral   Take 2 tablets (40 mg total) by mouth daily with breakfast. 40 mg daily for 3 days then 20 mg daily for 3 days then 10 mg daily for 4 days then stop.   11 tablet   0    BP 200/111  Pulse 147  Temp(Src) 97.9 F (36.6 C) (Oral)  SpO2 100%  LMP 12/06/2013 Physical Exam  Nursing note and vitals reviewed. Constitutional:  Morbidly obese  HENT:  Head: Normocephalic and atraumatic.  Right Ear: External ear normal.  Left Ear: External ear normal.  Nose: Nose normal.  Mouth/Throat: Oropharynx is clear and moist.  Eyes: Conjunctivae and EOM are normal. Pupils are equal, round, and reactive to light.  Neck: Normal range of motion. Neck supple.  Cardiovascular: Tachycardia present.   Pulmonary/Chest: She is in respiratory distress. She has wheezes. She has no rales. She exhibits no tenderness.  Stridor over anterior neck both inspiratory and expiratory  Abdominal: Soft. Bowel sounds are normal.  Musculoskeletal: Normal range of motion. She exhibits no edema and no tenderness.  Neurological: She is alert. She has normal reflexes. She displays normal reflexes. She exhibits normal muscle tone. Coordination normal.  Skin: No rash noted.  Diaphoretic    ED Course  Procedures (including critical care time) Labs Review Labs Reviewed  BLOOD GAS, ARTERIAL - Abnormal; Notable for the following:    pH, Arterial 7.260 (*)    pCO2 arterial 59.0 (*)    pO2, Arterial 339.0 (*)    Bicarbonate 25.6 (*)    All other components within normal limits  CBC WITH DIFFERENTIAL - Abnormal; Notable for the following:    WBC 23.5 (*)    RBC 5.29 (*)    Hemoglobin 10.4 (*)    HCT 35.1 (*)    MCV 66.4 (*)    MCH 19.7 (*)    MCHC 29.6 (*)    RDW 20.7 (*)    Platelets 501 (*)    All other components within normal limits  COMPREHENSIVE METABOLIC PANEL - Abnormal; Notable for the following:    Potassium 3.3 (*)    Glucose,  Bld 228 (*)    Calcium 8.3 (*)    Total Bilirubin <0.2 (*)    GFR calc  non Af Amer 85 (*)    All other components within normal limits  D-DIMER, QUANTITATIVE - Abnormal; Notable for the following:    D-Dimer, Quant 0.52 (*)    All other components within normal limits  URINALYSIS, ROUTINE W REFLEX MICROSCOPIC - Abnormal; Notable for the following:    APPearance CLOUDY (*)    Glucose, UA >1000 (*)    Hgb urine dipstick MODERATE (*)    Protein, ur >300 (*)    All other components within normal limits  URINE MICROSCOPIC-ADD ON - Abnormal; Notable for the following:    Squamous Epithelial / LPF FEW (*)    Bacteria, UA MANY (*)    All other components within normal limits  CG4 I-STAT (LACTIC ACID) - Abnormal; Notable for the following:    Lactic Acid, Venous 2.59 (*)    All other components within normal limits  CULTURE, BLOOD (ROUTINE X 2)  CULTURE, BLOOD (ROUTINE X 2)  CULTURE, RESPIRATORY (NON-EXPECTORATED)  RESPIRATORY VIRUS PANEL  COMPREHENSIVE METABOLIC PANEL  STREP PNEUMONIAE URINARY ANTIGEN  BLOOD GAS, ARTERIAL  PROCALCITONIN  POCT I-STAT TROPONIN I   Imaging Review Dg Chest Portable 1 View  01/01/2014   CLINICAL DATA:  Shortness of breath  EXAM: PORTABLE CHEST - 1 VIEW  COMPARISON:  12/28/2013  FINDINGS: The heart size and mediastinal contours are within normal limits. Both lungs are clear. The visualized skeletal structures are unremarkable.  IMPRESSION: No active disease.   Electronically Signed   By: Skipper Cliche M.D.   On: 01/01/2014 14:48    EKG Interpretation    Date/Time:  Tuesday January 01 2014 14:28:52 EST Ventricular Rate:  145 PR Interval:  125 QRS Duration: 72 QT Interval:  343 QTC Calculation: 533 R Axis:   70 Text Interpretation:  Sinus tachycardia Anteroseptal infarct, age indeterminate Prolonged QT interval Confirmed by Lizett Chowning MD, Kristle Wesch ([id number]) on 01/01/2014 4:12:34 PM            MDM  Patient on respiratory distress on my initial evaluation. She is very hypertensive and has her inspiratory stridor as well as  expiratory wheezing. IV placed x2 by RN. Solu-Medrol 250 mg given. Patient assessed with ABG and placed on BiPAP with continuous neb and 50 mg at hour. Chest x-Rubi Tooley does not show any evidence of acute infiltrates or congestive heart failure  1 g of magnesium infusing. Patient continuously assessed of her first hour and shows no improvement. I discussed patient's care with Dr. Alva Garnet and he is in attendance at bedside.  CRITICAL CARE Performed by: Shaune Pollack Total critical care time: 75 Critical care time was exclusive of separately billable procedures and treating other patients. Critical care was necessary to treat or prevent imminent or life-threatening deterioration. Critical care was time spent personally by me on the following activities: development of treatment plan with patient and/or surrogate as well as nursing, discussions with consultants, evaluation of patient's response to treatment, examination of patient, obtaining history from patient or surrogate, ordering and performing treatments and interventions, ordering and review of laboratory studies, ordering and review of radiographic studies, pulse oximetry and re-evaluation of patient's condition.  Respiratory failure with questionable etiology as patient appears on exam to have vocal cord dysfunction with stridor but has some component of asthma also. Her white blood cell count is elevated but no acute infiltrate is seen on chest x-Kimberlly Norgard. Critical care  was consulted early on her emergency department course and is at her bedside and has assumed care.  Shaune Pollack, MD 01/01/14 [phone]

## 2014-01-01 NOTE — ED Notes (Signed)
Wait on chest x ray until central line completed per critical care at bedside.

## 2014-01-01 NOTE — Procedures (Signed)
PROCEDURE NOTE: [place] CVL PLACEMENT  INDICATION:    Monitoring of central venous pressures and/or administration of medications optimally administered in central vein  CONSENT:   Performed as emergent necessity  PROCEDURE  Maximum sterile technique was used including antiseptics, cap, gloves, gown, hand hygiene, mask and sheet.  Skin prep: Chlorhexidine; local anesthetic administered  A antimicrobial bonded/coated triple lumen catheter was placed in the [place] vein using the Seldinger technique.    EVALUATION:  Blood flow good  Complications: No apparent complications  Patient tolerated the procedure well.  Chest X-ray reveals adequate position and no ptx   Merton Border, MD PCCM service Mobile [phone]

## 2014-01-01 NOTE — Sedation Documentation (Signed)
Medication dose calculated and verified for: [name] [name]

## 2014-01-01 NOTE — Progress Notes (Signed)
   CARE MANAGEMENT ED NOTE 01/01/2014  Patient:  [name] [name]   Account Number:  [id number]  Date Initiated:  01/01/2014  Documentation initiated by:  Jackelyn Poling  Subjective/Objective Assessment:   39 yr old female [employer] medicare complete pt with hx of asthma c/o sob after home treatments failed chest tightness HR 147-150 bp 100/111 wbc 23.5 hgb 10.4 lactic acid 2.59 ph 7.26 pCO2 59 pO2 339  Started on bipap then full vent support     Subjective/Objective Assessment Detail:   NO pcp listed Pt unable to provide at this time No family at bedside     Action/Plan:   Cm unable to speak with intubated pt UR completed    Action/Plan Detail:   Anticipated DC Date:  01/04/2014     Status Recommendation to Physician:   Result of Recommendation:    Other ED Services  Consult Working Jack - Pt will follow up  Other    Choice offered to / List presented to:            Status of service:  Completed, signed off  ED Comments:   ED Comments Detail:

## 2014-01-01 NOTE — ED Notes (Signed)
66mcg fentanyl, 4mg  versed given

## 2014-01-01 NOTE — ED Notes (Signed)
Bed: HU83 Expected date:  Expected time:  Means of arrival:  Comments: Asthma hr 150

## 2014-01-01 NOTE — ED Notes (Signed)
Pt put on vent, portable x ray arrived.

## 2014-01-01 NOTE — H&P (Signed)
Name: [name] [name] MRN: [id number] DOB: Sep 09, 1976    ADMISSION DATE:  01/01/2014 CONSULTATION DATE:  2/3 PRIMARY SERVICE: PCCM   CHIEF COMPLAINT:  Acute resp failure   BRIEF PATIENT DESCRIPTION:  17 F pt of MW with difficult to control asthma complicated by what we felt to be significant upper airway component. Was just hospitalized 1/30 - 2/1 for acute asthmatic exacerbation c/b poorly controlled HTN. Admitted via ER 2/3 w/ sudden onset of resp distress, hypercarbia, severe agitation, hypertension. Failed non-invasive ventilation and intubated by PCCM in ED  SIGNIFICANT EVENTS / STUDIES:  LE [place] 2/3 >>   LINES / TUBES: ETT 2/3 >>  Left CVL 2/3 >>  Radial A line (ordered) 2/03 >>   CULTURES: PCT 2/03: < 0.10 Urine strep Ag 2/03 >>  Resp 2/3 >> RVP 2/3 >>  ANTIBIOTICS:   HISTORY OF PRESENT ILLNESS:   This is a 38 year old female previously f/b Wert for difficult to control asthma complicated by what we felt to be significant upper airway component. Was just d/c on 2/1 after being discharged for acute asthmatic exacerbation c/b poorly controlled HTN. Went home w/ pred taper, abx, and typical resp regimen of LABA/ICS. Developed sudden onset of acute resp distress on 2/3. Presented to the ER. Had marked upper-airway wheeze on presentation. Failed non-invasive ventilation, cont-neb and anxiolytics. Hypoxic off BIPAP. Intubated. Remained significantly bronchospastic even after intubation. Was sedated and admitted to the intensive care.   PAST MEDICAL HISTORY :  Past Medical History  Diagnosis Date  . Asthma   . Hypertension   . Chronic anemia   . Abnormal TSH   . Morbid obesity   . GERD (gastroesophageal reflux disease)   . Seasonal allergies   . Nasal polyps   . Diabetes mellitus     type 2  . Chronic bronchitis   . Sleep apnea   . Shortness of breath 12/14/11    "related to how bad my asthma is; sometimes @ rest, lying down, w/exertion"  . Pneumonia   . Blood  transfusion   . Inguinal hernia unilateral, non-recurrent     right  . Kidney infection 2008  . Headache(784.0)   . Anxiety   . Depression   . PTSD (post-traumatic stress disorder)     "related to prior hospitalizations; things that happened when I was in hospital"  . Asthma    Past Surgical History  Procedure Laterality Date  . Cervical cerclage  1999; 2002  . Tubal ligation  2002  . Cervical cerclage    . Tubal ligation     Prior to Admission medications   Medication Sig Start Date End Date Taking? Authorizing Provider  albuterol (PROVENTIL HFA;VENTOLIN HFA) 108 (90 BASE) MCG/ACT inhaler Inhale 2 puffs into the lungs every 4 (four) hours as needed for wheezing or shortness of breath. 12/30/13   Costin Karlyne Greenspan, MD  budesonide-formoterol (SYMBICORT) 160-4.5 MCG/ACT inhaler Inhale 2 puffs into the lungs 2 (two) times daily. 12/30/13   Costin Karlyne Greenspan, MD  diphenhydrAMINE (BENADRYL) 25 mg capsule Take 25 mg by mouth daily as needed for allergies.     Historical Provider, MD  ferrous gluconate (FERGON) 324 MG tablet Take 1 tablet (324 mg total) by mouth 3 (three) times daily with meals. 12/30/13   Costin Karlyne Greenspan, MD  fluticasone (FLONASE) 50 MCG/ACT nasal spray Place 2 sprays into the nose daily. 05/30/13   Erline Hau, MD  folic acid (FOLVITE) 409 MCG tablet Take  400 mcg by mouth every morning.     Historical Provider, MD  ipratropium-albuterol (DUONEB) 0.5-2.5 (3) MG/3ML SOLN Take 3 mLs by nebulization every 4 (four) hours as needed (for wheezing or shortness of breath). 10/06/13   Theodis Blaze, MD  irbesartan-hydrochlorothiazide (AVALIDE) 300-12.5 MG per tablet Take 1 tablet by mouth daily. 12/30/13   Costin Karlyne Greenspan, MD  levofloxacin (LEVAQUIN) 750 MG tablet Take 1 tablet (750 mg total) by mouth daily. 12/30/13   Costin Karlyne Greenspan, MD  metFORMIN (GLUCOPHAGE) 500 MG tablet Take 2 tablets (1,000 mg total) by mouth 2 (two) times daily with a meal. Take 1 tablet twice daily for 7  days then increase to 2 tablet twice daily. 12/30/13   Costin Karlyne Greenspan, MD  pantoprazole (PROTONIX) 40 MG tablet Take 1 tablet (40 mg total) by mouth daily. Take 30-60 min before first meal of the day 11/01/13   Tanda Rockers, MD  Potassium 95 MG TABS Take 1 tablet by mouth daily.    Historical Provider, MD  predniSONE (DELTASONE) 20 MG tablet Take 2 tablets (40 mg total) by mouth daily with breakfast. 40 mg daily for 3 days then 20 mg daily for 3 days then 10 mg daily for 4 days then stop. 12/30/13   Costin Karlyne Greenspan, MD   Allergies  Allergen Reactions  . Aspirin Anaphylaxis  . Aspirin Anaphylaxis  . Ibuprofen Anaphylaxis  . Shellfish Allergy Hives and Swelling    Facial and oral swelling  . Menthol Swelling    Tongue swelling  . Other Other (See Comments)    Plastic thermometer covers cause sloughing off of skin on tongue and inside mouth    FAMILY HISTORY:  Family History  Problem Relation Age of Onset  . Diabetes Mother   . Heart disease Father   . Heart disease Sister    SOCIAL HISTORY:  reports that she has never smoked. She has never used smokeless tobacco. She reports that she does not drink alcohol or use illicit drugs.  REVIEW OF SYSTEMS:  Unable   SUBJECTIVE:   VITAL SIGNS: Temp:  [97.9 F (36.6 C)-99.6 F (37.6 C)] 99.6 F (37.6 C) (02/03 1447) Pulse Rate:  [139-147] 143 (02/03 1502) Resp:  [21] 21 (02/03 1446) BP: (198-200)/(111-118) 198/118 mmHg (02/03 1502) SpO2:  [95 %-100 %] 96 % (02/03 1510) FiO2 (%):  [40 %] 40 % (02/03 1510) HEMODYNAMICS:   VENTILATOR SETTINGS: Vent Mode:  [-] BIPAP FiO2 (%):  [40 %] 40 % Set Rate:  [14 bmp] 14 bmp PEEP:  [8 cmH20] 8 cmH20 INTAKE / OUTPUT: Intake/Output   None     PHYSICAL EXAMINATION: General:  In acute distress  Neuro:  Restless, intact but very anxious  HEENT:  Neck large, +++ pseudowheeze  Cardiovascular:  rrr Lungs:  Marked accessory muscle use, markedly diminished BS with scattered exp wheezes,  prominent pseudowheeze prior to intubation, markedly prolonged expiration after intubation Abdomen:  Obese  Ext: warm, no edema Skin:  No lesions  LABS:  CBC  Recent Labs Lab 12/29/13 0600 12/30/13 0558 01/01/14 1510  WBC 13.5* 12.3* 23.5*  HGB 7.1* 8.5* 10.4*  HCT 25.8* 28.6* 35.1*  PLT 331 335 501*   Coag's No results found for this basename: APTT, INR,  in the last 168 hours BMET  Recent Labs Lab 12/28/13 0130 12/28/13 1550 12/29/13 0600 12/30/13 0558  NA 139  --  140 140  K 3.0* 3.5* 3.8 3.5*  CL 98  --  105  105  CO2 23  --  23 24  BUN 7  --  8 12  CREATININE 0.92  --  0.70 0.72  GLUCOSE 161*  --  249* 209*   Electrolytes  Recent Labs Lab 12/28/13 0130 12/28/13 0133 12/29/13 0600 12/30/13 0558  CALCIUM 8.7  --  9.0 8.6  MG  --  1.4*  --   --    Sepsis Markers  Recent Labs Lab 01/01/14 1518  LATICACIDVEN 2.59*   ABG  Recent Labs Lab 01/01/14 1425  PHART 7.260*  PCO2ART 59.0*  PO2ART 339.0*   Liver Enzymes No results found for this basename: AST, ALT, ALKPHOS, BILITOT, ALBUMIN,  in the last 168 hours Cardiac Enzymes No results found for this basename: TROPONINI, PROBNP,  in the last 168 hours Glucose  Recent Labs Lab 12/29/13 0806 12/29/13 1005 12/29/13 1230 12/29/13 1718 12/29/13 2118 12/30/13 0813  GLUCAP 233* 259* 193* 254* 208* 188*     CXR: NACPD   ASSESSMENT / PLAN:  PULMONARY A: Acute respiratory failure Chronic poorly controlled asthma  Given hx of nasal polyps and ASA sensitivity, she is likely to benefit from leukotriene inhibitor Status asthmaticus Suspect component of VCD Note: prolonged exp phase after intubation P:   Full vent support Permissive hypercapnea Scheduled BDs Scheduled syst steroids Sedation protocol w/ prop/fent NMB at least through night tonight Add Montelukast    CARDIOVASCULAR A: HTN, reactive Tachycardia, reactive P:  PRN hydralazine to maintain SBP < 170 mmHg KVO IVF Get echo    RENAL A:  No acute  P:   Monitor BMET intermittently Monitor I/Os Correct electrolytes as indicated   GASTROINTESTINAL A:  Morbid obesity  GERD, chronic PPI use P:   SUP: IV PPI  OGT ordered Consider TFs 2/04   HEMATOLOGIC A:  Chronic anemia, h/o FESO4 def  No evidence of bleeding   P:  DVT px: [place] heparin  Trend CBC   INFECTIOUS A: No overt infection  P:   Ck PCT-->if + will add nosocomial coverage  resp viral panel and culture    ENDOCRINE A:  DM II Hyperglycemia  P:   Lantus and resistant SSI ordered  NEUROLOGIC A:  ICU associated agitation/discomfort  P:   Propfol/fentanyl  Cisatracurium until  BIS monitoring while on NMBs  TODAY'S SUMMARY:   I have personally obtained a history, examined the patient, evaluated laboratory and imaging results, formulated the assessment and plan and placed orders. CRITICAL CARE: The patient is critically ill with multiple organ systems failure and requires high complexity decision making for assessment and support, frequent evaluation and titration of therapies, application of advanced monitoring technologies and extensive interpretation of multiple databases. Critical Care Time devoted to patient care services described in this note is 45 minutes.   Merton Border, MD ; [facility] [phone].  After 5:30 PM or weekends, call [phone] Pulmonary and [place]  01/01/2014, 3:55 PM

## 2014-01-01 NOTE — ED Notes (Signed)
bipap started.

## 2014-01-01 NOTE — Procedures (Signed)
Arterial Catheter Insertion Procedure Note Thora Scherman [id number] 1976/04/12  Procedure: Insertion of Arterial Catheter  Indications: Blood pressure monitoring  Procedure Details Consent: Unable to obtain consent because of altered level of consciousness. Time Out: Verified patient identification, verified procedure, site/side was marked, verified correct patient position, special equipment/implants available, medications/allergies/relevent history reviewed, required imaging and test results available.  Performed  Maximum sterile technique was used including antiseptics, cap, gloves, gown, hand hygiene, mask and sheet. Skin prep: Chlorhexidine; local anesthetic administered 20 gauge catheter was inserted into right radial artery using the Seldinger technique.  Evaluation Blood flow good; BP tracing good. Complications: No apparent complications.   Dulcy Fanny 01/01/2014

## 2014-01-01 NOTE — ED Notes (Addendum)
Per EMS, pt has been having shortness of breath since 1 PM today. Pt has hx of asthma. Pt took 60 mg of prednisone at 1:15PM and did 3 5mg  albuterol treatments. EMS gave pt 10mg  albuterol/1 mg atrovent and 0.15mg  Epi IM, which pt states did not provide relief. Pt states she also has chest tightness.[name] [name]

## 2014-01-01 NOTE — Procedures (Signed)
Oral Intubation Procedure Note   Procedure: Intubation Indications: Respiratory insufficiency Consent: obtained verbally from patient Time Out: Verified patient identification, verified procedure, site/side was marked, verified correct patient position, special equipment/implants available, medications/allergies/relevent history reviewed, required imaging and test results available.   Pre-meds: Etomidate 20 mg IV  Neuromuscular blockade: Rocuronium 50 mg IV  Laryngoscope: #3 Glidescope  Visualization: cords fully visualized  ETT: 7.5 ETT passed on first attempt and secured @ 23 cm at upper incisors  Findings:    Evaluation:  CXR revealed proper position of ETT Pt tolerated procedure well without complications   Merton Border, MD ; [facility] service Mobile [phone].  After 5:30 PM or weekends, call [phone]

## 2014-01-01 NOTE — ED Notes (Signed)
Dr Jeanell Sparrow made aware of abnormal lactic. dt

## 2014-01-01 NOTE — ED Notes (Addendum)
Intubation complete by critical care. Rise and fall of chest. Color change on CO2 detector, bilateral lung sounds auscaltated. Pt on BVM.

## 2014-01-01 NOTE — ED Notes (Signed)
Tight lung sounds ascaltated by critical care.

## 2014-01-01 NOTE — ED Notes (Addendum)
Respiratory, critical care MD, EDP at bedside

## 2014-01-01 NOTE — ED Notes (Signed)
30 atimidate given by Ray MD. 74 roc given

## 2014-01-01 NOTE — ED Notes (Signed)
Critical care at bedside  

## 2014-01-01 NOTE — ED Notes (Signed)
Pt suctioned

## 2014-01-01 NOTE — ED Notes (Signed)
Critical care setting up for central line

## 2014-01-02 ENCOUNTER — Inpatient Hospital Stay (HOSPITAL_COMMUNITY): Payer: Medicare Other

## 2014-01-02 ENCOUNTER — Ambulatory Visit: Payer: Self-pay | Admitting: Internal Medicine

## 2014-01-02 DIAGNOSIS — I517 Cardiomegaly: Secondary | ICD-10-CM

## 2014-01-02 DIAGNOSIS — R069 Unspecified abnormalities of breathing: Secondary | ICD-10-CM

## 2014-01-02 DIAGNOSIS — J45901 Unspecified asthma with (acute) exacerbation: Secondary | ICD-10-CM

## 2014-01-02 DIAGNOSIS — J9600 Acute respiratory failure, unspecified whether with hypoxia or hypercapnia: Secondary | ICD-10-CM

## 2014-01-02 LAB — GLUCOSE, CAPILLARY
GLUCOSE-CAPILLARY: 270 mg/dL — AB (ref 70–99)
GLUCOSE-CAPILLARY: 229 mg/dL — AB (ref 70–99)
Glucose-Capillary: 220 mg/dL — ABNORMAL HIGH (ref 70–99)
Glucose-Capillary: 248 mg/dL — ABNORMAL HIGH (ref 70–99)
Glucose-Capillary: 296 mg/dL — ABNORMAL HIGH (ref 70–99)
Glucose-Capillary: 329 mg/dL — ABNORMAL HIGH (ref 70–99)

## 2014-01-02 LAB — BLOOD GAS, ARTERIAL
Acid-base deficit: 1.7 mmol/L (ref 0.0–2.0)
Bicarbonate: 25.3 mEq/L — ABNORMAL HIGH (ref 20.0–24.0)
DRAWN BY: 103701
FIO2: 0.4 %
LHR: 12 {breaths}/min
MECHVT: 550 mL
O2 SAT: 96.5 %
PATIENT TEMPERATURE: 98.6
PEEP/CPAP: 5 cmH2O
PH ART: 7.25 — AB (ref 7.350–7.450)
TCO2: 24.7 mmol/L (ref 0–100)
pCO2 arterial: 59.8 mmHg (ref 35.0–45.0)
pO2, Arterial: 92.8 mmHg (ref 80.0–100.0)

## 2014-01-02 LAB — BASIC METABOLIC PANEL
BUN: 16 mg/dL (ref 6–23)
CHLORIDE: 98 meq/L (ref 96–112)
CO2: 25 mEq/L (ref 19–32)
CREATININE: 0.81 mg/dL (ref 0.50–1.10)
Calcium: 7.6 mg/dL — ABNORMAL LOW (ref 8.4–10.5)
GFR calc non Af Amer: >90 mL/min (ref >90)
Glucose, Bld: 312 mg/dL — ABNORMAL HIGH (ref 70–99)
POTASSIUM: 4.8 meq/L (ref 3.7–5.3)
Sodium: 135 mEq/L — ABNORMAL LOW (ref 137–147)

## 2014-01-02 LAB — RAPID URINE DRUG SCREEN, HOSP PERFORMED
Amphetamines: NOT DETECTED
Barbiturates: NOT DETECTED
Benzodiazepines: POSITIVE — AB
Cocaine: NOT DETECTED
OPIATES: NOT DETECTED
Tetrahydrocannabinol: NOT DETECTED

## 2014-01-02 LAB — CBC
HCT: 32.3 % — ABNORMAL LOW (ref 36.0–46.0)
Hemoglobin: 8.9 g/dL — ABNORMAL LOW (ref 12.0–15.0)
MCH: 19 pg — ABNORMAL LOW (ref 26.0–34.0)
MCHC: 27.6 g/dL — ABNORMAL LOW (ref 30.0–36.0)
MCV: 68.9 fL — ABNORMAL LOW (ref 78.0–100.0)
PLATELETS: 359 10*3/uL (ref 150–400)
RBC: 4.69 MIL/uL (ref 3.87–5.11)
RDW: 20.7 % — AB (ref 11.5–15.5)
WBC: 18.5 10*3/uL — AB (ref 4.0–10.5)

## 2014-01-02 LAB — URINE CULTURE
CULTURE: NO GROWTH
Colony Count: NO GROWTH

## 2014-01-02 LAB — PROCALCITONIN: PROCALCITONIN: 0.35 ng/mL

## 2014-01-02 LAB — STREP PNEUMONIAE URINARY ANTIGEN: Strep Pneumo Urinary Antigen: NEGATIVE

## 2014-01-02 MED ORDER — VITAL HIGH PROTEIN PO LIQD
1000.0000 mL | ORAL | Status: DC
  Administered 2014-01-02: 1000 mL
  Filled 2014-01-02 (×3): qty 1000

## 2014-01-02 MED ORDER — ALBUTEROL SULFATE (2.5 MG/3ML) 0.083% IN NEBU
2.5000 mg | INHALATION_SOLUTION | Freq: Four times a day (QID) | RESPIRATORY_TRACT | Status: DC
Start: 2014-01-02 — End: 2014-01-02
  Administered 2014-01-02 (×2): 2.5 mg via RESPIRATORY_TRACT
  Filled 2014-01-02 (×2): qty 3

## 2014-01-02 MED ORDER — CHLORHEXIDINE GLUCONATE 0.12 % MT SOLN
15.0000 mL | Freq: Two times a day (BID) | OROMUCOSAL | Status: DC
  Administered 2014-01-02 – 2014-01-03 (×3): 15 mL via OROMUCOSAL
  Filled 2014-01-02 (×3): qty 15

## 2014-01-02 MED ORDER — BUDESONIDE 0.25 MG/2ML IN SUSP
0.2500 mg | Freq: Four times a day (QID) | RESPIRATORY_TRACT | Status: DC
Start: 2014-01-02 — End: 2014-01-03
  Administered 2014-01-02 – 2014-01-03 (×5): 0.25 mg via RESPIRATORY_TRACT
  Filled 2014-01-02 (×12): qty 2

## 2014-01-02 MED ORDER — BIOTENE DRY MOUTH MT LIQD
15.0000 mL | Freq: Four times a day (QID) | OROMUCOSAL | Status: DC
  Administered 2014-01-02 – 2014-01-03 (×4): 15 mL via OROMUCOSAL

## 2014-01-02 MED ORDER — INSULIN GLARGINE 100 UNIT/ML ~~LOC~~ SOLN
25.0000 [IU] | Freq: Every day | SUBCUTANEOUS | Status: DC
  Administered 2014-01-02 – 2014-01-05 (×4): 25 [IU] via SUBCUTANEOUS
  Filled 2014-01-02 (×6): qty 0.25

## 2014-01-02 MED ORDER — VITAL HIGH PROTEIN PO LIQD: 1000.0000 mL | ORAL | Status: DC

## 2014-01-02 MED ORDER — IPRATROPIUM-ALBUTEROL 0.5-2.5 (3) MG/3ML IN SOLN
3.0000 mL | Freq: Four times a day (QID) | RESPIRATORY_TRACT | Status: DC
  Administered 2014-01-02 – 2014-01-04 (×7): 3 mL via RESPIRATORY_TRACT
  Filled 2014-01-02 (×7): qty 3

## 2014-01-02 NOTE — Progress Notes (Signed)
*  PRELIMINARY RESULTS* Vascular Ultrasound Lower extremity venous duplex has been completed.  Preliminary findings: no evidence of DVT   Landry Mellow, RDMS, RVT  01/02/2014, 9:47 AM

## 2014-01-02 NOTE — Progress Notes (Signed)
Name: [name] [name] MRN: [id number] DOB: [birth year]    ADMISSION DATE:  01/01/2014 CONSULTATION DATE:  2/3 PRIMARY SERVICE: PCCM   CHIEF COMPLAINT:  Acute resp failure   BRIEF PATIENT DESCRIPTION:  50 F pt of MW with difficult to control asthma complicated by what we felt to be significant upper airway component. Was just hospitalized 1/30 - 2/1 for acute asthmatic exacerbation c/b poorly controlled HTN. Admitted via ER 2/3 w/ sudden onset of resp distress, hypercarbia, severe agitation, hypertension. Failed non-invasive ventilation and intubated by PCCM in ED  SIGNIFICANT EVENTS / STUDIES:  LE [place] 2/3 >>   LINES / TUBES: ETT 2/3 >>  Left CVL 2/3 >>  Radial A line rt radial 2/03 >>   CULTURES: PCT 2/03: < 0.10 Urine strep Ag 2/03 >>  Resp 2/3 >> RVP 2/3 >> mrsa + 2/3  ANTIBIOTICS: None   HISTORY OF PRESENT ILLNESS:   This is a 38 year old female previously f/b Wert for difficult to control asthma complicated by what we felt to be significant upper airway component. Was just d/c on 2/1 after being discharged for acute asthmatic exacerbation c/b poorly controlled HTN. Went home w/ pred taper, abx, and typical resp regimen of LABA/ICS. Developed sudden onset of acute resp distress on 2/3. Presented to the ER. Had marked upper-airway wheeze on presentation. Failed non-invasive ventilation, cont-neb and anxiolytics. Hypoxic off BIPAP. Intubated. Remained significantly bronchospastic even after intubation. Was sedated and admitted to the intensive care.   SUBJECTIVE:  Sedated on vent VITAL SIGNS: Temp:  [97.3 F (36.3 C)-99.7 F (37.6 C)] 97.5 F (36.4 C) (02/04 1000) Pulse Rate:  [104-147] 106 (02/04 1000) Resp:  [0-25] 12 (02/04 1000) BP: (88-219)/(37-187) 144/73 mmHg (02/04 1000) SpO2:  [85 %-100 %] 98 % (02/04 1000) Arterial Line BP: (71-158)/(48-149) 158/149 mmHg (02/04 0900) FiO2 (%):  [40 %] 40 % (02/04 1000) Weight:  [306 lb 14.1 oz (139.2 kg)] 306 lb 14.1 oz  (139.2 kg) (02/03 1556) HEMODYNAMICS:   VENTILATOR SETTINGS: Vent Mode:  [-] PRVC FiO2 (%):  [40 %] 40 % Set Rate:  [12 bmp-14 bmp] 12 bmp Vt Set:  [550 mL] 550 mL PEEP:  [5 cmH20-8 cmH20] 5 cmH20 Plateau Pressure:  [18 cmH20-30 cmH20] 20 cmH20 INTAKE / OUTPUT: Intake/Output     02/03 0701 - 02/04 0700 02/04 0701 - 02/05 0700   I.V. (mL/kg) 1267.9 (9.1) 86.9 (0.6)   Total Intake(mL/kg) 1267.9 (9.1) 86.9 (0.6)   Urine (mL/kg/hr) 1715 200 (0.5)   Total Output 1715 200   Net -447.1 -113.1          PHYSICAL EXAMINATION: General:Sedated on vent Neuro:  sedated HEENT:  Neck large,  Cardiovascular:  rrr Lungs:  Slight wheeze intubated Abdomen:  Obese  Ext: warm, no edema Skin:  No lesions  LABS:  CBC  Recent Labs Lab 12/30/13 0558 01/01/14 1510 01/02/14 0445  WBC 12.3* 23.5* 18.5*  HGB 8.5* 10.4* 8.9*  HCT 28.6* 35.1* 32.3*  PLT 335 501* 359   Coag's No results found for this basename: APTT, INR,  in the last 168 hours BMET  Recent Labs Lab 12/30/13 0558 01/01/14 1510 01/02/14 0445  NA 140 138 135*  K 3.5* 3.3* 4.8  CL 105 97 98  CO2 24 24 25   BUN 12 9 16   CREATININE 0.72 0.86 0.81  GLUCOSE 209* 228* 312*   Electrolytes  Recent Labs Lab 12/28/13 0133  12/30/13 0558 01/01/14 1510 01/02/14 0445  CALCIUM  --   < >  8.6 8.3* 7.6*  MG 1.4*  --   --   --   --   < > = values in this interval not displayed. Sepsis Markers  Recent Labs Lab 01/01/14 1518 01/01/14 1530 01/02/14 0455  LATICACIDVEN 2.59*  --   --   PROCALCITON  --  <0.10 0.35   ABG  Recent Labs Lab 01/01/14 1425 01/01/14 1745  PHART 7.260* 7.208*  PCO2ART 59.0* 69.0*  PO2ART 339.0* 232.0*   Liver Enzymes  Recent Labs Lab 01/01/14 1510  AST 22  ALT 20  ALKPHOS 88  BILITOT <0.2*  ALBUMIN 3.9   Cardiac Enzymes No results found for this basename: TROPONINI, PROBNP,  in the last 168 hours Glucose  Recent Labs Lab 12/29/13 2118 12/30/13 0813 01/01/14 1943  01/01/14 2358 01/02/14 0418 01/02/14 0744  GLUCAP 208* 188* 310* 329* 296* 270*   Dg Chest Port 1 View  01/02/2014   CLINICAL DATA:  Endotracheal tube position  EXAM: PORTABLE CHEST - 1 VIEW  COMPARISON:  Prior radiograph from 02/30/2015  FINDINGS: The tip of the endotracheal tube is positioned 3 cm above the carinal. Enteric tube courses into the abdomen. Left-sided central venous catheter is unchanged. Transverse heart size appears increase, due to accentuation on AP technique.  Lungs are hypoinflated. There is mild diffuse pulmonary vascular congestion without frank pulmonary edema. No focal infiltrate. No pleural effusion or pneumothorax.  Osseous structures are unchanged.  IMPRESSION: 1. Tip of the endotracheal tube 3 cm above the carina. 2. Shallow lung inflation with mild diffuse pulmonary vascular congestion without overt pulmonary edema.   Electronically Signed   By: Jeannine Boga M.D.   On: 01/02/2014 06:34   Dg Chest Port 1 View  01/01/2014   CLINICAL DATA:  Assess positioning of the endotracheal tube and central line  EXAM: PORTABLE CHEST - 1 VIEW  COMPARISON:  DG CHEST 1V PORT dated 01/01/2014  FINDINGS: The endotracheal tube tip lies approximately 3.9 cm above the crotch of the carina. The left internal jugular venous catheter tip lies in the region of the junction of the right and left brachiocephalic veins. The lungs are well-expanded. There is no evidence of a postprocedure pneumothorax. The cardiopericardial silhouette is normal in size. There is no pleural effusion . The observed portions of the bony thorax appear normal.  IMPRESSION: 1. The endotracheal tube and left subclavian venous catheter are in reasonable position. 2. There is no evidence of postprocedure complication. 3. There is no evidence of active cardiopulmonary disease.   Electronically Signed   By: David  Martinique   On: 01/01/2014 16:45   Dg Chest Portable 1 View  01/01/2014   CLINICAL DATA:  Shortness of breath  EXAM:  PORTABLE CHEST - 1 VIEW  COMPARISON:  12/28/2013  FINDINGS: The heart size and mediastinal contours are within normal limits. Both lungs are clear. The visualized skeletal structures are unremarkable.  IMPRESSION: No active disease.   Electronically Signed   By: Skipper Cliche M.D.   On: 01/01/2014 14:48       ASSESSMENT / PLAN:  PULMONARY A: Acute respiratory failure Chronic poorly controlled asthma  Given hx of nasal polyps and ASA sensitivity, she is likely to benefit from leukotriene inhibitor Status asthmaticus Suspect component of VCD Note: prolonged exp phase after intubation P:   Full vent support Permissive hypercapnea Scheduled BDs Scheduled syst steroids Sedation protocol w/ prop/fent NMB at least through night 2/3 Added Montelukast    CARDIOVASCULAR A: HTN, reactive Tachycardia, reactive P:  PRN hydralazine to maintain SBP < 170 mmHg KVO IVF Get echo   RENAL A:  No acute  P:   Monitor BMET intermittently Monitor I/Os Correct electrolytes as indicated   GASTROINTESTINAL A:  Morbid obesity  GERD, chronic PPI use P:   SUP: IV PPI  OGT ordered Consider TFs 2/04   HEMATOLOGIC A:  Chronic anemia, h/o FESO4 def  No evidence of bleeding   P:  DVT px: [place] heparin  Trend CBC   INFECTIOUS A: No overt infection  P:   Ck PCT-->.35 +mrsa swab resp viral panel and culture  2/4 no abx at this time  ENDOCRINE CBG (last 3)   Recent Labs  01/01/14 2358 01/02/14 0418 01/02/14 0744  GLUCAP 329* 296* 270*     A:  DM II Hyperglycemia  P:   Lantus and resistant SSI ordered 2/4 increased lantus NEUROLOGIC A:  ICU associated agitation/discomfort  P:   Propfol/fentanyl  Cisatracurium until  BIS monitoring while on NMBs  TODAY'S SUMMARY:   Will dc nmb and follow changes on vent. No abx at this time. She has a small wheeze. Check UDS for completeness.  40 minutes CC time  [facility] ACNP Maryanna Shape PCCM Pager [phone] till 3 pm If no  answer page [phone] 01/02/2014, 10:15 AM  Baltazar Apo, MD, PhD 01/02/2014, 3:04 PM [place] [facility] [phone] or if no answer [phone]

## 2014-01-02 NOTE — Progress Notes (Signed)
CARE MANAGEMENT NOTE 01/02/2014  Patient:  [name],[name]   Account Number:  [id number]  Date Initiated:  01/02/2014  Documentation initiated by:  DAVIS,RHONDA  Subjective/Objective Assessment:   asthmatic placed on bipap failed and required intubation and full vent support     Action/Plan:   from home will follow to see  progress and needs   Anticipated DC Date:  01/04/2014   Anticipated DC Plan:  HOME/SELF CARE  In-house referral  NA      DC Planning Services  NA      PAC Choice  NA   Choice offered to / List presented to:  NA   DME arranged  NA      DME agency  NA     [place] arranged  NA      [place] agency  NA   Status of service:  In process, will continue to follow Medicare Important Message given?  NA - LOS <3 / Initial given by admissions (If response is "NO", the following Medicare IM given date fields will be blank) Date Medicare IM given:   Date Additional Medicare IM given:    Discharge Disposition:    Per UR Regulation:  Reviewed for med. necessity/level of care/duration of stay  If discussed at [place] of Stay Meetings, dates discussed:    Comments:  [id number]/Rhonda Eldridge Dace, [place], [place] [phone] Chart Reviewed for discharge and hospital needs. Discharge needs at time of review:  None present will follow for needs. Review of patient progress due on [id number].

## 2014-01-02 NOTE — Progress Notes (Signed)
Inpatient Diabetes Program Recommendations  AACE/ADA: New Consensus Statement on Inpatient Glycemic Control (2013)  Target Ranges:  Prepandial:   less than 140 mg/dL      Peak postprandial:   less than 180 mg/dL (1-2 hours)      Critically ill patients:  140 - 180 mg/dL   Reason for Visit: Hyperglycemia  Diabetes history: Type 2 DM Outpatient Diabetes medications: Metformin 500 bid Current orders for Inpatient glycemic control: Lantus 25 QHS and Novolog resistant Q4 hours  Inpatient Diabetes Program Recommendations Insulin - IV drip/GlucoStabilizer: Recommend ICU Hyperglycemia Protocol  Note: Will need CHO coverage for TFs.  Novolog 3 units Q4 hours. Will continue to follow. Thank you. Lorenda Peck, RD, LDN, CDE Inpatient Diabetes Coordinator [phone]

## 2014-01-02 NOTE — Progress Notes (Signed)
Echocardiogram 2D Echocardiogram has been performed.  [name] [name] 01/02/2014, 10:47 AM 

## 2014-01-02 NOTE — Progress Notes (Signed)
INITIAL NUTRITION ASSESSMENT  DOCUMENTATION CODES Per approved criteria  -Morbid Obesity   INTERVENTION: - Initiate TF via OGT of Vital High Protein at 83ml/hr which will provide 360 calories, 32g protein, 360ml free water and meet 16% estimated calorie needs and 22% estimated protein needs. TF plus current calories from Propofol will provide a total of 1572 calories and meet 69% estimated calorie needs which is within goal of enteral nutrition to provide 60-70% of estimated calorie needs based on ASPEN guidelines for permissive underfeeding in critically ill obese individuals - Initiate adult enteral protocol - Will continue to monitor   NUTRITION DIAGNOSIS: Inadequate oral intanke related to inability to eat as evidenced by NPO, mechanical ventilation.    Goal: Enteral nutrition to provide 60-70% of estimated calorie needs (22-25 kcals/kg ideal body weight) and 100% of estimated protein needs, based on ASPEN guidelines for permissive underfeeding in critically ill obese individuals.  Monitor:  Weights, labs, TF tolerance/advancement, vent status  Reason for Assessment: Ventilated pt, low braden  38 y.o. female  Admitting Dx: Asthma with status asthmaticus  ASSESSMENT: Admitted with shortness of breath and chest tightness, has hx of asthma. Pt failed non-invasive ventilation and was intubated in ED for respiratory insufficiency. Per conversation with NP, RD to manage TF. Pt alone in room.   Patient is currently intubated on ventilator support.  MV: 6.7 L/min Temp (24hrs), Avg:98.2 F (36.8 C), Min:97.3 F (36.3 C), Max:99.7 F (37.6 C)  Propofol: 45.9 ml/hr - provides 1212 calories    Height: Ht Readings from Last 1 Encounters:  01/01/14 5\' 6"  (1.676 m)    Weight: Wt Readings from Last 1 Encounters:  01/01/14 306 lb 14.1 oz (139.2 kg)    Ideal Body Weight: 130 lb  % Ideal Body Weight: 235%  Wt Readings from Last 10 Encounters:  01/01/14 306 lb 14.1 oz (139.2 kg)   12/28/13 306 lb 14.1 oz (139.2 kg)  11/01/13 300 lb (136.079 kg)  10/18/13 298 lb 9.6 oz (135.444 kg)  10/05/13 297 lb 2.9 oz (134.8 kg)  08/24/13 288 lb 12.8 oz (131 kg)  07/24/13 294 lb (133.358 kg)  05/28/13 294 lb 1.5 oz (133.4 kg)  03/15/13 301 lb (136.533 kg)  02/08/13 302 lb (136.986 kg)    Usual Body Weight: 306 lb last month  % Usual Body Weight: 100%  BMI:  Body mass index is 49.56 kg/(m^2). Class III extreme obesity  Estimated Nutritional Needs: Kcal: 2281 Protein: 148g Fluid: >2.2L/day   Skin: +1 generalized edema, +1 RLE, LLE edema  Diet Order:  NPO  EDUCATION NEEDS: -No education needs identified at this time   Intake/Output Summary (Last 24 hours) at 01/02/14 0925 Last data filed at 01/02/14 0800  Gross per 24 hour  Intake 1354.83 ml  Output   1915 ml  Net -560.17 ml    Last BM: PTA  Labs:   Recent Labs Lab 12/28/13 0133  12/30/13 0558 01/01/14 1510 01/02/14 0445  NA  --   < > 140 138 135*  K  --   < > 3.5* 3.3* 4.8  CL  --   < > 105 97 98  CO2  --   < > 24 24 25   BUN  --   < > 12 9 16   CREATININE  --   < > 0.72 0.86 0.81  CALCIUM  --   < > 8.6 8.3* 7.6*  MG 1.4*  --   --   --   --   GLUCOSE  --   < >  209* 228* 312*  < > = values in this interval not displayed.  CBG (last 3)   Recent Labs  01/01/14 2358 01/02/14 0418 01/02/14 0744  GLUCAP 329* 296* 270*    Scheduled Meds: . albuterol  2.5 mg Nebulization Q6H  . antiseptic oral rinse  15 mL Mouth Rinse QID  . artificial tears   Both Eyes Q8H  . budesonide  0.25 mg Nebulization Q6H  . chlorhexidine  15 mL Mouth Rinse BID  . cisatracurium  0.05 mg/kg Intravenous Once  . fluticasone  2 spray Each Nare Daily  . heparin  5,000 Units Subcutaneous Q8H  . insulin aspart  0-20 Units Subcutaneous Q4H  . insulin glargine  20 Units Subcutaneous QHS  . ipratropium  0.5 mg Nebulization Q6H  . methylPREDNISolone (SOLU-MEDROL) injection  60 mg Intravenous Q6H  . montelukast  10 mg Per  Tube QHS  . pantoprazole (PROTONIX) IV  40 mg Intravenous Q12H    Continuous Infusions: . sodium chloride 50 mL/hr at 01/01/14 1759  . cisatracurium (NIMBEX) infusion 3 mcg/kg/min (01/02/14 0250)  . fentaNYL infusion INTRAVENOUS 200 mcg/hr (01/02/14 0700)  . propofol 50 mcg/kg/min (01/02/14 7564)    Past Medical History  Diagnosis Date  . Asthma   . Hypertension   . Chronic anemia   . Abnormal TSH   . Morbid obesity   . GERD (gastroesophageal reflux disease)   . Seasonal allergies   . Nasal polyps   . Diabetes mellitus     type 2  . Chronic bronchitis   . Sleep apnea   . Shortness of breath 12/14/11    "related to how bad my asthma is; sometimes @ rest, lying down, w/exertion"  . Pneumonia   . Blood transfusion   . Inguinal hernia unilateral, non-recurrent     right  . Kidney infection 2008  . Headache(784.0)   . Anxiety   . Depression   . PTSD (post-traumatic stress disorder)     "related to prior hospitalizations; things that happened when I was in hospital"  . Asthma     Past Surgical History  Procedure Laterality Date  . Cervical cerclage  1999; 2002  . Tubal ligation  2002  . Cervical cerclage    . Tubal ligation      Mikey College MS, [facility], LDN [phone] Pager [phone] After Hours Pager

## 2014-01-03 ENCOUNTER — Inpatient Hospital Stay (HOSPITAL_COMMUNITY): Payer: Medicare Other

## 2014-01-03 DIAGNOSIS — G4733 Obstructive sleep apnea (adult) (pediatric): Secondary | ICD-10-CM

## 2014-01-03 LAB — RESPIRATORY VIRUS PANEL
Adenovirus: NOT DETECTED
INFLUENZA A: NOT DETECTED
Influenza A H1: NOT DETECTED
Influenza A H3: NOT DETECTED
Influenza B: NOT DETECTED
Metapneumovirus: NOT DETECTED
PARAINFLUENZA 1 A: NOT DETECTED
PARAINFLUENZA 2 A: NOT DETECTED
Parainfluenza 3: NOT DETECTED
RESPIRATORY SYNCYTIAL VIRUS B: NOT DETECTED
Respiratory Syncytial Virus A: NOT DETECTED
Rhinovirus: NOT DETECTED

## 2014-01-03 LAB — CBC
HEMATOCRIT: 30.5 % — AB (ref 36.0–46.0)
HEMOGLOBIN: 8.4 g/dL — AB (ref 12.0–15.0)
MCH: 18.9 pg — ABNORMAL LOW (ref 26.0–34.0)
MCHC: 27.5 g/dL — AB (ref 30.0–36.0)
MCV: 68.5 fL — AB (ref 78.0–100.0)
Platelets: 385 10*3/uL (ref 150–400)
RBC: 4.45 MIL/uL (ref 3.87–5.11)
RDW: 20.5 % — AB (ref 11.5–15.5)
WBC: 14.3 10*3/uL — ABNORMAL HIGH (ref 4.0–10.5)

## 2014-01-03 LAB — GLUCOSE, CAPILLARY
GLUCOSE-CAPILLARY: 276 mg/dL — AB (ref 70–99)
GLUCOSE-CAPILLARY: 206 mg/dL — AB (ref 70–99)
Glucose-Capillary: 238 mg/dL — ABNORMAL HIGH (ref 70–99)
Glucose-Capillary: 242 mg/dL — ABNORMAL HIGH (ref 70–99)
Glucose-Capillary: 228 mg/dL — ABNORMAL HIGH (ref 70–99)
Glucose-Capillary: 261 mg/dL — ABNORMAL HIGH (ref 70–99)

## 2014-01-03 LAB — BLOOD GAS, ARTERIAL
Acid-Base Excess: 0.3 mmol/L (ref 0.0–2.0)
BICARBONATE: 24.8 meq/L — AB (ref 20.0–24.0)
Drawn by: 308601
FIO2: 0.3 %
MECHVT: 550 mL
O2 Saturation: 94.8 %
PATIENT TEMPERATURE: 36.4
PCO2 ART: 40.9 mmHg (ref 35.0–45.0)
PEEP: 5 cmH2O
PH ART: 7.397 (ref 7.350–7.450)
PO2 ART: 70.8 mmHg — AB (ref 80.0–100.0)
RATE: 18 resp/min
TCO2: 23.5 mmol/L (ref 0–100)

## 2014-01-03 LAB — BASIC METABOLIC PANEL
BUN: 15 mg/dL (ref 6–23)
CALCIUM: 8 mg/dL — AB (ref 8.4–10.5)
CO2: 26 mEq/L (ref 19–32)
Chloride: 103 mEq/L (ref 96–112)
Creatinine, Ser: 0.7 mg/dL (ref 0.50–1.10)
Glucose, Bld: 269 mg/dL — ABNORMAL HIGH (ref 70–99)
Potassium: 3.9 mEq/L (ref 3.7–5.3)
Sodium: 138 mEq/L (ref 137–147)

## 2014-01-03 LAB — PROCALCITONIN: PROCALCITONIN: 0.31 ng/mL

## 2014-01-03 MED ORDER — GLUCERNA SHAKE PO LIQD
237.0000 mL | Freq: Two times a day (BID) | ORAL | Status: DC
  Administered 2014-01-03: 237 mL via ORAL
  Filled 2014-01-03 (×3): qty 237

## 2014-01-03 MED ORDER — PANTOPRAZOLE SODIUM 40 MG PO TBEC
40.0000 mg | DELAYED_RELEASE_TABLET | Freq: Two times a day (BID) | ORAL | Status: DC
  Administered 2014-01-03 – 2014-01-06 (×6): 40 mg via ORAL
  Filled 2014-01-03 (×10): qty 1

## 2014-01-03 MED ORDER — CHLORHEXIDINE GLUCONATE CLOTH 2 % EX PADS: 6.0000 | MEDICATED_PAD | Freq: Every day | CUTANEOUS | Status: DC

## 2014-01-03 MED ORDER — MUPIROCIN 2 % EX OINT
1.0000 "application " | TOPICAL_OINTMENT | Freq: Two times a day (BID) | CUTANEOUS | Status: DC
  Administered 2014-01-03 – 2014-01-06 (×7): 1 via NASAL
  Filled 2014-01-03: qty 22
  Filled 2014-01-03: qty 44

## 2014-01-03 MED ORDER — PREDNISONE 20 MG PO TABS
40.0000 mg | ORAL_TABLET | Freq: Every day | ORAL | Status: DC
  Administered 2014-01-03 – 2014-01-06 (×4): 40 mg via ORAL
  Filled 2014-01-03 (×6): qty 2

## 2014-01-03 MED ORDER — BUDESONIDE 0.25 MG/2ML IN SUSP
0.2500 mg | Freq: Two times a day (BID) | RESPIRATORY_TRACT | Status: DC
  Administered 2014-01-03 – 2014-01-04 (×2): 0.25 mg via RESPIRATORY_TRACT
  Filled 2014-01-03 (×5): qty 2

## 2014-01-03 NOTE — Progress Notes (Signed)
Inpatient Diabetes Program Recommendations  AACE/ADA: New Consensus Statement on Inpatient Glycemic Control (2013)  Target Ranges:  Prepandial:   less than 140 mg/dL      Peak postprandial:   less than 180 mg/dL (1-2 hours)      Critically ill patients:  140 - 180 mg/dL   Reason for Visit: Hyperglycemia Results for [name] [name], [name] [name] (MRN [id number]) as of 01/03/2014 13:28  Ref. Range 01/02/2014 07:44 01/02/2014 11:34 01/02/2014 15:56 01/02/2014 19:51 01/02/2014 23:04 01/03/2014 03:34 01/03/2014 07:38 01/03/2014 11:40  Glucose-Capillary Latest Range: 70-99 mg/dL 270 (H) 229 (H) 220 (H) 248 (H) 238 (H) 242 (H) 276 (H) 206 (H)     Inpatient Diabetes Program Recommendations Insulin - IV drip/GlucoStabilizer: . Insulin - Basal: Increase Lantus to 30 units QHS Correction (SSI): Novolog resistant tidwc and hs Insulin - Meal Coverage: Add Novolog 6 units tidwc for meal coverage insulin HgbA1C: 7.8% Diet: CHO mod med  Note: Will continue to follow. If pt is to go home on insulin, will order insulin starter kit and begin teaching insulin administration.  Thank you. Lorenda Peck, RD, LDN, CDE Inpatient Diabetes Coordinator [phone]

## 2014-01-03 NOTE — Progress Notes (Signed)
NUTRITION FOLLOW UP  Intervention:   - Glucerna shakes BID - Encouraged increased meal intake as sore throat resolves - Will continue to monitor   Nutrition Dx:   Inadequate oral intanke related to inability to eat as evidenced by NPO, mechanical ventilation - ongoing but now related to sore throat as evidenced by <25% meal intake.    Goal:   Enteral nutrition to provide 60-70% of estimated calorie needs (22-25 kcals/kg ideal body weight) and 100% of estimated protein needs, based on ASPEN guidelines for permissive underfeeding in critically ill obese individuals - not met, pt no longer on TF  New goal Pt to consume >90% of meals/supplements   Monitor:   Weights, labs, intake  Assessment:   Admitted with shortness of breath and chest tightness, has hx of asthma. Pt failed non-invasive ventilation and was intubated in ED for respiratory insufficiency.   2/4 - Per conversation with NP, RD to manage TF. Pt alone in room and intubated. Pt started on TF via OGT of Vital High Protein at 29m/hr which will provide 360 calories, 32g protein, 3035mfree water and meet 16% estimated calorie needs and 22% estimated protein needs. TF plus current calories from Propofol will provide a total of 1572 calories and meet 69% estimated calorie needs which is within goal of enteral nutrition to provide 60-70% of estimated calorie needs based on ASPEN guidelines for permissive underfeeding in critically ill obese individuals  2/5 - Pt self-extubated today, started on diabetic diet, no longer on TF. Met with pt who c/o sore throat, had not touched lunch tray. Reports eating well at home, not following strict diabetic diet, however denied any educational needs.   Lab Results  Component Value Date   HGBA1C 7.8* 12/28/2013     Height: Ht Readings from Last 1 Encounters:  01/01/14 _0  (1.676 m)    Weight Status:   Wt Readings from Last 1 Encounters:  01/03/14 303 lb 2.1 oz (137.5 kg)  Admit wt:         306 lb 14.1 oz (139.2 kg)    Re-estimated needs:  Kcal: 1650-1800 Protein: 70-80g Fluid: 1.6-1.8L/day  Skin: +1 generalized edema, + 1 RLE, LLE edema  Diet Order: Carb Control   Intake/Output Summary (Last 24 hours) at 01/03/14 1525 Last data filed at 01/03/14 1100  Gross per 24 hour  Intake 1559.15 ml  Output   2000 ml  Net -440.85 ml    Last BM: PTA   Labs:   Recent Labs Lab 12/28/13 0133  01/01/14 1510 01/02/14 0445 01/03/14 0449  NA  --   < > 138 135* 138  K  --   < > 3.3* 4.8 3.9  CL  --   < > 97 98 103  CO2  --   < > _1 BUN  --   < > _2 CREATININE  --   < > 0.86 0.81 0.70  CALCIUM  --   < > 8.3* 7.6* 8.0*  MG 1.4*  --   --   --   --   GLUCOSE  --   < > 228* 312* 269*  < > = values in this interval not displayed.  CBG (last 3)   Recent Labs  01/03/14 0334 01/03/14 0738 01/03/14 1140  GLUCAP 242* 276* 206*    Scheduled Meds: . budesonide  0.25 mg Nebulization BID  . fluticasone  2 spray Each Nare Daily  . heparin  5,000 Units  Subcutaneous Q8H  . insulin aspart  0-20 Units Subcutaneous Q4H  . insulin glargine  25 Units Subcutaneous QHS  . ipratropium-albuterol  3 mL Nebulization Q6H  . montelukast  10 mg Per Tube QHS  . mupirocin ointment  1 application Nasal BID  . pantoprazole  40 mg Oral BID WC  . predniSONE  40 mg Oral Q breakfast     Mikey College MS, RD, LDN [phone] Pager [phone] After Hours Pager

## 2014-01-03 NOTE — Progress Notes (Signed)
Narcotic Waste  wasted 62ml of 259ml bag (concentration 77mcg/ml)  Witness Darlina Sicilian NP-c

## 2014-01-03 NOTE — Progress Notes (Signed)
RN calling because of high HR  Was 120 and then give hydralzzing and now HR 140 - sinus on monitor; Camera care: looks well  A Reflex tachycardia in partial due to hydralazine  P Monitpr REasess  Dr. Brand Males, M.D., [facility].C.P Pulmonary and Critical Care Medicine Staff Physician [facility] Pager: [phone], If no answer or between  15:00h - 7:00h: call [phone]  01/03/2014 8:56 PM

## 2014-01-03 NOTE — Progress Notes (Signed)
Name: Yaslyn Mahrt MRN: [id number] DOB: 08-05-1976    ADMISSION DATE:  01/01/2014 CONSULTATION DATE:  2/3 PRIMARY SERVICE: PCCM   CHIEF COMPLAINT:  Acute resp failure   BRIEF PATIENT DESCRIPTION:  38 F pt of MW with difficult to control asthma complicated by what we felt to be significant upper airway component. Was just hospitalized 1/30 - 2/1 for acute asthmatic exacerbation c/b poorly controlled HTN. Admitted via ER 2/3 w/ sudden onset of resp distress, hypercarbia, severe agitation, hypertension. Failed non-invasive ventilation and intubated by PCCM in ED  SIGNIFICANT EVENTS / STUDIES:  LE [place] 2/3 >>  No DVT TTE 2/4 >> mild LVH, EF 65-70%, diastolic dysfxn  LINES / TUBES: ETT 2/3 >> 2/5 Left CVL 2/3 >>  Radial A line rt radial 2/03 >>   CULTURES: Urine strep Ag 2/03 >> negative Resp 2/3 >> RVP 2/3 >> mrsa + 2/3 Urine 2/3 >> negative  ANTIBIOTICS: None   HISTORY OF PRESENT ILLNESS:   This is a 38 year old female previously f/b Wert for difficult to control asthma complicated by what we felt to be significant upper airway component. Was just d/c on 2/1 after being discharged for acute asthmatic exacerbation c/b poorly controlled HTN. Went home w/ pred taper, abx, and typical resp regimen of LABA/ICS. Developed sudden onset of acute resp distress on 2/3. Presented to the ER. Had marked upper-airway wheeze on presentation. Failed non-invasive ventilation, cont-neb and anxiolytics. Hypoxic off BIPAP. Intubated. Remained significantly bronchospastic even after intubation. Was sedated and admitted to the intensive care.   SUBJECTIVE:  Awake and interacting post-extubation  VITAL SIGNS: Temp:  [97.3 F (36.3 C)-98.8 F (37.1 C)] 97.5 F (36.4 C) (02/05 0800) Pulse Rate:  [77-115] 115 (02/05 0918) Resp:  [0-18] 12 (02/05 0918) BP: (91-147)/(49-118) 139/89 mmHg (02/05 0918) SpO2:  [94 %-100 %] 97 % (02/05 0918) Arterial Line BP: (112-148)/(81-138) 124/115 mmHg (02/05  0300) FiO2 (%):  [30 %-40 %] 30 % (02/05 0918) Weight:  [137.5 kg (303 lb 2.1 oz)] 137.5 kg (303 lb 2.1 oz) (02/05 0548) HEMODYNAMICS:   VENTILATOR SETTINGS: Vent Mode:  [-] PSV;CPAP FiO2 (%):  [30 %-40 %] 30 % Set Rate:  [18 bmp] 18 bmp Vt Set:  [550 mL] 550 mL PEEP:  [5 cmH20] 5 cmH20 Pressure Support:  [5 cmH20] 5 cmH20 Plateau Pressure:  [22 cmH20-23 cmH20] 22 cmH20 INTAKE / OUTPUT: Intake/Output     02/04 0701 - 02/05 0700 02/05 0701 - 02/06 0700   I.V. (mL/kg) 2382.3 (17.3) 35 (0.3)   Other  60   Total Intake(mL/kg) 2382.3 (17.3) 95 (0.7)   Urine (mL/kg/hr) 2570 (0.8) 150 (0.3)   Total Output 2570 150   Net -187.7 -55          PHYSICAL EXAMINATION: General: obese woman, slightly confused about recent events Neuro:  Awake and oriented, moves all ext HEENT:  Neck large, stridor and hoarse voice Cardiovascular:  rrr Lungs:  Distant, some referred UA exp noise Abdomen:  Obese  Ext: warm, no edema Skin:  No lesions  LABS:  CBC  Recent Labs Lab 01/01/14 1510 01/02/14 0445 01/03/14 0449  WBC 23.5* 18.5* 14.3*  HGB 10.4* 8.9* 8.4*  HCT 35.1* 32.3* 30.5*  PLT 501* 359 385   Coag's No results found for this basename: APTT, INR,  in the last 168 hours BMET  Recent Labs Lab 01/01/14 1510 01/02/14 0445 01/03/14 0449  NA 138 135* 138  K 3.3* 4.8 3.9  CL 97  98 103  CO2 24 25 26   BUN 9 16 15   CREATININE 0.86 0.81 0.70  GLUCOSE 228* 312* 269*   Electrolytes  Recent Labs Lab 12/28/13 0133  01/01/14 1510 01/02/14 0445 01/03/14 0449  CALCIUM  --   < > 8.3* 7.6* 8.0*  MG 1.4*  --   --   --   --   < > = values in this interval not displayed. Sepsis Markers  Recent Labs Lab 01/01/14 1518 01/01/14 1530 01/02/14 0455 01/03/14 0450  LATICACIDVEN 2.59*  --   --   --   PROCALCITON  --  <0.10 0.35 0.31   ABG  Recent Labs Lab 01/01/14 1745 01/02/14 1115 01/03/14 0348  PHART 7.208* 7.250* 7.397  PCO2ART 69.0* 59.8* 40.9  PO2ART 232.0* 92.8 70.8*    Liver Enzymes  Recent Labs Lab 01/01/14 1510  AST 22  ALT 20  ALKPHOS 88  BILITOT <0.2*  ALBUMIN 3.9   Cardiac Enzymes No results found for this basename: TROPONINI, PROBNP,  in the last 168 hours Glucose  Recent Labs Lab 01/02/14 1134 01/02/14 1556 01/02/14 1951 01/02/14 2304 01/03/14 0334 01/03/14 0738  GLUCAP 229* 220* 248* 238* 242* 276*   Dg Chest Port 1 View  01/03/2014   CLINICAL DATA:  Intubation.  EXAM: PORTABLE CHEST - 1 VIEW  COMPARISON:  DG CHEST 1V PORT dated 01/02/2014.  FINDINGS: Endotracheal tube 1 cm above the carina, slight proximal repositioning may prove useful. Left subclavian central line noted with tip projected over superior vena cava. NG tube noted with tip below left hemidiaphragm. Cardiomegaly with mild pulmonary vascular prominence. Lungs are clear. No evidence of infiltrate. No pleural effusion or pneumothorax. No acute osseous abnormality.  IMPRESSION: 1. Endotracheal tube noted 1 cm above the carina. Proximal repositioning may prove useful. 2. Cardiomegaly with mild pulmonary vascular prominence, no pulmonary edema or focal pulmonary infiltrate.   Electronically Signed   By: Marcello Moores  Register   On: 01/03/2014 07:37   Dg Chest Port 1 View  01/02/2014   CLINICAL DATA:  Endotracheal tube position  EXAM: PORTABLE CHEST - 1 VIEW  COMPARISON:  Prior radiograph from 02/30/2015  FINDINGS: The tip of the endotracheal tube is positioned 3 cm above the carinal. Enteric tube courses into the abdomen. Left-sided central venous catheter is unchanged. Transverse heart size appears increase, due to accentuation on AP technique.  Lungs are hypoinflated. There is mild diffuse pulmonary vascular congestion without frank pulmonary edema. No focal infiltrate. No pleural effusion or pneumothorax.  Osseous structures are unchanged.  IMPRESSION: 1. Tip of the endotracheal tube 3 cm above the carina. 2. Shallow lung inflation with mild diffuse pulmonary vascular congestion without  overt pulmonary edema.   Electronically Signed   By: Jeannine Boga M.D.   On: 01/02/2014 06:34   Dg Chest Port 1 View  01/01/2014   CLINICAL DATA:  Assess positioning of the endotracheal tube and central line  EXAM: PORTABLE CHEST - 1 VIEW  COMPARISON:  DG CHEST 1V PORT dated 01/01/2014  FINDINGS: The endotracheal tube tip lies approximately 3.9 cm above the crotch of the carina. The left internal jugular venous catheter tip lies in the region of the junction of the right and left brachiocephalic veins. The lungs are well-expanded. There is no evidence of a postprocedure pneumothorax. The cardiopericardial silhouette is normal in size. There is no pleural effusion . The observed portions of the bony thorax appear normal.  IMPRESSION: 1. The endotracheal tube and left subclavian venous catheter  are in reasonable position. 2. There is no evidence of postprocedure complication. 3. There is no evidence of active cardiopulmonary disease.   Electronically Signed   By: David  [place]   On: 01/01/2014 16:45   Dg Chest Portable 1 View  01/01/2014   CLINICAL DATA:  Shortness of breath  EXAM: PORTABLE CHEST - 1 VIEW  COMPARISON:  12/28/2013  FINDINGS: The heart size and mediastinal contours are within normal limits. Both lungs are clear. The visualized skeletal structures are unremarkable.  IMPRESSION: No active disease.   Electronically Signed   By: Skipper Cliche M.D.   On: 01/01/2014 14:48       ASSESSMENT / PLAN:  PULMONARY A: Acute respiratory failure Chronic poorly controlled asthma  Given hx of nasal polyps and ASA sensitivity, she is likely to benefit from leukotriene inhibitor Status asthmaticus Suspect component of VCD Note: prolonged exp phase after intubation P:   Scheduled BDs Change solumedrol to pred Added Montelukast , continue nasal steroid  CARDIOVASCULAR A: HTN, reactive Tachycardia, reactive P:  PRN hydralazine to maintain SBP < 170 mmHg KVO IVF  RENAL A:  No acute  P:    Monitor BMET intermittently Monitor I/Os Correct electrolytes as indicated   GASTROINTESTINAL A:  Morbid obesity  GERD, chronic PPI use P:   SUP: IV PPI  Start diabetic diet   HEMATOLOGIC A:  Chronic anemia, h/o FESO4 def  No evidence of bleeding   P:  DVT px: Accident heparin  Trend CBC   INFECTIOUS A: No overt infection  P:   No abx at this time  ENDOCRINE CBG (last 3)   Recent Labs  01/02/14 2304 01/03/14 0334 01/03/14 0738  GLUCAP 238* 242* 276*     A:  DM II Hyperglycemia  P:   Lantus and resistant SSI ordered  NEUROLOGIC A:  ICU associated agitation/discomfort  P:   D/c sedating meds  TODAY'S SUMMARY:  VCD +/- asthma, extubated 2/5.   40 minutes CC time  Baltazar Apo, MD, PhD 01/03/2014, 10:08 AM [facility] [phone] or if no answer [phone]

## 2014-01-04 DIAGNOSIS — I10 Essential (primary) hypertension: Secondary | ICD-10-CM

## 2014-01-04 DIAGNOSIS — E119 Type 2 diabetes mellitus without complications: Secondary | ICD-10-CM

## 2014-01-04 LAB — GLUCOSE, CAPILLARY
GLUCOSE-CAPILLARY: 181 mg/dL — AB (ref 70–99)
GLUCOSE-CAPILLARY: 261 mg/dL — AB (ref 70–99)
GLUCOSE-CAPILLARY: 141 mg/dL — AB (ref 70–99)
Glucose-Capillary: 157 mg/dL — ABNORMAL HIGH (ref 70–99)
Glucose-Capillary: 185 mg/dL — ABNORMAL HIGH (ref 70–99)
Glucose-Capillary: 321 mg/dL — ABNORMAL HIGH (ref 70–99)

## 2014-01-04 LAB — BASIC METABOLIC PANEL
BUN: 19 mg/dL (ref 6–23)
CO2: 25 mEq/L (ref 19–32)
CREATININE: 0.72 mg/dL (ref 0.50–1.10)
Calcium: 8.7 mg/dL (ref 8.4–10.5)
Chloride: 106 mEq/L (ref 96–112)
Glucose, Bld: 187 mg/dL — ABNORMAL HIGH (ref 70–99)
POTASSIUM: 3.3 meq/L — AB (ref 3.7–5.3)
Sodium: 142 mEq/L (ref 137–147)

## 2014-01-04 MED ORDER — BENZOCAINE (TOPICAL) 20 % EX AERO
INHALATION_SPRAY | Freq: Four times a day (QID) | CUTANEOUS | Status: DC | PRN
  Filled 2014-01-04: qty 57

## 2014-01-04 MED ORDER — INSULIN ASPART 100 UNIT/ML ~~LOC~~ SOLN
0.0000 [IU] | Freq: Three times a day (TID) | SUBCUTANEOUS | Status: DC
  Administered 2014-01-04: 11 [IU] via SUBCUTANEOUS
  Administered 2014-01-05: 4 [IU] via SUBCUTANEOUS
  Administered 2014-01-05: 3 [IU] via SUBCUTANEOUS

## 2014-01-04 MED ORDER — IRBESARTAN 300 MG PO TABS
300.0000 mg | ORAL_TABLET | Freq: Every day | ORAL | Status: DC
  Administered 2014-01-04 – 2014-01-06 (×3): 300 mg via ORAL
  Filled 2014-01-04 (×3): qty 1

## 2014-01-04 MED ORDER — METFORMIN HCL 500 MG PO TABS
1000.0000 mg | ORAL_TABLET | Freq: Two times a day (BID) | ORAL | Status: DC
  Administered 2014-01-04 – 2014-01-06 (×4): 1000 mg via ORAL
  Filled 2014-01-04 (×6): qty 2

## 2014-01-04 MED ORDER — HYDROCODONE-HOMATROPINE 5-1.5 MG/5ML PO SYRP: 5.0000 mL | ORAL_SOLUTION | Freq: Four times a day (QID) | ORAL | Status: DC | PRN

## 2014-01-04 MED ORDER — PHENOL 1.4 % MT LIQD: 1.0000 | OROMUCOSAL | Status: DC | PRN

## 2014-01-04 MED ORDER — HYDROCHLOROTHIAZIDE 12.5 MG PO CAPS
12.5000 mg | ORAL_CAPSULE | Freq: Every day | ORAL | Status: DC
  Administered 2014-01-04 – 2014-01-06 (×3): 12.5 mg via ORAL
  Filled 2014-01-04 (×3): qty 1

## 2014-01-04 MED ORDER — IRBESARTAN-HYDROCHLOROTHIAZIDE 300-12.5 MG PO TABS: 1.0000 | ORAL_TABLET | Freq: Every day | ORAL | Status: DC

## 2014-01-04 MED ORDER — POTASSIUM CHLORIDE CRYS ER 20 MEQ PO TBCR
20.0000 meq | EXTENDED_RELEASE_TABLET | ORAL | Status: AC
  Administered 2014-01-04 (×2): 20 meq via ORAL
  Filled 2014-01-04 (×2): qty 1

## 2014-01-04 MED ORDER — ALBUTEROL SULFATE (2.5 MG/3ML) 0.083% IN NEBU
2.5000 mg | INHALATION_SOLUTION | RESPIRATORY_TRACT | Status: DC | PRN
  Administered 2014-01-05 – 2014-01-06 (×4): 2.5 mg via RESPIRATORY_TRACT
  Filled 2014-01-04 (×4): qty 3

## 2014-01-04 MED ORDER — INSULIN ASPART 100 UNIT/ML ~~LOC~~ SOLN: 0.0000 [IU] | Freq: Every day | SUBCUTANEOUS | Status: DC

## 2014-01-04 MED ORDER — BUDESONIDE-FORMOTEROL FUMARATE 160-4.5 MCG/ACT IN AERO
2.0000 | INHALATION_SPRAY | Freq: Two times a day (BID) | RESPIRATORY_TRACT | Status: DC
  Administered 2014-01-04 – 2014-01-06 (×5): 2 via RESPIRATORY_TRACT
  Filled 2014-01-04: qty 6

## 2014-01-04 NOTE — Progress Notes (Signed)
Name: [name] [name] MRN: [id number] DOB: September 25, 1976    ADMISSION DATE:  01/01/2014 CONSULTATION DATE:  2/3 PRIMARY SERVICE: PCCM   CHIEF COMPLAINT:  Acute resp failure   BRIEF PATIENT DESCRIPTION:  4 F pt of MW with difficult to control asthma complicated by what we felt to be significant upper airway component. Was just hospitalized 1/30 - 2/1 for acute asthmatic exacerbation c/b poorly controlled HTN. Admitted via ER 2/3 w/ sudden onset of resp distress, hypercarbia, severe agitation, hypertension. Failed non-invasive ventilation and intubated by PCCM in ED  SIGNIFICANT EVENTS / STUDIES:  LE [place] 2/3 >>  No DVT TTE 2/4 >> mild LVH, EF 00-92%, diastolic dysfxn  LINES / TUBES: ETT 2/3 >> 2/5 Left CVL 2/3 >>  Radial A line rt radial 2/03 >>   CULTURES: Urine strep Ag 2/03 >> negative Resp 2/3 >> did not produce RVP 2/3 >> negative mrsa + 2/3 Urine 2/3 >> negative  ANTIBIOTICS: None   HISTORY OF PRESENT ILLNESS:   This is a 38 year old female previously f/b Wert for difficult to control asthma complicated by what we felt to be significant upper airway component. Was just d/c on 2/1 after being discharged for acute asthmatic exacerbation c/b poorly controlled HTN. Went home w/ pred taper, abx, and typical resp regimen of LABA/ICS. Developed sudden onset of acute resp distress on 2/3. Presented to the ER. Had marked upper-airway wheeze on presentation. Failed non-invasive ventilation, cont-neb and anxiolytics. Hypoxic off BIPAP. Intubated. Remained significantly bronchospastic even after intubation. Was sedated and admitted to the intensive care.   SUBJECTIVE:  Coughing, hoarseness a bit better  VITAL SIGNS: Temp:  [97.7 F (36.5 C)-99.1 F (37.3 C)] 98.4 F (36.9 C) (02/06 0900) Pulse Rate:  [98-143] 100 (02/06 0900) Resp:  [13-46] 19 (02/06 0900) BP: (118-184)/(60-107) 167/102 mmHg (02/06 0900) SpO2:  [92 %-100 %] 97 % (02/06 0902) Weight:  [141.2 kg (311 lb 4.6 oz)]  141.2 kg (311 lb 4.6 oz) (02/06 0400) HEMODYNAMICS:   VENTILATOR SETTINGS:   INTAKE / OUTPUT: Intake/Output     02/05 0701 - 02/06 0700 02/06 0701 - 02/07 0700   P.O. 120    I.V. (mL/kg) 405 (2.9) 20 (0.1)   Other 60    Total Intake(mL/kg) 585 (4.1) 20 (0.1)   Urine (mL/kg/hr) 1265 (0.4)    Total Output 1265     Net -680 +20          PHYSICAL EXAMINATION: General: obese woman, slightly confused about recent events Neuro:  Awake and oriented, moves all ext HEENT:  Neck large, stridor and hoarse voice Cardiovascular:  rrr Lungs:  Distant, some referred UA exp noise Abdomen:  Obese  Ext: warm, no edema Skin:  No lesions  LABS:  CBC  Recent Labs Lab 01/01/14 1510 01/02/14 0445 01/03/14 0449  WBC 23.5* 18.5* 14.3*  HGB 10.4* 8.9* 8.4*  HCT 35.1* 32.3* 30.5*  PLT 501* 359 385   Coag's No results found for this basename: APTT, INR,  in the last 168 hours BMET  Recent Labs Lab 01/02/14 0445 01/03/14 0449 01/04/14 0400  NA 135* 138 142  K 4.8 3.9 3.3*  CL 98 103 106  CO2 25 26 25   BUN 16 15 19   CREATININE 0.81 0.70 0.72  GLUCOSE 312* 269* 187*   Electrolytes  Recent Labs Lab 01/02/14 0445 01/03/14 0449 01/04/14 0400  CALCIUM 7.6* 8.0* 8.7   Sepsis Markers  Recent Labs Lab 01/01/14 1518 01/01/14 1530 01/02/14 0455  01/03/14 0450  LATICACIDVEN 2.59*  --   --   --   PROCALCITON  --  <0.10 0.35 0.31   ABG  Recent Labs Lab 01/01/14 1745 01/02/14 1115 01/03/14 0348  PHART 7.208* 7.250* 7.397  PCO2ART 69.0* 59.8* 40.9  PO2ART 232.0* 92.8 70.8*   Liver Enzymes  Recent Labs Lab 01/01/14 1510  AST 22  ALT 20  ALKPHOS 88  BILITOT <0.2*  ALBUMIN 3.9   Cardiac Enzymes No results found for this basename: TROPONINI, PROBNP,  in the last 168 hours Glucose  Recent Labs Lab 01/03/14 1140 01/03/14 1622 01/03/14 2012 01/03/14 2349 01/04/14 0337 01/04/14 0806  GLUCAP 206* 228* 261* 321* 185* 157*   Dg Chest Port 1 View  01/03/2014    CLINICAL DATA:  Intubation.  EXAM: PORTABLE CHEST - 1 VIEW  COMPARISON:  DG CHEST 1V PORT dated 01/02/2014.  FINDINGS: Endotracheal tube 1 cm above the carina, slight proximal repositioning may prove useful. Left subclavian central line noted with tip projected over superior vena cava. NG tube noted with tip below left hemidiaphragm. Cardiomegaly with mild pulmonary vascular prominence. Lungs are clear. No evidence of infiltrate. No pleural effusion or pneumothorax. No acute osseous abnormality.  IMPRESSION: 1. Endotracheal tube noted 1 cm above the carina. Proximal repositioning may prove useful. 2. Cardiomegaly with mild pulmonary vascular prominence, no pulmonary edema or focal pulmonary infiltrate.   Electronically Signed   By: Marcello Moores  Register   On: 01/03/2014 07:37       ASSESSMENT / PLAN:  PULMONARY A: Acute respiratory failure Chronic poorly controlled asthma  Given hx of nasal polyps and ASA sensitivity, she is likely to benefit from leukotriene inhibitor Status asthmaticus Suspect component of VCD Note: prolonged exp phase after intubation P:   Scheduled BDs Changed solumedrol to pred on 2/5, plan slow taper Added Montelukast , continue nasal steroid >> she NEEDS to go home on these with an ASA sensitivity Add hycodan for cough Restart symbicort 2/6. Will ask case management to look into why her outpatient cost has changed at her pharmacy Defer abx at this time  CARDIOVASCULAR A: HTN, reactive Tachycardia, reactive P:  Add back Avalide on 2/6 PRN hydralazine to maintain SBP < 170 mmHg KVO IVF  RENAL A:  No acute  P:   Monitor BMET intermittently Monitor I/Os Correct electrolytes as indicated   GASTROINTESTINAL A:  Morbid obesity  GERD, chronic PPI use P:   SUP: IV PPI  Encouraging diabetic diet (she ordered take-out [place] on 2/5.Marland KitchenMarland KitchenMarland Kitchen)   HEMATOLOGIC A:  Chronic anemia, h/o FESO4 def  No evidence of bleeding   P:  DVT px: [place] heparin  Trend CBC    INFECTIOUS A: No overt infection  P:   No abx at this time  ENDOCRINE CBG (last 3)   Recent Labs  01/03/14 2349 01/04/14 0337 01/04/14 0806  GLUCAP 321* 185* 157*   A:  DM II Hyperglycemia  P:   Restart metformin Lantus and resistant SSI ordered  NEUROLOGIC A:  ICU associated agitation/discomfort  P:   D/c sedating meds  TODAY'S SUMMARY:  Asthma + VCD, extubated 2/5.   To floor on 2/6  Baltazar Apo, MD, PhD 01/04/2014, 10:03 AM [facility] [phone] or if no answer [phone]

## 2014-01-04 NOTE — Evaluation (Signed)
Physical Therapy Evaluation Patient Details Name: [name] [name] MRN: [id number] DOB: 02-Feb-1976 Today's Date: 01/04/2014 Time: 8413-2440 PT Time Calculation (min): 14 min  PT Assessment / Plan / Recommendation History of Present Illness  38 F pt of MW with difficult to control asthma complicated by what we felt to be significant upper airway component. Was just hospitalized 1/30 - 2/1 for acute asthmatic exacerbation c/b poorly controlled HTN. Admitted via ER 2/3 w/ sudden onset of resp distress, hypercarbia, severe agitation, hypertension. Failed non-invasive ventilation and intubated by PCCM in ED.  Extubated 2/5  Clinical Impression  Patient evaluated by Physical Therapy with no further acute PT needs identified. All education has been completed and the patient has no further questions. Pt ambulated in hallway without assistive device and did require rest break however reports being at her baseline.  HR did increase to 140 during ambulation however pt states it runs high. No follow-up Physial Therapy or equipment needs. PT is signing off. Thank you for this referral.     PT Assessment  Patent does not need any further PT services    Follow Up Recommendations  No PT follow up    Does the patient have the potential to tolerate intense rehabilitation      Barriers to Discharge        Equipment Recommendations  None recommended by PT    Recommendations for Other Services     Frequency      Precautions / Restrictions Precautions Precautions: Fall   Pertinent Vitals/Pain SpO2 at rest room air: 97% SpO2 room air during ambulation: 95% and HR 140 bpm SpO2 room air upon return to bed 98% and HR 130 bpm      Mobility  Bed Mobility Overal bed mobility: Modified Independent Transfers Overall transfer level: Needs assistance Equipment used: None Transfers: Sit to/from Stand Sit to Stand: Supervision Ambulation/Gait Ambulation/Gait assistance: Supervision Ambulation Distance  (Feet): 150 Feet Assistive device: None Gait Pattern/deviations: Wide base of support;Step-through pattern General Gait Details: HR up to 140 during ambulation, SpO2 on room air 95%, short rest break halfway, pt reports being at her baseline    Exercises     PT Diagnosis:    PT Problem List:   PT Treatment Interventions:       PT Goals(Current goals can be found in the care plan section) Acute Rehab PT Goals PT Goal Formulation: No goals set, d/c therapy  Visit Information  Last PT Received On: 01/04/14 Assistance Needed: +1 History of Present Illness: 38 F pt of MW with difficult to control asthma complicated by what we felt to be significant upper airway component. Was just hospitalized 1/30 - 2/1 for acute asthmatic exacerbation c/b poorly controlled HTN. Admitted via ER 2/3 w/ sudden onset of resp distress, hypercarbia, severe agitation, hypertension. Failed non-invasive ventilation and intubated by PCCM in ED.  Extubated 2/5       Prior [place] expects to be discharged to:: Private residence Living Arrangements: Children (69 year old and teenagers) Available Help at Discharge: Family Type of Home: Apartment Home Access: Elevator Home Layout: One level Home Equipment: None Prior Function Level of Independence: Independent Communication Communication: No difficulties    Cognition  Cognition Arousal/Alertness: Awake/alert Behavior During Therapy: WFL for tasks assessed/performed Overall Cognitive Status: Within Functional Limits for tasks assessed    Extremity/Trunk Assessment Lower Extremity Assessment Lower Extremity Assessment: Overall WFL for tasks assessed   Balance    End of Session PT - End of Session  Activity Tolerance: Patient tolerated treatment well Patient left: in bed;with call bell/phone within reach Nurse Communication: Mobility status  GP     Kimela Malstrom,KATHrine E 01/04/2014, 4:15 PM Carmelia Bake, PT,  DPT 01/04/2014 Pager: [phone]

## 2014-01-04 NOTE — Significant Event (Signed)
Pt c/o sore throat.  Will order chloraseptic throat spray.  Chesley Mires, MD 01/04/2014, 7:38 PM

## 2014-01-04 NOTE — Significant Event (Signed)
Pt has allergy to menthol.  Will change from chloraseptic to hurricane spray.  Chesley Mires, MD 01/04/2014, 7:55 PM

## 2014-01-04 NOTE — Progress Notes (Signed)
Pt has strong aroma coming off of her.  Discussed taking a bath and she became very angry and agitated.  Said I was rude to say she had not had one.  I informed her that we have her down as not wanting one this morning and the last couple of days.  Tried to encourage to to clean herself or let us.  This just made her angry at everything I said or asked her. Gave her meds and told I was sorry, I never ment to upset her but she ignored me.

## 2014-01-04 NOTE — Progress Notes (Signed)
Comments:  [id number] [name] Hoes RN, BSN, [place], [phone] Chart reviewed for update of needs and condition. pt extubated on [id number], tolerated well, will move to acute care floor [id number]/ Benefits check for insurance coverage of symbicort requested/Patient state that her insurance does have pharmacy coverage and copay should only be $40.00, but [facility] refuses to cover at this level.  Patient does not meet the glines for patient assistance through [employer] due to having insurance with prescription coverage.

## 2014-01-04 NOTE — Progress Notes (Signed)
[facility] ADULT ICU REPLACEMENT PROTOCOL FOR AM LAB REPLACEMENT ONLY  The patient does apply for the [facility] Adult ICU Electrolyte Replacment Protocol based on the criteria listed below:   1. Is GFR >/= 40 ml/min? yes  Patient's GFR today is >90 2. Is urine output >/= 0.5 ml/kg/hr for the last 6 hours? yes Patient's UOP is 0.9 ml/kg/hr 3. Is BUN < 60 mg/dL? yes  Patient's BUN today is 5 4. Abnormal electrolyte(s): K 3.3 5. Ordered repletion with: per protocol 6. If a panic level lab has been reported, has the CCM MD in charge been notified? no.   Physician:    Ronda Fairly A 01/04/2014 5:57 AM

## 2014-01-05 DIAGNOSIS — E876 Hypokalemia: Secondary | ICD-10-CM

## 2014-01-05 LAB — BASIC METABOLIC PANEL
BUN: 10 mg/dL (ref 6–23)
CO2: 29 mEq/L (ref 19–32)
Calcium: 8.3 mg/dL — ABNORMAL LOW (ref 8.4–10.5)
Chloride: 104 mEq/L (ref 96–112)
Creatinine, Ser: 0.63 mg/dL (ref 0.50–1.10)
GLUCOSE: 105 mg/dL — AB (ref 70–99)
Potassium: 3.3 mEq/L — ABNORMAL LOW (ref 3.7–5.3)
SODIUM: 142 meq/L (ref 137–147)

## 2014-01-05 LAB — GLUCOSE, CAPILLARY
GLUCOSE-CAPILLARY: 131 mg/dL — AB (ref 70–99)
Glucose-Capillary: 112 mg/dL — ABNORMAL HIGH (ref 70–99)
Glucose-Capillary: 132 mg/dL — ABNORMAL HIGH (ref 70–99)
Glucose-Capillary: 171 mg/dL — ABNORMAL HIGH (ref 70–99)

## 2014-01-05 MED ORDER — POTASSIUM CHLORIDE CRYS ER 20 MEQ PO TBCR
20.0000 meq | EXTENDED_RELEASE_TABLET | Freq: Every day | ORAL | Status: DC
  Administered 2014-01-05 – 2014-01-06 (×2): 20 meq via ORAL
  Filled 2014-01-05 (×2): qty 1

## 2014-01-05 NOTE — Progress Notes (Signed)
CRITICAL VALUE ALERT  Critical value received:  Gram positive rods in anaerobic bottel Date of notification: 01/05/14 Time of notification:1830  Critical value read back: Yes  Nurse who received alert: Delight Ovens  MD notified (1st page): R. Alva  Time of first page: 1830  Responding MD:  R. Elsworth Soho Time MD responded:  [id number]

## 2014-01-05 NOTE — Progress Notes (Signed)
Name: [name] [name] MRN: [id number] DOB: 1976-09-11    ADMISSION DATE:  01/01/2014 CONSULTATION DATE:  2/3 PRIMARY SERVICE: PCCM   CHIEF COMPLAINT:  Acute resp failure   BRIEF PATIENT DESCRIPTION:  46 F pt of MW with difficult to control asthma complicated by what we felt to be significant upper airway component. Was just hospitalized 1/30 - 2/1 for acute asthmatic exacerbation c/b poorly controlled HTN. Admitted via ER 2/3 w/ sudden onset of resp distress, hypercarbia, severe agitation, hypertension. Failed non-invasive ventilation and intubated by PCCM in ED  SIGNIFICANT EVENTS / STUDIES:  LE [place] 2/3 >>  No DVT TTE 2/4 >> mild LVH, EF 93-71%, diastolic dysfxn  LINES / TUBES: ETT 2/3 >> 2/5 Left CVL 2/3 >>  Radial A line rt radial 2/03 >>   CULTURES: Urine strep Ag 2/03 >> negative Resp 2/3 >> did not produce RVP 2/3 >> negative mrsa + 2/3 Urine 2/3 >> negative  ANTIBIOTICS: None   HISTORY OF PRESENT ILLNESS:   This is a 38 year old female previously f/b Wert for difficult to control asthma complicated by what we felt to be significant upper airway component. Was just d/c on 2/1 after being discharged for acute asthmatic exacerbation c/b poorly controlled HTN. Went home w/ pred taper, abx, and typical resp regimen of LABA/ICS. Developed sudden onset of acute resp distress on 2/3. Presented to the ER. Had marked upper-airway wheeze on presentation. Failed non-invasive ventilation, cont-neb and anxiolytics. Hypoxic off BIPAP. Intubated. Remained significantly bronchospastic even after intubation. Was sedated and admitted to the intensive care.   SUBJECTIVE:  Coughing, hoarseness a bit better again today. She is hoping to get home for son's birthday tomorrow.  VITAL SIGNS: Temp:  [98.3 F (36.8 C)-98.6 F (37 C)] 98.3 F (36.8 C) (02/07 6967) Pulse Rate:  [102-109] 102 (02/07 0613) Resp:  [20] 20 (02/07 0613) BP: (152-167)/(88-101) 167/101 mmHg (02/07 0613) SpO2:  [95  %-97 %] 95 % (02/07 0834) HEMODYNAMICS:   VENTILATOR SETTINGS:   INTAKE / OUTPUT: Intake/Output     02/06 0701 - 02/07 0700 02/07 0701 - 02/08 0700   P.O. 1080    I.V. (mL/kg) 50 (0.4)    Other     Total Intake(mL/kg) 1130 (8)    Urine (mL/kg/hr) 325 (0.1)    Total Output 325     Net +805            PHYSICAL EXAMINATION: General: Morbidly obese woman, passive, weak Neuro:  Awake and oriented, moves all ext HEENT:  Neck large, stridor and hoarse/ weak voice Cardiovascular:  rrr Lungs:  Distant, some referred UA exp noise. Faint, unlabored wheeze. Abdomen:  Obese  Ext: warm, no edema Skin:  striae  LABS:  CBC  Recent Labs Lab 01/01/14 1510 01/02/14 0445 01/03/14 0449  WBC 23.5* 18.5* 14.3*  HGB 10.4* 8.9* 8.4*  HCT 35.1* 32.3* 30.5*  PLT 501* 359 385   Coag's No results found for this basename: APTT, INR,  in the last 168 hours BMET  Recent Labs Lab 01/03/14 0449 01/04/14 0400 01/05/14 0501  NA 138 142 142  K 3.9 3.3* 3.3*  CL 103 106 104  CO2 26 25 29   BUN 15 19 10   CREATININE 0.70 0.72 0.63  GLUCOSE 269* 187* 105*   Electrolytes  Recent Labs Lab 01/03/14 0449 01/04/14 0400 01/05/14 0501  CALCIUM 8.0* 8.7 8.3*   Sepsis Markers  Recent Labs Lab 01/01/14 1518 01/01/14 1530 01/02/14 0455 01/03/14 0450  LATICACIDVEN  2.59*  --   --   --   PROCALCITON  --  <0.10 0.35 0.31   ABG  Recent Labs Lab 01/01/14 1745 01/02/14 1115 01/03/14 0348  PHART 7.208* 7.250* 7.397  PCO2ART 69.0* 59.8* 40.9  PO2ART 232.0* 92.8 70.8*   Liver Enzymes  Recent Labs Lab 01/01/14 1510  AST 22  ALT 20  ALKPHOS 88  BILITOT <0.2*  ALBUMIN 3.9   Cardiac Enzymes No results found for this basename: TROPONINI, PROBNP,  in the last 168 hours Glucose  Recent Labs Lab 01/04/14 0806 01/04/14 1150 01/04/14 1805 01/04/14 2142 01/05/14 0749 01/05/14 1127  GLUCAP 157* 181* 261* 141* 112* 132*   No results found.     ASSESSMENT /  PLAN:  PULMONARY A: Acute respiratory failure Chronic poorly controlled asthma  Given hx of nasal polyps and ASA sensitivity, she is likely to benefit from leukotriene inhibitor Status asthmaticus Suspect component of VCD Note: prolonged exp phase after intubation P:   Encourage mobilization Scheduled BDs Changed solumedrol to pred on 2/5, plan slow taper Added Montelukast , continue nasal steroid >> she NEEDS to go home on these with an ASA sensitivity Add hycodan for cough Restart symbicort 2/6. Will ask case management to look into why her outpatient cost has changed at her pharmacy Defer abx at this time  CARDIOVASCULAR A: HTN, reactive. Likely hard to get large enough cuff Tachycardia, reactive P:  Add back Avalide on 2/6 PRN hydralazine to maintain SBP < 170 mmHg KVO IVF  RENAL A: Hyopokalemia P:   Supplement K Monitor BMET intermittently Monitor I/Os Correct electrolytes as indicated   GASTROINTESTINAL A:  Morbid obesity  GERD, chronic PPI use P:   SUP: IV PPI  Encouraging diabetic diet (she ordered take-out [place] on 2/5.Marland KitchenMarland KitchenMarland Kitchen)   HEMATOLOGIC A:  Chronic anemia, h/o FESO4 def  No evidence of bleeding   P:  DVT px: [place] heparin  Trend CBC   INFECTIOUS A: No overt infection  P:   No abx at this time  ENDOCRINE CBG (last 3)   Recent Labs  01/04/14 2142 01/05/14 0749 01/05/14 1127  GLUCAP 141* 112* 132*   A:  DM II Hyperglycemia  P:   Restarted metformin Lantus and resistant SSI ordered  NEUROLOGIC A:  ICU associated agitation/discomfort  P:   D/c sedating meds  TODAY'S SUMMARY:  Asthma + VCD, extubated 2/5.   To floor on 2/6  CD Young, MD  01/05/2014, 1:22 PM [facility] [phone] or if no answer [phone]

## 2014-01-05 NOTE — Progress Notes (Signed)
Pt has slept well during night.  No s/s of resp distress.  Call bell in reach.  No needs expressed.

## 2014-01-06 ENCOUNTER — Telehealth: Payer: Self-pay | Admitting: Adult Health

## 2014-01-06 DIAGNOSIS — I10 Essential (primary) hypertension: Secondary | ICD-10-CM

## 2014-01-06 LAB — BASIC METABOLIC PANEL
BUN: 9 mg/dL (ref 6–23)
CALCIUM: 8.3 mg/dL — AB (ref 8.4–10.5)
CO2: 28 mEq/L (ref 19–32)
Chloride: 103 mEq/L (ref 96–112)
Creatinine, Ser: 0.61 mg/dL (ref 0.50–1.10)
GFR calc Af Amer: >90 mL/min (ref >90)
Glucose, Bld: 91 mg/dL (ref 70–99)
Potassium: 3.1 mEq/L — ABNORMAL LOW (ref 3.7–5.3)
SODIUM: 142 meq/L (ref 137–147)

## 2014-01-06 LAB — GLUCOSE, CAPILLARY
GLUCOSE-CAPILLARY: 92 mg/dL (ref 70–99)
Glucose-Capillary: 118 mg/dL — ABNORMAL HIGH (ref 70–99)

## 2014-01-06 LAB — CULTURE, BLOOD (ROUTINE X 2)

## 2014-01-06 MED ORDER — PREDNISONE 20 MG PO TABS: ORAL_TABLET | ORAL | Status: AC

## 2014-01-06 MED ORDER — POTASSIUM CHLORIDE CRYS ER 20 MEQ PO TBCR
40.0000 meq | EXTENDED_RELEASE_TABLET | Freq: Once | ORAL | Status: AC
  Administered 2014-01-06: 40 meq via ORAL
  Filled 2014-01-06: qty 2

## 2014-01-06 MED ORDER — MONTELUKAST SODIUM 5 MG PO CHEW: 10.0000 mg | CHEWABLE_TABLET | Freq: Every day | ORAL | Status: AC

## 2014-01-06 NOTE — Progress Notes (Signed)
Discharge instructions explained to pt, she states understanding. Prescriptions called into [facility] on [address].

## 2014-01-06 NOTE — Progress Notes (Signed)
Pt slept all night with difficulty.  Start of shift she had some shortness of breath.  Resp treatment given with good results.  No c/o pain or distress noted during night.

## 2014-01-06 NOTE — Plan of Care (Signed)
Problem: Phase III Progression Outcomes Goal: Nutritional plan in place Outcome: Completed/Met Date Met:  01/06/14 Carb modified

## 2014-01-06 NOTE — Telephone Encounter (Signed)
Pt needs appointment for hosp f/u  with MW or TP in 7-10 days with BMET.  (discharged 2/8)

## 2014-01-06 NOTE — Progress Notes (Signed)
Name: [name] [name] MRN: [id number] DOB: 1976-09-05    ADMISSION DATE:  01/01/2014 CONSULTATION DATE:  2/3 PRIMARY SERVICE: PCCM   CHIEF COMPLAINT:  Acute resp failure   BRIEF PATIENT DESCRIPTION:  67 F pt of MW with difficult to control asthma complicated by what we felt to be significant upper airway component. Was just hospitalized 1/30 - 2/1 for acute asthmatic exacerbation c/b poorly controlled HTN. Admitted via ER 2/3 w/ sudden onset of resp distress, hypercarbia, severe agitation, hypertension. Failed non-invasive ventilation and intubated by PCCM in ED  SIGNIFICANT EVENTS / STUDIES:  LE [place] 2/3 >>  No DVT TTE 2/4 >> mild LVH, EF 99-37%, diastolic dysfxn  LINES / TUBES: ETT 2/3 >> 2/5 Left CVL 2/3 >>  Radial A line rt radial 2/03 >> out  CULTURES: Urine strep Ag 2/03 >> negative Resp 2/3 >> did not produce RVP 2/3 >> negative mrsa + 2/3 Urine 2/3 >> negative Blood 2/3 GNR 1 bottle c/w contaminant  ANTIBIOTICS: None   HISTORY OF PRESENT ILLNESS:   This is a 38 year old female previously f/b Wert for difficult to control asthma complicated by what we felt to be significant upper airway component. Was just d/c on 2/1 after being discharged for acute asthmatic exacerbation c/b poorly controlled HTN. Went home w/ pred taper, abx, and typical resp regimen of LABA/ICS. Developed sudden onset of acute resp distress on 2/3. Presented to the ER. Had marked upper-airway wheeze on presentation. Failed non-invasive ventilation, cont-neb and anxiolytics. Hypoxic off BIPAP. Intubated. Remained significantly bronchospastic even after intubation. Was sedated and admitted to the intensive care.   SUBJECTIVE:  Much improved. Up in chair. Chest clear, voice normal. Eager to get home.  VITAL SIGNS: Temp:  [97.7 F (36.5 C)-98.8 F (37.1 C)] 97.7 F (36.5 C) (02/08 0527) Pulse Rate:  [94-108] 94 (02/08 0527) Resp:  [18-20] 18 (02/08 0527) BP: (162-169)/(89-109) 169/109 mmHg (02/08  0527) SpO2:  [95 %-98 %] 98 % (02/08 0833) Weight:  [299 lb 6.2 oz (135.8 kg)] 299 lb 6.2 oz (135.8 kg) (02/08 0527) HEMODYNAMICS:   VENTILATOR SETTINGS:   INTAKE / OUTPUT: Intake/Output     02/07 0701 - 02/08 0700 02/08 0701 - 02/09 0700   P.O. 360 300   I.V. (mL/kg)     Total Intake(mL/kg) 360 (2.7) 300 (2.2)   Urine (mL/kg/hr)  1 (0)   Total Output   1   Net +360 +299          PHYSICAL EXAMINATION: General: Morbidly obese woman, much alert and engaged today Neuro:  Awake and oriented, moves all ext HEENT:  Neck large, no strider, voice clear, no thrush Cardiovascular:  rrr Lungs: Clear, unlabored, room air Abdomen:  Obese  Ext: warm, no edema Skin:  striae  LABS:  CBC  Recent Labs Lab 01/01/14 1510 01/02/14 0445 01/03/14 0449  WBC 23.5* 18.5* 14.3*  HGB 10.4* 8.9* 8.4*  HCT 35.1* 32.3* 30.5*  PLT 501* 359 385   Coag's No results found for this basename: APTT, INR,  in the last 168 hours BMET  Recent Labs Lab 01/04/14 0400 01/05/14 0501 01/06/14 0600  NA 142 142 142  K 3.3* 3.3* 3.1*  CL 106 104 103  CO2 25 29 28   BUN 19 10 9   CREATININE 0.72 0.63 0.61  GLUCOSE 187* 105* 91   Electrolytes  Recent Labs Lab 01/04/14 0400 01/05/14 0501 01/06/14 0600  CALCIUM 8.7 8.3* 8.3*   Sepsis Markers  Recent Labs  Lab 01/01/14 1518 01/01/14 1530 01/02/14 0455 01/03/14 0450  LATICACIDVEN 2.59*  --   --   --   PROCALCITON  --  <0.10 0.35 0.31   ABG  Recent Labs Lab 01/01/14 1745 01/02/14 1115 01/03/14 0348  PHART 7.208* 7.250* 7.397  PCO2ART 69.0* 59.8* 40.9  PO2ART 232.0* 92.8 70.8*   Liver Enzymes  Recent Labs Lab 01/01/14 1510  AST 22  ALT 20  ALKPHOS 88  BILITOT <0.2*  ALBUMIN 3.9   Cardiac Enzymes No results found for this basename: TROPONINI, PROBNP,  in the last 168 hours Glucose  Recent Labs Lab 01/04/14 2142 01/05/14 0749 01/05/14 1127 01/05/14 1643 01/05/14 2146 01/06/14 0729  GLUCAP 141* 112* 132* 171* 131*  92   No results found.     ASSESSMENT / PLAN:  PULMONARY A: Acute respiratory failure Chronic poorly controlled asthma  Given hx of nasal polyps and ASA sensitivity, she is now on singulair Status asthmaticus Suspect component of VCD Note: prolonged exp phase after intubation P:   Encourage mobilization Scheduled BDs Changed solumedrol to pred on 2/5, plan slow taper Added Montelukast , continue nasal steroid >> she NEEDS to go home on these with an ASA sensitivity Add hycodan for cough Restart symbicort 2/6. Will ask case management to look into why her outpatient cost has changed at her pharmacy Defer abx at this time  CARDIOVASCULAR A: HTN, reactive. Likely hard to get large enough cuff Tachycardia, reactive P:  Add back Avalide on 2/6 PRN hydralazine to maintain SBP < 170 mmHg KVO IVF  RENAL A: Hyopokalemia P:   Supplement K-  Go home on 20 bid  Monitor BMET intermittently Monitor I/Os Correct electrolytes as indicated   GASTROINTESTINAL A:  Morbid obesity  GERD, chronic PPI use P:   SUP: IV PPI  Encouraging diabetic diet (she ordered take-out [place] on 2/5.Marland KitchenMarland KitchenMarland Kitchen)   HEMATOLOGIC A:  Chronic anemia, h/o FESO4 def  No evidence of bleeding   P:  DVT px: [place] heparin  Trend CBC   INFECTIOUS A: No overt infection  P:   No abx at this time  ENDOCRINE CBG (last 3)   Recent Labs  01/05/14 1643 01/05/14 2146 01/06/14 0729  GLUCAP 171* 131* 92   A:  DM II- no Rx listed on admission home meds Hyperglycemia  P:   Restarted metformin- may be sufficient as steroids taper Lantus and resistant SSI ordered in hosp  NEUROLOGIC A:  ICU associated agitation/discomfort  P:   D/c sedating meds  TODAY'S SUMMARY:  Asthma + VCD, extubated 2/5. Can return to outpt f/u Dr Melvyn Novas NP 1-2 weeks. Resume home meds. Prednisone taper to 20 mg daily till seen. K supplement- her prior Rx or [place] 20 meq daily Diabetic diet.   To floor on 2/6  CD Young, MD   01/06/2014, 10:45 AM [facility] [phone] or if no answer [phone]

## 2014-01-06 NOTE — Discharge Summary (Signed)
Physician Discharge Summary  Patient ID: Eriona Kinchen MRN: [id number] DOB/AGE: [birth year] 38 y.o.  Admit date: 01/01/2014 Discharge date: 01/06/2014    Discharge Diagnoses:  Principal Problem:   Asthma with status asthmaticus Active Problems:   Morbid obesity   GERD (gastroesophageal reflux disease)   Acute respiratory failure with hypoxia   Diabetes mellitus    Brief Summary: [name] [name] is a 38 year old female previously f/b Wert for difficult to control asthma complicated by what we felt to be significant upper airway component. Was just d/c on 2/1 after being discharged for acute asthmatic exacerbation c/b poorly controlled HTN. Went home w/ pred taper, abx, and typical resp regimen of LABA/ICS. Developed sudden onset of acute resp distress on 2/3. Presented to the ER. Had marked upper-airway wheeze on presentation. Failed non-invasive ventilation, cont-neb and anxiolytics. Hypoxic off BIPAP. Intubated. Remained significantly bronchospastic even after intubation. Was sedated and admitted to the intensive care.  Treated with BD, steroids.  She self extubated 2/6 and resp status remained stable with much improved bronchospasm although voice remained significantly hoarse.  She was transitioned to po steroids, home BD's, singulair and nasal steroid.  She is currently much improved, hoarseness improved, on RA, mobilizing and ready for d/c home.     SIGNIFICANT EVENTS / STUDIES:  LE [place] 2/3 >> No DVT  TTE 2/4 >> mild LVH, EF 27-51%, diastolic dysfxn   LINES / TUBES:  ETT 2/3 >> 2/5  Left CVL 2/3 >>  Radial A line rt radial 2/03 >> out   CULTURES:  Urine strep Ag 2/03 >> negative  Resp 2/3 >> did not produce  RVP 2/3 >> negative  mrsa + 2/3  Urine 2/3 >> negative  Blood 2/3 GNR 1 bottle c/w contaminant   ANTIBIOTICS:  None                                                                     Hospital Summary by Discharge Diagnosis    Acute respiratory failure  Chronic  poorly controlled asthma  Status asthmaticus  Suspect component of VCD   P:  Given hx of nasal polyps and ASA sensitivity, she is now on singulair  Encourage mobilization  Resume home rx BD's Continue prednisone with slow taper to 40m daily until seen as outpt  Added Montelukast , continue nasal steroid >> she NEEDS to go home on these with an ASA sensitivity    HTN, reactive.  Tachycardia, reactive  P:  Resume home rx at d/c  Hyopokalemia  P:  Continue home K supp Will need chem at outpt f/u visit    DM II- no Rx listed on admission home meds  Hyperglycemia  P:  Metformin on d/c - should be sufficient as steroids taper  Encouraged diabetic diet    Filed Vitals:   01/05/14 2150 01/06/14 0517 01/06/14 0527 01/06/14 0833  BP: 162/101  169/109   Pulse: 107  94   Temp: 97.9 F (36.6 C)  97.7 F (36.5 C)   TempSrc: Oral  Oral   Resp: 18  18   Height:      Weight:   299 lb 6.2 oz (135.8 kg)   SpO2: 97% 95% 95% 98%     Discharge Labs  BMET  Recent Labs Lab 01/02/14 0445 01/03/14 0449 01/04/14 0400 01/05/14 0501 01/06/14 0600  NA 135* 138 142 142 142  K 4.8 3.9 3.3* 3.3* 3.1*  CL 98 103 106 104 103  CO2 25 26 25 29 28   GLUCOSE 312* 269* 187* 105* 91  BUN 16 15 19 10 9   CREATININE 0.81 0.70 0.72 0.63 0.61  CALCIUM 7.6* 8.0* 8.7 8.3* 8.3*     CBC   Recent Labs Lab 01/01/14 1510 01/02/14 0445 01/03/14 0449  HGB 10.4* 8.9* 8.4*  HCT 35.1* 32.3* 30.5*  WBC 23.5* 18.5* 14.3*  PLT 501* 359 385   Anti-Coagulation No results found for this basename: INR,  in the last 168 hours        Follow-up Information   Follow up with Christinia Gully, MD. (office will call you for appt in 1-2 weeks)    Specialty:  Pulmonary Disease   Contact information:   [address] [address] [phone]          Medication List    STOP taking these medications       levofloxacin 750 MG tablet  Commonly known as:  LEVAQUIN      TAKE these  medications       albuterol 108 (90 BASE) MCG/ACT inhaler  Commonly known as:  PROVENTIL HFA;VENTOLIN HFA  Inhale 2 puffs into the lungs every 4 (four) hours as needed for wheezing or shortness of breath.     budesonide-formoterol 160-4.5 MCG/ACT inhaler  Commonly known as:  SYMBICORT  Inhale 2 puffs into the lungs 2 (two) times daily.     diphenhydrAMINE 25 mg capsule  Commonly known as:  BENADRYL  Take 25 mg by mouth daily as needed for allergies.     ferrous gluconate 324 MG tablet  Commonly known as:  FERGON  Take 1 tablet (324 mg total) by mouth 3 (three) times daily with meals.     fluticasone 50 MCG/ACT nasal spray  Commonly known as:  FLONASE  Place 2 sprays into the nose daily.     folic acid 998 MCG tablet  Commonly known as:  FOLVITE  Take 400 mcg by mouth every morning.     ipratropium-albuterol 0.5-2.5 (3) MG/3ML Soln  Commonly known as:  DUONEB  Take 3 mLs by nebulization every 4 (four) hours as needed (for wheezing or shortness of breath).     irbesartan-hydrochlorothiazide 300-12.5 MG per tablet  Commonly known as:  AVALIDE  Take 1 tablet by mouth daily.     metFORMIN 500 MG tablet  Commonly known as:  GLUCOPHAGE  Take 2 tablets (1,000 mg total) by mouth 2 (two) times daily with a meal. Take 1 tablet twice daily for 7 days then increase to 2 tablet twice daily.     montelukast 5 MG chewable tablet  Commonly known as:  SINGULAIR  Chew 2 tablets (10 mg total) by mouth at bedtime.     pantoprazole 40 MG tablet  Commonly known as:  PROTONIX  Take 1 tablet (40 mg total) by mouth daily. Take 30-60 min before first meal of the day     Potassium 95 MG Tabs  Take 1 tablet by mouth daily.     predniSONE 20 MG tablet  Commonly known as:  DELTASONE  2 tabs po daily x 3 days then 1.5 tabs po daily x 3 days then 1 tab po daily until seen by pulmonary.  Disposition: 01-Home or Self Care  Discharged Condition: Allycia Pitz has met maximum  benefit of inpatient care and is medically stable and cleared for discharge.  Patient is pending follow up as above.      Time spent on disposition:  Greater than 35 minutes.   SignedDarlina Sicilian, NP 01/06/2014  1:27 PM Pager: [phone] or [phone]  *Care during the described time interval was provided by me and/or other providers on the critical care team. I have reviewed this patient's available data, including medical history, events of note, physical examination and test results as part of my evaluation.

## 2014-01-06 NOTE — Discharge Summary (Signed)
CD Annamaria Boots, MD PCCM 01/06/14

## 2014-01-07 LAB — CULTURE, BLOOD (ROUTINE X 2): CULTURE: NO GROWTH

## 2014-01-07 NOTE — Telephone Encounter (Signed)
Appt set for MW 01-14-14 at 11:15. BMET ordered. Pt is aware. [place] Bing, CMA

## 2014-01-14 ENCOUNTER — Inpatient Hospital Stay: Payer: Self-pay | Admitting: Internal Medicine

## 2014-02-05 ENCOUNTER — Encounter: Payer: Self-pay | Admitting: Internal Medicine

## 2014-02-08 ENCOUNTER — Emergency Department (HOSPITAL_COMMUNITY)
Admission: EM | Admit: 2014-02-08 | Discharge: 2014-02-27 | Disposition: E | Payer: Medicare Other | Attending: Emergency Medicine | Admitting: Emergency Medicine

## 2014-02-08 DIAGNOSIS — J9690 Respiratory failure, unspecified, unspecified whether with hypoxia or hypercapnia: Secondary | ICD-10-CM

## 2014-02-08 DIAGNOSIS — Z87448 Personal history of other diseases of urinary system: Secondary | ICD-10-CM | POA: Insufficient documentation

## 2014-02-08 DIAGNOSIS — D649 Anemia, unspecified: Secondary | ICD-10-CM | POA: Insufficient documentation

## 2014-02-08 DIAGNOSIS — J9600 Acute respiratory failure, unspecified whether with hypoxia or hypercapnia: Secondary | ICD-10-CM | POA: Insufficient documentation

## 2014-02-08 DIAGNOSIS — E119 Type 2 diabetes mellitus without complications: Secondary | ICD-10-CM | POA: Insufficient documentation

## 2014-02-08 DIAGNOSIS — J45902 Unspecified asthma with status asthmaticus: Secondary | ICD-10-CM | POA: Insufficient documentation

## 2014-02-08 DIAGNOSIS — I10 Essential (primary) hypertension: Secondary | ICD-10-CM | POA: Insufficient documentation

## 2014-02-08 DIAGNOSIS — K219 Gastro-esophageal reflux disease without esophagitis: Secondary | ICD-10-CM | POA: Insufficient documentation

## 2014-02-08 DIAGNOSIS — IMO0002 Reserved for concepts with insufficient information to code with codable children: Secondary | ICD-10-CM | POA: Insufficient documentation

## 2014-02-08 DIAGNOSIS — Z8701 Personal history of pneumonia (recurrent): Secondary | ICD-10-CM | POA: Insufficient documentation

## 2014-02-08 DIAGNOSIS — E669 Obesity, unspecified: Secondary | ICD-10-CM | POA: Insufficient documentation

## 2014-02-08 DIAGNOSIS — Z79899 Other long term (current) drug therapy: Secondary | ICD-10-CM | POA: Insufficient documentation

## 2014-02-08 DIAGNOSIS — Z8659 Personal history of other mental and behavioral disorders: Secondary | ICD-10-CM | POA: Insufficient documentation

## 2014-02-08 MED FILL — Medication: Qty: 1 | Status: AC

## 2014-02-11 ENCOUNTER — Telehealth (HOSPITAL_BASED_OUTPATIENT_CLINIC_OR_DEPARTMENT_OTHER): Payer: Self-pay

## 2014-02-27 DIAGNOSIS — 419620001 Death: Secondary | SNOMED CT

## 2014-02-27 NOTE — ED Notes (Signed)
[facility] called and notified about pt death. Reference number assigned: [id number] per Luanna Cole at [facility]

## 2014-02-27 NOTE — ED Notes (Signed)
Unable to complete triage/questions due to patient's condition

## 2014-02-27 NOTE — Progress Notes (Addendum)
After pt's death, chaplain provided support with family during conference with MD and at bedside.   Pt's children (23,16,13) and pt's sister (40) present.  Consulted with SW to provide family with bus passes, as they were dropped off at ED by co-worker.   Will continue to follow for grief support.   Pt's daughter:  Giulietta Prokop (00)  [phone]  Pt's sister:  Jisselle Poth (91)  [phone]    Brett Albino, [place]

## 2014-02-27 NOTE — ED Notes (Signed)
Bed: RESA Expected date:  Expected time:  Means of arrival:  Comments: EMS-SOB 

## 2014-02-27 NOTE — ED Notes (Signed)
Pt family at bedside with  [facility].

## 2014-02-27 NOTE — ED Notes (Addendum)
Patient arrived to Res A 12:08 pm via EMS-pt was being bagged and paced by EMS. EMS placed I/O in pt R lower leg. Upon placing pt on monitor, pt had no shockable rhythm and showed asystole on monitor. CPR initiated per [place], EDP order. Pt was administered per [place], EDP order: 12:13 pm 1 mg Epi, 50 meq Na+ Bicarb 12:19 pm 1 mg Epi 12:22 pm 1 Epi, 50 meq Na+ Bicarb 12:24 pm 13.6 meq Ca++ Chloride 12:32 pm 2 mg Mag 12:33 pm 1 mg Epi 12:35 pm 1 mg Epi 12:37 Dr. Doy Mince noted pt was in PEA  12:43 pm 1 mg Epi  12:45 Time of death, per Dr. Doy Mince Pt never achieved shockable rhythm during code, CPR was performed throughout code and only paused while checking for pulse per EDP.

## 2014-02-27 NOTE — ED Provider Notes (Signed)
CSN: [id number]     Arrival date & time 02-22-14  1207 History   First MD Initiated Contact with Patient 2014-02-22 1248     Chief Complaint  Patient presents with  . Shortness of Breath     (Consider location/radiation/quality/duration/timing/severity/associated sxs/prior Treatment) HPI Comments: 38 year old female with a history of severe asthma presented via EMS with respiratory distress.  Per EMS report, she was complaining of severe shortness of breath consistent with prior asthma exacerbations.  She deteriorated during transport and was started on bag-valve-mask ventilation after intubation failed. No other significant history available.     Level V Caveat.     Past Medical History  Diagnosis Date  . Asthma   . Hypertension   . Chronic anemia   . Abnormal TSH   . Morbid obesity   . GERD (gastroesophageal reflux disease)   . Seasonal allergies   . Nasal polyps   . Diabetes mellitus     type 2  . Chronic bronchitis   . Sleep apnea   . Shortness of breath 12/14/11    "related to how bad my asthma is; sometimes @ rest, lying down, w/exertion"  . Pneumonia   . Blood transfusion   . Inguinal hernia unilateral, non-recurrent     right  . Kidney infection 2008  . Headache(784.0)   . Anxiety   . Depression   . PTSD (post-traumatic stress disorder)     "related to prior hospitalizations; things that happened when I was in hospital"  . Asthma    Past Surgical History  Procedure Laterality Date  . Cervical cerclage  1999; 2002  . Tubal ligation  2002  . Cervical cerclage    . Tubal ligation     Family History  Problem Relation Age of Onset  . Diabetes Mother   . Heart disease Father   . Heart disease Sister    History  Substance Use Topics  . Smoking status: Never Smoker   . Smokeless tobacco: Never Used  . Alcohol Use: No     Comment: 12/14/11 "only on birthdays; parties, and such"   OB History   Grav Para Term Preterm Abortions TAB SAB Ect Mult Living                  Review of Systems  Unable to perform ROS: Patient unresponsive      Allergies  Aspirin; Aspirin; Ibuprofen; Shellfish allergy; Menthol; and Other  Home Medications   Current Outpatient Rx  Name  Route  Sig  Dispense  Refill  . albuterol (PROVENTIL HFA;VENTOLIN HFA) 108 (90 BASE) MCG/ACT inhaler   Inhalation   Inhale 2 puffs into the lungs every 4 (four) hours as needed for wheezing or shortness of breath.   1 Inhaler   11   . budesonide-formoterol (SYMBICORT) 160-4.5 MCG/ACT inhaler   Inhalation   Inhale 2 puffs into the lungs 2 (two) times daily.   1 Inhaler   1   . diphenhydrAMINE (BENADRYL) 25 mg capsule   Oral   Take 25 mg by mouth daily as needed for allergies.          . ferrous gluconate (FERGON) 324 MG tablet   Oral   Take 1 tablet (324 mg total) by mouth 3 (three) times daily with meals.   90 tablet   3   . fluticasone (FLONASE) 50 MCG/ACT nasal spray   Nasal   Place 2 sprays into the nose daily.   16 g   2   .  folic acid (FOLVITE) [id number] MCG tablet   Oral   Take 400 mcg by mouth every morning.          Marland Kitchen ipratropium-albuterol (DUONEB) 0.5-2.5 (3) MG/3ML SOLN   Nebulization   Take 3 mLs by nebulization every 4 (four) hours as needed (for wheezing or shortness of breath).   360 mL   11   . irbesartan-hydrochlorothiazide (AVALIDE) 300-12.5 MG per tablet   Oral   Take 1 tablet by mouth daily.   30 tablet   1   . metFORMIN (GLUCOPHAGE) 500 MG tablet   Oral   Take 2 tablets (1,000 mg total) by mouth 2 (two) times daily with a meal. Take 1 tablet twice daily for 7 days then increase to 2 tablet twice daily.   120 tablet   2   . montelukast (SINGULAIR) 5 MG chewable tablet   Oral   Chew 2 tablets (10 mg total) by mouth at bedtime.   60 tablet   0   . pantoprazole (PROTONIX) 40 MG tablet   Oral   Take 1 tablet (40 mg total) by mouth daily. Take 30-60 min before first meal of the day   30 tablet   2   . Potassium 95 MG TABS    Oral   Take 1 tablet by mouth daily.         . predniSONE (DELTASONE) 20 MG tablet      2 tabs po daily x 3 days then 1.5 tabs po daily x 3 days then 1 tab po daily until seen by pulmonary.   30 tablet   0    There were no vitals taken for this visit. Physical Exam  Nursing note and vitals reviewed. Constitutional: She appears well-developed and well-nourished.  obese  HENT:  Head: Normocephalic and atraumatic.  Mouth/Throat: Oropharynx is clear and moist.  OPA and vomitus in oropharynx.  Eyes: Right pupil is not reactive. Left pupil is not reactive.  Neck: Neck supple.  Cardiovascular: Normal heart sounds and intact distal pulses.   Unable to appreciate heart sounds over mechanical ventilation  Pulmonary/Chest: Effort normal. She has decreased breath sounds (poor air movement. ).  Mechanical breath sounds with minimal air movement.   Abdominal: Soft. Bowel sounds are normal. She exhibits no distension. There is no tenderness.  Musculoskeletal: Normal range of motion.  Neurological: She is unresponsive. GCS eye subscore is 1. GCS verbal subscore is 1. GCS motor subscore is 1.  Skin: Skin is warm and dry. No rash noted.  Psychiatric:  Unable to test    ED Course  INTUBATION Date/Time: 2014-02-12 1:42 PM Performed by: Serita Grit DAVID III Authorized by: Serita Grit DAVID III Consent: The procedure was performed in an emergent situation. Indications: respiratory failure Intubation method: video-assisted Patient status: unconscious Laryngoscope size: glidescope 4. Tube size: 7.5 mm Tube type: cuffed Number of attempts: 1 Cords visualized: yes Post-procedure assessment: chest rise and CO2 detector Breath sounds: equal Cuff inflated: yes Tube secured with: ETT holder   Cardiopulmonary Resuscitation (CPR) Procedure Note Directed/Performed by: Serita Grit DAVID III I personally directed ancillary staff and/or performed CPR in an effort to regain return of  spontaneous circulation and to maintain cardiac, neuro and systemic perfusion.     (including critical care time) Labs Review Labs Reviewed - No data to display Imaging Review No results found.   EKG Interpretation None      MDM   Final diagnoses:  Status asthmaticus  Respiratory failure  Death  38 yo female with hx of severe asthma (requiring recent intubation and mechanical ventilation) presenting unresponsive after EMS called for respiratory distress.  On arrival, she had very faint pulse, which she quickly lost.  CPR initiated.  Intubated by me with Glidescope without any difficulty.  Multiple rounds of epi, calcium, bicarb, magnesium given.  She briefly regained a faint pulse, but bedside echo at that time showed very limited cardiac activity, so CPR was resumed.  CPR was continued for greater than 30 minutes without significant improvement in status.  Bedside echo repeated and showed cardiac standstill and monitor showed asystole.  All further resuscitative efforts were felt to be futile and she was pronounced dead.    I spoke with family on the phone who stated they were unable to come to the ED.  Her daughter, [name] [name], who identified herself as closest relative was therefore updated via telephone and advised to come to the ED as soon as possible.      Houston Siren III, MD 02-15-14 [phone]

## 2014-02-27 NOTE — ED Notes (Signed)
Per EMS-asthma troubles started yesterday-rescue inhaler not working-wheezing, labored breathing when EMS arrived-started 5 mg albuterol neb, gave another 5 mg with 5 mg atrovent-patient became bradycardic-started bagging patient and pacing patient-attempted intubation but not successful-125 mg of solumedrol given IO-

## 2014-02-27 NOTE — ED Notes (Signed)
Patient has belongings in purple backpack at bedside. 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

## 2014-02-27 DEATH — deceased

## 2015-02-01 IMAGING — CR DG CHEST 1V PORT
1 series · 1 of 1 positions shown · non-contrast
Comparison: 03/15/2013.

CLINICAL DATA: Chest pain.  Asthma attack.

PORTABLE CHEST - 1 VIEW

[view not recorded]
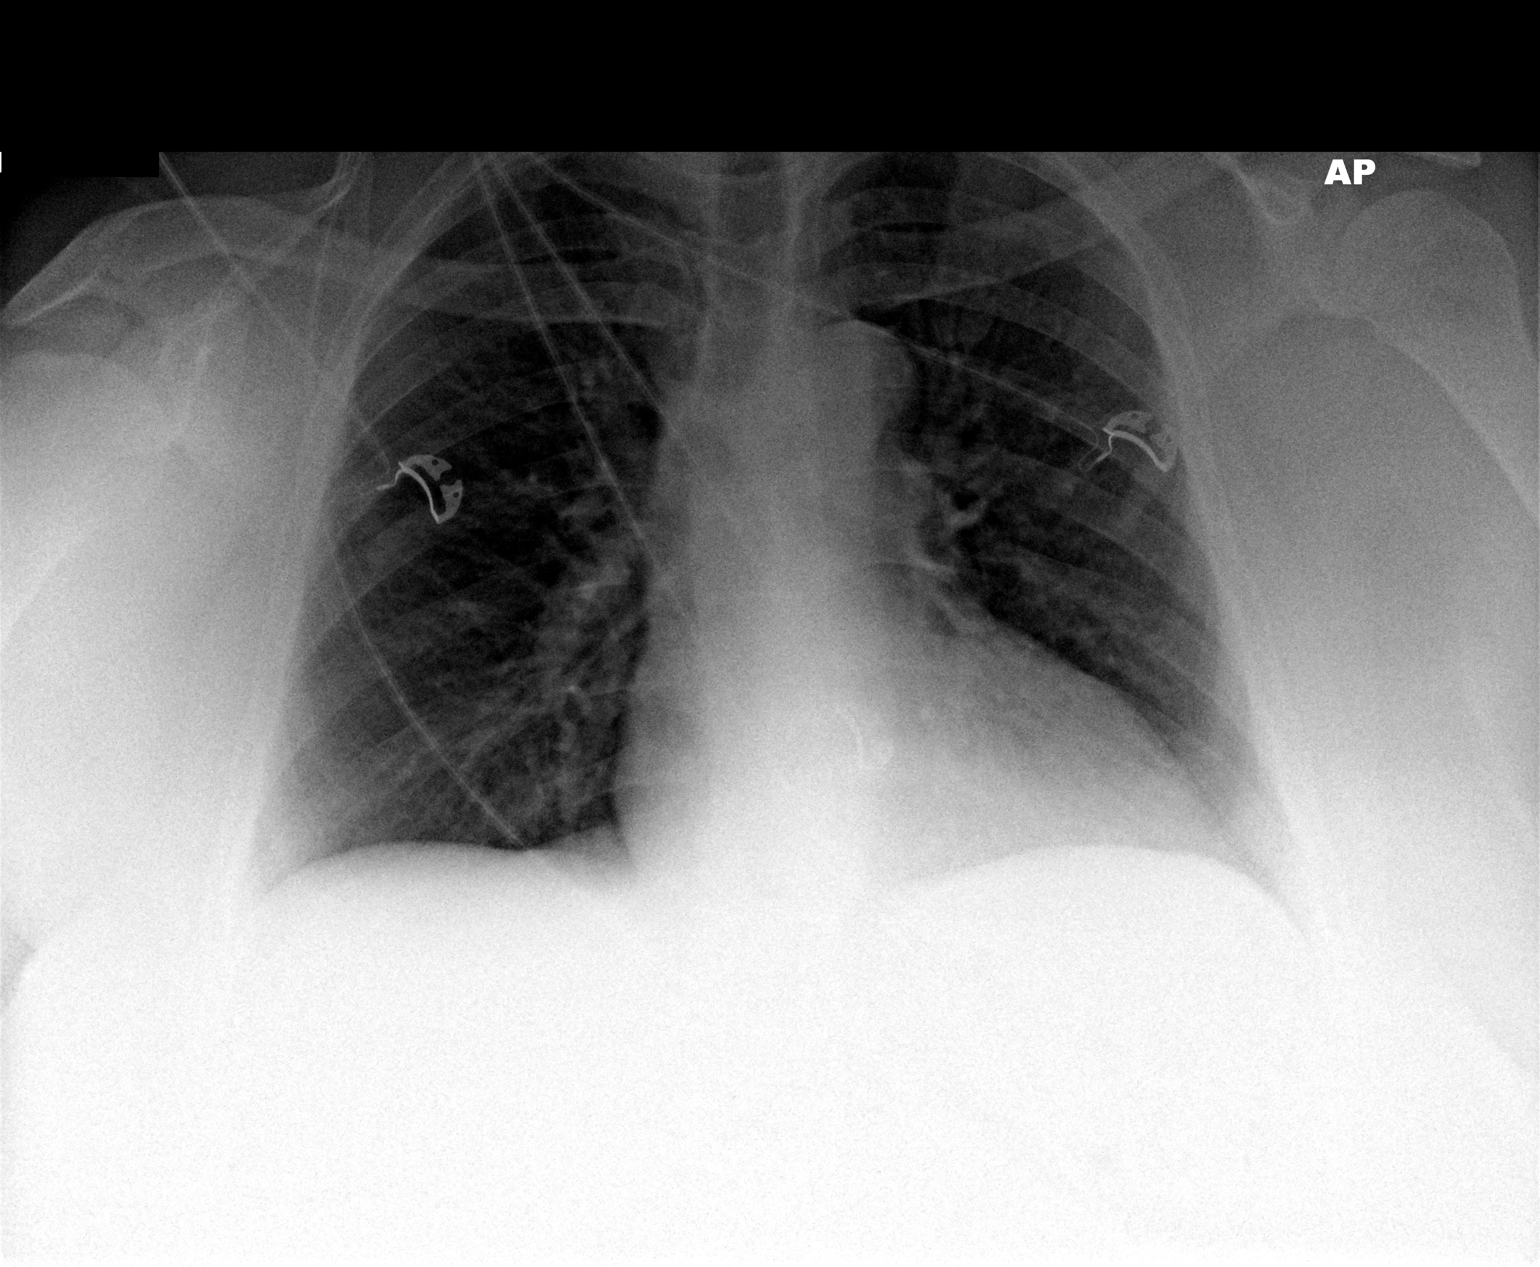

[1 of 1 positions shown; findings below may reference images not displayed]

FINDINGS: The cardiac silhouette, mediastinal and hilar contours
are stable.  The lungs demonstrate mild peribronchial thickening
and increased interstitial markings which could reflect bronchitis
or reactive airways disease.  No focal infiltrates, edema or
effusion.  The bony thorax is intact.
IMPRESSION: Findings suggest bronchitis or reactive airways disease.  No focal
infiltrate.
